# Patient Record
Sex: Male | Born: 1960 | Race: Black or African American | Hispanic: No | Marital: Single | Smoking: Current some day smoker
Health system: Southern US, Community
[De-identification: ages and names within clinical notes are randomized; demographics above are authoritative.]

## PROBLEM LIST (undated history)

## (undated) DIAGNOSIS — K219 Gastro-esophageal reflux disease without esophagitis: Secondary | ICD-10-CM

## (undated) DIAGNOSIS — Q2112 Patent foramen ovale: Secondary | ICD-10-CM

## (undated) DIAGNOSIS — J449 Chronic obstructive pulmonary disease, unspecified: Secondary | ICD-10-CM

## (undated) DIAGNOSIS — R296 Repeated falls: Secondary | ICD-10-CM

## (undated) DIAGNOSIS — F329 Major depressive disorder, single episode, unspecified: Secondary | ICD-10-CM

## (undated) DIAGNOSIS — M51369 Other intervertebral disc degeneration, lumbar region without mention of lumbar back pain or lower extremity pain: Secondary | ICD-10-CM

## (undated) DIAGNOSIS — M7122 Synovial cyst of popliteal space [Baker], left knee: Secondary | ICD-10-CM

## (undated) DIAGNOSIS — R768 Other specified abnormal immunological findings in serum: Secondary | ICD-10-CM

## (undated) DIAGNOSIS — R4182 Altered mental status, unspecified: Secondary | ICD-10-CM

## (undated) DIAGNOSIS — N281 Cyst of kidney, acquired: Secondary | ICD-10-CM

## (undated) DIAGNOSIS — G40909 Epilepsy, unspecified, not intractable, without status epilepticus: Secondary | ICD-10-CM

## (undated) DIAGNOSIS — D696 Thrombocytopenia, unspecified: Secondary | ICD-10-CM

## (undated) DIAGNOSIS — B1920 Unspecified viral hepatitis C without hepatic coma: Secondary | ICD-10-CM

## (undated) DIAGNOSIS — E039 Hypothyroidism, unspecified: Secondary | ICD-10-CM

## (undated) DIAGNOSIS — Z720 Tobacco use: Secondary | ICD-10-CM

## (undated) DIAGNOSIS — Q211 Atrial septal defect: Secondary | ICD-10-CM

## (undated) DIAGNOSIS — I63512 Cerebral infarction due to unspecified occlusion or stenosis of left middle cerebral artery: Secondary | ICD-10-CM

## (undated) DIAGNOSIS — Z9049 Acquired absence of other specified parts of digestive tract: Secondary | ICD-10-CM

## (undated) DIAGNOSIS — N2889 Other specified disorders of kidney and ureter: Secondary | ICD-10-CM

## (undated) DIAGNOSIS — Q909 Down syndrome, unspecified: Secondary | ICD-10-CM

## (undated) DIAGNOSIS — Z9889 Other specified postprocedural states: Secondary | ICD-10-CM

## (undated) DIAGNOSIS — I253 Aneurysm of heart: Secondary | ICD-10-CM

## (undated) DIAGNOSIS — M47816 Spondylosis without myelopathy or radiculopathy, lumbar region: Secondary | ICD-10-CM

## (undated) DIAGNOSIS — E559 Vitamin D deficiency, unspecified: Secondary | ICD-10-CM

## (undated) DIAGNOSIS — M5136 Other intervertebral disc degeneration, lumbar region: Secondary | ICD-10-CM

## (undated) DIAGNOSIS — R112 Nausea with vomiting, unspecified: Secondary | ICD-10-CM

## (undated) HISTORY — DX: Other specified abnormal immunological findings in serum: R76.8

## (undated) HISTORY — DX: Aneurysm of heart: I25.3

## (undated) HISTORY — DX: Atrial septal defect: Q21.1

## (undated) HISTORY — DX: Chronic obstructive pulmonary disease, unspecified: J44.9

## (undated) HISTORY — DX: Altered mental status, unspecified: R41.82

## (undated) HISTORY — DX: Vitamin D deficiency, unspecified: E55.9

## (undated) HISTORY — DX: Major depressive disorder, single episode, unspecified: F32.9

## (undated) HISTORY — DX: Repeated falls: R29.6

## (undated) HISTORY — DX: Acquired absence of other specified parts of digestive tract: Z90.49

## (undated) HISTORY — DX: Down syndrome, unspecified: Q90.9

## (undated) HISTORY — DX: Patent foramen ovale: Q21.12

## (undated) HISTORY — DX: Tobacco use: Z72.0

## (undated) HISTORY — DX: Epilepsy, unspecified, not intractable, without status epilepticus: G40.909

## (undated) HISTORY — DX: Cerebral infarction due to unspecified occlusion or stenosis of left middle cerebral artery: I63.512

## (undated) HISTORY — DX: Cyst of kidney, acquired: N28.1

## (undated) HISTORY — DX: Hypothyroidism, unspecified: E03.9

## (undated) HISTORY — PX: LAPAROSCOPIC CHOLECYSTECTOMY: SUR755

---

## 2012-02-03 ENCOUNTER — Ambulatory Visit: Payer: Self-pay | Admitting: Emergency Medicine

## 2012-02-03 LAB — HEPATIC FUNCTION PANEL A (ARMC)
Albumin: 3.4 g/dL (ref 3.4–5.0)
Bilirubin, Direct: <0.1 mg/dL (ref 0.00–0.20)
SGOT(AST): 75 U/L — ABNORMAL HIGH (ref 15–37)

## 2012-02-06 ENCOUNTER — Inpatient Hospital Stay: Payer: Self-pay | Admitting: Emergency Medicine

## 2012-02-12 LAB — PATHOLOGY REPORT

## 2014-04-18 ENCOUNTER — Ambulatory Visit: Payer: Self-pay | Admitting: Gastroenterology

## 2014-11-08 ENCOUNTER — Ambulatory Visit: Payer: Self-pay | Admitting: Gastroenterology

## 2015-01-07 NOTE — Op Note (Signed)
PATIENT NAME:  MASSEY, RUHLAND MR#:  [id number] DATE OF BIRTH:  11-11-1960  DATE OF PROCEDURE:  02/05/2012  PREOPERATIVE DIAGNOSIS: Acute cholecystitis.  POSTOPERATIVE DIAGNOSIS: Acute cholecystitis.  PROCEDURE: Laparoscopic cholecystectomy.   HISTORY: This patient was seen by me because he was having discomfort and bloating in the right upper quadrant. The patient had an ultrasound which showed a very large stone in the gallbladder. The patient also is from a group home and taking a lot of antipsychotic medications. He was kind of an obese gentleman.  Then he was brought to surgery.  DESCRIPTION OF PROCEDURE: Under general anesthesia, the abdomen was then prepped and draped. A small incision was made above the umbilicus. After cutting skin and subcutaneous tissue, the fascia was cut and the abdomen was entered with a Hassan trocar. Another trocar was put in the epigastrium and two 5 mm were put in the right upper quadrant of the abdomen. Initial inspection revealed the gallbladder was small and the liver was enlarged. There was omentum on the surface of the gallbladder. When we lifted up the gallbladder, it would not lift up completely from the bed of the liver so we applied a clamp at the Hartmann's pouch area to lift up. I saw some blood in the abdomen and then under the liver bed area, although we did not hit anything and there might be adhesions of omentum somewhere, but I could not see anything. I suctioned and washed it out. It seemed to stop after a while. It was a difficult gallbladder because of his size and because the gallbladder was long and it would not come off the surface of the liver. Dissection was done very nicely. The cystic artery was then clipped and cut. The cystic duct was then isolated twice. I tried to do a cholangiogram but there was a hole in the gallbladder wall. I attempted twice and dye would leak out so I would not see anything. I saw a very large stone. Dissection was done  very nicely until I could see the cystic duct very nicely. It was then clipped and divided. The gallbladder was then lifted up from the liver bed and bleeding was stopped from the liver bed completely. After we went out we washed out everything and tried to find out where the initial bleeding came from because I did not see any obvious bleeding coming from the liver or coming from the gallbladder. I do not know whether it was from the omentum. I looked many, many times. I washed it out many, many times. The bed looked dry after I finished the surgery. I suctioned out but still put a JP drain in to see whether she bleeds any postoperative. So I put a JP drain from one of the ports and brought out from the lateral port and then sutured to the skin. After that I checked all the trocars which were okay. The umbilical port was then closed with interrupted 0 Vicryl sutures. Staples were applied. The patient tolerated the procedure well and was sent to the recovery room in satisfactory condition.  ____________________________ Welford Roche Phylis Bougie, MD msh:slb D: 02/05/2012 10:37:54 ET T: 02/05/2012 14:24:44 ET JOB#: [id number]  cc: Karigan Cloninger S. Phylis Bougie, MD, <Dictator> Sharene Butters MD ELECTRONICALLY SIGNED 02/06/2012 8:51

## 2015-01-07 NOTE — Discharge Summary (Signed)
PATIENT NAME:  Mario Liu, Mario Liu MR#:  [id number] DATE OF BIRTH:  Nov 25, 1960  DATE OF ADMISSION:  02/06/2012 DATE OF DISCHARGE:  02/07/2012  HISTORY/ HOSPITAL COURSE: This patient was admitted to this hospital on 02/05/2012 after surgery because he had some bleeding from the liver edge and I put a drain in him. He really stopped bleeding after the next day and was doing fine but still there was a lot of  serosanguineous drainage from the drain so I decided to keep him another day. He was then discharged from the hospital on 02/07/2012 without any problems.  His hospital course was satisfactory. The JP was taken out.  He was put on medication Percocet for pain, 5/325 1 tablet q. 4-6 h. p.r.n. for pain. He will be followed in my office to take the stitches out.    ____________________________ Welford Roche. Phylis Bougie, MD msh:bjt D: 02/07/2012 14:49:17 ET T: 02/09/2012 12:33:09 ET JOB#: [id number]  cc: Oretha Weismann S. Phylis Bougie, MD, <Dictator> Sharene Butters MD ELECTRONICALLY SIGNED 02/12/2012 12:31

## 2016-09-15 DIAGNOSIS — M7122 Synovial cyst of popliteal space [Baker], left knee: Secondary | ICD-10-CM

## 2016-09-15 HISTORY — DX: Synovial cyst of popliteal space (Baker), left knee: M71.22

## 2017-02-06 ENCOUNTER — Other Ambulatory Visit: Payer: Self-pay | Admitting: Student

## 2017-02-06 DIAGNOSIS — B182 Chronic viral hepatitis C: Secondary | ICD-10-CM

## 2017-02-12 ENCOUNTER — Ambulatory Visit: Admission: RE | Admit: 2017-02-12 | Payer: Medicare Other | Source: Ambulatory Visit

## 2017-04-15 ENCOUNTER — Inpatient Hospital Stay: Payer: Medicare Other | Admitting: Oncology

## 2017-04-20 ENCOUNTER — Inpatient Hospital Stay: Payer: Medicare Other | Attending: Oncology | Admitting: Oncology

## 2017-04-20 ENCOUNTER — Inpatient Hospital Stay: Payer: Medicare Other

## 2017-04-20 ENCOUNTER — Encounter: Payer: Self-pay | Admitting: Oncology

## 2017-04-20 ENCOUNTER — Other Ambulatory Visit: Payer: Self-pay

## 2017-04-20 VITALS — BP 96/67 | HR 86 | Temp 96.9°F | Resp 20 | Wt 219.0 lb

## 2017-04-20 DIAGNOSIS — F329 Major depressive disorder, single episode, unspecified: Secondary | ICD-10-CM | POA: Diagnosis not present

## 2017-04-20 DIAGNOSIS — N281 Cyst of kidney, acquired: Secondary | ICD-10-CM | POA: Diagnosis not present

## 2017-04-20 DIAGNOSIS — E559 Vitamin D deficiency, unspecified: Secondary | ICD-10-CM | POA: Insufficient documentation

## 2017-04-20 DIAGNOSIS — Z87891 Personal history of nicotine dependence: Secondary | ICD-10-CM

## 2017-04-20 DIAGNOSIS — D649 Anemia, unspecified: Secondary | ICD-10-CM | POA: Diagnosis not present

## 2017-04-20 DIAGNOSIS — D696 Thrombocytopenia, unspecified: Secondary | ICD-10-CM

## 2017-04-20 DIAGNOSIS — E039 Hypothyroidism, unspecified: Secondary | ICD-10-CM | POA: Diagnosis not present

## 2017-04-20 DIAGNOSIS — Z79899 Other long term (current) drug therapy: Secondary | ICD-10-CM | POA: Diagnosis not present

## 2017-04-20 DIAGNOSIS — J449 Chronic obstructive pulmonary disease, unspecified: Secondary | ICD-10-CM

## 2017-04-20 DIAGNOSIS — Z87898 Personal history of other specified conditions: Secondary | ICD-10-CM

## 2017-04-20 LAB — CBC WITH DIFFERENTIAL/PLATELET
BASOS ABS: 0 10*3/uL (ref 0–0.1)
BASOS PCT: 0 %
EOS ABS: 0.3 10*3/uL (ref 0–0.7)
Eosinophils Relative: 6 %
HCT: 37.1 % — ABNORMAL LOW (ref 40.0–52.0)
HEMOGLOBIN: 12.6 g/dL — AB (ref 13.0–18.0)
Lymphocytes Relative: 46 %
Lymphs Abs: 2.1 10*3/uL (ref 1.0–3.6)
MCH: 28.5 pg (ref 26.0–34.0)
MCHC: 33.9 g/dL (ref 32.0–36.0)
MCV: 84.3 fL (ref 80.0–100.0)
MONOS PCT: 12 %
Monocytes Absolute: 0.6 10*3/uL (ref 0.2–1.0)
NEUTROS PCT: 36 %
Neutro Abs: 1.7 10*3/uL (ref 1.4–6.5)
Platelets: 128 10*3/uL — ABNORMAL LOW (ref 150–440)
RBC: 4.4 MIL/uL (ref 4.40–5.90)
RDW: 12.9 % (ref 11.5–14.5)
WBC: 4.7 10*3/uL (ref 3.8–10.6)

## 2017-04-20 LAB — COMPREHENSIVE METABOLIC PANEL
ALBUMIN: 3.7 g/dL (ref 3.5–5.0)
ALK PHOS: 50 U/L (ref 38–126)
ALT: 21 U/L (ref 17–63)
AST: 26 U/L (ref 15–41)
Anion gap: 7 (ref 5–15)
BILIRUBIN TOTAL: 0.4 mg/dL (ref 0.3–1.2)
BUN: 18 mg/dL (ref 6–20)
CALCIUM: 9.2 mg/dL (ref 8.9–10.3)
CO2: 28 mmol/L (ref 22–32)
CREATININE: 1.19 mg/dL (ref 0.61–1.24)
Chloride: 100 mmol/L — ABNORMAL LOW (ref 101–111)
GFR calc non Af Amer: >60 mL/min (ref >60)
GLUCOSE: 114 mg/dL — AB (ref 65–99)
Potassium: 3.6 mmol/L (ref 3.5–5.1)
SODIUM: 135 mmol/L (ref 135–145)
TOTAL PROTEIN: 7.6 g/dL (ref 6.5–8.1)

## 2017-04-20 LAB — LACTATE DEHYDROGENASE: LDH: 185 U/L (ref 98–192)

## 2017-04-20 LAB — VITAMIN B12: VITAMIN B 12: 392 pg/mL (ref 180–914)

## 2017-04-20 LAB — PATHOLOGIST SMEAR REVIEW

## 2017-04-20 LAB — FOLATE: FOLATE: 11 ng/mL (ref >5.9)

## 2017-04-20 NOTE — Progress Notes (Signed)
[facility] Initial Visit:  Patient Care Team: [name], FNP as PCP - General (Family Medicine)  CHIEF COMPLAINTS/PURPOSE OF CONSULTATION: Low platelets  HISTORY OF PRESENTING ILLNESS: Mario Liu 56 y.o. male is here for evaluation of low platelet count. The patient lives in a group home and is accompanied by caregiver from group home. He is referred to [place] for evaluation of thrombocytopenia. He had labs on with primary care provider on December 15 2016 which showed WBC 5.3 hemoglobin 14.3 MCV 88 RDW 13.8 MPV 11.37 platelet 111 lymphocyte 46.69% absolute lymphocytes 2.5 normal is BMPnormal LFT. It was noted that hepatitis C antibody reactive. Patient was seen by [place] group for workupof her positive hepatitis C antibody Further tests including HCV RNA by PCR was negative.  Hep B core antibody negative, Hb surface antibody nonreactive,A antibody positive,Hep A IgM negative. So that patient did not really have hepatitis C, additional ultrasound and fibrocystic was canceled by GI.Marland Kitchen Patient reports feeling well, good energy level, good appetite, denies any weight loss fatigue, fever or chills. Patient is a poor historian. He told me that he used to drink liquor but haven't drink lately.Denies any recent infection, bleeding, surgery.   Review of Systems  Constitutional: Negative.   HENT:  Negative.   Eyes: Negative.   Respiratory: Negative.   Cardiovascular: Negative.   Gastrointestinal: Negative.   Endocrine: Negative.   Genitourinary: Negative.    Musculoskeletal: Negative.   Skin: Negative.   Neurological: Negative.   Hematological: Negative.   Psychiatric/Behavioral: Negative.     MEDICAL HISTORY: History reviewed. No pertinent past medical history.  SURGICAL HISTORY: History reviewed. No pertinent surgical history.  SOCIAL HISTORY: Social History   Social History  . Marital status: Single    Spouse name: N/A  . Number of children: N/A  .  Years of education: N/A   Occupational History  . Not on file.   Social History Main Topics  . Smoking status: Not on file  . Smokeless tobacco: Not on file  . Alcohol use Not on file  . Drug use: Unknown  . Sexual activity: Not on file   Other Topics Concern  . Not on file   Social History Narrative  . No narrative on file    FAMILY HISTORY History reviewed. No pertinent family history.  ALLERGIES:  has No Known Allergies.  MEDICATIONS:  Current Outpatient Prescriptions  Medication Sig Dispense Refill  . alendronate (FOSAMAX) 70 MG tablet Take by mouth.    Marland Kitchen buPROPion (WELLBUTRIN XL) 150 MG 24 hr tablet Take by mouth.    . calcium carbonate (OS-CAL) 600 MG TABS tablet Take by mouth.    . cetirizine (ZYRTEC) 10 MG tablet Take 10 mg by mouth daily.    . Cholecalciferol (VITAMIN D-1000 MAX ST) 1000 units tablet Take 1,000 mg by mouth.    . citalopram (CELEXA) 20 MG tablet Take 40 mg by mouth.    . clonazePAM (KLONOPIN) 0.5 MG tablet Take 0.5 mg by mouth daily.    . fluticasone (FLONASE) 50 MCG/ACT nasal spray Place into the nose.    . furosemide (LASIX) 40 MG tablet Take 40 mg by mouth daily.    . meloxicam (MOBIC) 15 MG tablet Take 15 mg by mouth daily.    . potassium chloride (K-DUR) 10 MEQ tablet Take by mouth.    . risperiDONE (RISPERDAL) 1 MG tablet Take 3 mg by mouth 2 (two) times daily.    . traZODone (  DESYREL) 50 MG tablet Take 50 mg by mouth daily.     No current facility-administered medications for this visit.     PHYSICAL EXAMINATION:  ECOG PERFORMANCE STATUS: 0 - Asymptomatic   Vitals:   04/20/17 1207  BP: 96/67  Pulse: 86  Resp: 20  Temp: (!) 96.9 F (36.1 C)    Filed Weights   04/20/17 1207  Weight: 219 lb (99.3 kg)     Physical Exam GENERAL: No distress, well nourished.  SKIN:  No rashes or significant lesions  HEAD: Normocephalic, No masses, lesions, tenderness or abnormalities  EYES: Conjunctiva are pink, non icteric ENT: External  ears normal ,lips , buccal mucosa, and tongue normal and mucous membranes are moist  LYMPH: No palpable cervical and axillary lymphadenopathy  LUNGS: Clear to auscultation, no crackles or wheezes HEART: Regular rate & rhythm, no murmurs, no gallops, S1 normal and S2 normal  ABDOMEN: obese abdomen wall. Abdomen soft, non-tender, normal bowel sounds, I did not appreciate any  masses or organomegaly  MUSCULOSKELETAL: No CVA tenderness and no tenderness on percussion of the back or rib cage.  EXTREMITIES: No edema, no skin discoloration or tenderness NEURO: Alert & oriented, no focal motor/sensory deficits.    LABORATORY DATA: I have personally reviewed the data as listed: He had labs on with primary care provider on December 15 2016 which showed WBC 5.3 hemoglobin 14.3 MCV 88 RDW 13.8 MPV 11.37 platelet 111 lymphocyte 46.69% absolute lymphocytes 2.5 normal is BMPnormal LFT. It was noted that hepatitis C antibody reactive. Patient was seen by [facility] group for workupof her positive hepatitis C antibody Further tests including HCV RNA by PCR was negative.  Hep B core antibody negative, Hb surface antibody nonreactive,A antibody positive,Hep A IgM negative.   RADIOGRAPHIC STUDIES: I have personally reviewed the radiological images as listed and agree with the findings in the report  No results found.  ASSESSMENT/PLAN 1. Thrombocytopenia ([place])   2. History of alcohol consumption    For the work up of patient's thrombocytopenia, I recommend checking CBC;CMP, LDH; pathology smear review, folate, Vitamin B12, LDH. Will also check ultrasound of the abdomen. Medication was reviewed. Risperidone can potentially cause thrombocytopenia too.  Also, discussed with the patient that if no clear etiology found- bone marrow biopsy would be suggested. Currently await for the above workup.  # Patient follow-up with me a few days after lab and images.   Orders Placed This Encounter  Procedures  . US  Abdomen Limited    Check liver and spleen,    Standing Status:   Future    Standing Expiration Date:   04/20/2018    Order Specific Question:   Reason for Exam (SYMPTOM  OR DIAGNOSIS REQUIRED)    Answer:   assess for splenomegaly, thrombocytopenia    Order Specific Question:   Preferred imaging location?    Answer:   [facility]  . ANA w/Reflex    Standing Status:   Future    Standing Expiration Date:   04/20/2018  . Vitamin B12    Standing Status:   Future    Standing Expiration Date:   04/20/2018  . Folic Acid (Folate)    Standing Status:   Future    Standing Expiration Date:   04/20/2018  . CBC with Differential/Platelet    Standing Status:   Future    Standing Expiration Date:   04/20/2018  . Comprehensive metabolic panel    Standing Status:   Future  Standing Expiration Date:   04/20/2018  . Lactate dehydrogenase    Standing Status:   Future    Standing Expiration Date:   04/20/2018  . Pathologist smear review  . Comp panel: Leukemia/Lymphoma    All questions were answered. The patient knows to call the clinic with any problems, questions or concerns. Thank you for this kind referral and the opportunity to participate in the care of this patient. A copy of today's note is routed to referring provider Marrianne Mood. Earlie Server, MD  04/20/2017 12:04 PM

## 2017-04-20 NOTE — Progress Notes (Signed)
Patient here today as a new patient for thrombocytopenia

## 2017-04-21 LAB — ANA W/REFLEX: ANA: NEGATIVE

## 2017-04-23 ENCOUNTER — Ambulatory Visit
Admission: RE | Admit: 2017-04-23 | Discharge: 2017-04-23 | Disposition: A | Discharge status: Home / Self Care | Payer: Medicare Other | Source: Ambulatory Visit | Attending: Oncology | Admitting: Oncology

## 2017-04-23 DIAGNOSIS — N281 Cyst of kidney, acquired: Secondary | ICD-10-CM | POA: Insufficient documentation

## 2017-04-23 DIAGNOSIS — D696 Thrombocytopenia, unspecified: Secondary | ICD-10-CM

## 2017-04-23 LAB — COMP PANEL: LEUKEMIA/LYMPHOMA

## 2017-04-27 ENCOUNTER — Encounter: Payer: Self-pay | Admitting: Oncology

## 2017-04-27 ENCOUNTER — Inpatient Hospital Stay: Payer: Medicare Other

## 2017-04-27 ENCOUNTER — Inpatient Hospital Stay (HOSPITAL_BASED_OUTPATIENT_CLINIC_OR_DEPARTMENT_OTHER): Payer: Medicare Other | Admitting: Oncology

## 2017-04-27 VITALS — BP 90/57 | HR 92 | Temp 96.4°F | Resp 20 | Wt 223.3 lb

## 2017-04-27 DIAGNOSIS — D649 Anemia, unspecified: Secondary | ICD-10-CM

## 2017-04-27 DIAGNOSIS — D696 Thrombocytopenia, unspecified: Secondary | ICD-10-CM | POA: Diagnosis not present

## 2017-04-27 DIAGNOSIS — Z79899 Other long term (current) drug therapy: Secondary | ICD-10-CM | POA: Diagnosis not present

## 2017-04-27 LAB — IRON AND TIBC
Iron: 70 ug/dL (ref 45–182)
SATURATION RATIOS: 28 % (ref 17.9–39.5)
TIBC: 250 ug/dL (ref 250–450)
UIBC: 180 ug/dL

## 2017-04-27 LAB — FERRITIN: Ferritin: 130 ng/mL (ref 24–336)

## 2017-04-27 NOTE — Progress Notes (Signed)
Patient here today for follow up.   

## 2017-04-27 NOTE — Progress Notes (Signed)
[facility] Initial Visit:  Patient Care Team: [name], FNP as PCP - General (Family Medicine)  CHIEF COMPLAINTS/PURPOSE OF CONSULTATION: Low platelets  HISTORY OF PRESENTING ILLNESS: Mario Liu 56 y.o. male is here for evaluation of low platelet count. The patient lives in a group home and is accompanied by caregiver from group home. He is referred to [place] for evaluation of thrombocytopenia. He had labs on with primary care provider on December 15 2016 which showed WBC 5.3 hemoglobin 14.3 MCV 88 RDW 13.8 MPV 11.37 platelet 111 lymphocyte 46.69% absolute lymphocytes 2.5 normal is BMPnormal LFT. It was noted that hepatitis C antibody reactive. Patient was seen by [facility] group for workupof her positive hepatitis C antibody Further tests including HCV RNA by PCR was negative.  Hep B core antibody negative, Hb surface antibody nonreactive,A antibody positive,Hep A IgM negative. So that patient did not really have hepatitis C, additional ultrasound and fibrocystic was canceled by GI.Marland Kitchen Patient reports feeling well, good energy level, good appetite, denies any weight loss fatigue, fever or chills. Patient is a poor historian. He told me that he used to drink liquor but haven't drink lately.Denies any recent infection, bleeding, surgery.   INTERVAL HISTORY Patient presents to clinic for discussion of his labs. He has no new complaints.  Review of Systems  Constitutional: Negative.   HENT:  Negative.   Eyes: Negative.   Respiratory: Negative.   Cardiovascular: Negative.   Gastrointestinal: Negative.   Endocrine: Negative.   Genitourinary: Negative.    Musculoskeletal: Negative.   Skin: Negative.   Neurological: Negative.   Hematological: Negative.   Psychiatric/Behavioral: Negative.     MEDICAL HISTORY: Past Medical History:  Diagnosis Date  . COPD (chronic obstructive pulmonary disease) ([place])   . Hx of cholecystectomy   . Hypothyroidism   . Kidney cysts    . Major depressive disorder   . Tobacco abuse   . Vitamin D deficiency     SURGICAL HISTORY: No past surgical history on file.  SOCIAL HISTORY: Social History   Social History  . Marital status: Single    Spouse name: N/A  . Number of children: N/A  . Years of education: N/A   Occupational History  . Not on file.   Social History Main Topics  . Smoking status: Former Smoker    Packs/day: 1.00    Years: 39.00    Types: Cigarettes  . Smokeless tobacco: Never Used     Comment: Pt states he quit smoking 1 mth ago (03/20/17)   . Alcohol use No  . Drug use: No  . Sexual activity: Not on file   Other Topics Concern  . Not on file   Social History Narrative  . No narrative on file    FAMILY HISTORY No family history on file.  ALLERGIES:  has No Known Allergies.  MEDICATIONS:  Current Outpatient Prescriptions  Medication Sig Dispense Refill  . alendronate (FOSAMAX) 70 MG tablet Take by mouth.    Marland Kitchen buPROPion (WELLBUTRIN XL) 150 MG 24 hr tablet Take by mouth.    . calcium carbonate (OS-CAL) 600 MG TABS tablet Take by mouth.    . cetirizine (ZYRTEC) 10 MG tablet Take 10 mg by mouth daily.    . Cholecalciferol (VITAMIN D-1000 MAX ST) 1000 units tablet Take 1,000 mg by mouth.    . citalopram (CELEXA) 20 MG tablet Take 40 mg by mouth.    . clonazePAM (KLONOPIN) 0.5 MG tablet Take 0.5  mg by mouth daily.    . fluticasone (FLONASE) 50 MCG/ACT nasal spray Place into the nose.    . furosemide (LASIX) 40 MG tablet Take 40 mg by mouth daily.    . meloxicam (MOBIC) 15 MG tablet Take 15 mg by mouth daily.    . potassium chloride (K-DUR) 10 MEQ tablet Take by mouth.    . risperiDONE (RISPERDAL) 1 MG tablet Take 3 mg by mouth 2 (two) times daily.    . traZODone (DESYREL) 50 MG tablet Take 50 mg by mouth daily.     No current facility-administered medications for this visit.     PHYSICAL EXAMINATION:  ECOG PERFORMANCE STATUS: 0 - Asymptomatic   Vitals:   04/27/17 1115  BP:  (!) 90/57  Pulse: 92  Resp: 20  Temp: (!) 96.4 F (35.8 C)    Filed Weights   04/27/17 1115  Weight: 223 lb 5 oz (101.3 kg)     Physical Exam GENERAL: No distress, well nourished.  SKIN:  No rashes or significant lesions  HEAD: Normocephalic, No masses, lesions, tenderness or abnormalities  EYES: Conjunctiva are pink, non icteric ENT: External ears normal ,lips , buccal mucosa, and tongue normal and mucous membranes are moist  LYMPH: No palpable cervical and axillary lymphadenopathy  LUNGS: Clear to auscultation, no crackles or wheezes HEART: Regular rate & rhythm, no murmurs, no gallops, S1 normal and S2 normal  ABDOMEN: obese abdomen wall. Abdomen soft, non-tender, normal bowel sounds, I did not appreciate any  masses or organomegaly  MUSCULOSKELETAL: No CVA tenderness and no tenderness on percussion of the back or rib cage.  EXTREMITIES: No edema, no skin discoloration or tenderness NEURO: Alert & oriented, no focal motor/sensory deficits.    LABORATORY DATA: I have personally reviewed the data as listed: He had labs on with primary care provider on December 15 2016 which showed WBC 5.3 hemoglobin 14.3 MCV 88 RDW 13.8 MPV 11.37 platelet 111 lymphocyte 46.69% absolute lymphocytes 2.5 normal is BMPnormal LFT. It was noted that hepatitis C antibody reactive. Patient was seen by [facility] group for workupof her positive hepatitis C antibody Further tests including HCV RNA by PCR was negative.  Hep B core antibody negative, Hb surface antibody nonreactive,A antibody positive,Hep A IgM negative. CBC    Component Value Date/Time   WBC 4.7 04/20/2017 1221   RBC 4.40 04/20/2017 1221   HGB 12.6 (L) 04/20/2017 1221   HCT 37.1 (L) 04/20/2017 1221   PLT 128 (L) 04/20/2017 1221   MCV 84.3 04/20/2017 1221   MCH 28.5 04/20/2017 1221   MCHC 33.9 04/20/2017 1221   RDW 12.9 04/20/2017 1221   LYMPHSABS 2.1 04/20/2017 1221   MONOABS 0.6 04/20/2017 1221   EOSABS 0.3 04/20/2017 1221    BASOSABS 0.0 04/20/2017 1221   CMP Latest Ref Rng & Units 04/20/2017 02/03/2012  Glucose 65 - 99 mg/dL 114(H) -  BUN 6 - 20 mg/dL 18 -  Creatinine 0.61 - 1.24 mg/dL 1.19 -  Sodium 135 - 145 mmol/L 135 -  Potassium 3.5 - 5.1 mmol/L 3.6 -  Chloride 101 - 111 mmol/L 100(L) -  CO2 22 - 32 mmol/L 28 -  Calcium 8.9 - 10.3 mg/dL 9.2 -  Total Protein 6.5 - 8.1 g/dL 7.6 7.6  Total Bilirubin 0.3 - 1.2 mg/dL 0.4 0.6  Alkaline Phos 38 - 126 U/L 50 59  AST 15 - 41 U/L 26 75(H)  ALT 17 - 63 U/L 21 70  Results for Tenpas,  AVYUKTH (MRN [id number]) as of 04/27/2017 11:09  Ref. Range 04/20/2017 12:10 04/20/2017 12:21  Folate Latest Ref Range: >5.9 ng/mL  11.0  Vitamin B12 Latest Ref Range: 180 - 914 pg/mL 392    Results for BRYDEN, DARDEN (MRN [id number]) as of 04/27/2017 11:09  Ref. Range 04/20/2017 12:21  LDH Latest Ref Range: 98 - 192 U/L 185   Peripheral lab flow cytometry No monoclonal B cell population is detected. kappa:lambda ratio 1.8  There is no loss of, or aberrant expression of, the pan T cell  antigens to  suggest a neoplastic T cell process.  CD4:CD8 ratio 2.5  No circulating blasts are detected. There is no immunophenotypic  evidence  of abnormal myeloid maturation.  Analysis of the leukocyte population shows: granulocytes 50%,  monocytes  10%, lymphocytes 40%, blasts <0.5%, B cells 8%, T cells 30%, NK cells  2%.    RADIOGRAPHIC STUDIES: I have personally reviewed the radiological images as listed and agree with the findings in the report US abdomen 04/23/2017 FINDINGS: Gallbladder: Surgically absent.Common bile duct: Diameter: 3 mm. No intrahepatic, common hepatic, common bile duct dilatation.Liver: No focal lesion identified. Within normal limits inparench ymal echogenicity. IVC: No abnormality visualized in areas which can be interrogated.Portions of the infrahepatic inferior vena cava are obscured by gas. Pancreas: Pancreas essentially completely obscured by gas. Spleen: Size and  appearance within normal limits. Spleen measures 9.1 x 10.0 x 3.9 cm with a measured splenic volume of 186 cubic cm. Right Kidney: Length: 10.8 cm. Echogenicity within normal limits. No hydronephrosis visualized. There is a mildly complex cystic structure in the lower pole the right kidney measuring 4.4 x 2.3 x 2.8 cm, similar to prior study. There is a nearby second mildly complex cystic structure toward the mid right kidney measuring 1.4 x1.4 x 1.7 cm, essentially stable from prior study. There is a cyst in the midportion of the kidney measuring 2.7 x 2.4 x 2.7 cm. No newrenal lesions evident. Left Kidney: Length: 11.5 cm. Echogenicity within normal limits. No hydronephrosis visualized. There is a simple cyst in the mid left kidney measuring 0.9 x 1.0 x 1.1 cm. Abdominal aorta: No aneurysm visualized in areas which can be interrogated. Portions of the aorta are obscured by gas. Other findings: No demonstrable ascites.  IMPRESSION:1.  No liver or splenic lesions.2.  Gallbladder absent.3. Stable mildly complex cystic areas in the right kidney. No new renal lesion identified on the right. Small simple cyst mid left kidney.4. Pancreas essentially completely obscured by gas. Portions of inferior vena cava and aorta obscured by gas. Visualized portions ofthese structures appear unremarkable.  ASSESSMENT/PLAN 1. Thrombocytopenia ([place])   2. Normocytic anemia    CBC CMP, LDH; pathology smear review, folate, Vitamin B12, LDH result reviewed. ultrasound of the abdomen showed no enlargement of spleen . Medication was reviewed. Risperidone can potentially cause thrombocytopenia. Mild normocytic anemia which is new comparing to his last lab results with [facility]. Check iron tibc ferritin.  I discussed with patient that so far the work up is non conclusive for the etiology of his thrombocytopenia. Underlying bone marrow disorder such as MDS can not be excluded.  marrow biopsy would be suggested.  Discussed with patient that either a bone marrow biopsy now or follow up in 3 months with repeat tests and decide then. Patient opts to follow up in 3 months and decide at that time.  # Patient follow-up with me in 3 months with labs done 1 day before visit.  Orders Placed This Encounter  Procedures  . CBC with Differential/Platelet    Standing Status:   Future    Standing Expiration Date:   04/27/2018  . Comprehensive metabolic panel    Standing Status:   Future    Standing Expiration Date:   04/27/2018  . Iron and TIBC    Standing Status:   Future    Standing Expiration Date:   04/27/2018  . Ferritin    Standing Status:   Future    Standing Expiration Date:   04/27/2018    All questions were answered. The patient knows to call the clinic with any problems, questions or concerns. Thank you for this kind referral and the opportunity to participate in the care of this patient. A copy of today's note is routed to referring provider Marrianne Mood. Earlie Server, MD  04/27/2017 11:08 AM

## 2017-05-14 ENCOUNTER — Encounter: Payer: Self-pay | Admitting: Emergency Medicine

## 2017-05-14 ENCOUNTER — Emergency Department (HOSPITAL_COMMUNITY): Payer: Medicare Other

## 2017-05-14 ENCOUNTER — Emergency Department: Payer: Medicare Other

## 2017-05-14 ENCOUNTER — Inpatient Hospital Stay (HOSPITAL_COMMUNITY)
Admission: EM | Admit: 2017-05-14 | Discharge: 2017-05-18 | DRG: 065 | Disposition: A | Discharge status: Rehabilitation Facility | Payer: Medicare Other | Attending: Internal Medicine | Admitting: Internal Medicine

## 2017-05-14 ENCOUNTER — Emergency Department
Admission: EM | Admit: 2017-05-14 | Discharge: 2017-05-14 | Disposition: A | Discharge status: Home / Self Care | Payer: Medicare Other | Attending: Emergency Medicine | Admitting: Emergency Medicine

## 2017-05-14 ENCOUNTER — Encounter (HOSPITAL_COMMUNITY): Payer: Self-pay

## 2017-05-14 DIAGNOSIS — E039 Hypothyroidism, unspecified: Secondary | ICD-10-CM | POA: Diagnosis present

## 2017-05-14 DIAGNOSIS — R29810 Facial weakness: Secondary | ICD-10-CM | POA: Diagnosis present

## 2017-05-14 DIAGNOSIS — Z87891 Personal history of nicotine dependence: Secondary | ICD-10-CM

## 2017-05-14 DIAGNOSIS — R471 Dysarthria and anarthria: Secondary | ICD-10-CM | POA: Diagnosis not present

## 2017-05-14 DIAGNOSIS — Z8619 Personal history of other infectious and parasitic diseases: Secondary | ICD-10-CM | POA: Diagnosis not present

## 2017-05-14 DIAGNOSIS — R768 Other specified abnormal immunological findings in serum: Secondary | ICD-10-CM | POA: Diagnosis not present

## 2017-05-14 DIAGNOSIS — J449 Chronic obstructive pulmonary disease, unspecified: Secondary | ICD-10-CM | POA: Diagnosis present

## 2017-05-14 DIAGNOSIS — R1310 Dysphagia, unspecified: Secondary | ICD-10-CM | POA: Diagnosis present

## 2017-05-14 DIAGNOSIS — F17200 Nicotine dependence, unspecified, uncomplicated: Secondary | ICD-10-CM | POA: Diagnosis not present

## 2017-05-14 DIAGNOSIS — I340 Nonrheumatic mitral (valve) insufficiency: Secondary | ICD-10-CM | POA: Diagnosis not present

## 2017-05-14 DIAGNOSIS — K5900 Constipation, unspecified: Secondary | ICD-10-CM | POA: Diagnosis present

## 2017-05-14 DIAGNOSIS — F09 Unspecified mental disorder due to known physiological condition: Secondary | ICD-10-CM | POA: Diagnosis present

## 2017-05-14 DIAGNOSIS — F329 Major depressive disorder, single episode, unspecified: Secondary | ICD-10-CM | POA: Diagnosis present

## 2017-05-14 DIAGNOSIS — R29715 NIHSS score 15: Secondary | ICD-10-CM | POA: Diagnosis present

## 2017-05-14 DIAGNOSIS — Q211 Atrial septal defect: Secondary | ICD-10-CM

## 2017-05-14 DIAGNOSIS — I253 Aneurysm of heart: Secondary | ICD-10-CM

## 2017-05-14 DIAGNOSIS — E559 Vitamin D deficiency, unspecified: Secondary | ICD-10-CM | POA: Diagnosis present

## 2017-05-14 DIAGNOSIS — I63512 Cerebral infarction due to unspecified occlusion or stenosis of left middle cerebral artery: Secondary | ICD-10-CM | POA: Diagnosis not present

## 2017-05-14 DIAGNOSIS — I63412 Cerebral infarction due to embolism of left middle cerebral artery: Secondary | ICD-10-CM | POA: Diagnosis present

## 2017-05-14 DIAGNOSIS — R4701 Aphasia: Secondary | ICD-10-CM | POA: Diagnosis present

## 2017-05-14 DIAGNOSIS — Q909 Down syndrome, unspecified: Secondary | ICD-10-CM | POA: Diagnosis not present

## 2017-05-14 DIAGNOSIS — I10 Essential (primary) hypertension: Secondary | ICD-10-CM | POA: Diagnosis present

## 2017-05-14 DIAGNOSIS — Z79899 Other long term (current) drug therapy: Secondary | ICD-10-CM | POA: Diagnosis not present

## 2017-05-14 DIAGNOSIS — R130 Aphagia: Secondary | ICD-10-CM | POA: Diagnosis present

## 2017-05-14 DIAGNOSIS — I639 Cerebral infarction, unspecified: Secondary | ICD-10-CM | POA: Insufficient documentation

## 2017-05-14 DIAGNOSIS — I69320 Aphasia following cerebral infarction: Secondary | ICD-10-CM | POA: Diagnosis not present

## 2017-05-14 DIAGNOSIS — Q2112 Patent foramen ovale: Secondary | ICD-10-CM

## 2017-05-14 LAB — I-STAT ARTERIAL BLOOD GAS, ED
ACID-BASE DEFICIT: 1 mmol/L (ref 0.0–2.0)
Bicarbonate: 23.2 mmol/L (ref 20.0–28.0)
O2 Saturation: 94 %
PO2 ART: 69 mmHg — AB (ref 83.0–108.0)
TCO2: 24 mmol/L (ref 22–32)
pCO2 arterial: 36.7 mmHg (ref 32.0–48.0)
pH, Arterial: 7.409 (ref 7.350–7.450)

## 2017-05-14 LAB — CBC
HEMATOCRIT: 40.7 % (ref 40.0–52.0)
Hemoglobin: 13.6 g/dL (ref 13.0–18.0)
MCH: 28.5 pg (ref 26.0–34.0)
MCHC: 33.4 g/dL (ref 32.0–36.0)
MCV: 85.3 fL (ref 80.0–100.0)
PLATELETS: 123 10*3/uL — AB (ref 150–440)
RBC: 4.77 MIL/uL (ref 4.40–5.90)
RDW: 12.8 % (ref 11.5–14.5)
WBC: 5.3 10*3/uL (ref 3.8–10.6)

## 2017-05-14 LAB — GLUCOSE, CAPILLARY: GLUCOSE-CAPILLARY: 124 mg/dL — AB (ref 65–99)

## 2017-05-14 LAB — PROTIME-INR
INR: 1.04
Prothrombin Time: 13.5 seconds (ref 11.4–15.2)

## 2017-05-14 LAB — URINALYSIS, ROUTINE W REFLEX MICROSCOPIC
BACTERIA UA: NONE SEEN
BILIRUBIN URINE: NEGATIVE
Glucose, UA: NEGATIVE mg/dL
Ketones, ur: NEGATIVE mg/dL
Leukocytes, UA: NEGATIVE
Nitrite: NEGATIVE
PH: 6 (ref 5.0–8.0)
Protein, ur: NEGATIVE mg/dL
SPECIFIC GRAVITY, URINE: 1.014 (ref 1.005–1.030)

## 2017-05-14 LAB — DIFFERENTIAL
BASOS ABS: 0 10*3/uL (ref 0–0.1)
BASOS PCT: 1 %
Eosinophils Absolute: 0.3 10*3/uL (ref 0–0.7)
Eosinophils Relative: 5 %
LYMPHS PCT: 42 %
Lymphs Abs: 2.3 10*3/uL (ref 1.0–3.6)
MONOS PCT: 11 %
Monocytes Absolute: 0.6 10*3/uL (ref 0.2–1.0)
NEUTROS ABS: 2.1 10*3/uL (ref 1.4–6.5)
Neutrophils Relative %: 41 %

## 2017-05-14 LAB — COMPREHENSIVE METABOLIC PANEL
ALK PHOS: 40 U/L (ref 38–126)
ALT: 18 U/L (ref 17–63)
AST: 33 U/L (ref 15–41)
Albumin: 3.9 g/dL (ref 3.5–5.0)
Anion gap: 6 (ref 5–15)
BUN: 17 mg/dL (ref 6–20)
CHLORIDE: 102 mmol/L (ref 101–111)
CO2: 26 mmol/L (ref 22–32)
CREATININE: 1.64 mg/dL — AB (ref 0.61–1.24)
Calcium: 9.3 mg/dL (ref 8.9–10.3)
GFR calc Af Amer: 53 mL/min — ABNORMAL LOW (ref >60)
GFR calc non Af Amer: 46 mL/min — ABNORMAL LOW (ref >60)
Glucose, Bld: 120 mg/dL — ABNORMAL HIGH (ref 65–99)
Potassium: 5 mmol/L (ref 3.5–5.1)
SODIUM: 134 mmol/L — AB (ref 135–145)
Total Bilirubin: 1 mg/dL (ref 0.3–1.2)
Total Protein: 8 g/dL (ref 6.5–8.1)

## 2017-05-14 LAB — I-STAT VENOUS BLOOD GAS, ED
Bicarbonate: 25.3 mmol/L (ref 20.0–28.0)
O2 Saturation: 75 %
PCO2 VEN: 44.6 mmHg (ref 44.0–60.0)
PH VEN: 7.363 (ref 7.250–7.430)
TCO2: 27 mmol/L (ref 22–32)
pO2, Ven: 42 mmHg (ref 32.0–45.0)

## 2017-05-14 LAB — URINE DRUG SCREEN, QUALITATIVE (ARMC ONLY)
AMPHETAMINES, UR SCREEN: NOT DETECTED
BARBITURATES, UR SCREEN: NOT DETECTED
Benzodiazepine, Ur Scrn: NOT DETECTED
COCAINE METABOLITE, UR ~~LOC~~: NOT DETECTED
Cannabinoid 50 Ng, Ur ~~LOC~~: NOT DETECTED
MDMA (Ecstasy)Ur Screen: NOT DETECTED
METHADONE SCREEN, URINE: NOT DETECTED
Opiate, Ur Screen: NOT DETECTED
Phencyclidine (PCP) Ur S: NOT DETECTED
TRICYCLIC, UR SCREEN: NOT DETECTED

## 2017-05-14 LAB — ETHANOL

## 2017-05-14 LAB — APTT: APTT: 28 s (ref 24–36)

## 2017-05-14 MED ORDER — ACETAMINOPHEN 325 MG PO TABS: 650.0000 mg | ORAL_TABLET | ORAL | Status: DC | PRN

## 2017-05-14 MED ORDER — ASPIRIN 325 MG PO TABS
325.0000 mg | ORAL_TABLET | Freq: Every day | ORAL | Status: DC
  Administered 2017-05-15 – 2017-05-18 (×4): 325 mg via ORAL
  Filled 2017-05-14 (×4): qty 1

## 2017-05-14 MED ORDER — STROKE: EARLY STAGES OF RECOVERY BOOK
Freq: Once | Status: AC
  Administered 2017-05-14: 1

## 2017-05-14 MED ORDER — IOPAMIDOL (ISOVUE-370) INJECTION 76%
INTRAVENOUS | Status: AC
  Filled 2017-05-14: qty 100

## 2017-05-14 MED ORDER — ASPIRIN 300 MG RE SUPP
300.0000 mg | Freq: Every day | RECTAL | Status: DC
  Administered 2017-05-14: 300 mg via RECTAL
  Filled 2017-05-14 (×2): qty 1

## 2017-05-14 MED ORDER — ACETAMINOPHEN 650 MG RE SUPP: 650.0000 mg | RECTAL | Status: DC | PRN

## 2017-05-14 MED ORDER — ACETAMINOPHEN 160 MG/5ML PO SOLN: 650.0000 mg | ORAL | Status: DC | PRN

## 2017-05-14 MED ORDER — ENOXAPARIN SODIUM 40 MG/0.4ML ~~LOC~~ SOLN
40.0000 mg | SUBCUTANEOUS | Status: DC
  Administered 2017-05-14 – 2017-05-17 (×4): 40 mg via SUBCUTANEOUS
  Filled 2017-05-14 (×4): qty 0.4

## 2017-05-14 MED ORDER — SODIUM CHLORIDE 0.9 % IV SOLN
INTRAVENOUS | Status: AC
  Administered 2017-05-14: 19:00:00 via INTRAVENOUS
  Administered 2017-05-15: 1000 mL via INTRAVENOUS

## 2017-05-14 NOTE — Progress Notes (Deleted)
Patient moving to Cath Lab. [place] escorted patients wife to special recovery room. [facility] offered emotional support.    05/14/17 9937  Clinical Encounter Type  Visited With Patient;Family;Health care provider  Visit Type Follow-up  Referral From Chaplain

## 2017-05-14 NOTE — ED Notes (Signed)
Attempted report 

## 2017-05-14 NOTE — ED Triage Notes (Signed)
Pt presents to the ed from [facility] with complaints of sudden onset aphasia and left sided weakness last night, CT at [facility] showed possible LVO so the patient is a code Stroke and taken straight to ct at 1023.

## 2017-05-14 NOTE — Progress Notes (Signed)
CH follow-up for code stroke at shift change.

## 2017-05-14 NOTE — ED Provider Notes (Signed)
[facility] Emergency Department Provider Note   ____________________________________________   First MD Initiated Contact with Patient 05/14/17 [phone]     (approximate)  I have reviewed the triage vital signs and the nursing notes.   HISTORY  Chief Complaint Aphasia and Altered Mental Status  History limited by patient's altered mental status  HPI Mario Liu is a 56 y.o. male comes from a group home. Apparently he got up to make breakfast and stop talking. EMS brought him here when EMS brought him he was able to stand pivot and laid back on the bed at present he is awake and not following commands vital signs are stable his eyes seem to be deviated to the left he seems to have little bit of a right facial droop when I manipulate his extremities. His legs. He maintains his knees joints Loc so that his legs are straight without the same on both sides the left arm has a waxy flexibility and when I pick it up and let go he maintains it up over his chest for about 2 minutes before he left leg down the right arm is also exhibiting some waxy flexibility but he does not maintain an upright leg elevated just falls back on his chest wall. I am unable to get any other history from him because of the facial droop I will go ahead and run this is a code stroke.   Past Medical History:  Diagnosis Date  . COPD (chronic obstructive pulmonary disease) ([place])   . Hx of cholecystectomy   . Hypothyroidism   . Kidney cysts   . Major depressive disorder   . Tobacco abuse   . Vitamin D deficiency     There are no active problems to display for this patient.   History reviewed. No pertinent surgical history.  Prior to Admission medications   Medication Sig Start Date End Date Taking? Authorizing Provider  alendronate (FOSAMAX) 70 MG tablet Take by mouth.    [provider]  buPROPion (WELLBUTRIN XL) 150 MG 24 hr tablet Take by mouth.    [provider]    calcium carbonate (OS-CAL) 600 MG TABS tablet Take by mouth.    [provider]  cetirizine (ZYRTEC) 10 MG tablet Take 10 mg by mouth daily.    [provider]  Cholecalciferol (VITAMIN D-1000 MAX ST) 1000 units tablet Take 1,000 mg by mouth.    [provider]  citalopram (CELEXA) 20 MG tablet Take 40 mg by mouth.    [provider]  clonazePAM (KLONOPIN) 0.5 MG tablet Take 0.5 mg by mouth daily.    [provider]  fluticasone (FLONASE) 50 MCG/ACT nasal spray Place into the nose.    [provider]  furosemide (LASIX) 40 MG tablet Take 40 mg by mouth daily.    [provider]  meloxicam (MOBIC) 15 MG tablet Take 15 mg by mouth daily.    [provider]  potassium chloride (K-DUR) 10 MEQ tablet Take by mouth.    [provider]  risperiDONE (RISPERDAL) 1 MG tablet Take 3 mg by mouth 2 (two) times daily.    [provider]  traZODone (DESYREL) 50 MG tablet Take 50 mg by mouth daily.    [provider]    Allergies Patient has no known allergies.  No family history on file.  Social History Social History  Substance Use Topics  . Smoking status: Former Smoker    Packs/day: 1.00  Years: 39.00    Types: Cigarettes  . Smokeless tobacco: Never Used     Comment: Pt states he quit smoking 1 mth ago (03/20/17)   . Alcohol use No    Review of Systems  Unable to obtain ____________________________________________   PHYSICAL EXAM:  VITAL SIGNS: ED Triage Vitals  Enc Vitals Group     BP 05/14/17 0813 109/82     Pulse Rate 05/14/17 0813 75     Resp 05/14/17 0813 16     Temp 05/14/17 0813 97.6 F (36.4 C)     Temp Source 05/14/17 0813 Oral     SpO2 05/14/17 0813 95 %     Weight 05/14/17 0814 221 lb 3.2 oz (100.3 kg)     Height 05/14/17 0814 5\' 8"  (1.727 m)     Head Circumference --      Peak Flow --      Pain Score --      Pain Loc --      Pain Edu? --      Excl. in [place]? --      Constitutional: Awake but not responsive Eyes: Conjunctivae are normal. Pupils are round difficult to tell if they react because he is difficult to keep his eyes open. He seemed to be deviated to the left funduscopic exam is difficult because patient keeps moving his eyes little bit-when I do that his eyes do get near the midline at least. I do not see any papilledema Head: Atraumatic. Nose: No congestion/rhinnorhea. Mouth/Throat: Mucous membranes are moist.  Oropharynx non-erythematous. Neck: No stridor.  Cardiovascular: Normal rate, regular rhythm. Grossly normal heart sounds.  Good peripheral circulation. Respiratory: Normal respiratory effort.  No retractions. Lungs CTAB. Gastrointestinal: Soft and nontender. No distention. No abdominal bruits. No CVA tenderness. Musculoskeletal: No lower extremity tenderness trace edema.  No joint effusions. Neurologic: See history of present illness for description of patient's current neurologic exam Skin:  Skin is warm, dry and intact. No rash noted. Psychiatric: See history of present illness  ____________________________________________   LABS (all labs ordered are listed, but only abnormal results are displayed)  Labs Reviewed  COMPREHENSIVE METABOLIC PANEL - Abnormal; Notable for the following:       Result Value   Sodium 134 (*)    Glucose, Bld 120 (*)    Creatinine, Ser 1.64 (*)    GFR calc non Af Amer 46 (*)    GFR calc Af Amer 53 (*)    All other components within normal limits  GLUCOSE, CAPILLARY - Abnormal; Notable for the following:    Glucose-Capillary 124 (*)    All other components within normal limits  CBC  DIFFERENTIAL  PROTIME-INR  APTT  URINALYSIS, ROUTINE W REFLEX MICROSCOPIC  URINE DRUG SCREEN, QUALITATIVE ([facility] ONLY)  TROPONIN I  ETHANOL   ____________________________________________  EKG  EKG read and interpreted by me shows normal sinus rhythm rate of 79 normal axis poor baseline with some muscle  artifact makes it hard to interpret however there do not appear to be any acute ST-T wave changes. There is early R-wave progression. ____________________________________________  RADIOLOGY  IMPRESSION: 1. Moderate-sized acute left MCA infarct. Embolus involving a left MCA branch vessel in the sylvian fissure. Possible additional embolus more proximally near the MCA bifurcation. 2. ASPECTS is 6. 3. No acute intracranial hemorrhage. These results were called by telephone at the time of interpretation on 05/14/2017 at 8:48 am to Dr. Conni Slipper , who verbally acknowledged these results.   Electronically Signed  By: Logan Bores M.D.   On: 05/14/2017 08:52 ____________________________________________   PROCEDURES  Procedure(s) performed:   Procedures  Critical Care performed:   ____________________________________________   INITIAL IMPRESSION / ASSESSMENT AND PLAN / ED COURSE  Pertinent labs & imaging results that were available during my care of the patient were reviewed by me and considered in my medical decision making (see chart for details).      Discussed with Dr. Doy Mince she assumes care and has to patient transfer to [facility]. I also talked to Dr. Leonel Ramsay at [facility] about this patient before Dr. Doy Mince arrived.     ____________________________________________   FINAL CLINICAL IMPRESSION(S) / ED DIAGNOSES  Final diagnoses:  Cerebrovascular accident (CVA), unspecified mechanism ([place])      NEW MEDICATIONS STARTED DURING THIS VISIT:  New Prescriptions   No medications on file     Note:  This document was prepared using Dragon voice recognition software and may include unintentional dictation errors.    Nena Polio, MD 05/14/17 [phone]

## 2017-05-14 NOTE — ED Notes (Signed)
Patient's group home administrator reports that patient was last seen normal when he went to bed around 8pm.  She also does not know the name or phone number of patient's legal guardian but states she will look it up and call us back.

## 2017-05-14 NOTE — ED Provider Notes (Signed)
Pt transferred from [facility] for evaluation of acute CVA.  Pt with expressive aphasia, dysarthric speech, awake and alert. He has been evaluated by neurology and is not a TPA candidate.  Plan to admit for ongoing management.  Internal medicine consulted for admission.     Quintella Reichert, MD 05/14/17 [phone]

## 2017-05-14 NOTE — H&P (Signed)
Date: 05/14/2017               Patient Name:  Mario Liu MRN: [id number]  DOB: 1961/05/12 Age / Sex: 56 y.o., male   PCP: [name], FNP         Medical Service: Internal Medicine Teaching Service         Attending Physician: Dr. Quintella Reichert, MD    First Contact: Dr. Ronalee Red Pager: [phone]  Second Contact: Dr. Charlynn Grimes Pager: [phone]       After Hours (After 5p/  First Contact Pager: [phone]  weekends / holidays): Second Contact Pager: [phone]   Chief Complaint: aphasia and altered mental status  History of Present Illness: Mario Liu is a 56yo male with PMH significant for depression, Down's syndrome (currently living in a group home), COPD, and tobacco use who presents with aphasia since this morning at around 7am. History obtained from group home caregivers. Patient was in his usual state of health last night before he went to bed around 10pm. He was woken this morning and got up to get dressed. He came down to the kitchen and his caregiver noted that he was holding his underwear and a shirt in his hands, was not talking, and kept looking to his left side. No gait abnormalities. Caregivers state that he has not complained of any chest pain, palpitations, trouble breathing, fevers, or other symptoms recently. Patient has never had a stroke before. They called EMS and he was brought to [facility]. Head CT at [facility] demonstrated hypodensity in L MCA territory, consistent with infarct. He was transferred to [facility] for further evaluation and potential intervention.  In the ER, BP 106/80, HR 75, RR 18, temp 97.6, O2 95% on RA. CT-A with left M2 branch occlusion with downstream infarct and ischemia. No stenosis, atherosclerosis, or embolic source identified in the neck. Outside tPA treatment window. CBG 120. INR 1.04, Cr 1.64 (was 1.19 on 8/6), UA with moderate Hgb and 0-5 RBC. Received rectal aspirin, Neurology was consulted, and stroke work-up initiated.  At baseline, patient is able to  walk on his own, talk, dress himself, do some chores, but unable to complete activities of daily leaving, including cooking, cleaning, and needs some help with bathing. On interviewing the patient, he endorses that he is hungry, has trouble breathing, and his left shoulder hurts. Denies chest pain, abdominal pain, or headaches. Caregivers state that he did recognize who they were and that his speech is still different than it has been. He does smoke ~1ppd, no alcohol use or other illicit drugs. His sister, Mario Liu, is his legal guardian. She lives in [place] and is on her way to [place].  He has never had a stroke before, but caregivers state that a few years ago, he did have a seizure that lasted a few minutes where his whole body was tremoring/shaking. He came out of the seizure on his own and was confused for a little but returned to baseline soon after. He has never had another seizure.  Meds:  Current Meds  Medication Sig  . albuterol (PROVENTIL HFA;VENTOLIN HFA) 108 (90 Base) MCG/ACT inhaler Inhale 2 puffs into the lungs every 6 (six) hours as needed for wheezing or shortness of breath.  Marland Kitchen alendronate (FOSAMAX) 70 MG tablet Take 70 mg by mouth once a week. Fridays  . buPROPion (WELLBUTRIN XL) 150 MG 24 hr tablet Take 150 mg by mouth daily.   . calcium carbonate (OS-CAL) 600 MG TABS tablet Take  600 mg by mouth 2 (two) times daily with a meal.   . cetirizine (ZYRTEC) 10 MG tablet Take 10 mg by mouth daily.  . Cholecalciferol (VITAMIN D3) 50000 units CAPS Take 1 Can by mouth every 7 (seven) days. Thursdays  . citalopram (CELEXA) 40 MG tablet Take 40 mg by mouth daily.   . clonazePAM (KLONOPIN) 0.5 MG tablet Take 0.5 mg by mouth daily. *may also take an additional 0.5mg  tab as needed for anxiety/agitation  . fluticasone (FLONASE) 50 MCG/ACT nasal spray Place 2 sprays into the nose daily as needed for allergies.   . furosemide (LASIX) 40 MG tablet Take 40 mg by mouth daily.  . meloxicam (MOBIC)  15 MG tablet Take 15 mg by mouth daily.  . potassium chloride (K-DUR) 10 MEQ tablet Take 10 mEq by mouth daily.   . risperiDONE (RISPERDAL) 3 MG tablet Take 3 mg by mouth 2 (two) times daily.  Marland Kitchen sulfamethoxazole-trimethoprim (BACTRIM DS,SEPTRA DS) 800-160 MG tablet Take 1 tablet by mouth 2 (two) times daily.  . traZODone (DESYREL) 50 MG tablet Take 50 mg by mouth at bedtime.   . [DISCONTINUED] pseudoephedrine-dextromethorphan-guaifenesin (ROBITUSSIN-PE) 30-10-100 MG/5ML solution Take 10 mLs by mouth 4 (four) times daily as needed for cough.   Allergies: Allergies as of 05/14/2017  . (No Known Allergies)   Past Medical History:  Diagnosis Date  . COPD (chronic obstructive pulmonary disease) ([place])   . Hx of cholecystectomy   . Hypothyroidism   . Kidney cysts   . Major depressive disorder   . Tobacco abuse   . Vitamin D deficiency    Family History:  No family history on file.  Social History: - lives in a group home - sister Mario Liu is legal guardian - smokes ~1ppd - denies alcohol or other illicit drug use  Review of Systems: A complete ROS was negative except as per HPI.  Physical Exam: Blood pressure 117/89, pulse 81, temperature (!) 97.5 F (36.4 C), temperature source Rectal, resp. rate 19, SpO2 100 %.  GEN: AA male lying in bed with transient tremors or moments of difficulty catching breath; keeps eyes closed most of the time; answers to voice; flat nasal bridge; wide set eyes HENT: Moist mucous membranes. No visible lesions. No carotid bruits. EYES: Left gaze preference. PERRL. Keeps his eyes closed most of the time RESP: Difficult to assess due to patient not taking deep breaths, but clear to auscultation; no obvious wheezes or crackles CV: Normal rate and regular rhythm. No murmurs, gallops, or rubs. No LE edema. ABD: Soft. Non-tender. Non-distended. Normoactive bowel sounds. EXT: No edema. Warm. 1+ PT pulses; 1+ R DP pulse; difficult to palpate L DP pulse NEURO:  Left gaze preference; PERRL; facial sensation decreased on right; R sided facial droop; difficult to assess strength due to limited patient cooperation, however strength seemed largely symmetric in bilateral UE and LE. Aphasia.  Labs CBC Latest Ref Rng & Units 05/14/2017 04/20/2017  WBC 3.8 - 10.6 K/uL 5.3 4.7  Hemoglobin 13.0 - 18.0 g/dL 13.6 12.6(L)  Hematocrit 40.0 - 52.0 % 40.7 37.1(L)  Platelets 150 - 440 K/uL 123(L) 128(L)   CMP Latest Ref Rng & Units 05/14/2017 04/20/2017 02/03/2012  Glucose 65 - 99 mg/dL 120(H) 114(H) -  BUN 6 - 20 mg/dL 17 18 -  Creatinine 0.61 - 1.24 mg/dL 1.64(H) 1.19 -  Sodium 135 - 145 mmol/L 134(L) 135 -  Potassium 3.5 - 5.1 mmol/L 5.0 3.6 -  Chloride 101 - 111 mmol/L 102 100(L) -  CO2 22 - 32 mmol/L 26 28 -  Calcium 8.9 - 10.3 mg/dL 9.3 9.2 -  Total Protein 6.5 - 8.1 g/dL 8.0 7.6 7.6  Total Bilirubin 0.3 - 1.2 mg/dL 1.0 0.4 0.6  Alkaline Phos 38 - 126 U/L 40 50 59  AST 15 - 41 U/L 33 26 75(H)  ALT 17 - 63 U/L 18 21 70   ABG 7.409/36.7/69.0 PT 13.5, INR 1.04 PTT 28 UA moderate hemoglobin, 0-5 RBC, mucus present Alcohol <5 UDS negative  EKG: NSR  CT head 8/30 1. Moderate-sized acute left MCA infarct. Embolus involving a left MCA branch vessel in the sylvian fissure. Possible additional embolus more proximally near the MCA bifurcation. 2. No acute intracranial hemorrhage.  CT-A head and neck & CT cerebral perfusion 8/30 1. Left M2 branch occlusion with downstream 14 cc of left temporoparietal infarct and 38 cc of ischemia by cerebral perfusion. No progression of aspects compared to CT earlier today. No acute hemorrhage. 2. No stenosis, atherosclerosis, or embolic source identified in the neck. 3. Minimal if any atheromatous changes in the intracranial circulation.  Assessment & Plan by Problem: Active Problems:   * No active hospital problems. *  Mario Liu is a 56yo male with PMH significant for depression, Down's syndrome (currently living in a  group home), COPD, and tobacco use who presents with acute onset of aphasia, R facial droop, decreased facial sensation on right, and left gaze preference. Last known normal last night ~10pm. Outside tPA window. CT imaging demonstrates left M2 branch occlusion with L MCA infarct.  # L MCA infarct Patient with acute onset of aphasia, R facial droop, decreased facial sensation on right, and left gaze preference, consistent with L MCA infarct, confirmed by CT. UE and LE strength grossly intact, but physical exam is limited by patient's ability to cooperate. History is also difficult to obtain from patient, but he seems to deny chest pain, palpitations, or headache. Etiology likely embolic. Patient also has Down syndrome, with increased risk of ASD/VSD. Neurology consulted and do not feel he is a candidate for intra-arterial intervention, and initiated stroke work-up. Patient also has some upper airway sounds / momentary pauses in breathing - concern for pharyngeal weakness and aspiration. Will allow permissive hypertension in acute setting. - HbA1c, lipid panel - MRI brain - frequent neuro checks - echocardiogram bubble study - aspirin - telemetry - PT/OT, speech - NPO - hold home lasix  # COPD Patient without shortness of breath or increased oxygen requirement. - continue to monitor O2 sats  Diet: NPO VTE PPx: Lovenox Code status: Full - confirmed with legal guardian - Mario Liu (sister)  Dispo: Admit patient to Inpatient with expected length of stay greater than 2 midnights.  Signed: Colbert Ewing, MD  Internal Medicine, PGY-1 05/14/2017, 2:10 PM  Pager: Mamie Nick [phone]

## 2017-05-14 NOTE — Consult Note (Signed)
Referring Physician: Cinda Quest    Chief Complaint: Difficulty with speech  HPI: Mario Liu is an 56 y.o. male resident of a group home with a history of tobacco abuse who was in usual state of health on going to bed last evening.  Awakened this morning and was not as talkative as usual.  Was able to ambulate to the kitchen for breakfast and take his medications but was not his usual self.  Seemed to have difficulty with his RUE.  At that time was brought in for evaluation. Initial NIHSS of 15.      Date last known well: Date: 05/13/2017 Time last known well: Time: 21:00 tPA Given: No: Outside time window  Past Medical History:  Diagnosis Date  . COPD (chronic obstructive pulmonary disease) ([place])   . Hx of cholecystectomy   . Hypothyroidism   . Kidney cysts   . Major depressive disorder   . Tobacco abuse   . Vitamin D deficiency     Surgical history: Unable to obtain due to aphasia  Family history: Unable to obtain due to aphasia  Social History: Unable to assess due to aphasia  Allergies: No Known Allergies  Medications: I have reviewed the patient's current medications. Prior to Admission:  Prior to Admission medications   Medication Sig Start Date End Date Taking? Authorizing Provider  albuterol (PROVENTIL HFA;VENTOLIN HFA) 108 (90 Base) MCG/ACT inhaler Inhale 2 puffs into the lungs every 6 (six) hours as needed for wheezing or shortness of breath.   Yes [provider]  alendronate (FOSAMAX) 70 MG tablet Take 70 mg by mouth once a week. Fridays   Yes [provider]  buPROPion (WELLBUTRIN XL) 150 MG 24 hr tablet Take 150 mg by mouth daily.    Yes [provider]  calcium carbonate (OS-CAL) 600 MG TABS tablet Take 600 mg by mouth 2 (two) times daily with a meal.    Yes [provider]  cetirizine (ZYRTEC) 10 MG tablet Take 10 mg by mouth daily.   Yes [provider]  Cholecalciferol (VITAMIN D3) 50000 units CAPS Take 1 Can by mouth  every 7 (seven) days. Thursdays   Yes [provider]  citalopram (CELEXA) 40 MG tablet Take 40 mg by mouth.   Yes [provider]  clonazePAM (KLONOPIN) 0.5 MG tablet Take 0.5 mg by mouth daily.   Yes [provider]  clonazePAM (KLONOPIN) 0.5 MG tablet Take 0.5 mg by mouth every 8 (eight) hours as needed for anxiety (agitation).   Yes [provider]  fluticasone (FLONASE) 50 MCG/ACT nasal spray Place 2 sprays into the nose daily as needed for allergies.    Yes [provider]  furosemide (LASIX) 40 MG tablet Take 40 mg by mouth daily.   Yes [provider]  meloxicam (MOBIC) 15 MG tablet Take 15 mg by mouth daily.   Yes [provider]  potassium chloride (K-DUR) 10 MEQ tablet Take 10 mEq by mouth daily.    Yes [provider]  pseudoephedrine-dextromethorphan-guaifenesin (ROBITUSSIN-PE) 30-10-100 MG/5ML solution Take 10 mLs by mouth 4 (four) times daily as needed for cough.   Yes [provider]  risperiDONE (RISPERDAL) 3 MG tablet Take 3 mg by mouth 2 (two) times daily.   Yes [provider]  sulfamethoxazole-trimethoprim (BACTRIM DS,SEPTRA DS) 800-160 MG tablet Take 1 tablet by mouth 2 (two) times daily. 05/05/17 05/15/17 Yes [provider]  traZODone (DESYREL) 50 MG tablet Take 50 mg by mouth daily.  Yes [provider]    ROS: Unable to obtain due to aphasia  Physical Examination: Blood pressure 121/85, pulse 76, temperature 97.6 F (36.4 C), temperature source Oral, resp. rate 16, height 5\' 8"  (1.727 m), weight 100.3 kg (221 lb 3.2 oz), SpO2 100 %.  HEENT-  Normocephalic, no lesions, without obvious abnormality.  Normal external eye and conjunctiva.  Normal TM's bilaterally.  Normal auditory canals and external ears. Normal external nose, mucus membranes and septum.  Normal pharynx. Cardiovascular- S1, S2 normal, pulses palpable throughout   Lungs- chest clear, no wheezing,  rales, normal symmetric air entry Abdomen- soft, non-tender; bowel sounds normal; no masses,  no organomegaly Extremities- no edema Lymph-no adenopathy palpable Musculoskeletal-no joint tenderness, deformity or swelling Skin-warm and dry, no hyperpigmentation, vitiligo, or suspicious lesions  Neurological Examination   Mental Status: Lethargic.  With receptive and expressive aphasia.  Unable to follow commands.  Cranial Nerves: II: Discs flat bilaterally; Does not blink to confrontation from the right.  Pupils equal, round, reactive to light and accommodation III,IV, VI: ptosis not present, extra-ocular motions intact bilaterally but with left gaze preference V,VII: right facial droop, facial light touch sensation decreased on the right VIII: hearing normal bilaterally IX,X: gag reflex present XI: unable to test XII: unable to test Motor: Right : Upper extremity   5-/5    Left:     Upper extremity   5/5  Lower extremity   5-/5     Lower extremity   5/5 Tone and bulk:normal tone throughout; no atrophy noted Sensory: Pinprick and light touch decreased on the right Deep Tendon Reflexes: 2+ and symmetric with absent AJ's bilaterally Plantars: Right: mute   Left: mute Cerebellar: Unable to test due to aphasia Gait: patient able to stand and ambulate with assistance    Laboratory Studies:  Basic Metabolic Panel:  Recent Labs Lab 05/14/17 0815  NA 134*  K 5.0  CL 102  CO2 26  GLUCOSE 120*  BUN 17  CREATININE 1.64*  CALCIUM 9.3    Liver Function Tests:  Recent Labs Lab 05/14/17 0815  AST 33  ALT 18  ALKPHOS 40  BILITOT 1.0  PROT 8.0  ALBUMIN 3.9   No results for input(s): LIPASE, AMYLASE in the last 168 hours. No results for input(s): AMMONIA in the last 168 hours.  CBC:  Recent Labs Lab 05/14/17 0815  WBC 5.3  NEUTROABS 2.1  HGB 13.6  HCT 40.7  MCV 85.3  PLT 123*    Cardiac Enzymes: No results for input(s): CKTOTAL, CKMB, CKMBINDEX, TROPONINI in  the last 168 hours.  BNP: Invalid input(s): POCBNP  CBG:  Recent Labs Lab 05/14/17 5852  DPOEUM 353*    Microbiology: No results found for this or any previous visit.  Coagulation Studies:  Recent Labs  05/14/17 0841  LABPROT 13.5  INR 1.04    Urinalysis: No results for input(s): COLORURINE, LABSPEC, PHURINE, GLUCOSEU, HGBUR, BILIRUBINUR, KETONESUR, PROTEINUR, UROBILINOGEN, NITRITE, LEUKOCYTESUR in the last 168 hours.  Invalid input(s): APPERANCEUR  Lipid Panel: No results found for: CHOL, TRIG, HDL, CHOLHDL, VLDL, LDLCALC  HgbA1C: No results found for: HGBA1C  Urine Drug Screen:  No results found for: LABOPIA, COCAINSCRNUR, LABBENZ, AMPHETMU, THCU, LABBARB  Alcohol Level: No results for input(s): ETH in the last 168 hours.  Other results: EKG: sinus rhythm at 79 bpm.  Imaging: Ct Head Code Stroke Wo Contrast  Result Date: 05/14/2017 CLINICAL DATA:  Code stroke.  Aphasia. EXAM: CT HEAD WITHOUT CONTRAST TECHNIQUE: Contiguous axial  images were obtained from the base of the skull through the vertex without intravenous contrast. COMPARISON:  None. FINDINGS: Brain: There is cortical and white matter hypoattenuation consistent with acute infarction involving the left temporal lobe, left parietal lobe, and left insula. No acute intracranial hemorrhage, mass, midline shift, or extra-axial fluid collection is present. The ventricles and sulci are normal. Periventricular white matter hypoattenuation is nonspecific but may reflect very mild chronic small vessel ischemic disease. Vascular: 4 mm hyperdense focus in the left sylvian fissure consistent with an acute MCA branch vessel embolus. Questionable small left MCA embolus more proximally near the expected region of the bifurcation. Skull: No fracture or focal osseous lesion. Sinuses/Orbits: Moderate volume fluid in the left sphenoid sinus. Right sphenoid sinus mucous retention cyst. Partially visualized left maxillary sinus mucous  retention cyst. Mild mucosal thickening elsewhere in the ethmoid and maxillary sinuses bilaterally. Clear mastoid air cells. Unremarkable orbits. Other: None. ASPECTS ([place] Stroke Program Early CT Score) - Ganglionic level infarction (caudate, lentiform nuclei, internal capsule, insula, M1-M3 cortex): 4 (insula, M2, and M3 involvement) - Supraganglionic infarction (M4-M6 cortex): 2 (M6 involvement) Total score (0-10 with 10 being normal): 6 IMPRESSION: 1. Moderate-sized acute left MCA infarct. Embolus involving a left MCA branch vessel in the sylvian fissure. Possible additional embolus more proximally near the MCA bifurcation. 2. ASPECTS is 6. 3. No acute intracranial hemorrhage. These results were called by telephone at the time of interpretation on 05/14/2017 at 8:48 am to Dr. Conni Slipper , who verbally acknowledged these results. Electronically Signed   By: Logan Bores M.D.   On: 05/14/2017 08:52    Assessment: 56 y.o. male presenting with aphasia and mild right hemiparesis.  Head CT reviewed and shows a hypodensity in the left MCA territory consistent with infarct.  Suspect embolic etiology.  Patient not a tPA candidate due to being outside time window.  May still benefit from intervention.  Patient accepted at [facility] in transfer for further evaluation and management.    Stroke Risk Factors - none  Plan: 1. ASA 300mg  rectally 2. Transfer to [facility] 3. NPO until RN stroke swallow screen 4. Telemetry monitoring 5. Frequent neuro checks  This patient is critically ill and at significant risk of neurological worsening, death and care requires constant monitoring of vital signs, hemodynamics,respiratory and cardiac monitoring, neurological assessment, discussion with family, other specialists and medical decision making of high complexity. I spent 60 minutes of neurocritical care time  in the care of  this patient.  Alexis Goodell, MD Neurology [phone] 05/14/2017  10:02 AM

## 2017-05-14 NOTE — Progress Notes (Signed)
Patient arrived to floor from ED. No sign of pain or discomfort observed. Will continue to monitor

## 2017-05-14 NOTE — ED Notes (Signed)
EMTALA reviewed by charge RN 

## 2017-05-14 NOTE — Consult Note (Addendum)
Neurology Consultation Reason for Consult: Aphasia Referring Physician: Rudean Haskell  CC: Aphasia  History is obtained from: Chart review, caregiver  HPI: Mario Liu is a 56 y.o. male with a history of depression, hypothyroidism, some type of cognitive disorder which requires him to live in a group home.  His caregiver states that at baseline, he is able to talk, and her active, but is unable to accomplish activities of daily living like cooking/cleaning.    LKW: Unclear, likely last night tpa given?: no, unclear time of onset Premorbid modified rankin scale: 3  ROS:  Unable to obtain due to altered mental status.   Past Medical History:  Diagnosis Date  . COPD (chronic obstructive pulmonary disease) ([place])   . Hx of cholecystectomy   . Hypothyroidism   . Kidney cysts   . Major depressive disorder   . Tobacco abuse   . Vitamin D deficiency      Family history: Unable to obtain due to altered mental status  Social History:  reports that he has quit smoking. His smoking use included Cigarettes. He has a 39.00 pack-year smoking history. He has never used smokeless tobacco. He reports that he does not drink alcohol or use drugs.   Exam: Current vital signs: BP 117/89   Pulse 81   Temp (!) 97.5 F (36.4 C) (Rectal)   Resp 19   SpO2 100%  Vital signs in last 24 hours: Temp:  [97.5 F (36.4 C)-97.6 F (36.4 C)] 97.5 F (36.4 C) (08/30 1108) Pulse Rate:  [73-86] 81 (08/30 1115) Resp:  [16-19] 19 (08/30 1115) BP: (106-121)/(80-89) 117/89 (08/30 1115) SpO2:  [95 %-100 %] 100 % (08/30 1115) Weight:  [100.3 kg (221 lb 3.2 oz)] 100.3 kg (221 lb 3.2 oz) (08/30 9983)   Physical Exam  Constitutional: Appears well-developed and well-nourished.  Psych: Affect appropriate to situation Eyes: No scleral injection HENT: No OP obstrucion Head: Normocephalic.  Cardiovascular: Normal rate and regular rhythm.  Respiratory: Effort normal and breath sounds normal to anterior  ascultation GI: Soft.  No distension. There is no tenderness.  Skin: WDI  Neuro: Mental Status: Patient is awake, alert, he is able to follow some commands, but speech is unintelligible. Cranial Nerves: II: He does not clearly blink to threat from the right, doesn't the left. Pupils are equal, round, and reactive to light.   III,IV, VI: EOMI without ptosis or diploplia.  V,VII: Facial movement with right facial weakness VIII, X, XI, XII: Unable to assess secondary to patient's altered mental status.  Motor: He has good strength on the left side, very mild weakness in the right arm and leg, 4+/5 Sensory: He withdraws to noxious stimulation on the right side, but not as much as the left Cerebellar: No clear ataxia  I have reviewed labs in epic and the results pertinent to this consultation are: Premarin 1.6  I have reviewed the images obtained: CT perfusion-based on our criteria, there appears to be a 14 mm area of infarct as well as a 38 mL area of ischemia, suggesting a 24 mL area of penumbra. I suspect that this is actually a low estimate of the infarct based on plain CT which appears to show infarct in the areas supplied by the vessel occluded.  Impression: Acute , likely embolic, ischemic infarct in the left MCA territory  Assessment: 56 year old male with acute aphasia, I suspect that at this point intra-arterial intervention is unlikely to be of benefit to him. This is likely an embolic infarct,  and he'll need to be admitted for workup of such.   He does appear to have some upper airway sounds, unclear if maybe he has some pharyngeal weakness, but I'm not certain that the stroke explains this finding.  Recommendations: 1. HgbA1c, fasting lipid panel 2. MRI  of the brain without contrast 3. Frequent neuro checks 4. Echocardiogram 5. Prophylactic therapy-Antiplatelet med: Aspirin - dose 325mg  PO or 300mg  PR 6. Risk factor modification 7. Telemetry monitoring 8. PT consult, OT  consult, Speech consult 9. please page stroke NP  Or  PA  Or MD  from 8am -4 pm as this patient will be followed by the stroke team at this point.   You can look them up on [email or web address]      Roland Rack, MD [facility] [phone]  If 7pm- 7am, please page neurology on call as listed in [place].

## 2017-05-14 NOTE — ED Notes (Signed)
4 failed attempts to start an 18 gauge IV in patient's [facility], per request of Neurologist at [facility].

## 2017-05-14 NOTE — Code Documentation (Signed)
56yo male arriving to [facility] via [place] at 1020.  Patient transferred from [facility] ED as a potential endovascular intervention candidate.  Stroke team at the bedside on patient arrival.  Patient cleared for CT by ED PA.  Patient to CT 2 with team.  18G PIV placed via ultrasound by ED RN.  CTA and CTP imaging completed.  NIHSS 15, see documentation for details and code stroke times.  Patient with left gaze preference, right facial droop and hemianopia, unable to follow commands with global aphasia on exam.  Patient was LKW yesterday at 2100.  Patient is not a candidate for tPA d/t being outside the treatment window and is not an endovascular intervention candidate per Dr. Leonel Ramsay.  Patient to be admitted for stroke work-up.  Bedside handoff with ED RN Loma Sousa.

## 2017-05-14 NOTE — ED Notes (Signed)
Bladder scan 188 ml, edp notified

## 2017-05-14 NOTE — Progress Notes (Signed)
Staff with patient. [place] will try again at a later time to check on caregiver.

## 2017-05-14 NOTE — ED Triage Notes (Signed)
Patient presents to the ED via EMS from a group home with sudden aphasia.  Patient appears to have slight left sided droop.  Patient is responsive to pain.  Per staff at group home, patient is normally very talkative, he was on his way to the kitchen to make breakfast around 7:15 and suddenly couldn't speak and was behaving differently.  Patient is able to follow some commands.

## 2017-05-15 ENCOUNTER — Inpatient Hospital Stay (HOSPITAL_COMMUNITY): Payer: Medicare Other

## 2017-05-15 DIAGNOSIS — I639 Cerebral infarction, unspecified: Secondary | ICD-10-CM

## 2017-05-15 DIAGNOSIS — I63512 Cerebral infarction due to unspecified occlusion or stenosis of left middle cerebral artery: Secondary | ICD-10-CM

## 2017-05-15 DIAGNOSIS — Q211 Atrial septal defect: Secondary | ICD-10-CM

## 2017-05-15 DIAGNOSIS — I340 Nonrheumatic mitral (valve) insufficiency: Secondary | ICD-10-CM

## 2017-05-15 DIAGNOSIS — I253 Aneurysm of heart: Secondary | ICD-10-CM

## 2017-05-15 DIAGNOSIS — Q909 Down syndrome, unspecified: Secondary | ICD-10-CM

## 2017-05-15 LAB — LIPID PANEL
CHOL/HDL RATIO: 4.2 ratio
CHOLESTEROL: 138 mg/dL (ref 0–200)
HDL: 33 mg/dL — ABNORMAL LOW (ref >40)
LDL Cholesterol: 80 mg/dL (ref 0–99)
TRIGLYCERIDES: 125 mg/dL (ref <150)
VLDL: 25 mg/dL (ref 0–40)

## 2017-05-15 LAB — ECHOCARDIOGRAM COMPLETE BUBBLE STUDY
Ao-asc: 30 cm
CHL CUP MV DEC (S): 201
E/e' ratio: 6.32
EWDT: 201 ms
FS: 30 % (ref 28–44)
IVS/LV PW RATIO, ED: 0.91
LA ID, A-P, ES: 35 mm
LA vol index: 30.3 mL/m2
LA vol: 67.6 mL
LADIAMINDEX: 1.57 cm/m2
LAVOLA4C: 53.4 mL
LDCA: 4.52 cm2
LEFT ATRIUM END SYS DIAM: 35 mm
LV TDI E'MEDIAL: 10.1
LVEEAVG: 6.32
LVEEMED: 6.32
LVELAT: 12.9 cm/s
LVOTD: 24 mm
MV Peak grad: 3 mmHg
MV pk A vel: 82 m/s
MV pk E vel: 81.5 m/s
PW: 10 mm — AB (ref 0.6–1.1)
RV LATERAL S' VELOCITY: 12.6 cm/s
TAPSE: 16.1 mm
TDI e' lateral: 12.9

## 2017-05-15 LAB — BASIC METABOLIC PANEL
Anion gap: 5 (ref 5–15)
BUN: 10 mg/dL (ref 6–20)
CALCIUM: 8.6 mg/dL — AB (ref 8.9–10.3)
CHLORIDE: 108 mmol/L (ref 101–111)
CO2: 23 mmol/L (ref 22–32)
CREATININE: 1.19 mg/dL (ref 0.61–1.24)
GFR calc non Af Amer: >60 mL/min (ref >60)
Glucose, Bld: 100 mg/dL — ABNORMAL HIGH (ref 65–99)
Potassium: 4.1 mmol/L (ref 3.5–5.1)
SODIUM: 136 mmol/L (ref 135–145)

## 2017-05-15 LAB — HIV ANTIBODY (ROUTINE TESTING W REFLEX)
HIV SCREEN 4TH GENERATION: NONREACTIVE
HIV Screen 4th Generation wRfx: NONREACTIVE

## 2017-05-15 LAB — HEMOGLOBIN A1C
Hgb A1c MFr Bld: 5.9 % — ABNORMAL HIGH (ref 4.8–5.6)
MEAN PLASMA GLUCOSE: 122.63 mg/dL

## 2017-05-15 MED ORDER — ATORVASTATIN CALCIUM 40 MG PO TABS
40.0000 mg | ORAL_TABLET | Freq: Every day | ORAL | Status: DC
  Administered 2017-05-15 – 2017-05-17 (×3): 40 mg via ORAL
  Filled 2017-05-15 (×3): qty 1

## 2017-05-15 MED ORDER — CLOPIDOGREL BISULFATE 75 MG PO TABS
75.0000 mg | ORAL_TABLET | Freq: Every day | ORAL | Status: DC
  Administered 2017-05-16 – 2017-05-18 (×3): 75 mg via ORAL
  Filled 2017-05-15 (×3): qty 1

## 2017-05-15 NOTE — Consult Note (Signed)
Physical Medicine and Rehabilitation Consult Reason for Consult: Decreased functional mobility Referring Physician: Internal medicine   HPI: Mario Liu is a 56 y.o. right hand male with history of COPD, tobacco abuse, Down's syndrome currently living in a group home. He was independent with his own ADLs bathing dressing and toileting prior to admission. Presented 05/14/2017 with aphasia and altered mental status as reported by his caregiver. Cranial CT scan showed moderate size acute left MCA infarct. CT cerebral perfusion scan with left M2 branch occlusion . Patient did not receive TPA. CT angiogram head and neck with no stenosis atherosclerosis or embolic source identified. Neurology consulted with workup ongoing. Echocardiogram ejection fraction of 60%. Lower extremity Dopplers negative for DVT. Currently maintained on aspirin for CVA prophylaxis. Subcutaneous Lovenox for DVT prophylaxis. Currently with a dysphagia #1 diet. Occupational therapy evaluation completed with recommendations of physical medicine rehabilitation consult.     Review of Systems  Unable to perform ROS: Language   Past Medical History:  Diagnosis Date  . COPD (chronic obstructive pulmonary disease) ([place])   . Hx of cholecystectomy   . Hypothyroidism   . Kidney cysts   . Major depressive disorder   . Tobacco abuse   . Vitamin D deficiency    History reviewed. No pertinent surgical history. History reviewed. No pertinent family history. Social History:  reports that he has quit smoking. His smoking use included Cigarettes. He has a 39.00 pack-year smoking history. He has never used smokeless tobacco. He reports that he does not drink alcohol or use drugs. Allergies: No Known Allergies Medications Prior to Admission  Medication Sig Dispense Refill  . albuterol (PROVENTIL HFA;VENTOLIN HFA) 108 (90 Base) MCG/ACT inhaler Inhale 2 puffs into the lungs every 6 (six) hours as needed for wheezing or shortness of  breath.    Marland Kitchen alendronate (FOSAMAX) 70 MG tablet Take 70 mg by mouth once a week. Fridays    . buPROPion (WELLBUTRIN XL) 150 MG 24 hr tablet Take 150 mg by mouth daily.     . calcium carbonate (OS-CAL) 600 MG TABS tablet Take 600 mg by mouth 2 (two) times daily with a meal.     . cetirizine (ZYRTEC) 10 MG tablet Take 10 mg by mouth daily.    . Cholecalciferol (VITAMIN D3) 50000 units CAPS Take 1 Can by mouth every 7 (seven) days. Thursdays    . citalopram (CELEXA) 40 MG tablet Take 40 mg by mouth daily.     . clonazePAM (KLONOPIN) 0.5 MG tablet Take 0.5 mg by mouth daily. *may also take an additional 0.5mg  tab as needed for anxiety/agitation    . fluticasone (FLONASE) 50 MCG/ACT nasal spray Place 2 sprays into the nose daily as needed for allergies.     . furosemide (LASIX) 40 MG tablet Take 40 mg by mouth daily.    . meloxicam (MOBIC) 15 MG tablet Take 15 mg by mouth daily.    . potassium chloride (K-DUR) 10 MEQ tablet Take 10 mEq by mouth daily.     . risperiDONE (RISPERDAL) 3 MG tablet Take 3 mg by mouth 2 (two) times daily.    Marland Kitchen sulfamethoxazole-trimethoprim (BACTRIM DS,SEPTRA DS) 800-160 MG tablet Take 1 tablet by mouth 2 (two) times daily.    . traZODone (DESYREL) 50 MG tablet Take 50 mg by mouth at bedtime.       Home: Home Living Family/patient expects to be discharged to:: Group home Available Help at Discharge: Available 24 hours/day Type of Home:  Group Home  Lives With: Other (Comment)  Functional History: Prior Function Level of Independence: Needs assistance Gait / Transfers Assistance Needed: Amb w/o AD per pt sister ADL's / Homemaking Assistance Needed: Lives in group home, does own ADL's, bathes/dress/toileting. Has assistance with Meals and homaking. Has chores; 5 days per week goes to school and has jobs there as well Communication / Swallowing Assistance Needed: Pt has always had some garbled speech but sister states that he was able to be understood if you asked him a  question a second time. Aphasia Functional Status:  Mobility: Bed Mobility Overal bed mobility: Needs Assistance Bed Mobility: Supine to Sit, Sit to Supine Supine to sit: Supervision Sit to supine: Min guard Transfers Overall transfer level: Needs assistance Equipment used: 1 person hand held assist Transfers: Sit to/from Stand, Stand Pivot Transfers Sit to Stand: Min assist Stand pivot transfers: Min assist General transfer comment: Pt with noted L gaze perference and did not attend to right during transfers and functional mobility.      ADL: ADL Overall ADL's : Needs assistance/impaired Eating/Feeding: NPO Grooming: Wash/dry hands, Wash/dry face, Standing, Minimal assistance Grooming Details (indicate cue type and reason): Gaze preference to the left. Requires increased vc's to look to right during ADL's  Upper Body Bathing: Minimal assistance, Sitting, Set up Lower Body Bathing: Min guard, Sitting/lateral leans, Sit to/from stand, Cueing for safety Upper Body Dressing : Minimal assistance, Sitting, Cueing for safety Lower Body Dressing: Moderate assistance, Sit to/from stand Toilet Transfer: Ambulation, Comfort height toilet, Minimal assistance (+1 hand held assist initially, then min quard while standing at toilet to urinate) Toileting- Water quality scientist and Hygiene: Min guard, Sit to/from stand Functional mobility during ADLs: Supervision/safety, Min guard, Cueing for safety (+1 hand held assist with vc's for safety when ambulating in room for ADL's) General ADL Comments: Pt was assessed for OT followed by participation in ADL retrainign session to include toileting, LB dressing, washing hands/face standing at sink and some bathing sitting at EOB. Pt is with noted L gaze preference and difficulty attending to R side as well as difficulty following 1 step commands. Cont to assess if this is related to L MCA Infarct or a combination of this as well as his cognitive  deficits.  Cognition: Cognition Overall Cognitive Status: History of cognitive impairments - at baseline Arousal/Alertness: Awake/alert Orientation Level: Oriented to person Attention: Focused, Sustained Focused Attention: Appears intact Sustained Attention: Impaired Sustained Attention Impairment: Verbal basic, Functional basic Awareness: Impaired Awareness Impairment: Intellectual impairment Problem Solving: Impaired Problem Solving Impairment: Verbal basic, Functional basic Safety/Judgment: Impaired Cognition Arousal/Alertness: Awake/alert Behavior During Therapy: Flat affect, WFL for tasks assessed/performed Overall Cognitive Status: History of cognitive impairments - at baseline General Comments: When asked if he had any pain, pt was unintelligible but when sister asked him again, he indicated that his head hurt. He was asked "A lot or a little?" and shown hand gestures at the same time and did not respond. His sister then asked "From 1-10, how bad is it?" and Pt stated "10" clearly. Difficult to assess due to: Impaired communication  Blood pressure 114/70, pulse 80, temperature 97.7 F (36.5 C), temperature source Oral, resp. rate 16, SpO2 98 %. Physical Exam  HENT:  Right facial droop.  Eyes: EOM are normal.  Neck: Normal range of motion. Neck supple. No thyromegaly present.  Cardiovascular: Normal rate, regular rhythm and normal heart sounds.   Respiratory: Effort normal and breath sounds normal. No respiratory distress.  GI: Soft. Bowel  sounds are normal. He exhibits no distension.  Neurological: He is alert.  Exam somewhat limited by language barrier and patient being cooperative. He did follow basic commands and uses hand gestures. Right sided weakness  Skin: Skin is warm and dry.    Results for orders placed or performed during the hospital encounter of 05/14/17 (from the past 24 hour(s))  Hemoglobin A1c     Status: Abnormal   Collection Time: 05/15/17  4:33 AM   Result Value Ref Range   Hgb A1c MFr Bld 5.9 (H) 4.8 - 5.6 %   Mean Plasma Glucose 122.63 mg/dL  Lipid panel     Status: Abnormal   Collection Time: 05/15/17  4:33 AM  Result Value Ref Range   Cholesterol 138 0 - 200 mg/dL   Triglycerides 125 <150 mg/dL   HDL 33 (L) >40 mg/dL   Total CHOL/HDL Ratio 4.2 RATIO   VLDL 25 0 - 40 mg/dL   LDL Cholesterol 80 0 - 99 mg/dL  Basic metabolic panel     Status: Abnormal   Collection Time: 05/15/17  4:33 AM  Result Value Ref Range   Sodium 136 135 - 145 mmol/L   Potassium 4.1 3.5 - 5.1 mmol/L   Chloride 108 101 - 111 mmol/L   CO2 23 22 - 32 mmol/L   Glucose, Bld 100 (H) 65 - 99 mg/dL   BUN 10 6 - 20 mg/dL   Creatinine, Ser 1.19 0.61 - 1.24 mg/dL   Calcium 8.6 (L) 8.9 - 10.3 mg/dL   GFR calc non Af Amer >60 >60 mL/min   GFR calc Af Amer >60 >60 mL/min   Anion gap 5 5 - 15   Ct Angio Head W Or Wo Contrast  Result Date: 05/14/2017 CLINICAL DATA:  Neuro deficit.  Infarct by head CT. EXAM: CT ANGIOGRAPHY HEAD AND NECK CT PERFUSION BRAIN TECHNIQUE: Multidetector CT imaging of the head and neck was performed using the standard protocol during bolus administration of intravenous contrast. Multiplanar CT image reconstructions and MIPs were obtained to evaluate the vascular anatomy. Carotid stenosis measurements (when applicable) are obtained utilizing NASCET criteria, using the distal internal carotid diameter as the denominator. Multiphase CT imaging of the brain was performed following IV bolus contrast injection. Subsequent parametric perfusion maps were calculated using RAPID software. CONTRAST:  Dose currently not available, reference EMR COMPARISON:  Noncontrast head CT earlier today at [facility], 8:26 a.m. FINDINGS: CT HEAD FINDINGS Brain: There is gray-white differentiation loss in the posterior left insula, temporal, and low frontal parietal cortex. No progression since prior. No hemorrhagic conversion. The basal ganglia and posterior limb  internal capsule or preserved. Vascular: Hyperdense vessel in the left sylvian fissure as previously noted. Skull: No acute or aggressive finding Sinuses/Orbits: Chronic sinusitis changes with polypoid features. ASPECTS ([place] Stroke Program Early CT Score) - Ganglionic level infarction (caudate, lentiform nuclei, internal capsule, insula, M1-M3 cortex): 4 (abnormal posterior insula, M1, and M2 segments) - Supraganglionic infarction (M4-M6 cortex): 2 (abnormal M6 cortex) Total score (0-10 with 10 being normal): 6 Review of the MIP images confirms the above findings CTA NECK FINDINGS Aortic arch: No acute finding or atheromatous changes. Two vessel branching. Right carotid system: Intermittent motion artifact at the level of the ICA and CCA. No stenosis, ulceration, or atheromatous changes. Negative for dissection. Left carotid system: Mild intermittent motion artifact. ICA tortuosity with mild kinking at the proximal segment. No stenosis, ulceration, or dissection. Vertebral arteries: No proximal subclavian or brachiocephalic stenosis. Codominant vertebrals  that are smooth and widely patent to the dura. Skeleton: No acute or aggressive finding. Other neck: Symmetric tonsillar prominence. No noted mass or inflammation. Upper chest: Negative Review of the MIP images confirms the above findings CTA HEAD FINDINGS Anterior circulation: Left M2 branch occlusion correlating with noncontrast CT findings. No more proximal embolus or stenosis noted. No convincing atheromatous changes or beading. Negative for aneurysm. Posterior circulation: Symmetric vertebral arteries. Smooth and widely patent vertebrobasilar system. Symmetric patency of posterior cerebral arteries with possible mild atherosclerotic luminal irregularity. Venous sinuses: Patent Anatomic variants: None significant Delayed phase: Not performed in the emergent setting. Review of the MIP images confirms the above findings CT Brain Perfusion Findings: CBF (<30%)  Volume: 55mL Perfusion (Tmax>6.0s) volume: 20mL Mismatch Volume: 29mL Infarction Location:Left inferior division MCA territory Case discussed in person 05/14/2017 at 10:51 am to Dr. Roland Rack . IMPRESSION: 1. Left M2 branch occlusion with downstream 14 cc of left temporoparietal infarct and 38 cc of ischemia by cerebral perfusion. No progression of aspects compared to CT earlier today. No acute hemorrhage. 2. No stenosis, atherosclerosis, or embolic source identified in the neck. 3. Minimal if any atheromatous changes in the intracranial circulation. Electronically Signed   By: Monte Fantasia M.D.   On: 05/14/2017 11:05   Ct Angio Neck W Or Wo Contrast  Result Date: 05/14/2017 CLINICAL DATA:  Neuro deficit.  Infarct by head CT. EXAM: CT ANGIOGRAPHY HEAD AND NECK CT PERFUSION BRAIN TECHNIQUE: Multidetector CT imaging of the head and neck was performed using the standard protocol during bolus administration of intravenous contrast. Multiplanar CT image reconstructions and MIPs were obtained to evaluate the vascular anatomy. Carotid stenosis measurements (when applicable) are obtained utilizing NASCET criteria, using the distal internal carotid diameter as the denominator. Multiphase CT imaging of the brain was performed following IV bolus contrast injection. Subsequent parametric perfusion maps were calculated using RAPID software. CONTRAST:  Dose currently not available, reference EMR COMPARISON:  Noncontrast head CT earlier today at [facility], 8:26 a.m. FINDINGS: CT HEAD FINDINGS Brain: There is gray-white differentiation loss in the posterior left insula, temporal, and low frontal parietal cortex. No progression since prior. No hemorrhagic conversion. The basal ganglia and posterior limb internal capsule or preserved. Vascular: Hyperdense vessel in the left sylvian fissure as previously noted. Skull: No acute or aggressive finding Sinuses/Orbits: Chronic sinusitis changes with polypoid  features. ASPECTS ([place] Stroke Program Early CT Score) - Ganglionic level infarction (caudate, lentiform nuclei, internal capsule, insula, M1-M3 cortex): 4 (abnormal posterior insula, M1, and M2 segments) - Supraganglionic infarction (M4-M6 cortex): 2 (abnormal M6 cortex) Total score (0-10 with 10 being normal): 6 Review of the MIP images confirms the above findings CTA NECK FINDINGS Aortic arch: No acute finding or atheromatous changes. Two vessel branching. Right carotid system: Intermittent motion artifact at the level of the ICA and CCA. No stenosis, ulceration, or atheromatous changes. Negative for dissection. Left carotid system: Mild intermittent motion artifact. ICA tortuosity with mild kinking at the proximal segment. No stenosis, ulceration, or dissection. Vertebral arteries: No proximal subclavian or brachiocephalic stenosis. Codominant vertebrals that are smooth and widely patent to the dura. Skeleton: No acute or aggressive finding. Other neck: Symmetric tonsillar prominence. No noted mass or inflammation. Upper chest: Negative Review of the MIP images confirms the above findings CTA HEAD FINDINGS Anterior circulation: Left M2 branch occlusion correlating with noncontrast CT findings. No more proximal embolus or stenosis noted. No convincing atheromatous changes or beading. Negative for aneurysm. Posterior circulation: Symmetric vertebral  arteries. Smooth and widely patent vertebrobasilar system. Symmetric patency of posterior cerebral arteries with possible mild atherosclerotic luminal irregularity. Venous sinuses: Patent Anatomic variants: None significant Delayed phase: Not performed in the emergent setting. Review of the MIP images confirms the above findings CT Brain Perfusion Findings: CBF (<30%) Volume: 22mL Perfusion (Tmax>6.0s) volume: 76mL Mismatch Volume: 89mL Infarction Location:Left inferior division MCA territory Case discussed in person 05/14/2017 at 10:51 am to Dr. Roland Rack  . IMPRESSION: 1. Left M2 branch occlusion with downstream 14 cc of left temporoparietal infarct and 38 cc of ischemia by cerebral perfusion. No progression of aspects compared to CT earlier today. No acute hemorrhage. 2. No stenosis, atherosclerosis, or embolic source identified in the neck. 3. Minimal if any atheromatous changes in the intracranial circulation. Electronically Signed   By: Monte Fantasia M.D.   On: 05/14/2017 11:05   Ct Cerebral Perfusion W Contrast  Result Date: 05/14/2017 CLINICAL DATA:  Neuro deficit.  Infarct by head CT. EXAM: CT ANGIOGRAPHY HEAD AND NECK CT PERFUSION BRAIN TECHNIQUE: Multidetector CT imaging of the head and neck was performed using the standard protocol during bolus administration of intravenous contrast. Multiplanar CT image reconstructions and MIPs were obtained to evaluate the vascular anatomy. Carotid stenosis measurements (when applicable) are obtained utilizing NASCET criteria, using the distal internal carotid diameter as the denominator. Multiphase CT imaging of the brain was performed following IV bolus contrast injection. Subsequent parametric perfusion maps were calculated using RAPID software. CONTRAST:  Dose currently not available, reference EMR COMPARISON:  Noncontrast head CT earlier today at [facility], 8:26 a.m. FINDINGS: CT HEAD FINDINGS Brain: There is gray-white differentiation loss in the posterior left insula, temporal, and low frontal parietal cortex. No progression since prior. No hemorrhagic conversion. The basal ganglia and posterior limb internal capsule or preserved. Vascular: Hyperdense vessel in the left sylvian fissure as previously noted. Skull: No acute or aggressive finding Sinuses/Orbits: Chronic sinusitis changes with polypoid features. ASPECTS ([place] Stroke Program Early CT Score) - Ganglionic level infarction (caudate, lentiform nuclei, internal capsule, insula, M1-M3 cortex): 4 (abnormal posterior insula, M1, and M2  segments) - Supraganglionic infarction (M4-M6 cortex): 2 (abnormal M6 cortex) Total score (0-10 with 10 being normal): 6 Review of the MIP images confirms the above findings CTA NECK FINDINGS Aortic arch: No acute finding or atheromatous changes. Two vessel branching. Right carotid system: Intermittent motion artifact at the level of the ICA and CCA. No stenosis, ulceration, or atheromatous changes. Negative for dissection. Left carotid system: Mild intermittent motion artifact. ICA tortuosity with mild kinking at the proximal segment. No stenosis, ulceration, or dissection. Vertebral arteries: No proximal subclavian or brachiocephalic stenosis. Codominant vertebrals that are smooth and widely patent to the dura. Skeleton: No acute or aggressive finding. Other neck: Symmetric tonsillar prominence. No noted mass or inflammation. Upper chest: Negative Review of the MIP images confirms the above findings CTA HEAD FINDINGS Anterior circulation: Left M2 branch occlusion correlating with noncontrast CT findings. No more proximal embolus or stenosis noted. No convincing atheromatous changes or beading. Negative for aneurysm. Posterior circulation: Symmetric vertebral arteries. Smooth and widely patent vertebrobasilar system. Symmetric patency of posterior cerebral arteries with possible mild atherosclerotic luminal irregularity. Venous sinuses: Patent Anatomic variants: None significant Delayed phase: Not performed in the emergent setting. Review of the MIP images confirms the above findings CT Brain Perfusion Findings: CBF (<30%) Volume: 67mL Perfusion (Tmax>6.0s) volume: 83mL Mismatch Volume: 5mL Infarction Location:Left inferior division MCA territory Case discussed in person 05/14/2017 at 10:51 am to  Dr. Roland Rack . IMPRESSION: 1. Left M2 branch occlusion with downstream 14 cc of left temporoparietal infarct and 38 cc of ischemia by cerebral perfusion. No progression of aspects compared to CT earlier today.  No acute hemorrhage. 2. No stenosis, atherosclerosis, or embolic source identified in the neck. 3. Minimal if any atheromatous changes in the intracranial circulation. Electronically Signed   By: Monte Fantasia M.D.   On: 05/14/2017 11:05   Ct Head Code Stroke Wo Contrast  Result Date: 05/14/2017 CLINICAL DATA:  Code stroke.  Aphasia. EXAM: CT HEAD WITHOUT CONTRAST TECHNIQUE: Contiguous axial images were obtained from the base of the skull through the vertex without intravenous contrast. COMPARISON:  None. FINDINGS: Brain: There is cortical and white matter hypoattenuation consistent with acute infarction involving the left temporal lobe, left parietal lobe, and left insula. No acute intracranial hemorrhage, mass, midline shift, or extra-axial fluid collection is present. The ventricles and sulci are normal. Periventricular white matter hypoattenuation is nonspecific but may reflect very mild chronic small vessel ischemic disease. Vascular: 4 mm hyperdense focus in the left sylvian fissure consistent with an acute MCA branch vessel embolus. Questionable small left MCA embolus more proximally near the expected region of the bifurcation. Skull: No fracture or focal osseous lesion. Sinuses/Orbits: Moderate volume fluid in the left sphenoid sinus. Right sphenoid sinus mucous retention cyst. Partially visualized left maxillary sinus mucous retention cyst. Mild mucosal thickening elsewhere in the ethmoid and maxillary sinuses bilaterally. Clear mastoid air cells. Unremarkable orbits. Other: None. ASPECTS ([place] Stroke Program Early CT Score) - Ganglionic level infarction (caudate, lentiform nuclei, internal capsule, insula, M1-M3 cortex): 4 (insula, M2, and M3 involvement) - Supraganglionic infarction (M4-M6 cortex): 2 (M6 involvement) Total score (0-10 with 10 being normal): 6 IMPRESSION: 1. Moderate-sized acute left MCA infarct. Embolus involving a left MCA branch vessel in the sylvian fissure. Possible  additional embolus more proximally near the MCA bifurcation. 2. ASPECTS is 6. 3. No acute intracranial hemorrhage. These results were called by telephone at the time of interpretation on 05/14/2017 at 8:48 am to Dr. Conni Slipper , who verbally acknowledged these results. Electronically Signed   By: Logan Bores M.D.   On: 05/14/2017 08:52    Assessment/Plan: Diagnosis: left MCA infarct 1. Does the need for close, 24 hr/day medical supervision in concert with the patient's rehab needs make it unreasonable for this patient to be served in a less intensive setting? Yes 2. Co-Morbidities requiring supervision/potential complications: down's syndrome, bp control, secondary stroke complications 3. Due to bladder management, bowel management, safety, skin/wound care, disease management, medication administration and patient education, does the patient require 24 hr/day rehab nursing? Yes 4. Does the patient require coordinated care of a physician, rehab nurse, PT (1-2 hrs/day, 5 days/week), OT (1-2 hrs/day, 5 days/week) and SLP (1-2 hrs/day, 5 days/week) to address physical and functional deficits in the context of the above medical diagnosis(es)? Yes Addressing deficits in the following areas: balance, endurance, locomotion, strength, transferring, bowel/bladder control, bathing, dressing, feeding, grooming, toileting, cognition, speech, language, swallowing and psychosocial support 5. Can the patient actively participate in an intensive therapy program of at least 3 hrs of therapy per day at least 5 days per week? Yes 6. The potential for patient to make measurable gains while on inpatient rehab is good 7. Anticipated functional outcomes upon discharge from inpatient rehab are supervision  with PT, supervision and min assist with OT, supervision, min assist and mod assist with SLP. 8. Estimated rehab length of stay to  reach the above functional goals is: 11-18 days 9. Anticipated D/C setting:  Home 10. Anticipated post D/C treatments: Berger therapy 11. Overall Rehab/Functional Prognosis: excellent  RECOMMENDATIONS: This patient's condition is appropriate for continued rehabilitative care in the following setting: CIR Patient has agreed to participate in recommended program. N/A Note that insurance prior authorization may be required for reimbursement for recommended care.  Comment: Rehab Admissions Coordinator to follow up.  Thanks,  Meredith Staggers, MD, Mellody Drown    Cathlyn Parsons., PA-C 05/15/2017

## 2017-05-15 NOTE — Progress Notes (Signed)
  Echocardiogram 2D Echocardiogram has been performed.  Mario Liu 05/15/2017, 12:29 PM

## 2017-05-15 NOTE — Evaluation (Signed)
Occupational Therapy Evaluation Patient Details Name: Mario Liu MRN: [id number] DOB: 1961/01/24 Today's Date: 05/15/2017    History of Present Illness Mario Liu is a 56 y.o. male with a history of depression, hypothyroidism, some type of cognitive disorder/Down Syndrome which requires him to live in a group home. PMH also includes: PMH includes: COPD (chronic obstructive pulmonary disease); hypoactivethyroidism, Kidney cyst, major depressive disorder, prior tobacco abuse, Vit D deficiency. He was admitted with Dx: aphasia, L MCA Infarct. Per his sister, he had somewhat garbled speech prior to this admit, but it is now worse.    Clinical Impression   Pt is a 56 y/o male admitted as above. He currently demonstrates deficits in his ability to perform ADL's and self care tasks to his prior ability (See OT problem list below). He lives in a group home and per his sister, does his own grooming, ADL's w/ assist for Cooking/cleaning. He goes to school 5x/week and has chores at the school per his sister as well.  Pt is with noted L gaze preference and difficulty attending to R side as well as difficulty following 1 step commands. Cont to assess if this is related to L MCA Infarct or a combination of this as well as his cognitive deficits.    Follow Up Recommendations  SNF;Supervision/Assistance - 24 hour (May benefit from CIR consult as goal is to return to [place] per Sister report)    Equipment Recommendations  Other (comment) (Cont to assess in functional context)    Recommendations for Other Services       Precautions / Restrictions Precautions Precautions: Fall Restrictions Weight Bearing Restrictions: No      Mobility Bed Mobility Overal bed mobility: Needs Assistance Bed Mobility: Supine to Sit;Sit to Supine     Supine to sit: Supervision Sit to supine: Min guard      Transfers Overall transfer level: Needs assistance Equipment used: 1 person hand held  assist Transfers: Sit to/from [facility] Sit to Stand: Min assist Stand pivot transfers: Min assist       General transfer comment: Pt with noted L gaze perference and did not attend to right during transfers and functional mobility.    Balance Overall balance assessment: Needs assistance Sitting-balance support: Single extremity supported;Feet supported Sitting balance-Leahy Scale: Good     Standing balance support: Single extremity supported;No upper extremity supported Standing balance-Leahy Scale: Fair                             ADL either performed or assessed with clinical judgement   ADL Overall ADL's : Needs assistance/impaired Eating/Feeding: NPO   Grooming: Wash/dry hands;Wash/dry face;Standing;Minimal assistance Grooming Details (indicate cue type and reason): Gaze preference to the left. Requires increased vc's to look to right during ADL's  Upper Body Bathing: Minimal assistance;Sitting;Set up   Lower Body Bathing: Min guard;Sitting/lateral leans;Sit to/from stand;Cueing for safety   Upper Body Dressing : Minimal assistance;Sitting;Cueing for safety   Lower Body Dressing: Moderate assistance;Sit to/from stand   Toilet Transfer: Ambulation;Comfort height toilet;Minimal assistance (+1 hand held assist initially, then min quard while standing at toilet to urinate)   Toileting- Water quality scientist and Hygiene: Min guard;Sit to/from stand       Functional mobility during ADLs: Supervision/safety;Min guard;Cueing for safety (+1 hand held assist with vc's for safety when ambulating in room for ADL's) General ADL Comments: Pt was assessed for OT followed by participation in ADL retraining  session to include toileting, LB dressing, washing hands/face standing at sink and some bathing sitting at EOB. Pt is with noted L gaze preference and difficulty attending to R side as well as difficulty following 1 step commands. Cont to assess if this  is related to L MCA Infarct or a combination of this as well as his cognitive deficits.     Vision Patient Visual Report: Other (comment) (Difficult to assess secondary to Cog status. Pt appeared to indicate blurred vision but this was unclear) Vision Assessment?: Vision impaired- to be further tested in functional context;Yes Alignment/Gaze Preference: Gaze left Visual Fields: Right visual field deficit Additional Comments: Pt with apparent L gaze perference and does attend to right     Perception     Praxis      Pertinent Vitals/Pain Pain Assessment: Faces Faces Pain Scale: Hurts worst Pain Location: Pt indicates front and top of head Pain Descriptors / Indicators: Headache Pain Intervention(s): Limited activity within patient's tolerance;Monitored during session;Patient requesting pain meds-RN notified     Hand Dominance Left   Extremity/Trunk Assessment Upper Extremity Assessment Upper Extremity Assessment: Overall WFL for tasks assessed (Difficulty following commands but overall appears to be WFL's)   Lower Extremity Assessment Lower Extremity Assessment: Defer to PT evaluation   Cervical / Trunk Assessment Cervical / Trunk Assessment: Normal   Communication Communication Communication: Other (comment) (See communication above)   Cognition Arousal/Alertness: Awake/alert Behavior During Therapy: Flat affect;WFL for tasks assessed/performed Overall Cognitive Status: History of cognitive impairments - at baseline (Pt sister states that pt has always had somewhat garbled speech, "but it is worse now" Unintelligible speech)                                 General Comments: When asked if he had any pain, pt was unintelligible but when sister asked him again, he indicated that his head hurt. He was asked "A lot or a little?" and shown hand gestures at the same time and did not respond. His sister then asked "From 1-10, how bad is it?" and Pt stated "10"  clearly.   General Comments       Exercises     Shoulder Instructions      Home Living Family/patient expects to be discharged to:: Group home                                        Prior Functioning/Environment Level of Independence: Needs assistance  Gait / Transfers Assistance Needed: Amb w/o AD per pt sister ADL's / Homemaking Assistance Needed: Lives in group home, does own ADL's, bathes/dress/toileting. Has assistance with Meals and homaking. Has chores; 5 days per week goes to school and has jobs there as well Communication / Swallowing Assistance Needed: Pt has always had some garbled speech but sister states that he was able to be understood if you asked him a question a second time. Aphasia          OT Problem List: Decreased cognition;Pain;Impaired vision/perception;Cardiopulmonary status limiting activity;Decreased activity tolerance;Decreased knowledge of use of DME or AE      OT Treatment/Interventions: Self-care/ADL training;DME and/or AE instruction;Therapeutic activities;Cognitive remediation/compensation;Visual/perceptual remediation/compensation;Patient/family education    OT Goals(Current goals can be found in the care plan section) Acute Rehab OT Goals Patient Stated Goal: Pt unable, per Sister, goal is to return to  Group Home, Mod I ADL's w/ assist for Homemaking, chores and School 5x/week OT Goal Formulation: Patient unable to participate in goal setting Time For Goal Achievement: 05/29/17 Potential to Achieve Goals: Fair  OT Frequency: Min 2X/week   Barriers to D/C:            Co-evaluation              AM-PAC PT "6 Clicks" Daily Activity     Outcome Measure Help from another person eating meals?:  (NPO) Help from another person taking care of personal grooming?: A Little Help from another person toileting, which includes using toliet, bedpan, or urinal?: A Little Help from another person bathing (including washing, rinsing,  drying)?: A Little Help from another person to put on and taking off regular upper body clothing?: A Little Help from another person to put on and taking off regular lower body clothing?: A Lot 6 Click Score: 14   End of Session Nurse Communication: Mobility status;Other (comment) (Aphasia and communication difficulty compounded by cognitive deficits, however he uses guestures to answer simple questions at times.)  Activity Tolerance: Patient tolerated treatment well Patient left: in bed;with call bell/phone within reach;with nursing/sitter in room;with family/visitor present  OT Visit Diagnosis: Other symptoms and signs involving the nervous system (R29.898);Other symptoms and signs involving cognitive function;Cognitive communication deficit (R41.841) Symptoms and signs involving cognitive functions: Cerebral infarction                Time: 0539-7673 OT Time Calculation (min): 38 min Charges:  OT General Charges $OT Visit: 1 Visit OT Evaluation $OT Eval Moderate Complexity: 1 Mod OT Treatments $Self Care/Home Management : 8-22 mins G-Codes:      Modesty Rudy Beth Dixon, OTR/L 05/15/2017, 11:19 AM

## 2017-05-15 NOTE — Progress Notes (Signed)
STROKE TEAM PROGRESS NOTE   HISTORY OF PRESENT ILLNESS (per record)  Mario Liu is a 56yo male with PMH significant for depression, Down's syndrome (currently living in a group home), COPD, and tobacco use who presents with aphasia since this morning at around 7am. History obtained from group home caregivers. Patient was in his usual state of health last night before he went to bed around 10pm. He was woken this morning and got up to get dressed. He came down to the kitchen and his caregiver noted that he was holding his underwear and a shirt in his hands, was not talking, and kept looking to his left side. No gait abnormalities. Caregivers state that he has not complained of any chest pain, palpitations, trouble breathing, fevers, or other symptoms recently. Patient has never had a stroke before. They called EMS and he was brought to [facility]. Head CT at [facility] demonstrated hypodensity in L MCA territory, consistent with infarct. He was transferred to [facility] for further evaluation and potential intervention.  At baseline, patient is able to walk on his own, talk, dress himself, do some chores, but unable to complete activities of daily leaving, including cooking, cleaning, and needs some help with bathing. On interviewing the patient, he endorses that he is hungry, has trouble breathing, and his left shoulder hurts. Denies chest pain, abdominal pain, or headaches. Caregivers state that he did recognize who they were and that his speech is still different than it has been. He does smoke ~1ppd, no alcohol use or other illicit drugs. His sister, Mario Liu, is his legal guardian. She lives in [place] and is on her way to [place].  He has never had a stroke before, but caregivers state that a few years ago, he did have a seizure that lasted a few minutes where his whole body was tremoring/shaking. He came out of the seizure on his own and was confused for a little but returned to baseline soon after. He has never had  another seizure.  Patient was not administered IV t-PA secondary to unclear time of onset.    SUBJECTIVE (INTERVAL HISTORY) His sister is at bedside.  She says that Mario Liu lives in a group home in [place], while she lives in [place].   Mario Liu is a former smoker and was given a tablet to help quit smoking which was started in march. It is unclear when the patient quit smoking.   This morning, Mario Liu was able to walk to the bathroom with the assistance of the nurse and the nurse did not report any gait abnormality. Mario Liu was sitting in the chair when we examined.   Sister says that Mario Liu has Downs syndrome as a child with baseline speech deficits, but says now his words are running together when he speaks. Mario Liu was adopted at young age, sister is not his biological sister.    OBJECTIVE Temp:  [93.7 F (34.3 C)-97.9 F (36.6 C)] 97.7 F (36.5 C) (08/31 0846) Pulse Rate:  [63-80] 80 (08/31 0846) Cardiac Rhythm: Normal sinus rhythm (08/31 0800) Resp:  [14-20] 16 (08/31 0846) BP: (103-129)/(65-93) 114/70 (08/31 0846) SpO2:  [95 %-100 %] 98 % (08/31 0846)  CBC:  Recent Labs Lab 05/14/17 0815  WBC 5.3  NEUTROABS 2.1  HGB 13.6  HCT 40.7  MCV 85.3  PLT 123*    Basic Metabolic Panel:  Recent Labs Lab 05/14/17 0815 05/15/17 0433  NA 134* 136  K 5.0 4.1  CL 102 108  CO2 26 23  GLUCOSE 120* 100*  BUN 17 10  CREATININE 1.64* 1.19  CALCIUM 9.3 8.6*    Lipid Panel:    Component Value Date/Time   CHOL 138 05/15/2017 0433   TRIG 125 05/15/2017 0433   HDL 33 (L) 05/15/2017 0433   CHOLHDL 4.2 05/15/2017 0433   VLDL 25 05/15/2017 0433   LDLCALC 80 05/15/2017 0433   HgbA1c:  Lab Results  Component Value Date   HGBA1C 5.9 (H) 05/15/2017   Urine Drug Screen:    Component Value Date/Time   LABOPIA NONE DETECTED 05/14/2017 0840   COCAINSCRNUR NONE DETECTED 05/14/2017 0840   LABBENZ NONE DETECTED 05/14/2017 0840   AMPHETMU NONE DETECTED 05/14/2017 0840   THCU NONE DETECTED 05/14/2017  0840   LABBARB NONE DETECTED 05/14/2017 0840    Alcohol Level     Component Value Date/Time   ETH <5 05/14/2017 0841    IMAGING I have personally reviewed the radiological images below and agree with the radiology interpretations.  Ct Angio head and Neck W Or Wo Contrast 05/14/2017 IMPRESSION: 1. Left M2 branch occlusion with downstream 14 cc of left temporoparietal infarct and 38 cc of ischemia by cerebral perfusion. No progression of aspects compared to CT earlier today. No acute hemorrhage. 2. No stenosis, atherosclerosis, or embolic source identified in the neck. 3. Minimal if any atheromatous changes in the intracranial circulation.   Ct Head Code Stroke Wo Contrast 05/14/2017 IMPRESSION: 1. Moderate-sized acute left MCA infarct. Embolus involving a left MCA branch vessel in the sylvian fissure. Possible additional embolus more proximally near the MCA bifurcation. 2. ASPECTS is 6. 3. No acute intracranial hemorrhage.   LE venous doppler - Preliminary findings: No evidence of DVT. Left baker's cyst noted.  TTE - - Left ventricle: The cavity size was normal. Wall thickness was   normal. Systolic function was normal. The estimated ejection   fraction was in the range of 55% to 60%. - Mitral valve: There was mild regurgitation. - Left atrium: The atrium was mildly dilated. - Atrial septum: With injection of agitated saline with cough there   were bubbles seen in left sided chambers consistent with small   PFO/ASD. Interatrial septum is hypermobile.  PHYSICAL EXAM  Temp:  [93.7 F (34.3 C)-98.2 F (36.8 C)] 98.2 F (36.8 C) (08/31 2137) Pulse Rate:  [64-80] 77 (08/31 2137) Resp:  [16-19] 18 (08/31 2137) BP: (100-127)/(62-82) 100/62 (08/31 2137) SpO2:  [95 %-100 %] 95 % (08/31 2137)  General - Well nourished, well developed, in no apparent distress.  Ophthalmologic - Fundi not visualized due to noncooperation.  Cardiovascular - Regular rate and rhythm.  Mental Status -   Awake alert, orientated to hospital and people but not orientated to time  Follow some simple commands but not all of them, paucity of speech, severe dysarthria. Not cooperative on naming or repeating  Cranial Nerves II - XII - II - blinking to visual threat bilaterally. III, IV, VI - Extraocular movements intact. V - Facial sensation intact bilaterally. VII - right facial droop. VIII - Hearing & vestibular intact bilaterally. X - Palate elevates symmetrically. XI - Chin turning & shoulder shrug intact bilaterally. XII - Tongue protrusion intact.  Motor Strength - The patient's strength was normal in all extremities and pronator drift was absent.  Bulk was normal and fasciculations were absent.   Motor Tone - Muscle tone was assessed at the neck and appendages and was normal.  Reflexes - The patient's reflexes were 1+ in all extremities and he had no pathological  reflexes.  Sensory - Light touch, temperature/pinprick, vibration and proprioception were assessed and were symmetrical.    Coordination - The patient had normal movements in the hands with no ataxia or dysmetria.  Tremor was absent.  Gait and Station - The patient's transfers, posture, gait, station, and turns were observed as normal.   ASSESSMENT/PLAN Mr. Jalene Lacko is a 56 y.o. male with history of smoking, downs syndrome, COPD presenting with R sided facial droop and aphasia . He did not receive IV t-PA due to uncertain time of event.   Stroke - Moderate-sized acute left MCA infarct with left M2 cut off. Etiology not clear, could be due to PFO vs. Other cardioembolic source. However, due to Mario Liu cognitive impairment with down syndrome, do not think Mario Liu is good candidate for PFO closure or loop recorder. Recommend 30 day cardiac event monitoring to rule out afib.   Resultant  r sided facial droop and aphasia   CT head - moderate sized acute left MCA infarct  CTA head and neck with perfusion - Left M2 branch  occlusion  MRI brain pending  2D Echo with bubble: EF normal, small PFO/ASD present. Hypermobile intraatrial septum  LE venous doppler - negative PFO  Recommend 30 day cardiac event monitoring at outpt to rule out afib  LDL 80  HgbA1c 5.9  lovenox for VTE prophylaxis  Diet NPO time specified  No antithrombotic prior to admission, now on aspirin 325 mg daily and plavix. Continue DAPT for 3 months and then plavix alone.   Patient counseled to be compliant with his antithrombotic medications  Ongoing aggressive stroke risk factor management  Therapy recommendations: CIR    Disposition:  Pending  PFO  Shown on TTE with bubble study  Also hypermobile intraatrial septum  May not be a good candidate for PFO closure due to down syndrome with cognitive impairment.  PFO related stroke has low recurrence overall   Continue antiplatelet therapy as above.  Hypertension  Stable  Permissive hypertension (OK if < 220/120) but gradually normalize in 5-7 days  Long-term BP goal normotensive  Hyperlipidemia  Home meds:  none  LDL 80, goal < 70  Now on lipitor 40mg   Continue statin at discharge  Other Stroke Risk Factors  Former Cigarette smoker- on chantix, now stopped smoking  History of downs syndrome- presence of PFO/ASD  Other Active Problems  COPD  Hospital day # 1  Neurology will sign off. Please call with questions. Mario Liu will follow up with Dr. Erlinda Hong at [facility] in about 6 weeks. Thanks for the consult.   Rosalin Hawking, MD PhD Stroke Neurology 05/15/2017 11:06 PM  To contact Stroke Continuity provider, please refer to [email or web address] After hours, contact General Neurology

## 2017-05-15 NOTE — Progress Notes (Signed)
*  PRELIMINARY RESULTS* Vascular Ultrasound Lower extremity venous duplex has been completed.  Preliminary findings: No evidence of DVT. Left baker's cyst noted.   Landry Mellow, RDMS, RVT  05/15/2017, 1:38 PM

## 2017-05-15 NOTE — Progress Notes (Signed)
Rehab Admissions Coordinator Note:  Patient was screened by Cleatrice Burke for appropriateness for an Inpatient Acute Rehab Consult per OT recommendation.  At this time, we are recommending Inpatient Rehab consult if you would like pt to be considered for an inpt rehab admit prior to return to group home instead of SNF placement. Please advise.  Cleatrice Burke 05/15/2017, 1:58 PM  I can be reached at [phone].

## 2017-05-15 NOTE — Evaluation (Signed)
Speech Language Pathology Evaluation Patient Details Name: Mario Liu MRN: [id number] DOB: May 05, 1961 Today's Date: 05/15/2017 Time: 9563-8756 SLP Time Calculation (min) (ACUTE ONLY): 30 min  Problem List:  Patient Active Problem List   Diagnosis Date Noted  . Acute ischemic left middle cerebral artery (MCA) stroke ([place])   . Down's syndrome   . CVA (cerebral vascular accident) ([place]) 05/14/2017   Past Medical History:  Past Medical History:  Diagnosis Date  . COPD (chronic obstructive pulmonary disease) ([place])   . Hx of cholecystectomy   . Hypothyroidism   . Kidney cysts   . Major depressive disorder   . Tobacco abuse   . Vitamin D deficiency    Past Surgical History: History reviewed. No pertinent surgical history. HPI:  Mario Liu is a 56yo male with PMH significant for depression, Down's syndrome (currently living in a group home), COPD, and tobacco use who presents with aphasia since this morning at around 7am. History obtained from group home caregivers. Patient was in his usual state of health last night before he went to bed around 10pm. He was woken this morning and got up to get dressed. He came down to the kitchen and his caregiver noted that he was holding his underwear and a shirt in his hands, was not talking, and kept looking to his left side. Head CT at [facility] demonstrated hypodensity in L MCA territory, consistent with infarct. At baseline, patient is able to walk on his own, talk, dress himself, do some chores, but unable to complete activities of daily leaving, including cooking, cleaning, and needs some help with bathing. On interviewing the patient, he endorses that he is hungry, has trouble breathing, and his left shoulder hurts. Denies chest pain, abdominal pain, or headaches. Caregivers state that he did recognize who they were and that his speech is still different than it has been. He does smoke ~1ppd, no alcohol use or other illicit drugs. His sister, Mario Liu, is his  legal guardian. She lives in [place] and is on her way to [place].   Assessment / Plan / Recommendation Clinical Impression  Pt demonstrates deficits following left CVA including moderate dysarthria and aphasia with primary expressive impairment. Pts speech at word level is distorted beyond baseline and is intermittently intelligible to familiar listeners. Pt also noted to have telegraphic speech, simplifying or omitting syntax and favoring one word responses. Minimal errors with confrontational naming. Sister reports this is also different from baseline and SLP initiated education regarding aphasia. Pt will benefit from f/u in the recommended next level of care, likely SNF.     SLP Assessment  SLP Recommendation/Assessment: Patient needs continued Speech Lanaguage Pathology Services SLP Visit Diagnosis: Aphasia (R47.01);Dysarthria and anarthria (R47.1)    Follow Up Recommendations  Skilled Nursing facility    Frequency and Duration min 2x/week  2 weeks      SLP Evaluation Cognition  Overall Cognitive Status: History of cognitive impairments - at baseline Arousal/Alertness: Awake/alert Orientation Level: Oriented to person Attention: Focused;Sustained Focused Attention: Appears intact Sustained Attention: Impaired Sustained Attention Impairment: Verbal basic;Functional basic Awareness: Impaired Awareness Impairment: Intellectual impairment Problem Solving: Impaired Problem Solving Impairment: Verbal basic;Functional basic Safety/Judgment: Impaired       Comprehension  Auditory Comprehension Overall Auditory Comprehension: Impaired Yes/No Questions: Impaired Basic Biographical Questions: 0-25% accurate Commands: Impaired One Step Basic Commands: 50-74% accurate Conversation: Simple Interfering Components: Working memory;Processing speed;Attention EffectiveTechniques: Repetition;Visual/Gestural cues Visual Recognition/Discrimination Discrimination: Not tested Reading  Comprehension Reading Status: Not tested    Expression Verbal  Expression Overall Verbal Expression: Impaired Initiation: No impairment Automatic Speech: Name;Social Response Level of Generative/Spontaneous Verbalization: Word;Phrase Repetition: Impaired Level of Impairment: Phrase level Naming: Impairment Responsive: Not tested Confrontation: Impaired Verbal Errors: Other (comment) (telegraphic speech) Pragmatics:  (baseline deficits) Interfering Components: Premorbid deficit;Speech intelligibility Written Expression Dominant Hand: Left   Oral / Motor  Oral Motor/Sensory Function Overall Oral Motor/Sensory Function: Mild impairment Facial ROM: Within Functional Limits Facial Symmetry: Within Functional Limits Facial Strength: Within Functional Limits Facial Sensation: Reduced left Lingual ROM: Within Functional Limits Lingual Symmetry: Within Functional Limits Lingual Strength: Reduced Lingual Sensation: Reduced Motor Speech Overall Motor Speech: Impaired Respiration: Within functional limits Phonation: Normal Resonance: Within functional limits Articulation: Impaired Level of Impairment: Word Intelligibility: Intelligibility reduced Word: 25-49% accurate Phrase: 25-49% accurate Sentence: 0-24% accurate Conversation: 0-24% accurate Motor Planning: Witnin functional limits Motor Speech Errors: Mario Good, MA CCC-SLP [phone]  Mario Liu 05/15/2017, 12:35 PM

## 2017-05-15 NOTE — Progress Notes (Signed)
Modified Barium Swallow Progress Note  Patient Details  Name: Mario Liu MRN: [id number] Date of Birth: 1960-10-30  Today's Date: 05/15/2017  Modified Barium Swallow completed.  Full report located under Chart Review in the Imaging Section.  Brief recommendations include the following:  Clinical Impression  Pt demonstrates a primary oral dysphagia with right buccal, lingual weakness and presumed lack of sensation. Lingual propulsion of bolus is is weak, slow, and inefficient. Pt compensates with a sucking behavior to aid in transit, but this further contributes to premature spillage of liquids to the pyriform sinuses before the swallow. One swallow is initiated function is adequate to clear bolus through pharynx without residue though moderate oral residuals pill post swallow and were aspirated both with and without sensation. Residuals are worse with nectar and chin tuck worsens anterior spillage due insufficient labial closure. A second swallow is helpful, but does not eliminate risk of aspiration and will not be consistent unless constant supervision is given. Overall, pt expected to be at risk of aspiration with all liquid intake and with his own secretions. Given this risk, best practice warrants frequent oral care to reduce bacterial load and water only to drink to reduce ima pct of trace aspiration events. Expect gradual improvement of oral function with time and SLP interventions, but for now, pt to consume a dys 2 (finely chopped) diet with thin liquids with oral care preceding and after meals. Supervision to be given to encourage second swallow and to guide pt in oral care. Discussed risk with pts sister and recommended plan of care. She is in agreement and will try to assist with precautions.    Swallow Evaluation Recommendations       SLP Diet Recommendations: Dysphagia 2 (Fine chop) solids;Thin liquid   Liquid Administration via: Cup;Straw   Medication Administration: Whole meds  with puree   Supervision: Full supervision/cueing for compensatory strategies   Compensations: Slow rate;Small sips/bites;Multiple dry swallows after each bite/sip;Minimize environmental distractions   Postural Changes: Seated upright at 90 degrees   Oral Care Recommendations: Oral care before and after PO   Other Recommendations: Have oral suction available   Herbie [place], MA CCC-SLP [phone]  Lynann Beaver 05/15/2017,3:04 PM

## 2017-05-15 NOTE — Progress Notes (Signed)
Internal Medicine Attending  Date: 05/15/2017  Patient name: Mario Liu Medical record number: [id number] Date of birth: 11/23/1960 Age: 56 y.o. Gender: male  I saw and evaluated the patient. I reviewed the resident's note by Dr. Ronalee Red and I agree with the resident's findings and plans as documented in her progress note.  Please see my H&P dated 05/15/2017 for the specifics of my evaluation, assessment, and plan from earlier in the day.

## 2017-05-15 NOTE — Progress Notes (Signed)
Inpatient Rehabilitation  Please refer to consult by Dr. Naaman Plummer for full details; however, patient makes a good IP Rehab candidate.  Plan to follow up with patient and team on Monday with hopeful IP Rehab bed offer.  Call if questions.  Carmelia Roller., CCC/SLP Admission Coordinator  [place]  Cell [phone]

## 2017-05-15 NOTE — Progress Notes (Signed)
   Subjective:  No acute events overnight. Mario Liu, his sister and legal guardian, is at bedside. She reports that he continues to have a little bit of difficulty speaking, but that it is improved from yesterday. His speech does seem to be a little better than yesterday, although not back to baseline, according to his sister. He denies pain or trouble breathing. States that he is hungry.  Objective:  Vital signs in last 24 hours: Vitals:   05/15/17 0222 05/15/17 0430 05/15/17 0630 05/15/17 0846  BP: 127/73 108/66 103/65 114/70  Pulse: 68 65 64 80  Resp: 19 17 18 16   Temp: (!) 93.7 F (34.3 C) 97.8 F (36.6 C) 97.8 F (36.6 C) 97.7 F (36.5 C)  TempSrc: Oral Oral Oral Oral  SpO2: 96% 95% 98% 98%   GEN: AA male lying in bed; sleeping comfortably; awakens to voice CV: Normal rate and regular rhythm. No murmurs, gallops, or rubs. No LE edema. ABD: Soft. Non-tender. Non-distended. Normoactive bowel sounds. EXT: No edema. NEURO: normal facial sensation this morning; R sided facial droop; difficult to assess strength due to limited patient cooperation, but seems symmetric in bilateral UE and LE. Dysarthric speech  Labs CBC Latest Ref Rng & Units 05/14/2017 04/20/2017  WBC 3.8 - 10.6 K/uL 5.3 4.7  Hemoglobin 13.0 - 18.0 g/dL 13.6 12.6(L)  Hematocrit 40.0 - 52.0 % 40.7 37.1(L)  Platelets 150 - 440 K/uL 123(L) 128(L)   BMP Latest Ref Rng & Units 05/15/2017 05/14/2017 04/20/2017  Glucose 65 - 99 mg/dL 100(H) 120(H) 114(H)  BUN 6 - 20 mg/dL 10 17 18   Creatinine 0.61 - 1.24 mg/dL 1.19 1.64(H) 1.19  Sodium 135 - 145 mmol/L 136 134(L) 135  Potassium 3.5 - 5.1 mmol/L 4.1 5.0 3.6  Chloride 101 - 111 mmol/L 108 102 100(L)  CO2 22 - 32 mmol/L 23 26 28   Calcium 8.9 - 10.3 mg/dL 8.6(L) 9.3 9.2   Chol 138, TG 125, HDL 33, LDL 80 A1c 5.9 HCV, HIV, HBV pending  Assessment/Plan:  Active Problems:   CVA (cerebral vascular accident) [facility])  Mario Liu is a 56yo male with PMH significant for  depression, Down's syndrome (currently living in a group home), COPD, and tobacco use who presents with acute onset of aphasia, R facial droop, decreased facial sensation on right, and left gaze preference. CT imaging demonstrates left M2 branch occlusion with L MCA infarct. Continues to have R facial droop and expressive aphasia.  # L MCA infarct Etiology likely embolic. With Down syndrome, has increased risk of ASD/VSD. UE and LE strength grossly intact this morning, but physical exam is limited by patient's ability to cooperate. Still has R facial droop and expressive aphasia. History is difficult to obtain from patient, but he denies pain or trouble breathing. Does state that he is hungry. Upper airway sounds/transient pauses in breathing improved today. - 1-year ASCVD risk at least 13.1% - will likely need statin - A1c 5.9 - f/u MRI brain - f/u echocardiogram bubble study - aspirin - PT/OT, speech - NPO until speech eval - hold home lasix  # COPD Patient without shortness of breath or increased oxygen requirement. - continue to monitor O2 sats  Diet: NPO VTE PPx: Lovenox Code status: Full - confirmed with legal guardian - Mario Liu (sister)  Dispo: Anticipated discharge in approximately 2-3 day(s).   Colbert Ewing, MD  Internal Medicine, PGY-1 05/15/2017, 11:02 AM Pager: Mamie Nick [phone]

## 2017-05-15 NOTE — Evaluation (Signed)
Physical Therapy Evaluation Patient Details Name: Mario Liu MRN: [id number] DOB: 1961/05/03 Today's Date: 05/15/2017   History of Present Illness  Mario Liu is a 56 y.o. male with a history of depression, hypothyroidism, some type of cognitive disorder/Down Syndrome which requires him to live in a group home. PMH also includes: PMH includes: COPD (chronic obstructive pulmonary disease); hypoactivethyroidism, Kidney cyst, major depressive disorder, prior tobacco abuse, Vit D deficiency. He was admitted with Dx: aphasia, L MCA Infarct. Per his sister, he had somewhat garbled speech prior to this admit, but it is now worse.   Clinical Impression  Pt admitted with/for s/s of stroke. Presently pt is at a min guard/supervision level for basic mobility.  .  Pt currently limited functionally due to the problems listed. ( See problems list.)   Pt will benefit from PT to maximize function and safety in order to get ready for next venue listed below.     Follow Up Recommendations Supervision/Assistance - 24 hour;CIR;Other (comment) (Short term--more speech and OT intensive)    Equipment Recommendations  None recommended by PT    Recommendations for Other Services       Precautions / Restrictions Precautions Precautions:  (minimal fall risk)      Mobility  Bed Mobility Overal bed mobility: Needs Assistance Bed Mobility: Supine to Sit;Sit to Supine     Supine to sit: Supervision Sit to supine: Supervision   General bed mobility comments: impulsive, but generally safe  Transfers Overall transfer level: Needs assistance   Transfers: Sit to/from Stand Sit to Stand: Supervision Stand pivot transfers: Supervision       General transfer comment: pt fairly safe with transfers  Ambulation/Gait Ambulation/Gait assistance: Supervision Ambulation Distance (Feet): 100 Feet (x2) Assistive device: None Gait Pattern/deviations: Step-through pattern   Gait velocity interpretation: at or  above normal speed for age/gender General Gait Details: generally steady gait, less attension given to R side, but generally negligile  Stairs            Wheelchair Mobility    Modified Rankin (Stroke Patients Only)       Balance Overall balance assessment: Needs assistance Sitting-balance support: No upper extremity supported Sitting balance-Leahy Scale: Good     Standing balance support: No upper extremity supported Standing balance-Leahy Scale: Good                               Pertinent Vitals/Pain Pain Assessment: Faces Faces Pain Scale: No hurt    Home Living Family/patient expects to be discharged to:: Group home   Available Help at Discharge: Available 24 hours/day Type of Home: Group Home                Prior Function Level of Independence: Needs assistance   Gait / Transfers Assistance Needed: Amb w/o AD per pt sister  ADL's / Homemaking Assistance Needed: Lives in group home, does own ADL's, bathes/dress/toileting. Has assistance with Meals and homaking. Has chores; 5 days per week goes to school and has jobs there as well        Hand Dominance   Dominant Hand: Left    Extremity/Trunk Assessment        Lower Extremity Assessment Lower Extremity Assessment: Overall WFL for tasks assessed;Difficult to assess due to impaired cognition    Cervical / Trunk Assessment Cervical / Trunk Assessment: Normal  Communication      Cognition Arousal/Alertness: Awake/alert Behavior During Therapy: Flat affect Overall  Cognitive Status: History of cognitive impairments - at baseline                                        General Comments General comments (skin integrity, edema, etc.): Could not do my usual Dynamic balance challenges, due to pt unable to follow direction consistently, but with scanning, direction changes and backing up he did not show any overt LOB or deviation    Exercises     Assessment/Plan     PT Assessment Patient needs continued PT services  PT Problem List Decreased safety awareness;Decreased activity tolerance;Decreased mobility       PT Treatment Interventions Gait training;Stair training;Functional mobility training;Therapeutic activities;Patient/family education    PT Goals (Current goals can be found in the Care Plan section)  Acute Rehab PT Goals Patient Stated Goal: Pt unable, per Sister, goal is to return to [place], Mod I ADL's w/ assist for Homemaking, chores and School 5x/week PT Goal Formulation: With patient Time For Goal Achievement: 05/22/17 Potential to Achieve Goals: Good    Frequency Min 2X/week   Barriers to discharge        Co-evaluation               AM-PAC PT "6 Clicks" Daily Activity  Outcome Measure Difficulty turning over in bed (including adjusting bedclothes, sheets and blankets)?: None Difficulty moving from lying on back to sitting on the side of the bed? : None Difficulty sitting down on and standing up from a chair with arms (e.g., wheelchair, bedside commode, etc,.)?: None Help needed moving to and from a bed to chair (including a wheelchair)?: A Little Help needed walking in hospital room?: A Little Help needed climbing 3-5 steps with a railing? : A Little 6 Click Score: 21    End of Session   Activity Tolerance: Patient tolerated treatment well Patient left: in chair;with call bell/phone within reach;with family/visitor present Nurse Communication: Mobility status PT Visit Diagnosis: Other abnormalities of gait and mobility (R26.89);Other symptoms and signs involving the nervous system (R29.898)    Time: 2831-5176 PT Time Calculation (min) (ACUTE ONLY): 18 min   Charges:   PT Evaluation $PT Eval Moderate Complexity: 1 Mod     PT G Codes:        05/22/17  Mario Liu, PT [phone]  (pager)  Mario Liu Mario Liu 2017-05-22, 4:45 PM

## 2017-05-15 NOTE — H&P (Signed)
Internal Medicine Attending Admission Note Date: 05/15/2017  Patient name: Mario Liu Medical record number: [id number] Date of birth: 1960-10-28 Age: 56 y.o. Gender: male  I saw and evaluated the patient. I reviewed the resident's note and I agree with the resident's findings and plan as documented in the resident's note.  Chief Complaint(s): Aphasia and change in behavior.  History - key components related to admission:  Mario Liu is a 56 year old man with a history of Down's syndrome and tobacco abuse who presents with aphasia and change in his baseline behavior. He is unable to provide a history secondary to the aphasia. Therefore, the following is based upon chart review and brief discussion with his sister who lives in [place]. Apparently, he was in his usual state of health when he went to bed at 10 PM the evening prior to admission. He was woken up by the group home caregivers the following morning, but came down to the kitchen holding his underwear and shirt in his hands, not talking, and gazing preferentially to the left. They called EMS and he was brought to the emergency department where he was found to have hypodensity in the left MCA distribution on CT scan. He was therefore admitted to the internal medicine teaching service for further evaluation and care. Per his caregivers he had no complaints prior to going to bed the evening before, and specifically denied chest pain, palpitations, dizziness, shortness of breath, or fevers.  When seen on rounds the morning following admission he was lying comfortably in bed in no acute distress. He was arousable and relatively cooperative. His speech was dysarthric and only intermittently intelligible. His sister, who was in the room, states this is worse than baseline and that he is usually comprehendible.  Physical Exam - key components related to admission:  Vitals:   05/15/17 0222 05/15/17 0430 05/15/17 0630 05/15/17 0846  BP: 127/73 108/66  103/65 114/70  Pulse: 68 65 64 80  Resp: 19 17 18 16   Temp: (!) 93.7 F (34.3 C) 97.8 F (36.6 C) 97.8 F (36.6 C) 97.7 F (36.5 C)  TempSrc: Oral Oral Oral Oral  SpO2: 96% 95% 98% 98%   Gen.: Well-developed, well-nourished, man lying comfortably in bed in no acute distress. His speech is dysarthric and intermittently intelligible. He cooperates with most aspects of the exam although does have trouble participating in the muscular strength assessment. Heart: Regular rate and rhythm without murmurs, rubs, or gallops. Abdomen: Soft, nontender, active bowel sounds. Extremities: Without edema. Neuro: Alert and cooperative. Right facial droop. As best we can tell strength is equal throughout, although the exam is limited somewhat by patient cooperation.  Lab results:  Basic Metabolic Panel:  Recent Labs  05/14/17 0815 05/15/17 0433  NA 134* 136  K 5.0 4.1  CL 102 108  CO2 26 23  GLUCOSE 120* 100*  BUN 17 10  CREATININE 1.64* 1.19  CALCIUM 9.3 8.6*   Liver Function Tests:  Recent Labs  05/14/17 0815  AST 33  ALT 18  ALKPHOS 40  BILITOT 1.0  PROT 8.0  ALBUMIN 3.9   CBC:  Recent Labs  05/14/17 0815  WBC 5.3  NEUTROABS 2.1  HGB 13.6  HCT 40.7  MCV 85.3  PLT 123*   CBG:  Recent Labs  05/14/17 0839  GLUCAP 124*   Hemoglobin A1C:  Recent Labs  05/15/17 0433  HGBA1C 5.9*   Fasting Lipid Panel:  Recent Labs  05/15/17 0433  CHOL 138  HDL 33*  LDLCALC  80  TRIG 125  CHOLHDL 4.2   Coagulation:  Recent Labs  05/14/17 0841  INR 1.04   Urine Drug Screen:  Negative for tricyclic antidepressants, amphetamines, ecstasy, cocaine, opiates, PCP, THC, barbiturates, benzodiazepines, or methadone.  Alcohol Level:  Recent Labs  05/14/17 0841  ETH <5   Urinalysis:  Clear, yellow, specific gravity 1.014, pH 6.0, hemoglobin moderate, nitrite negative, leukocytes negative, red blood cells 0-5 per high-power field, white blood cells are 5 per  high-power field.  Misc. Labs:  ABG on at unknown FiO2 7.41/37/69  Imaging results:  Ct Angio Head W Or Wo Contrast  Result Date: 05/14/2017 CLINICAL DATA:  Neuro deficit.  Infarct by head CT. EXAM: CT ANGIOGRAPHY HEAD AND NECK CT PERFUSION BRAIN TECHNIQUE: Multidetector CT imaging of the head and neck was performed using the standard protocol during bolus administration of intravenous contrast. Multiplanar CT image reconstructions and MIPs were obtained to evaluate the vascular anatomy. Carotid stenosis measurements (when applicable) are obtained utilizing NASCET criteria, using the distal internal carotid diameter as the denominator. Multiphase CT imaging of the brain was performed following IV bolus contrast injection. Subsequent parametric perfusion maps were calculated using RAPID software. CONTRAST:  Dose currently not available, reference EMR COMPARISON:  Noncontrast head CT earlier today at [facility], 8:26 a.m. FINDINGS: CT HEAD FINDINGS Brain: There is gray-white differentiation loss in the posterior left insula, temporal, and low frontal parietal cortex. No progression since prior. No hemorrhagic conversion. The basal ganglia and posterior limb internal capsule or preserved. Vascular: Hyperdense vessel in the left sylvian fissure as previously noted. Skull: No acute or aggressive finding Sinuses/Orbits: Chronic sinusitis changes with polypoid features. ASPECTS ([place] Stroke Program Early CT Score) - Ganglionic level infarction (caudate, lentiform nuclei, internal capsule, insula, M1-M3 cortex): 4 (abnormal posterior insula, M1, and M2 segments) - Supraganglionic infarction (M4-M6 cortex): 2 (abnormal M6 cortex) Total score (0-10 with 10 being normal): 6 Review of the MIP images confirms the above findings CTA NECK FINDINGS Aortic arch: No acute finding or atheromatous changes. Two vessel branching. Right carotid system: Intermittent motion artifact at the level of the ICA and CCA. No  stenosis, ulceration, or atheromatous changes. Negative for dissection. Left carotid system: Mild intermittent motion artifact. ICA tortuosity with mild kinking at the proximal segment. No stenosis, ulceration, or dissection. Vertebral arteries: No proximal subclavian or brachiocephalic stenosis. Codominant vertebrals that are smooth and widely patent to the dura. Skeleton: No acute or aggressive finding. Other neck: Symmetric tonsillar prominence. No noted mass or inflammation. Upper chest: Negative Review of the MIP images confirms the above findings CTA HEAD FINDINGS Anterior circulation: Left M2 branch occlusion correlating with noncontrast CT findings. No more proximal embolus or stenosis noted. No convincing atheromatous changes or beading. Negative for aneurysm. Posterior circulation: Symmetric vertebral arteries. Smooth and widely patent vertebrobasilar system. Symmetric patency of posterior cerebral arteries with possible mild atherosclerotic luminal irregularity. Venous sinuses: Patent Anatomic variants: None significant Delayed phase: Not performed in the emergent setting. Review of the MIP images confirms the above findings CT Brain Perfusion Findings: CBF (<30%) Volume: 24mL Perfusion (Tmax>6.0s) volume: 31mL Mismatch Volume: 66mL Infarction Location:Left inferior division MCA territory Case discussed in person 05/14/2017 at 10:51 am to Dr. Roland Rack . IMPRESSION: 1. Left M2 branch occlusion with downstream 14 cc of left temporoparietal infarct and 38 cc of ischemia by cerebral perfusion. No progression of aspects compared to CT earlier today. No acute hemorrhage. 2. No stenosis, atherosclerosis, or embolic source identified in the neck.  3. Minimal if any atheromatous changes in the intracranial circulation. Electronically Signed   By: Monte Fantasia M.D.   On: 05/14/2017 11:05   Ct Angio Neck W Or Wo Contrast  Result Date: 05/14/2017 CLINICAL DATA:  Neuro deficit.  Infarct by head CT.  EXAM: CT ANGIOGRAPHY HEAD AND NECK CT PERFUSION BRAIN TECHNIQUE: Multidetector CT imaging of the head and neck was performed using the standard protocol during bolus administration of intravenous contrast. Multiplanar CT image reconstructions and MIPs were obtained to evaluate the vascular anatomy. Carotid stenosis measurements (when applicable) are obtained utilizing NASCET criteria, using the distal internal carotid diameter as the denominator. Multiphase CT imaging of the brain was performed following IV bolus contrast injection. Subsequent parametric perfusion maps were calculated using RAPID software. CONTRAST:  Dose currently not available, reference EMR COMPARISON:  Noncontrast head CT earlier today at [facility], 8:26 a.m. FINDINGS: CT HEAD FINDINGS Brain: There is gray-white differentiation loss in the posterior left insula, temporal, and low frontal parietal cortex. No progression since prior. No hemorrhagic conversion. The basal ganglia and posterior limb internal capsule or preserved. Vascular: Hyperdense vessel in the left sylvian fissure as previously noted. Skull: No acute or aggressive finding Sinuses/Orbits: Chronic sinusitis changes with polypoid features. ASPECTS ([place] Stroke Program Early CT Score) - Ganglionic level infarction (caudate, lentiform nuclei, internal capsule, insula, M1-M3 cortex): 4 (abnormal posterior insula, M1, and M2 segments) - Supraganglionic infarction (M4-M6 cortex): 2 (abnormal M6 cortex) Total score (0-10 with 10 being normal): 6 Review of the MIP images confirms the above findings CTA NECK FINDINGS Aortic arch: No acute finding or atheromatous changes. Two vessel branching. Right carotid system: Intermittent motion artifact at the level of the ICA and CCA. No stenosis, ulceration, or atheromatous changes. Negative for dissection. Left carotid system: Mild intermittent motion artifact. ICA tortuosity with mild kinking at the proximal segment. No stenosis,  ulceration, or dissection. Vertebral arteries: No proximal subclavian or brachiocephalic stenosis. Codominant vertebrals that are smooth and widely patent to the dura. Skeleton: No acute or aggressive finding. Other neck: Symmetric tonsillar prominence. No noted mass or inflammation. Upper chest: Negative Review of the MIP images confirms the above findings CTA HEAD FINDINGS Anterior circulation: Left M2 branch occlusion correlating with noncontrast CT findings. No more proximal embolus or stenosis noted. No convincing atheromatous changes or beading. Negative for aneurysm. Posterior circulation: Symmetric vertebral arteries. Smooth and widely patent vertebrobasilar system. Symmetric patency of posterior cerebral arteries with possible mild atherosclerotic luminal irregularity. Venous sinuses: Patent Anatomic variants: None significant Delayed phase: Not performed in the emergent setting. Review of the MIP images confirms the above findings CT Brain Perfusion Findings: CBF (<30%) Volume: 26mL Perfusion (Tmax>6.0s) volume: 71mL Mismatch Volume: 59mL Infarction Location:Left inferior division MCA territory Case discussed in person 05/14/2017 at 10:51 am to Dr. Roland Rack . IMPRESSION: 1. Left M2 branch occlusion with downstream 14 cc of left temporoparietal infarct and 38 cc of ischemia by cerebral perfusion. No progression of aspects compared to CT earlier today. No acute hemorrhage. 2. No stenosis, atherosclerosis, or embolic source identified in the neck. 3. Minimal if any atheromatous changes in the intracranial circulation. Electronically Signed   By: Monte Fantasia M.D.   On: 05/14/2017 11:05   Ct Cerebral Perfusion W Contrast  Result Date: 05/14/2017 CLINICAL DATA:  Neuro deficit.  Infarct by head CT. EXAM: CT ANGIOGRAPHY HEAD AND NECK CT PERFUSION BRAIN TECHNIQUE: Multidetector CT imaging of the head and neck was performed using the standard protocol during bolus  administration of intravenous  contrast. Multiplanar CT image reconstructions and MIPs were obtained to evaluate the vascular anatomy. Carotid stenosis measurements (when applicable) are obtained utilizing NASCET criteria, using the distal internal carotid diameter as the denominator. Multiphase CT imaging of the brain was performed following IV bolus contrast injection. Subsequent parametric perfusion maps were calculated using RAPID software. CONTRAST:  Dose currently not available, reference EMR COMPARISON:  Noncontrast head CT earlier today at [facility], 8:26 a.m. FINDINGS: CT HEAD FINDINGS Brain: There is gray-white differentiation loss in the posterior left insula, temporal, and low frontal parietal cortex. No progression since prior. No hemorrhagic conversion. The basal ganglia and posterior limb internal capsule or preserved. Vascular: Hyperdense vessel in the left sylvian fissure as previously noted. Skull: No acute or aggressive finding Sinuses/Orbits: Chronic sinusitis changes with polypoid features. ASPECTS ([place] Stroke Program Early CT Score) - Ganglionic level infarction (caudate, lentiform nuclei, internal capsule, insula, M1-M3 cortex): 4 (abnormal posterior insula, M1, and M2 segments) - Supraganglionic infarction (M4-M6 cortex): 2 (abnormal M6 cortex) Total score (0-10 with 10 being normal): 6 Review of the MIP images confirms the above findings CTA NECK FINDINGS Aortic arch: No acute finding or atheromatous changes. Two vessel branching. Right carotid system: Intermittent motion artifact at the level of the ICA and CCA. No stenosis, ulceration, or atheromatous changes. Negative for dissection. Left carotid system: Mild intermittent motion artifact. ICA tortuosity with mild kinking at the proximal segment. No stenosis, ulceration, or dissection. Vertebral arteries: No proximal subclavian or brachiocephalic stenosis. Codominant vertebrals that are smooth and widely patent to the dura. Skeleton: No acute or aggressive  finding. Other neck: Symmetric tonsillar prominence. No noted mass or inflammation. Upper chest: Negative Review of the MIP images confirms the above findings CTA HEAD FINDINGS Anterior circulation: Left M2 branch occlusion correlating with noncontrast CT findings. No more proximal embolus or stenosis noted. No convincing atheromatous changes or beading. Negative for aneurysm. Posterior circulation: Symmetric vertebral arteries. Smooth and widely patent vertebrobasilar system. Symmetric patency of posterior cerebral arteries with possible mild atherosclerotic luminal irregularity. Venous sinuses: Patent Anatomic variants: None significant Delayed phase: Not performed in the emergent setting. Review of the MIP images confirms the above findings CT Brain Perfusion Findings: CBF (<30%) Volume: 63mL Perfusion (Tmax>6.0s) volume: 82mL Mismatch Volume: 83mL Infarction Location:Left inferior division MCA territory Case discussed in person 05/14/2017 at 10:51 am to Dr. Roland Rack . IMPRESSION: 1. Left M2 branch occlusion with downstream 14 cc of left temporoparietal infarct and 38 cc of ischemia by cerebral perfusion. No progression of aspects compared to CT earlier today. No acute hemorrhage. 2. No stenosis, atherosclerosis, or embolic source identified in the neck. 3. Minimal if any atheromatous changes in the intracranial circulation. Electronically Signed   By: Monte Fantasia M.D.   On: 05/14/2017 11:05   Ct Head Code Stroke Wo Contrast  Result Date: 05/14/2017 CLINICAL DATA:  Code stroke.  Aphasia. EXAM: CT HEAD WITHOUT CONTRAST TECHNIQUE: Contiguous axial images were obtained from the base of the skull through the vertex without intravenous contrast. COMPARISON:  None. FINDINGS: Brain: There is cortical and white matter hypoattenuation consistent with acute infarction involving the left temporal lobe, left parietal lobe, and left insula. No acute intracranial hemorrhage, mass, midline shift, or  extra-axial fluid collection is present. The ventricles and sulci are normal. Periventricular white matter hypoattenuation is nonspecific but may reflect very mild chronic small vessel ischemic disease. Vascular: 4 mm hyperdense focus in the left sylvian fissure consistent with an acute MCA  branch vessel embolus. Questionable small left MCA embolus more proximally near the expected region of the bifurcation. Skull: No fracture or focal osseous lesion. Sinuses/Orbits: Moderate volume fluid in the left sphenoid sinus. Right sphenoid sinus mucous retention cyst. Partially visualized left maxillary sinus mucous retention cyst. Mild mucosal thickening elsewhere in the ethmoid and maxillary sinuses bilaterally. Clear mastoid air cells. Unremarkable orbits. Other: None. ASPECTS ([place] Stroke Program Early CT Score) - Ganglionic level infarction (caudate, lentiform nuclei, internal capsule, insula, M1-M3 cortex): 4 (insula, M2, and M3 involvement) - Supraganglionic infarction (M4-M6 cortex): 2 (M6 involvement) Total score (0-10 with 10 being normal): 6 IMPRESSION: 1. Moderate-sized acute left MCA infarct. Embolus involving a left MCA branch vessel in the sylvian fissure. Possible additional embolus more proximally near the MCA bifurcation. 2. ASPECTS is 6. 3. No acute intracranial hemorrhage. These results were called by telephone at the time of interpretation on 05/14/2017 at 8:48 am to Dr. Conni Slipper , who verbally acknowledged these results. Electronically Signed   By: Logan Bores M.D.   On: 05/14/2017 08:52   Other results:  EKG: Personally reviewed. Normal sinus rhythm at 79 bpm, normal axis, normal intervals, baseline wander in several leads, no significant Q waves, no LVH by voltage, and no ST or T-wave changes. There are no comparisons immediately available.  Assessment & Plan by Problem:  Mr. Lynk is a 56 year old man with a history of Down's syndrome and tobacco abuse who presents with aphasia and  change in his baseline behavior. He was found to have an acute embolic left M2 MCA infarction. Although his speech is improving he still remains dysarthric and difficult to understand. He also has at the very least a persistence in his right facial droop. The source of the probable embolism is unclear at this time, but the workup is proceeding. Patients with Down's syndrome frequently have congenital heart disease and this would be the most likely source.  1) Acute embolic left M2 MCA infarction: We are proceeding with the usual CVA evaluation including an echocardiogram, MRI, and carotid Dopplers. He has been seen by occupational therapy who is recommending a skilled nursing facility, and I suspect that physical therapy may recommend the same thing. We will begin working on addressing his cardiovascular risk factors with oral agents once he is able to safely swallow. A formal swallowing evaluation is pending at this time.  2) Disposition: He will be discharged likely to a skilled nursing facility once we have completed the stroke evaluation, and a therapeutic plan is in place.

## 2017-05-15 NOTE — Evaluation (Signed)
Clinical/Bedside Swallow Evaluation Patient Details  Name: Mario Liu MRN: [id number] Date of Birth: 1961-03-07  Today's Date: 05/15/2017 Time: SLP Start Time (ACUTE ONLY): 1034 SLP Stop Time (ACUTE ONLY): 1104 SLP Time Calculation (min) (ACUTE ONLY): 30 min  Past Medical History:  Past Medical History:  Diagnosis Date  . COPD (chronic obstructive pulmonary disease) ([place])   . Hx of cholecystectomy   . Hypothyroidism   . Kidney cysts   . Major depressive disorder   . Tobacco abuse   . Vitamin D deficiency    Past Surgical History: History reviewed. No pertinent surgical history. HPI:  Mario Liu is a 56yo male with PMH significant for depression, Down's syndrome (currently living in a group home), COPD, and tobacco use who presents with aphasia since this morning at around 7am. History obtained from group home caregivers. Patient was in his usual state of health last night before he went to bed around 10pm. He was woken this morning and got up to get dressed. He came down to the kitchen and his caregiver noted that he was holding his underwear and a shirt in his hands, was not talking, and kept looking to his left side. Head CT at [facility] demonstrated hypodensity in L MCA territory, consistent with infarct. At baseline, patient is able to walk on his own, talk, dress himself, do some chores, but unable to complete activities of daily leaving, including cooking, cleaning, and needs some help with bathing. On interviewing the patient, he endorses that he is hungry, has trouble breathing, and his left shoulder hurts. Denies chest pain, abdominal pain, or headaches. Caregivers state that he did recognize who they were and that his speech is still different than it has been. He does smoke ~1ppd, no alcohol use or other illicit drugs. His sister, Mario Liu, is his legal guardian. She lives in [place] and is on her way to [place].   Assessment / Plan / Recommendation Clinical Impression  Pt  demonstrates oral dysphagia secondary to the left CVA with suspected right buccal/oral sensaory deficits (increased from mild baseline impairment of occasional drooling). No focal lingual weakness noted with oral motor assessment but pt dysarthric with oral residuals on the right with eventual anterior spillage and late coughing. Suspect coughing is secondary to intermittent spillage of pooled secretions and PO residuals falling to pharynx post swallow, but will proceed with objective assessment to determine best diet recommendations. At bedside cues for a second swallow were beneficial. Family in agreement. Pt may have meds in puree and bites of puree/pudding with his sister.  SLP Visit Diagnosis: Dysphagia, oropharyngeal phase (R13.12)    Aspiration Risk  Mild aspiration risk    Diet Recommendation Dysphagia 1 (Puree)   Medication Administration: Whole meds with puree Supervision: Full supervision/cueing for compensatory strategies Compensations: Slow rate;Small sips/bites;Multiple dry swallows after each bite/sip;Monitor for anterior loss;Lingual sweep for clearance of pocketing Postural Changes: Seated upright at 90 degrees;Remain upright for at least 30 minutes after po intake    Other  Recommendations Oral Care Recommendations: Oral care QID Other Recommendations: Have oral suction available   Follow up Recommendations Skilled Nursing facility      Frequency and Duration min 2x/week  2 weeks       Prognosis Prognosis for Safe Diet Advancement: Good Barriers to Reach Goals: Cognitive deficits      Swallow Study   General HPI: Mario Liu is a 56yo male with PMH significant for depression, Down's syndrome (currently living in a group home), COPD, and tobacco  use who presents with aphasia since this morning at around 7am. History obtained from group home caregivers. Patient was in his usual state of health last night before he went to bed around 10pm. He was woken this morning and got  up to get dressed. He came down to the kitchen and his caregiver noted that he was holding his underwear and a shirt in his hands, was not talking, and kept looking to his left side. Head CT at [facility] demonstrated hypodensity in L MCA territory, consistent with infarct. At baseline, patient is able to walk on his own, talk, dress himself, do some chores, but unable to complete activities of daily leaving, including cooking, cleaning, and needs some help with bathing. On interviewing the patient, he endorses that he is hungry, has trouble breathing, and his left shoulder hurts. Denies chest pain, abdominal pain, or headaches. Caregivers state that he did recognize who they were and that his speech is still different than it has been. He does smoke ~1ppd, no alcohol use or other illicit drugs. His sister, Mario Liu, is his legal guardian. She lives in [place] and is on her way to [place]. Type of Study: Bedside Swallow Evaluation Previous Swallow Assessment: none Diet Prior to this Study: NPO Temperature Spikes Noted: No History of Recent Intubation: No Behavior/Cognition: Alert;Cooperative;Requires cueing Oral Cavity Assessment: Excessive secretions Oral Care Completed by SLP: Recent completion by staff Oral Cavity - Dentition: Adequate natural dentition Vision: Functional for self-feeding Self-Feeding Abilities: Needs assist Patient Positioning:  (edge of bed) Baseline Vocal Quality: Normal Volitional Cough: Wet Volitional Swallow: Able to elicit    Oral/Motor/Sensory Function Overall Oral Motor/Sensory Function: Mild impairment Facial ROM: Within Functional Limits Facial Symmetry: Within Functional Limits Facial Strength: Within Functional Limits Facial Sensation: Reduced left Lingual ROM: Within Functional Limits Lingual Symmetry: Within Functional Limits Lingual Strength: Reduced   Ice Chips     Thin Liquid Thin Liquid: Impaired Presentation: Cup;Straw Oral Phase Impairments: Reduced  labial seal;Poor awareness of bolus Oral Phase Functional Implications: Oral residue;Right anterior spillage;Right lateral sulci pocketing Pharyngeal  Phase Impairments: Cough - Delayed    Nectar Thick Nectar Thick Liquid: Not tested   Honey Thick Honey Thick Liquid: Not tested   Puree Puree: Impaired Presentation: Spoon Oral Phase Functional Implications: Right lateral sulci pocketing;Right anterior spillage;Oral residue   Solid   GO   Solid: Impaired Oral Phase Impairments: Reduced lingual movement/coordination Oral Phase Functional Implications: Oral residue;Right lateral sulci pocketing;Right anterior spillage       Herbie Baltimore, MA CCC-SLP [phone]  Cortney Mckinney, Katherene Ponto 05/15/2017,12:13 PM

## 2017-05-16 ENCOUNTER — Inpatient Hospital Stay (HOSPITAL_COMMUNITY): Payer: Medicare Other

## 2017-05-16 DIAGNOSIS — R768 Other specified abnormal immunological findings in serum: Secondary | ICD-10-CM | POA: Insufficient documentation

## 2017-05-16 HISTORY — DX: Other specified abnormal immunological findings in serum: R76.8

## 2017-05-16 LAB — HEPATITIS B CORE ANTIBODY, TOTAL: HEP B C TOTAL AB: NEGATIVE

## 2017-05-16 LAB — HEPATITIS B SURFACE ANTIBODY,QUALITATIVE: HEP B S AB: NONREACTIVE

## 2017-05-16 LAB — HEPATITIS B SURFACE ANTIGEN: HEP B S AG: NEGATIVE

## 2017-05-16 LAB — HEPATITIS C ANTIBODY: HCV Ab: >11 s/co ratio — ABNORMAL HIGH (ref 0.0–0.9)

## 2017-05-16 MED ORDER — BUPROPION HCL ER (XL) 150 MG PO TB24
150.0000 mg | ORAL_TABLET | Freq: Every day | ORAL | Status: DC
  Administered 2017-05-16 – 2017-05-18 (×3): 150 mg via ORAL
  Filled 2017-05-16 (×3): qty 1

## 2017-05-16 MED ORDER — POTASSIUM CHLORIDE CRYS ER 10 MEQ PO TBCR
10.0000 meq | EXTENDED_RELEASE_TABLET | Freq: Every day | ORAL | Status: DC
  Administered 2017-05-16 – 2017-05-18 (×3): 10 meq via ORAL
  Filled 2017-05-16 (×5): qty 1

## 2017-05-16 MED ORDER — RISPERIDONE 0.5 MG PO TABS
3.0000 mg | ORAL_TABLET | Freq: Two times a day (BID) | ORAL | Status: DC
  Administered 2017-05-16 – 2017-05-18 (×4): 3 mg via ORAL
  Filled 2017-05-16 (×4): qty 6

## 2017-05-16 MED ORDER — CITALOPRAM HYDROBROMIDE 40 MG PO TABS
40.0000 mg | ORAL_TABLET | Freq: Every day | ORAL | Status: DC
  Administered 2017-05-16 – 2017-05-18 (×3): 40 mg via ORAL
  Filled 2017-05-16 (×3): qty 1

## 2017-05-16 MED ORDER — CLONAZEPAM 0.5 MG PO TABS
0.5000 mg | ORAL_TABLET | Freq: Every day | ORAL | Status: DC
  Administered 2017-05-16 – 2017-05-18 (×3): 0.5 mg via ORAL
  Filled 2017-05-16 (×3): qty 1

## 2017-05-16 NOTE — Progress Notes (Signed)
Subjective:  Patient's sister and legal guardian, Mario Liu, is at bedside. Patient sitting up at bedside when seen this morning. He is wondering if he can go home today. His speech continues to be dysarthric and he has difficulty swallowing solid foods. He is on dysphagia 2 diet per speech evaluation. PT has recommended CIR (for speech/OT therapy) vs 24 hour supervision.  Objective:  Vital signs in last 24 hours: Vitals:   05/15/17 2137 05/16/17 0032 05/16/17 0506 05/16/17 0954  BP: 100/62 115/75 127/85 114/78  Pulse: 77 65 68 66  Resp: 18 18 18 18   Temp: 98.2 F (36.8 C) 98.5 F (36.9 C) 97.8 F (36.6 C) 98.4 F (36.9 C)  TempSrc: Oral Oral Oral Oral  SpO2: 95% 97% 99% 97%  Weight:      Height:       GEN: AA male sitting up on the side of the bed; no acute distress CV: Normal rate and regular rhythm. No murmurs, gallops, or rubs. ABD: Soft. Non-tender. Non-distended. Normoactive bowel sounds. EXT: No edema. NEURO: R sided facial droop; strength 5/5 in bilateral UE and LE. Dysarthric speech with poor control of oral secretions.  Labs CBC Latest Ref Rng & Units 05/14/2017 04/20/2017  WBC 3.8 - 10.6 K/uL 5.3 4.7  Hemoglobin 13.0 - 18.0 g/dL 13.6 12.6(L)  Hematocrit 40.0 - 52.0 % 40.7 37.1(L)  Platelets 150 - 440 K/uL 123(L) 128(L)   BMP Latest Ref Rng & Units 05/15/2017 05/14/2017 04/20/2017  Glucose 65 - 99 mg/dL 100(H) 120(H) 114(H)  BUN 6 - 20 mg/dL 10 17 18   Creatinine 0.61 - 1.24 mg/dL 1.19 1.64(H) 1.19  Sodium 135 - 145 mmol/L 136 134(L) 135  Potassium 3.5 - 5.1 mmol/L 4.1 5.0 3.6  Chloride 101 - 111 mmol/L 108 102 100(L)  CO2 22 - 32 mmol/L 23 26 28   Calcium 8.9 - 10.3 mg/dL 8.6(L) 9.3 9.2    Assessment/Plan:  Active Problems:   CVA (cerebral vascular accident) ([place])   Acute ischemic stroke ([place])   Down's syndrome   PFO with atrial septal aneurysm   Hepatitis C antibody test positive  Mario Liu is a 56yo male with PMH significant for depression, Down's syndrome  (currently living in a group home), COPD, and tobacco use who presents with acute onset of aphasia, R facial droop, decreased facial sensation on right, and left gaze preference. CT imaging demonstrates left M2 branch occlusion with L MCA infarct. Continues to have R facial droop and dysarthria.  # L MCA infarct Etiology likely embolic. With Down syndrome, has increased risk of ASD/VSD. Still has R facial droop and dysarthria. History is difficult to obtain from patient, but he denies pain or trouble breathing. Small PFO/ASD seen on bubble study. Neurology do not think he is a candidate for closure or ILR. MRI Brain this morning showed an increase in size of previous infarct to include the ischemic penumbra as well as changes suggesting a transient ischemia in the L anterior MCA distribution with subsequent reperfusion hyperemia. Discussed with neurology, these are expected changes and no changes in current management recommended. - Started on Atorvastatin 40 mg qd - A1c 5.9 - Aspirin 325 mg & Plavix 75 mg daily for 3 months then Plavix alone per neuro - Continue PT/OT, speech  # Positive Hepatitis C Antibody: Had previous positive HCV antibody. Was seen by GI outpatient and HCV RNA 02/06/17 was not detected indicating prior viral exposure that self-cleared. No further workup required.  # COPD Patient without shortness  of breath or increased oxygen requirement. - continue to monitor O2 sats  Diet: Dysphagia 2 VTE PPx: Lovenox Code status: Full - confirmed with legal guardian - Mario Liu (sister)  Dispo: Anticipated discharge to CIR vs home with 24 hour supervision and therapy in 1-3 days.   Zada Finders, MD  Internal Medicine, PGY-3 05/16/2017, 10:37 AM

## 2017-05-16 NOTE — Discharge Summary (Signed)
Name: Mario Liu MRN: [id number] DOB: 06/24/1961 56 y.o. PCP: [name], FNP  Date of Admission: 05/14/2017 10:23 AM Date of Discharge: 05/18/2017 Attending Physician: Oval Linsey, MD  Discharge Diagnosis: Principal Problem:   Acute ischemic left MCA stroke [facility]) Active Problems:   CVA (cerebral vascular accident) ([place])   Down's syndrome   PFO with atrial septal aneurysm  Discharge Medications: Allergies as of 05/18/2017   No Known Allergies     Medication List    STOP taking these medications   sulfamethoxazole-trimethoprim 800-160 MG tablet Commonly known as:  BACTRIM DS,SEPTRA DS     TAKE these medications   albuterol 108 (90 Base) MCG/ACT inhaler Commonly known as:  PROVENTIL HFA;VENTOLIN HFA Inhale 2 puffs into the lungs every 6 (six) hours as needed for wheezing or shortness of breath.   alendronate 70 MG tablet Commonly known as:  FOSAMAX Take 70 mg by mouth once a week. Fridays   aspirin 325 MG tablet Take 1 tablet (325 mg total) by mouth daily.   atorvastatin 40 MG tablet Commonly known as:  LIPITOR Take 1 tablet (40 mg total) by mouth daily at 6 PM.   buPROPion 150 MG 24 hr tablet Commonly known as:  WELLBUTRIN XL Take 150 mg by mouth daily.   calcium carbonate 600 MG Tabs tablet Commonly known as:  OS-CAL Take 600 mg by mouth 2 (two) times daily with a meal.   cetirizine 10 MG tablet Commonly known as:  ZYRTEC Take 10 mg by mouth daily.   citalopram 40 MG tablet Commonly known as:  CELEXA Take 40 mg by mouth daily.   clonazePAM 0.5 MG tablet Commonly known as:  KLONOPIN Take 0.5 mg by mouth daily. *may also take an additional 0.5mg  tab as needed for anxiety/agitation   clopidogrel 75 MG tablet Commonly known as:  PLAVIX Take 1 tablet (75 mg total) by mouth daily.   fluticasone 50 MCG/ACT nasal spray Commonly known as:  FLONASE Place 2 sprays into the nose daily as needed for allergies.   furosemide 40 MG tablet Commonly  known as:  LASIX Take 40 mg by mouth daily.   meloxicam 15 MG tablet Commonly known as:  MOBIC Take 15 mg by mouth daily.   potassium chloride 10 MEQ tablet Commonly known as:  K-DUR Take 10 mEq by mouth daily.   risperiDONE 3 MG tablet Commonly known as:  RISPERDAL Take 3 mg by mouth 2 (two) times daily.   traZODone 50 MG tablet Commonly known as:  DESYREL Take 50 mg by mouth at bedtime.   Vitamin D3 50000 units Caps Take 1 Can by mouth every 7 (seven) days. Thursdays            Discharge Care Instructions        Start     Ordered   05/18/17 0000  clopidogrel (PLAVIX) 75 MG tablet  Daily     05/17/17 1051   05/18/17 0000  aspirin 325 MG tablet  Daily     05/18/17 1216   05/18/17 0000  Increase activity slowly     05/18/17 1216   05/18/17 0000  Diet - low sodium heart healthy     05/18/17 1216   05/17/17 0000  atorvastatin (LIPITOR) 40 MG tablet  Daily-1800     05/17/17 1051   05/15/17 0000  Ambulatory referral to Neurology    Comments:  Pt will follow up with stroke MD Erlinda Hong preferred, if not available, then consider Arnoldo Morale or  Ahern) at [facility] in about 2 months. Thanks.   05/15/17 2310     Disposition and follow-up:   Mario Liu was discharged from [facility] in Stable condition.  At the hospital follow up visit please address:  1.   Left MCA Stroke:  Suspected embolic source although unclear definitive etiology. Neurology recommended against further evaluation with TEE. Recommendations were for DAPT with Aspirin 325 mg daily plus Plavix 75 mg daily for 3 months then Plavix alone afterwards. Neurology also recommended a 30 day cardiac event monitor as outpatient to rule out Afib.  PFO/ASD: Small PFO or ASD seen on TTE with bubble study. Neurology did not think patient was a good candidate for PFO closure and advised that PFO related stroke has a low recurrence overall.  2.  Labs / imaging needed at time of follow-up: none  3.   Pending labs/ test needing follow-up: none  Follow-up Appointments: Follow-up Information    Rosalin Hawking, MD. Schedule an appointment as soon as possible for a visit in 6 week(s).   Specialty:  Neurology Contact information: [address] [place] [address] [phone]        [name], FNP Follow up.   Specialty:  Family Medicine Contact information: [place] [address] [phone]          Hospital Course by problem list: Principal Problem:   Acute ischemic left MCA stroke [facility]) Active Problems:   Down's syndrome   PFO with atrial septal aneurysm   Left MCA Stroke: Patient presented with acute onset of dysarthria, dysphagia, R facial droop, decreased facial sensation on right, and left gaze preference. CT imaging demonstrated left M2 branch occlusion with downstream L MCA infarct. Etiology not definitively clear, however embolic source was suspected. MRI Brain on 9/1 showed an increase in size of previous infarct to include the ischemic penumbra as well as changes suggesting a transient ischemia in the L anterior MCA distribution with subsequent reperfusion hyperemia which were expected changes per neurology. A small PFO/ASD was seen on bubble study. Neurology did not think patient was a candidate for PFO closure, implantable loop recorder, and recommended against further evaluation with TEE. Risk reduction was initiated with high-intensity statin (Atorvastatin 40 mg daily) and DAPT ( Aspirin 325 mg daily plus Plavix 75 mg daily for 3 months then Plavix alone afterwards). Patient had continued dysarthric speech and dysphagia with slight improvement from admission. He will now be transferred to inpatient rehab for continued therapy.   Discharge Vitals:   BP 106/70 (BP Location: Left Arm)   Pulse 79   Temp 98.7 F (37.1 C) (Oral)   Resp 18   Ht 5\' 8"  (1.727 m)   Wt 220 lb 7.4 oz (100 kg)   SpO2 98%   BMI 33.52 kg/m    Pertinent Labs, Studies, and Procedures:   CT HEAD WO CONTRAST 05/14/17: 1. Moderate-sized acute left MCA infarct. Embolus involving a left MCA branch vessel in the sylvian fissure. Possible additional embolus more proximally near the MCA bifurcation. 2. ASPECTS is 6. 3. No acute intracranial hemorrhage.  CT CEREBRAL PERFUSION W CONTRAST, CTA HEAD & NECK W or WO CONTRAST 05/14/17: 1. Left M2 branch occlusion with downstream 14 cc of left temporoparietal infarct and 38 cc of ischemia by cerebral perfusion. No progression of aspects compared to CT earlier today. No acute hemorrhage. 2. No stenosis, atherosclerosis, or embolic source identified in the neck. 3. Minimal if any atheromatous changes in  the intracranial circulation.  MR BRAIN WO CONTRAST 05/16/17: 1. Left posterior MCA distribution acute core infarction has increased in size from prior CT perfusion to include the ischemic penumbra. 2. Mild reduced cortical diffusion throughout the left anterior MCA distribution correspond to a region of mildly increased rCBF on prior CT perfusion suggest that the anterior left MCA distribution underwent transient ischemia resulting in cortical infarction with subsequent reperfusion hyperemia either by recanalization or collateralization. Differential considerations for cortical reduced diffusion in the setting of stroke would include secondary seizure activity. 3. No acute hemorrhage or significant mass effect.  ECHO COMPLETE BUBBLE STUDY 05/15/17: - Left ventricle: The cavity size was normal. Wall thickness was   normal. Systolic function was normal. The estimated ejection   fraction was in the range of 55% to 60%. - Mitral valve: There was mild regurgitation. - Left atrium: The atrium was mildly dilated. - Atrial septum: With injection of agitated saline with cough there   were bubbles seen in left sided chambers consistent with small   PFO/ASD. Interatrial septum is  hypermobile.  VAS [place] LE VENOUS DOPPLERS 05/15/17: - No evidence of deep vein thrombosis involving the right lower   extremity and left lower extremity. - Incidental findings are consistent with: Baker&'s Cyst on the left. - No evidence of Baker&'s cyst on the right.  Discharge Instructions: Discharge Instructions    Ambulatory referral to Neurology    Complete by:  As directed    Pt will follow up with stroke MD Erlinda Hong preferred, if not available, then consider Leonie Man, Penumalli or Ahern) at [facility] in about 2 months. Thanks.   Diet - low sodium heart healthy    Complete by:  As directed    Increase activity slowly    Complete by:  As directed      Signed: Ina Homes, MD 05/18/2017, 12:19 PM

## 2017-05-16 NOTE — Progress Notes (Signed)
Speech Language Pathology Treatment: Dysphagia  Patient Details Name: Barre Aydelott MRN: [id number] DOB: October 30, 1960 Today's Date: 05/16/2017 Time: 1010-1029 SLP Time Calculation (min) (ACUTE ONLY): 19 min  Assessment / Plan / Recommendation Clinical Impression  Pt seen for dysphagia treatment for follow-up in caregiver education/training in compensatory strategies and aspiration precautions. Spoke with RN, CNA re: pt's liquid recommendations for water only, full supervision due to level of cuing needed due to aspiration risk. Assisted pt with oral care, however pt observed to swallow vs expectorate despite max cues; SLP informed RN who set up oral suction; SLP provided suctioning and oral care. Recommended to pt's sister, RN use of oral suction only for oral care as pt is at risk for aspirating toothpaste and oral secretions. Pt's sister reports pt has not been eating well, states he "couldn't chew" dys 2 solids. SLP provided trials for diagnostic treatment and training in compensatory swallow strategies. Pt initially following cues for double swallow in 25% of trials. SLP turned television off vs muting to reduce distractions and pt completed double swallow 80% of the time. Will downgrade to dys 1, continue thin liquids (WATER ONLY) and meds whole in puree. Pt needs full supervision during meals and constant cues for double swallow to reduce/prevent aspiration. Recommend oral care (suction toothbrush) prior to and following all meals. Will follow closely.     HPI HPI: Mr. Eimers is a 56yo male with PMH significant for depression, Down's syndrome (currently living in a group home), COPD, and tobacco use who presents with aphasia since this morning at around 7am. History obtained from group home caregivers. Patient was in his usual state of health last night before he went to bed around 10pm. He was woken this morning and got up to get dressed. He came down to the kitchen and his caregiver noted that he was  holding his underwear and a shirt in his hands, was not talking, and kept looking to his left side. Head [facility] at [facility] demonstrated hypodensity in L MCA territory, consistent with infarct. At baseline, patient is able to walk on his own, talk, dress himself, do some chores, but unable to complete activities of daily leaving, including cooking, cleaning, and needs some help with bathing. On interviewing the patient, he endorses that he is hungry, has trouble breathing, and his left shoulder hurts. Denies chest pain, abdominal pain, or headaches. Caregivers state that he did recognize who they were and that his speech is still different than it has been. He does smoke ~1ppd, no alcohol use or other illicit drugs. His sister, [name], is his legal guardian. She lives in [place] and is on her way to [place].      SLP Plan  Continue with current plan of care       Recommendations  Diet recommendations: Dysphagia 1 (puree);Thin liquid (WATER only ) Liquids provided via: Cup;Straw Medication Administration: Whole meds with puree Supervision: Full supervision/cueing for compensatory strategies Compensations: Slow rate;Small sips/bites;Multiple dry swallows after each bite/sip;Minimize environmental distractions                Oral Care Recommendations: Oral care QID Follow up Recommendations: Other (comment) (Per MD notes, CIR for OT/ST. Rec HHSLP if not CIR) SLP Visit Diagnosis: Dysphagia, oropharyngeal phase (R13.12) Plan: Continue with current plan of care       [place], [place], [place] Speech-Language Pathologist [phone]  Aliene Altes 05/16/2017, 11:16  AM    

## 2017-05-17 DIAGNOSIS — J449 Chronic obstructive pulmonary disease, unspecified: Secondary | ICD-10-CM

## 2017-05-17 DIAGNOSIS — R471 Dysarthria and anarthria: Secondary | ICD-10-CM

## 2017-05-17 DIAGNOSIS — R29810 Facial weakness: Secondary | ICD-10-CM

## 2017-05-17 DIAGNOSIS — R768 Other specified abnormal immunological findings in serum: Secondary | ICD-10-CM

## 2017-05-17 DIAGNOSIS — F17200 Nicotine dependence, unspecified, uncomplicated: Secondary | ICD-10-CM

## 2017-05-17 LAB — GLUCOSE, CAPILLARY: GLUCOSE-CAPILLARY: 96 mg/dL (ref 65–99)

## 2017-05-17 MED ORDER — ATORVASTATIN CALCIUM 40 MG PO TABS: 40.0000 mg | ORAL_TABLET | Freq: Every day | ORAL | 0 refills | Status: DC

## 2017-05-17 MED ORDER — CLOPIDOGREL BISULFATE 75 MG PO TABS: 75.0000 mg | ORAL_TABLET | Freq: Every day | ORAL | 0 refills | Status: DC

## 2017-05-17 MED ORDER — ASPIRIN 325 MG PO TABS: 325.0000 mg | ORAL_TABLET | Freq: Every day | ORAL | 0 refills | Status: DC

## 2017-05-17 NOTE — Plan of Care (Signed)
Problem: Pain Managment: Goal: General experience of comfort will improve Outcome: Progressing Able to answer Y/N as to any pain, difficulty doing range #, may need to incorporate "Faces".

## 2017-05-17 NOTE — Progress Notes (Signed)
  Speech Language Pathology Treatment: Dysphagia  Patient Details Name: Mario Liu MRN: [id number] DOB: [birth year] Today's Date: 05/17/2017 Time: [id number] SLP Time Calculation (min) (ACUTE ONLY): 30 min  Assessment / Plan / Recommendation Clinical Impression  Patient seen for dysphagia treatment for ongoing assessment of pt's diet tolerance and training/education in compensatory strategies, swallowing precautions. Pt upright on edge of bed, nursing student present feeding pt from lunch tray of dys 1/ thin liquids. Noted iced tea on tray though pt is supposed to have water only; removed tea and clarified dietary restrictions with RN. Pt observed with wet vocal quality, oral residue. SLP reduced environmental distractions, demonstrated cuing for clearing swallows, monitoring for wet vocal quality and oral residue. With thin liquids, pt requires mod cues/assistance to reduce amount/rate of intake, consistent mod A for clearing swallows. Presented with cough x2 immediately after initial swallow of larger boluses of thin liquids, suggestive of reduced airway protection. With SLP assistance, cues for smaller sips, overt signs of aspiration reduce though pt continues to demonstrate anterior oral spillage and requires cues for lip closure, dry swallow. Performed oral care at completion of meal; updated RN on pt's tolerance, swallow strategies, recommendations for dys 1 with WATER ONLY with full supervision and cuing for multiple swallows, slowed rate of intake, oral care with suction toothbrush before and after PO.     HPI HPI: Mario Liu is a 56yo male with PMH significant for depression, Down's syndrome (currently living in a group home), COPD, and tobacco use who presents with aphasia since this morning at around 7am. History obtained from group home caregivers. Patient was in his usual state of health last night before he went to bed around 10pm. He was woken this morning and got up to get dressed. He came  down to the kitchen and his caregiver noted that he was holding his underwear and a shirt in his hands, was not talking, and kept looking to his left side. Head CT at [facility] demonstrated hypodensity in L MCA territory, consistent with infarct. At baseline, patient is able to walk on his own, talk, dress himself, do some chores, but unable to complete activities of daily leaving, including cooking, cleaning, and needs some help with bathing. On interviewing the patient, he endorses that he is hungry, has trouble breathing, and his left shoulder hurts. Denies chest pain, abdominal pain, or headaches. Caregivers state that he did recognize who they were and that his speech is still different than it has been. He does smoke ~1ppd, no alcohol use or other illicit drugs. His sister, [name], is his legal guardian. She lives in [place] and is on her way to [place].      SLP Plan  Continue with current plan of care       Recommendations  Diet recommendations: Dysphagia 1 (puree);Thin liquid;Other(comment) (WATER ONLY) Liquids provided via: Cup;Straw Medication Administration: Whole meds with puree Supervision: Full supervision/cueing for compensatory strategies Compensations: Slow rate;Small sips/bites;Multiple dry swallows after each bite/sip;Minimize environmental distractions                Oral Care Recommendations: Oral care before and after PO Follow up Recommendations: Other (comment) (CIR or SNF) SLP Visit Diagnosis: Dysphagia, oropharyngeal phase (R13.12) Plan: Continue with current plan of care       [place], [place], [place] Speech-Language Pathologist [phone]  Aliene Altes 05/17/2017, 1:12 PM

## 2017-05-17 NOTE — Progress Notes (Signed)
Subjective:  Patient is resting in bed this morning when seen on rounds. He has no complaints other than wondering when he can go home and asking for water.   Objective:  Vital signs in last 24 hours: Vitals:   05/17/17 0024 05/17/17 0615 05/17/17 0626 05/17/17 0918  BP: 117/82 (!) 77/54 100/72 (!) 85/61  Pulse: 68 86  84  Resp: 18 18  18   Temp: 98 F (36.7 C) 99.1 F (37.3 C)  97.9 F (36.6 C)  TempSrc: Oral Oral  Oral  SpO2: 100% 96%  97%  Weight:      Height:       General: resting in bed, no acute distress Cardiac: RRR, no rubs, murmurs or gallops Pulm: clear to auscultation bilaterally, moving normal volumes of air Abd: soft, nontender, nondistended Ext: warm and well perfused, no pedal edema Neuro: R sided facial droop; strength 5/5 in bilateral UE and LE although cooperation is difficult. Dysarthric speech.   Labs CBC Latest Ref Rng & Units 05/14/2017 04/20/2017  WBC 3.8 - 10.6 K/uL 5.3 4.7  Hemoglobin 13.0 - 18.0 g/dL 13.6 12.6(L)  Hematocrit 40.0 - 52.0 % 40.7 37.1(L)  Platelets 150 - 440 K/uL 123(L) 128(L)   BMP Latest Ref Rng & Units 05/15/2017 05/14/2017 04/20/2017  Glucose 65 - 99 mg/dL 100(H) 120(H) 114(H)  BUN 6 - 20 mg/dL 10 17 18   Creatinine 0.61 - 1.24 mg/dL 1.19 1.64(H) 1.19  Sodium 135 - 145 mmol/L 136 134(L) 135  Potassium 3.5 - 5.1 mmol/L 4.1 5.0 3.6  Chloride 101 - 111 mmol/L 108 102 100(L)  CO2 22 - 32 mmol/L 23 26 28   Calcium 8.9 - 10.3 mg/dL 8.6(L) 9.3 9.2    Assessment/Plan:  Active Problems:   CVA (cerebral vascular accident) ([place])   Acute ischemic stroke ([place])   Down's syndrome   PFO with atrial septal aneurysm   Hepatitis C antibody test positive  Mario Liu is a 56yo male with PMH significant for depression, Down's syndrome (currently living in a group home), COPD, and tobacco use who presents with acute onset of aphasia, R facial droop, decreased facial sensation on right, and left gaze preference. CT imaging demonstrates left M2  branch occlusion with L MCA infarct. Continues to have R facial droop and dysarthria.  # L MCA infarct Etiology likely embolic. With Down syndrome, has increased risk of ASD/VSD. Still has R facial droop and dysarthria. History is difficult to obtain from patient, but he denies pain or trouble breathing. Small PFO/ASD seen on bubble study. Neurology do not think he is a candidate for closure or ILR and have advised against further evaluation with TEE. MRI Brain on 9/1 showed an increase in size of previous infarct to include the ischemic penumbra as well as changes suggesting a transient ischemia in the L anterior MCA distribution with subsequent reperfusion hyperemia. Discussed with neurology, these are expected changes and no changes in current management recommended. - Atorvastatin 40 mg qd - A1c 5.9 - Aspirin 325 mg & Plavix 75 mg daily for 3 months then Plavix alone per neuro - Continue PT/OT, speech - Awaiting CIR placement vs home with SLP/OT  # Positive Hepatitis C Antibody: Had previous positive HCV antibody. Was seen by GI outpatient and HCV RNA 02/06/17 was not detected indicating prior viral exposure that self-cleared. No further workup required.  # COPD Patient without shortness of breath or increased oxygen requirement. - continue to monitor O2 sats  Diet: Dysphagia 1 VTE PPx: Lovenox  Code status: Full - confirmed with legal guardian - [name] (sister)  Dispo: Anticipated discharge to CIR vs home with 24 hour supervision and therapy in 1-2 days.   Zada Finders, MD  Internal Medicine, PGY-3 05/17/2017, 10:38 AM

## 2017-05-17 NOTE — Progress Notes (Signed)
  Date: 05/17/2017  Patient name: Mario Liu  Medical record number: [id number]  Date of birth: [birth year]   I have seen and evaluated this patient and I have discussed the plan of care with the house staff. Please see their note for complete details. I concur with their findings with the following additions/corrections: Waiting on dispo - CIR vs SNF.  Bartholomew Crews, MD 05/17/2017, 11:38 AM

## 2017-05-18 ENCOUNTER — Encounter (HOSPITAL_COMMUNITY): Payer: Self-pay | Admitting: Physical Medicine and Rehabilitation

## 2017-05-18 ENCOUNTER — Inpatient Hospital Stay (HOSPITAL_COMMUNITY)
Admission: EM | Admit: 2017-05-18 | Discharge: 2017-05-26 | DRG: 057 | Disposition: A | Discharge status: Home / Self Care | Payer: Medicare Other | Source: Intra-hospital | Attending: Physical Medicine & Rehabilitation | Admitting: Physical Medicine & Rehabilitation

## 2017-05-18 DIAGNOSIS — Q2112 Patent foramen ovale: Secondary | ICD-10-CM

## 2017-05-18 DIAGNOSIS — I63512 Cerebral infarction due to unspecified occlusion or stenosis of left middle cerebral artery: Secondary | ICD-10-CM

## 2017-05-18 DIAGNOSIS — E559 Vitamin D deficiency, unspecified: Secondary | ICD-10-CM | POA: Diagnosis present

## 2017-05-18 DIAGNOSIS — Z7902 Long term (current) use of antithrombotics/antiplatelets: Secondary | ICD-10-CM | POA: Diagnosis not present

## 2017-05-18 DIAGNOSIS — R1310 Dysphagia, unspecified: Secondary | ICD-10-CM | POA: Diagnosis present

## 2017-05-18 DIAGNOSIS — Z791 Long term (current) use of non-steroidal anti-inflammatories (NSAID): Secondary | ICD-10-CM | POA: Diagnosis not present

## 2017-05-18 DIAGNOSIS — Z87891 Personal history of nicotine dependence: Secondary | ICD-10-CM

## 2017-05-18 DIAGNOSIS — Q909 Down syndrome, unspecified: Secondary | ICD-10-CM

## 2017-05-18 DIAGNOSIS — F329 Major depressive disorder, single episode, unspecified: Secondary | ICD-10-CM | POA: Diagnosis present

## 2017-05-18 DIAGNOSIS — Z7982 Long term (current) use of aspirin: Secondary | ICD-10-CM | POA: Diagnosis not present

## 2017-05-18 DIAGNOSIS — Z79899 Other long term (current) drug therapy: Secondary | ICD-10-CM | POA: Diagnosis not present

## 2017-05-18 DIAGNOSIS — K5900 Constipation, unspecified: Secondary | ICD-10-CM | POA: Diagnosis present

## 2017-05-18 DIAGNOSIS — R1311 Dysphagia, oral phase: Secondary | ICD-10-CM | POA: Diagnosis present

## 2017-05-18 DIAGNOSIS — B182 Chronic viral hepatitis C: Secondary | ICD-10-CM | POA: Diagnosis present

## 2017-05-18 DIAGNOSIS — I69320 Aphasia following cerebral infarction: Principal | ICD-10-CM

## 2017-05-18 DIAGNOSIS — F3341 Major depressive disorder, recurrent, in partial remission: Secondary | ICD-10-CM | POA: Diagnosis not present

## 2017-05-18 DIAGNOSIS — R7989 Other specified abnormal findings of blood chemistry: Secondary | ICD-10-CM | POA: Diagnosis not present

## 2017-05-18 DIAGNOSIS — Q211 Atrial septal defect: Secondary | ICD-10-CM

## 2017-05-18 DIAGNOSIS — Z7409 Other reduced mobility: Secondary | ICD-10-CM | POA: Diagnosis present

## 2017-05-18 DIAGNOSIS — J449 Chronic obstructive pulmonary disease, unspecified: Secondary | ICD-10-CM | POA: Diagnosis present

## 2017-05-18 DIAGNOSIS — Z7983 Long term (current) use of bisphosphonates: Secondary | ICD-10-CM | POA: Diagnosis not present

## 2017-05-18 DIAGNOSIS — E039 Hypothyroidism, unspecified: Secondary | ICD-10-CM | POA: Diagnosis present

## 2017-05-18 DIAGNOSIS — D696 Thrombocytopenia, unspecified: Secondary | ICD-10-CM | POA: Diagnosis not present

## 2017-05-18 HISTORY — DX: Unspecified viral hepatitis C without hepatic coma: B19.20

## 2017-05-18 MED ORDER — BISACODYL 10 MG RE SUPP
10.0000 mg | Freq: Every day | RECTAL | Status: DC | PRN
Start: 2017-05-18 — End: 2017-05-26
  Administered 2017-05-19 – 2017-05-24 (×2): 10 mg via RECTAL
  Filled 2017-05-18 (×2): qty 1

## 2017-05-18 MED ORDER — SENNOSIDES-DOCUSATE SODIUM 8.6-50 MG PO TABS
2.0000 | ORAL_TABLET | Freq: Two times a day (BID) | ORAL | 0 refills | Status: DC
Start: 2017-05-18 — End: 2017-05-26

## 2017-05-18 MED ORDER — BUPROPION HCL ER (XL) 150 MG PO TB24
150.0000 mg | ORAL_TABLET | Freq: Every day | ORAL | Status: DC
  Administered 2017-05-19 – 2017-05-26 (×8): 150 mg via ORAL
  Filled 2017-05-18 (×8): qty 1

## 2017-05-18 MED ORDER — PROCHLORPERAZINE MALEATE 5 MG PO TABS: 5.0000 mg | ORAL_TABLET | Freq: Four times a day (QID) | ORAL | Status: DC | PRN

## 2017-05-18 MED ORDER — POLYETHYLENE GLYCOL 3350 17 G PO PACK
17.0000 g | PACK | Freq: Two times a day (BID) | ORAL | Status: DC
  Administered 2017-05-18: 17 g via ORAL
  Filled 2017-05-18: qty 1

## 2017-05-18 MED ORDER — CITALOPRAM HYDROBROMIDE 20 MG PO TABS
40.0000 mg | ORAL_TABLET | Freq: Every day | ORAL | Status: DC
  Administered 2017-05-19 – 2017-05-26 (×8): 40 mg via ORAL
  Filled 2017-05-18 (×8): qty 2

## 2017-05-18 MED ORDER — TRAZODONE HCL 50 MG PO TABS: 25.0000 mg | ORAL_TABLET | Freq: Every evening | ORAL | Status: DC | PRN

## 2017-05-18 MED ORDER — ASPIRIN 325 MG PO TABS: 325.0000 mg | ORAL_TABLET | Freq: Every day | ORAL | 0 refills | Status: DC

## 2017-05-18 MED ORDER — CLOPIDOGREL BISULFATE 75 MG PO TABS
75.0000 mg | ORAL_TABLET | Freq: Every day | ORAL | Status: DC
  Administered 2017-05-19 – 2017-05-26 (×8): 75 mg via ORAL
  Filled 2017-05-18 (×8): qty 1

## 2017-05-18 MED ORDER — FLEET ENEMA 7-19 GM/118ML RE ENEM: 1.0000 | ENEMA | Freq: Once | RECTAL | Status: DC | PRN

## 2017-05-18 MED ORDER — LORATADINE 10 MG PO TABS
10.0000 mg | ORAL_TABLET | Freq: Every day | ORAL | Status: DC
  Administered 2017-05-19 – 2017-05-26 (×8): 10 mg via ORAL
  Filled 2017-05-18 (×8): qty 1

## 2017-05-18 MED ORDER — SENNOSIDES-DOCUSATE SODIUM 8.6-50 MG PO TABS: 2.0000 | ORAL_TABLET | Freq: Two times a day (BID) | ORAL | Status: DC

## 2017-05-18 MED ORDER — PROCHLORPERAZINE 25 MG RE SUPP: 12.5000 mg | Freq: Four times a day (QID) | RECTAL | Status: DC | PRN

## 2017-05-18 MED ORDER — RISPERIDONE 1 MG PO TABS
3.0000 mg | ORAL_TABLET | Freq: Two times a day (BID) | ORAL | Status: DC
  Administered 2017-05-18 – 2017-05-26 (×16): 3 mg via ORAL
  Filled 2017-05-18 (×16): qty 3

## 2017-05-18 MED ORDER — ENOXAPARIN SODIUM 40 MG/0.4ML ~~LOC~~ SOLN
40.0000 mg | SUBCUTANEOUS | Status: DC
  Administered 2017-05-18 – 2017-05-25 (×8): 40 mg via SUBCUTANEOUS
  Filled 2017-05-18 (×9): qty 0.4

## 2017-05-18 MED ORDER — CLONAZEPAM 0.5 MG PO TABS
0.5000 mg | ORAL_TABLET | Freq: Every day | ORAL | Status: DC
  Administered 2017-05-19 – 2017-05-26 (×8): 0.5 mg via ORAL
  Filled 2017-05-18 (×8): qty 1

## 2017-05-18 MED ORDER — ATORVASTATIN CALCIUM 40 MG PO TABS
40.0000 mg | ORAL_TABLET | Freq: Every day | ORAL | Status: DC
  Administered 2017-05-18 – 2017-05-25 (×8): 40 mg via ORAL
  Filled 2017-05-18 (×8): qty 1

## 2017-05-18 MED ORDER — POTASSIUM CHLORIDE CRYS ER 10 MEQ PO TBCR
10.0000 meq | EXTENDED_RELEASE_TABLET | Freq: Every day | ORAL | Status: DC
  Administered 2017-05-19 – 2017-05-26 (×8): 10 meq via ORAL
  Filled 2017-05-18 (×8): qty 1

## 2017-05-18 MED ORDER — POLYETHYLENE GLYCOL 3350 17 G PO PACK
17.0000 g | PACK | Freq: Every day | ORAL | Status: DC | PRN
  Administered 2017-05-21: 17 g via ORAL
  Filled 2017-05-18: qty 1

## 2017-05-18 MED ORDER — ALUM & MAG HYDROXIDE-SIMETH 200-200-20 MG/5ML PO SUSP: 30.0000 mL | ORAL | Status: DC | PRN

## 2017-05-18 MED ORDER — GUAIFENESIN-DM 100-10 MG/5ML PO SYRP: 5.0000 mL | ORAL_SOLUTION | Freq: Four times a day (QID) | ORAL | Status: DC | PRN

## 2017-05-18 MED ORDER — POLYETHYLENE GLYCOL 3350 17 G PO PACK: 17.0000 g | PACK | Freq: Two times a day (BID) | ORAL | 0 refills | Status: DC

## 2017-05-18 MED ORDER — MAGIC MOUTHWASH
5.0000 mL | Freq: Three times a day (TID) | ORAL | Status: DC
  Administered 2017-05-18 – 2017-05-26 (×17): 5 mL via ORAL
  Filled 2017-05-18 (×19): qty 5

## 2017-05-18 MED ORDER — ACETAMINOPHEN 325 MG PO TABS
325.0000 mg | ORAL_TABLET | ORAL | Status: DC | PRN
  Administered 2017-05-24: 650 mg via ORAL
  Filled 2017-05-18 (×2): qty 2

## 2017-05-18 MED ORDER — ASPIRIN EC 325 MG PO TBEC
325.0000 mg | DELAYED_RELEASE_TABLET | Freq: Every day | ORAL | Status: DC
  Administered 2017-05-19 – 2017-05-26 (×8): 325 mg via ORAL
  Filled 2017-05-18 (×8): qty 1

## 2017-05-18 MED ORDER — FLUTICASONE PROPIONATE 50 MCG/ACT NA SUSP
2.0000 | Freq: Every day | NASAL | Status: DC | PRN
  Filled 2017-05-18: qty 16

## 2017-05-18 MED ORDER — DIPHENHYDRAMINE HCL 12.5 MG/5ML PO ELIX: 12.5000 mg | ORAL_SOLUTION | Freq: Four times a day (QID) | ORAL | Status: DC | PRN

## 2017-05-18 MED ORDER — PROCHLORPERAZINE EDISYLATE 5 MG/ML IJ SOLN: 5.0000 mg | Freq: Four times a day (QID) | INTRAMUSCULAR | Status: DC | PRN

## 2017-05-18 NOTE — H&P (Signed)
Physical Medicine and Rehabilitation Admission H&P    Chief Complaint  Patient presents with  . Decreased in functional mobility due to stroke with mental status changes and difficulty speaking.     HPI:  Mario Liu is an 56 y.o. male with history of Down's syndrome (resident of a group home), MDD, COPD, who was admitted on 05/14/17 with mental status changes, right sided weakness and difficulty speaking. MRI brain revealed Left MCA infarct affecting lateral and posterior temporal lobe and extending into left parietal lobe.  CTA head with left M2 branch occlusion.  2D echo with EF 55-60% with + bubble study consistent with PFO/ASD and hypermobile interatrial septum. BLE dopplers negative for DVT. Dr. Erlinda Hong recommended DAPT with ASA 325 mg/Plavix 75 mg X 3 months followed by plavix alone for embolic store of unknown etiology. No at good candidate for PFO closure and 30 day cardiac event  Monitor recommended to rule out A fib. Patient with dysphagia with risk for aspiration, cognitive deficits, severe dysarthria, left gaze preference and unsteady gait. CIR recommended due to deficits in mobility and self-care tasks.     Review of Systems  HENT: Negative for hearing loss and tinnitus.   Eyes: Negative for blurred vision and double vision.  Respiratory: Positive for cough and wheezing.   Cardiovascular: Negative for chest pain.  Gastrointestinal: Positive for constipation. Negative for heartburn and nausea.  Genitourinary: Negative for dysuria.  Musculoskeletal: Negative for back pain and myalgias.  Skin: Negative for rash.  Neurological: Positive for speech change and focal weakness.  Psychiatric/Behavioral: Positive for memory loss.      Past Medical History:  Diagnosis Date  . COPD (chronic obstructive pulmonary disease) ([place])   . Hepatitis C   . Hx of cholecystectomy   . Hypothyroidism   . Kidney cysts   . Major depressive disorder    Was treated at facility in [place] ([facility])  years ago  . Tobacco abuse   . Vitamin D deficiency     Past Surgical History:  Procedure Laterality Date  . LAPAROSCOPIC CHOLECYSTECTOMY       Family History  Problem Relation Age of Onset  . Adopted: Yes  . Diabetes Maternal Grandmother       Social History:  Lives in a group home. Goes to adult day program during the day. Per reports that he has quit smoking 2-3 months ago. His smoking use included Cigarettes. He has a 39.00 pack-year smoking history. He has never used smokeless tobacco. He reports that he does not drink alcohol. Question history of IV drug use in the past.     Allergies: No Known Allergies    Medications Prior to Admission  Medication Sig Dispense Refill  . albuterol (PROVENTIL HFA;VENTOLIN HFA) 108 (90 Base) MCG/ACT inhaler Inhale 2 puffs into the lungs every 6 (six) hours as needed for wheezing or shortness of breath.    Marland Kitchen alendronate (FOSAMAX) 70 MG tablet Take 70 mg by mouth once a week. Fridays    . aspirin 325 MG tablet Take 1 tablet (325 mg total) by mouth daily. 90 tablet 0  . atorvastatin (LIPITOR) 40 MG tablet Take 1 tablet (40 mg total) by mouth daily at 6 PM. 30 tablet 0  . buPROPion (WELLBUTRIN XL) 150 MG 24 hr tablet Take 150 mg by mouth daily.     . calcium carbonate (OS-CAL) 600 MG TABS tablet Take 600 mg by mouth 2 (two) times daily with a meal.     .  cetirizine (ZYRTEC) 10 MG tablet Take 10 mg by mouth daily.    . Cholecalciferol (VITAMIN D3) 50000 units CAPS Take 1 Can by mouth every 7 (seven) days. Thursdays    . citalopram (CELEXA) 40 MG tablet Take 40 mg by mouth daily.     . clonazePAM (KLONOPIN) 0.5 MG tablet Take 0.5 mg by mouth daily. *may also take an additional 0.5mg  tab as needed for anxiety/agitation    . clopidogrel (PLAVIX) 75 MG tablet Take 1 tablet (75 mg total) by mouth daily. 90 tablet 0  . fluticasone (FLONASE) 50 MCG/ACT nasal spray Place 2 sprays into the nose daily as needed for allergies.     . furosemide (LASIX) 40 MG  tablet Take 40 mg by mouth daily.    . meloxicam (MOBIC) 15 MG tablet Take 15 mg by mouth daily.    . polyethylene glycol (MIRALAX / GLYCOLAX) packet Take 17 g by mouth 2 (two) times daily. 14 each 0  . potassium chloride (K-DUR) 10 MEQ tablet Take 10 mEq by mouth daily.     . risperiDONE (RISPERDAL) 3 MG tablet Take 3 mg by mouth 2 (two) times daily.    Marland Kitchen senna-docusate (SENOKOT-S) 8.6-50 MG tablet Take 2 tablets by mouth 2 (two) times daily. 60 tablet 0  . traZODone (DESYREL) 50 MG tablet Take 50 mg by mouth at bedtime.       Home: Home Living Family/patient expects to be discharged to:: Group home Available Help at Discharge: Available 24 hours/day Type of Home: Group Home  Lives With: Other (Comment)   Functional History: Prior Function Level of Independence: Needs assistance Gait / Transfers Assistance Needed: Amb w/o AD per pt sister ADL's / Homemaking Assistance Needed: Lives in group home, does own ADL's, bathes/dress/toileting. Has assistance with Meals and homaking. Has chores; 5 days per week goes to school and has jobs there as well Communication / Swallowing Assistance Needed: Pt has always had some garbled speech but sister states that he was able to be understood if you asked him a question a second time. Aphasia  Functional Status:  Mobility: Bed Mobility Overal bed mobility: Needs Assistance Bed Mobility: Supine to Sit, Sit to Supine Supine to sit: Supervision Sit to supine: Supervision General bed mobility comments: impulsive, but generally safe Transfers Overall transfer level: Needs assistance Equipment used: 1 person hand held assist Transfers: Sit to/from Stand Sit to Stand: Supervision Stand pivot transfers: Supervision General transfer comment: pt fairly safe with transfers Ambulation/Gait Ambulation/Gait assistance: Supervision Ambulation Distance (Feet): 100 Feet (x2) Assistive device: None Gait Pattern/deviations: Step-through pattern General Gait  Details: generally steady gait, less attension given to R side, but generally negligile Gait velocity interpretation: at or above normal speed for age/gender    ADL: ADL Overall ADL's : Needs assistance/impaired Eating/Feeding: NPO Grooming: Wash/dry hands, Wash/dry face, Standing, Minimal assistance Grooming Details (indicate cue type and reason): Gaze preference to the left. Requires increased vc's to look to right during ADL's  Upper Body Bathing: Minimal assistance, Sitting, Set up Lower Body Bathing: Min guard, Sitting/lateral leans, Sit to/from stand, Cueing for safety Upper Body Dressing : Minimal assistance, Sitting, Cueing for safety Lower Body Dressing: Moderate assistance, Sit to/from stand Toilet Transfer: Ambulation, Comfort height toilet, Minimal assistance (+1 hand held assist initially, then min quard while standing at toilet to urinate) Toileting- Water quality scientist and Hygiene: Min guard, Sit to/from stand Functional mobility during ADLs: Supervision/safety, Min guard, Cueing for safety (+1 hand held assist with vc's for safety  when ambulating in room for ADL's) General ADL Comments: Pt was assessed for OT followed by participation in ADL retrainign session to include toileting, LB dressing, washing hands/face standing at sink and some bathing sitting at EOB. Pt is with noted L gaze preference and difficulty attending to R side as well as difficulty following 1 step commands. Cont to assess if this is related to L MCA Infarct or a combination of this as well as his cognitive deficits.  Cognition: Cognition Overall Cognitive Status: History of cognitive impairments - at baseline Arousal/Alertness: Awake/alert Orientation Level: Oriented to person, Oriented to situation, Oriented to place Attention: Focused, Sustained Focused Attention: Appears intact Sustained Attention: Impaired Sustained Attention Impairment: Verbal basic, Functional basic Awareness:  Impaired Awareness Impairment: Intellectual impairment Problem Solving: Impaired Problem Solving Impairment: Verbal basic, Functional basic Safety/Judgment: Impaired Cognition Arousal/Alertness: Awake/alert Behavior During Therapy: Flat affect Overall Cognitive Status: History of cognitive impairments - at baseline General Comments: When asked if he had any pain, pt was unintelligible but when sister asked him again, he indicated that his head hurt. He was asked "A lot or a little?" and shown hand gestures at the same time and did not respond. His sister then asked "From 1-10, how bad is it?" and Pt stated "10" clearly. Difficult to assess due to: Impaired communication  Physical Exam: Blood pressure 113/62, pulse 76, temperature 98.3 F (36.8 C), temperature source Oral, resp. rate 18, height 5\' 8"  (1.727 m), weight 100 kg (220 lb 7.4 oz), SpO2 98 %. Physical Exam  Nursing note and vitals reviewed. Constitutional: He appears well-developed and well-nourished.  HENT:  Head: Normocephalic and atraumatic.  Mouth/Throat: Oropharynx is clear and moist.  Eyes: Pupils are equal, round, and reactive to light. Conjunctivae and EOM are normal.  Neck: Normal range of motion. Neck supple.  Cardiovascular: Normal rate and regular rhythm.   Respiratory: Effort normal. Stridor present. He has wheezes.  Audible wheezing noted.  Occasional cough due to difficulty with oral secretions (unable to lie flat).   GI: Soft. Bowel sounds are normal.  Musculoskeletal: He exhibits no edema or tenderness.  Neurological: He is alert.  Oriented to self and place " Hospital". Severe dysarthria with one word answers. Right visual deficits with right inattention. Able to follow simple one step motor commands. Slow to process.   Skin: Skin is warm and dry.  Psychiatric: His affect is blunt. His speech is delayed and slurred. He is slowed. Cognition and memory are impaired.    Results for orders placed or performed  during the hospital encounter of 05/14/17 (from the past 48 hour(s))  Glucose, capillary     Status: None   Collection Time: 05/17/17  4:11 PM  Result Value Ref Range   Glucose-Capillary 96 65 - 99 mg/dL   No results found.     Medical Problem List and Plan: 1.  Functional, language, and swallowing deficits secondary to left MCA infarct  -admit to inpatient rehab 2.  DVT Prophylaxis/Anticoagulation: Pharmaceutical: Lovenox 3. Pain Management: N/A 4. Mood: LCSW to follow for evaluation and support.  5. Neuropsych: This patient is not capable of making decisions on his own behalf. 6. Skin/Wound Care: routine pressure relief measures.  7. Fluids/Electrolytes/Nutrition: Monitor I/O. Will check lytes in am. 8. COPD/Asthma: Will add albuterol nebs prn. Sister reports that he quit smoking a couple of months ago "with use of medication" 9. Major depressive disorder: On Wellbutrin XL, Celexa, Risperdal and Klonopin 10.  H/o Chronic Hep C with hepatitis/Thrombocytopenia: Recheck  CBC in am. Discontinue Lovenox if platelets drop further.   11. Constipation: Start bowel program.  12. Dysphagia: Full supervision for safety. Dysphagia 1, thin liquids.    Post Admission Physician Evaluation: 1. Functional deficits secondary  to left MCA infarct. 2. Patient is admitted to receive collaborative, interdisciplinary care between the physiatrist, rehab nursing staff, and therapy team. 3. Patient's level of medical complexity and substantial therapy needs in context of that medical necessity cannot be provided at a lesser intensity of care such as a SNF. 4. Patient has experienced substantial functional loss from his/her baseline which was documented above under the "Functional History" and "Functional Status" headings.  Judging by the patient's diagnosis, physical exam, and functional history, the patient has potential for functional progress which will result in measurable gains while on inpatient rehab.   These gains will be of substantial and practical use upon discharge  in facilitating mobility and self-care at the household level. 5. Physiatrist will provide 24 hour management of medical needs as well as oversight of the therapy plan/treatment and provide guidance as appropriate regarding the interaction of the two. 6. The Preadmission Screening has been reviewed and patient status is unchanged unless otherwise stated above. 7. 24 hour rehab nursing will assist with bladder management, bowel management, safety, skin/wound care, disease management, medication administration, pain management and patient education  and help integrate therapy concepts, techniques,education, etc. 8. PT will assess and treat for/with: Lower extremity strength, range of motion, stamina, balance, functional mobility, safety, adaptive techniques and equipment, NMR< .   Goals are: mod I. 9. OT will assess and treat for/with: ADL's, functional mobility, safety, upper extremity strength, adaptive techniques and equipment, NMR, cognitive-pereptual rx.   Goals are: mod I to supervision. Therapy may proceed with showering this patient. 10. SLP will assess and treat for/with: speech, language, cognition, swallowing.  Goals are: supervision. 11. Case Management and Social Worker will assess and treat for psychological issues and discharge planning. 12. Team conference will be held weekly to assess progress toward goals and to determine barriers to discharge. 13. Patient will receive at least 3 hours of therapy per day at least 5 days per week. 14. ELOS: 6-8 days       15. Prognosis:  excellent     Meredith Staggers, MD, [facility] 05/18/2017  Bary Leriche, Hershal Coria 05/18/2017

## 2017-05-18 NOTE — Progress Notes (Addendum)
Inpatient Rehabilitation  Met with patient to discuss team's recommendation for IP Rehab and shared booklets. I also called sister, [name], who is in favor of a short IP Rehab stay in order for him to regain his independence.  I have asked my physician to review his case and will follow up with the team shortly to clarify potential bed offer.  Please call with questions.   Update: Dr. Naaman Plummer has reviewed case and is in agreement with a short length of stay to maximize functional independence.  I now await acute medical clearance; text paged MD.  Will follow up with the team when I know.   Carmelia Roller., CCC/SLP Admission Coordinator  [place]  Cell [phone]

## 2017-05-18 NOTE — Progress Notes (Signed)
Physical Therapy Treatment Patient Details Name: Mario Liu MRN: [id number] DOB: 03-17-1961 Today's Date: 05/18/2017    History of Present Illness Mario Liu is a 56 y.o. male with a history of depression, hypothyroidism, some type of cognitive disorder/Down Syndrome which requires him to live in a group home. PMH also includes: PMH includes: COPD (chronic obstructive pulmonary disease); hypoactivethyroidism, Kidney cyst, major depressive disorder, prior tobacco abuse, Vit D deficiency. He was admitted with Dx: aphasia, L MCA Infarct. Per his sister, he had somewhat garbled speech prior to this admit, but it is now worse.     PT Comments    Emphasis on gait, attending to his environment, including catching a ball in all visual fields with pt stationary or mobile.    Follow Up Recommendations  CIR     Equipment Recommendations  None recommended by PT    Recommendations for Other Services       Precautions / Restrictions Precautions Precautions: Fall (but improving)    Mobility  Bed Mobility Overal bed mobility: Needs Assistance Bed Mobility: Supine to Sit;Sit to Supine     Supine to sit: Supervision Sit to supine: Supervision   General bed mobility comments: impulsive, but safe  Transfers Overall transfer level: Needs assistance   Transfers: Sit to/from Stand Sit to Stand: Min guard;Supervision         General transfer comment: steady, but not safety conscious  Ambulation/Gait Ambulation/Gait assistance: Supervision Ambulation Distance (Feet): 100 Feet (x4) Assistive device: None Gait Pattern/deviations: Step-through pattern Gait velocity: slower and deliberate. Gait velocity interpretation: Below normal speed for age/gender General Gait Details: generally steady, often missing information presented from the right unless previously called attension to.[place] doors, signage.  thrown or bounced ball.   Stairs            Wheelchair Mobility     Modified Rankin (Stroke Patients Only)       Balance Overall balance assessment: Needs assistance Sitting-balance support: No upper extremity supported Sitting balance-Leahy Scale: Good     Standing balance support: No upper extremity supported Standing balance-Leahy Scale: Good Standing balance comment: worked on catch ball bounced or thrown in all fields., moving and stationary.                            Cognition Arousal/Alertness: Awake/alert Behavior During Therapy: Flat affect Overall Cognitive Status: History of cognitive impairments - at baseline                                        Exercises      General Comments General comments (skin integrity, edema, etc.): pt missing info, ball, etc in R field of vision, whether attensional or visual or both..  Pt also not consistently following simple direction often needing cues.  pt not managing his secretions.      Pertinent Vitals/Pain Pain Assessment: Faces Faces Pain Scale: Hurts little more Pain Location: lower back Pain Descriptors / Indicators: Sore Pain Intervention(s): Monitored during session    Home Living                      Prior Function            PT Goals (current goals can now be found in the care plan section) Acute Rehab PT Goals Patient Stated Goal: Pt unable, per Sister,  goal is to return to [place], Mod I ADL's w/ assist for Homemaking, chores and School 5x/week PT Goal Formulation: With patient Time For Goal Achievement: 05/22/17 Potential to Achieve Goals: Good Progress towards PT goals: Progressing toward goals    Frequency    Min 3X/week      PT Plan Current plan remains appropriate    Co-evaluation              AM-PAC PT "6 Clicks" Daily Activity  Outcome Measure  Difficulty turning over in bed (including adjusting bedclothes, sheets and blankets)?: None Difficulty moving from lying on back to sitting on the side of the  bed? : None Difficulty sitting down on and standing up from a chair with arms (e.g., wheelchair, bedside commode, etc,.)?: None Help needed moving to and from a bed to chair (including a wheelchair)?: A Little Help needed walking in hospital room?: A Little Help needed climbing 3-5 steps with a railing? : A Little 6 Click Score: 21    End of Session   Activity Tolerance: Patient tolerated treatment well Patient left: in bed;with call bell/phone within reach;with bed alarm set Nurse Communication: Mobility status PT Visit Diagnosis: Unsteadiness on feet (R26.81);Other abnormalities of gait and mobility (R26.89)     Time: 3300-7622 PT Time Calculation (min) (ACUTE ONLY): 17 min  Charges:  $Gait Training: 8-22 mins                    G Codes:       2017-05-25  Mario Liu, PT [phone]  (pager)   Mario Liu Mario Liu 2017-05-25, 5:19 PM

## 2017-05-18 NOTE — Progress Notes (Signed)
Patient arrived to unit with sister and brother in law. Rehab processes explained. Patient educated about using call bell. Soft touch call bell implemented because patient unable to locate button on regular. Questions answered. Resting comfortably.

## 2017-05-18 NOTE — Care Management Important Message (Signed)
Important Message  Patient Details  Name: Mario Liu MRN: [id number] Date of Birth: [birth year]   Medicare Important Message Given:  Yes    [name] 05/18/2017, 11:19 AM

## 2017-05-18 NOTE — Progress Notes (Signed)
   Subjective: Difficult to understand completely but doing well over the interval. Wanting to go home today. Continues to have trouble swallowing but otherwise has not questions or concerns. Denies pain, SOA, chest pain, palpitations, worsening of presenting symptoms.   Objective: Vital signs in last 24 hours: Vitals:   05/17/17 2159 05/17/17 2205 05/18/17 0123 05/18/17 0623  BP: (!) 142/124 116/66 92/67 (!) 79/45  Pulse: 66  80 73  Resp: 18  18 18   Temp: 98 F (36.7 C)  98.8 F (37.1 C) 98.2 F (36.8 C)  TempSrc: Oral  Oral Oral  SpO2: 100%  99% 98%  Weight:      Height:       Physical Exam  Constitutional: He appears well-developed and well-nourished.  Cardiovascular: Normal rate, regular rhythm, normal heart sounds and intact distal pulses.   Pulmonary/Chest: Effort normal and breath sounds normal.  Abdominal: Soft. Bowel sounds are normal.  Musculoskeletal: He exhibits no edema.  Neurological: He is alert.  R sided facial droop, gross strength intact upper and lower extremities, dysarthric   Assessment/Plan: Principal Problem:   Acute ischemic left MCA stroke (HCC) Active Problems:   Down's syndrome   PFO with atrial septal aneurysm  1. Left Acute MCA Infarct  - CT Head on 8/30 illustrating moderate sized acute left MCA infarct. Embolus involving the Left MCA vessel.  - MRI Brain on 9/1 illustrating increase in size of previous infarct to include the ischemic penumbra as well as changes suggesting transient ischemia in the L anterior MCA distribution with subsequent reperfusion hyperemia.  - CTA neck showed no stenosis, atherosclerosis, or embolic source in the carotid arteries - Echo with bubble significant for PFO/ASF - LE dopplers unremarkable  - A1c 5.9  - Atorvastatin 40 mg PO QD - ASA 325 mg & Plavix 75 mg daily for 3 months then Plavix alone per neuro  - Continue PT/OT: Recommending 24 hour supervision/assistance, CIR or other - Continue Speech: Recommending  dysphagia 1 diet - Awaiting CIR placement vs home with SLP/OT  2. Hepatitis C Positive Antibody  - Previously positive. Evaluate by GI as an outpatient on 02/06/17 with HCV RNA that was undetectable - No further work-up needed.   3. COPD - Continue to monitor O2 Sats   Diet: Dysphagia 1 VTE ppx: Lovenox Code Status: Full   Dispo: Anticipated discharge to CIR vs home with 24 hour supervision and therapy in 1-2 days.  Ina Homes, MD 05/18/2017, 6:52 AM My Pager: [phone]

## 2017-05-18 NOTE — Care Management Note (Signed)
Case Management Note  Patient Details  Name: Mario Liu MRN: [id number] Date of Birth: Dec 18, 1960  Subjective/Objective:                    Action/Plan: Pt discharging to CIR today. No further needs per CM.   Expected Discharge Date:                  Expected Discharge Plan:  [place]  In-House Referral:     Discharge planning Services  CM Consult  Post Acute Care Choice:    Choice offered to:     DME Arranged:    DME Agency:     HH Arranged:    [place] Agency:     Status of Service:  Completed, signed off  If discussed at [employer] of Stay Meetings, dates discussed:    Additional Comments:  Pollie Friar, RN 05/18/2017, 11:31 AM

## 2017-05-18 NOTE — PMR Pre-admission (Signed)
PMR Admission Coordinator Pre-Admission Assessment  Patient: Mario Liu is an 56 y.o., male MRN: [id number] DOB: 1960-12-16 Height: 5\' 8"  (172.7 cm) Weight: 100 kg (220 lb 7.4 oz)              Insurance Information HMO:     PPO:      PCP:      IPA:      80/20:      OTHER:  PRIMARY: Medicare A & B      Policy#: [id number]      Subscriber: Self  CM Name:       Phone#:      Fax#:  Pre-Cert#: Eligible      Employer: Disabled  Benefits:  Phone #: Verified online     Name: [employer] One  Eff. Date: A: 02/13/1993 B:03/15/2009     Deduct: $1340      Out of Pocket Max: N/A      Life Max: N/A CIR: 100%      SNF: 100% days 1-20, 80% days 21-100 Outpatient: 80%     Co-Pay: 20% Home Health: 100%      Co-Pay: None DME: 80%     Co-Pay: 20% Providers: Patient's Choice   SECONDARY: Medicaid [place] [facility]      Policy#: [id number]      Subscriber: Self CM Name:       Phone#:      Fax#:  Pre-Cert#: Coverage code: [id number]      Employer: Disabled  Benefits:  Phone #: [phone]     Name: Automated  Eff. Date: Eligible as of 05/18/17     Deduct:       Out of Pocket Max:       Life Max:  CIR:       SNF:  Outpatient:      Co-Pay:  Home Health:       Co-Pay:  DME:      Co-Pay:   Medicaid Application Date:       Case Manager:  Disability Application Date:       Case Worker:   Emergency Facilities manager Information    Name Relation Home Work Mobile   Moore,Sharon Legal Guardian [phone]     Dondra Prader [phone]       Current Medical History  Patient Admitting Diagnosis: Left MCA infarct  History of Present Illness: Mario Liu is a 56 y.o. right hand male with history of COPD, tobacco abuse, Down's syndrome currently living in a group home. He was independent with his own ADLs bathing dressing and toileting prior to admission as well as walking in the community to the store. Presented 05/14/2017 with aphasia and altered mental status as reported by his caregiver. Cranial CT scan  showed moderate size acute left MCA infarct. CT cerebral perfusion scan with left M2 branch occlusion. Patient did not receive TPA. CT angiogram head and neck with no stenosis atherosclerosis or embolic source identified. Neurology consulted with workup ongoing. Echocardiogram ejection fraction of 60%. Lower extremity Dopplers negative for DVT. Currently maintained on aspirin for CVA prophylaxis. Subcutaneous Lovenox for DVT prophylaxis. Currently with a dysphagia 1 diet and water with oral care via suctioning before and after PO. Therapy evaluation completed with recommendations of physical medicine rehabilitation consult with more OT and SLP needs. Patient admitted for CIR 05/18/17.   NIH Total: 2    Past Medical History  Past Medical History:  Diagnosis Date  . COPD (chronic obstructive pulmonary disease) ([place])   .  Hx of cholecystectomy   . Hypothyroidism   . Kidney cysts   . Major depressive disorder   . Tobacco abuse   . Vitamin D deficiency     Family History  family history is not on file.  Prior Rehab/Hospitalizations:  Has the patient had major surgery during 100 days prior to admission? No  Current Medications   Current Facility-Administered Medications:  .  acetaminophen (TYLENOL) tablet 650 mg, 650 mg, Oral, Q4H PRN **OR** acetaminophen (TYLENOL) solution 650 mg, 650 mg, Per Tube, Q4H PRN **OR** acetaminophen (TYLENOL) suppository 650 mg, 650 mg, Rectal, Q4H PRN, Maryellen Pile, MD .  aspirin suppository 300 mg, 300 mg, Rectal, Daily, 300 mg at 05/14/17 2154 **OR** aspirin tablet 325 mg, 325 mg, Oral, Daily, Maryellen Pile, MD, 325 mg at 05/18/17 0957 .  atorvastatin (LIPITOR) tablet 40 mg, 40 mg, Oral, q1800, Colbert Ewing, MD, 40 mg at 05/17/17 1859 .  buPROPion (WELLBUTRIN XL) 24 hr tablet 150 mg, 150 mg, Oral, Daily, Zada Finders, MD, 150 mg at 05/18/17 0957 .  citalopram (CELEXA) tablet 40 mg, 40 mg, Oral, Daily, Zada Finders, MD, 40 mg at 05/18/17 0957 .   clonazePAM (KLONOPIN) tablet 0.5 mg, 0.5 mg, Oral, Daily, Zada Finders, MD, 0.5 mg at 05/18/17 0957 .  clopidogrel (PLAVIX) tablet 75 mg, 75 mg, Oral, Daily, Rosalin Hawking, MD, 75 mg at 05/18/17 0957 .  enoxaparin (LOVENOX) injection 40 mg, 40 mg, Subcutaneous, Q24H, Maryellen Pile, MD, 40 mg at 05/17/17 2135 .  potassium chloride (K-DUR,KLOR-CON) CR tablet 10 mEq, 10 mEq, Oral, Daily, Zada Finders, MD, 10 mEq at 05/18/17 0957 .  risperiDONE (RISPERDAL) tablet 3 mg, 3 mg, Oral, BID, Zada Finders, MD, 3 mg at 05/18/17 1443  Patients Current Diet: DIET - DYS 1 Room service appropriate? Yes; Fluid consistency: Thin Diet - low sodium heart healthy  Precautions / Restrictions Precautions Precautions:  (minimal fall risk) Restrictions Weight Bearing Restrictions: No   Has the patient had 2 or more falls or a fall with injury in the past year?No  Prior Activity Level Community (5-7x/wk): Prior to admission patient was independent with most self-care tasks and was out in the community daily either going to school or walking to the store.   Home Assistive Devices / Equipment Home Assistive Devices/Equipment: None  Prior Device Use: Indicate devices/aids used by the patient prior to current illness, exacerbation or injury? None of the above  Prior Functional Level Prior Function Level of Independence: Needs assistance Gait / Transfers Assistance Needed: Amb w/o AD per pt sister ADL's / Homemaking Assistance Needed: Lives in group home, does own ADL's, bathes/dress/toileting. Has assistance with Meals and homaking. Has chores; 5 days per week goes to school and has jobs there as well Communication / Swallowing Assistance Needed: Pt has always had some garbled speech but sister states that he was able to be understood if you asked him a question a second time. Aphasia  Self Care: Did the patient need help bathing, dressing, using the toilet or eating? Needed some help bathing back, butt, and  feet per caregiver report    Indoor Mobility: Did the patient need assistance with walking from room to room (with or without device)? Independent  Stairs: Did the patient need assistance with internal or external stairs (with or without device)? Independent  Functional Cognition: Did the patient need help planning regular tasks such as shopping or remembering to take medications? Dependent  Current Functional Level Cognition  Arousal/Alertness: Awake/alert Overall Cognitive Status: History  of cognitive impairments - at baseline Difficult to assess due to: Impaired communication Orientation Level: Oriented to person, Oriented to situation, Oriented to place General Comments: When asked if he had any pain, pt was unintelligible but when sister asked him again, he indicated that his head hurt. He was asked "A lot or a little?" and shown hand gestures at the same time and did not respond. His sister then asked "From 1-10, how bad is it?" and Pt stated "10" clearly. Attention: Focused, Sustained Focused Attention: Appears intact Sustained Attention: Impaired Sustained Attention Impairment: Verbal basic, Functional basic Awareness: Impaired Awareness Impairment: Intellectual impairment Problem Solving: Impaired Problem Solving Impairment: Verbal basic, Functional basic Safety/Judgment: Impaired    Extremity Assessment (includes Sensation/Coordination)  Upper Extremity Assessment: Overall WFL for tasks assessed (Difficulty following commands but overall appears to be WFL's)  Lower Extremity Assessment: Overall WFL for tasks assessed, Difficult to assess due to impaired cognition    ADLs  Overall ADL's : Needs assistance/impaired Eating/Feeding: NPO Grooming: Wash/dry hands, Wash/dry face, Standing, Minimal assistance Grooming Details (indicate cue type and reason): Gaze preference to the left. Requires increased vc's to look to right during ADL's  Upper Body Bathing: Minimal assistance,  Sitting, Set up Lower Body Bathing: Min guard, Sitting/lateral leans, Sit to/from stand, Cueing for safety Upper Body Dressing : Minimal assistance, Sitting, Cueing for safety Lower Body Dressing: Moderate assistance, Sit to/from stand Toilet Transfer: Ambulation, Comfort height toilet, Minimal assistance (+1 hand held assist initially, then min quard while standing at toilet to urinate) Toileting- Water quality scientist and Hygiene: Min guard, Sit to/from stand Functional mobility during ADLs: Supervision/safety, Min guard, Cueing for safety (+1 hand held assist with vc's for safety when ambulating in room for ADL's) General ADL Comments: Pt was assessed for OT followed by participation in ADL retrainign session to include toileting, LB dressing, washing hands/face standing at sink and some bathing sitting at EOB. Pt is with noted L gaze preference and difficulty attending to R side as well as difficulty following 1 step commands. Cont to assess if this is related to L MCA Infarct or a combination of this as well as his cognitive deficits.    Mobility  Overal bed mobility: Needs Assistance Bed Mobility: Supine to Sit, Sit to Supine Supine to sit: Supervision Sit to supine: Supervision General bed mobility comments: impulsive, but generally safe    Transfers  Overall transfer level: Needs assistance Equipment used: 1 person hand held assist Transfers: Sit to/from Stand Sit to Stand: Supervision Stand pivot transfers: Supervision General transfer comment: pt fairly safe with transfers    Ambulation / Gait / Stairs / Wheelchair Mobility  Ambulation/Gait Ambulation/Gait assistance: Supervision Ambulation Distance (Feet): 100 Feet (x2) Assistive device: None Gait Pattern/deviations: Step-through pattern General Gait Details: generally steady gait, less attension given to R side, but generally negligile Gait velocity interpretation: at or above normal speed for age/gender    Posture /  Balance Balance Overall balance assessment: Needs assistance Sitting-balance support: No upper extremity supported Sitting balance-Leahy Scale: Good Standing balance support: No upper extremity supported Standing balance-Leahy Scale: Good    Special needs/care consideration BiPAP/CPAP: No CPM: No Continuous Drip IV: No Dialysis: No         Life Vest: No Oxygen: No Special Bed: No Trach Size: No Wound Vac (area): No       Skin: WDL  Bowel mgmt: Continent, last BM 06/11/17 Bladder mgmt: Continent  Diabetic mgmt: HgbA1c 5.9     Previous Home Environment  Lives With: Other (Comment) Available Help at Discharge: Available 24 hours/day Type of Home: [place] Name: D and [place]: Yes  Discharge Living Setting Plans for Discharge Living Setting: House Type of Home at Discharge: [place] Name at Discharge: [facility] [address] in [place], [place] Discharge Home Layout: One level Discharge Home Access:  (3 steps or ramp ) Discharge Bathroom Shower/Tub: Tub/shower unit, Walk-in shower Discharge Bathroom Toilet: Standard Discharge Bathroom Accessibility: Yes How Accessible: Accessible via walker Does the patient have any problems obtaining your medications?: No  Social/Family/Support Systems Patient Roles: Other (Comment) (Sibling) Contact Information: Sister: [name] [phone] Anticipated Caregiver: Group Home employees Anticipated Caregiver's Contact Information: Tawana Scale [phone] Ability/Limitations of Caregiver: None Caregiver Availability: 24/7 Discharge Plan Discussed with Primary Caregiver: Yes Is Caregiver In Agreement with Plan?: Yes Does Caregiver/Family have Issues with Lodging/Transportation while Pt is in Rehab?: No  Goals/Additional Needs Patient/Family Goal for Rehab: PT/OT Mod I-Supervision  Expected length of stay: ~7 days  Cultural Considerations:  None Dietary Needs: Dys.1 textures with water only and oral care before and after meals with suction toothbrush  Equipment Needs: TBD Special Service Needs: None Additional Information: None Pt/Family Agrees to Admission and willing to participate: Yes Program Orientation Provided & Reviewed with Pt/Caregiver Including Roles  & Responsibilities: Yes Additional Information Needs: None Information Needs to be Provided By: N/A  Barriers to Discharge: Other (comments) (dysphagia and independence with self-care tasks )  Decrease burden of Care through IP rehab admission: No  Possible need for SNF placement upon discharge: No  Patient Condition: This patient's medical and functional status has changed since the consult dated: 05/15/17 in which the Rehabilitation Physician determined and documented that the patient's condition is appropriate for intensive rehabilitative care in an inpatient rehabilitation facility. See "History of Present Illness" (above) for medical update. Functional changes are: Min guard/Supervision level for basic mobility. Patient's medical and functional status update has been discussed with the Rehabilitation physician and patient remains appropriate for inpatient rehabilitation. Will admit to inpatient rehab today.  Preadmission Screen Completed By:  Gunnar Fusi, 05/18/2017 12:37 PM ______________________________________________________________________   Discussed status with Dr. Naaman Plummer on 05/18/17 at 1030 and received telephone approval for admission today.  Admission Coordinator:  Gunnar Fusi, time 1030/Date 05/18/17

## 2017-05-18 NOTE — Progress Notes (Signed)
Inpatient Rehabilitation  I have acute medical clearance and sister, Sharon's agreement.  Will proceed with IP Rehab admission today.  Please call with questions.   Carmelia Roller., CCC/SLP Admission Coordinator  [place]  Cell [phone]

## 2017-05-18 NOTE — Progress Notes (Signed)
  Speech Language Pathology Treatment: Dysphagia  Patient Details Name: Mario Liu MRN: [id number] DOB: 12/10/1960 Today's Date: 05/18/2017 Time: 6948-5462 SLP Time Calculation (min) (ACUTE ONLY): 13 min  Assessment / Plan / Recommendation Clinical Impression  Assisted pt with lunch.  Able to feed himself, but required mod tactile/verbal cues due to impulsivity/poor self regulation.  Pt loaded extremely large boluses onto spoon, and tended to eat quite quickly, leading to copious oral residue of purees.  He was provided water at end of meal, which elicited delayed cough (MBS revealed intermittent aspiration, inconsistently sensed). Speech is dysarthric, but intelligibility appears to be improved since time of initial speech evaluation.  Recommend 1:1 supervision with meals to regulate speed/volume and ensure safety; there is likely an element of impulsivity that is consistent with baseline behavior.  Pt for D/C today to [facility].  HPI HPI: Mario Liu is a 56yo male with PMH significant for depression, Down's syndrome (currently living in a group home), COPD, and tobacco use who presents with aphasia since this morning at around 7am. History obtained from group home caregivers. Patient was in his usual state of health last night before he went to bed around 10pm. He was woken this morning and got up to get dressed. He came down to the kitchen and his caregiver noted that he was holding his underwear and a shirt in his hands, was not talking, and kept looking to his left side. 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 At baseline, patient is able to walk on his own, talk, dress himself, do some chores, but unable to complete activities of daily leaving, including cooking, cleaning, and needs some help with bathing. On interviewing the patient, he endorses that he is hungry, has trouble breathing, and his left shoulder hurts. Denies chest pain, abdominal pain, or  headaches. Caregivers state that he did recognize who they were and that his speech is still different than it has been. He does smoke ~1ppd, no alcohol use or other illicit drugs. His sister, Mario Liu, is his legal guardian. She lives in [place] and is on her way to [place].      SLP Plan  Continue with current plan of care       Recommendations  Diet recommendations: Dysphagia 1 (puree);Thin liquid (water only per recommendations) Liquids provided via: Cup;Straw Medication Administration: Whole meds with puree Supervision: Full supervision/cueing for compensatory strategies Compensations: Slow rate;Small sips/bites;Multiple dry swallows after each bite/sip;Minimize environmental distractions Postural Changes and/or Swallow Maneuvers: Seated upright 90 degrees                Oral Care Recommendations: Oral care before and after PO Follow up Recommendations: Inpatient Rehab SLP Visit Diagnosis: Dysphagia, oropharyngeal phase (R13.12) Plan: Continue with current plan of care       GO                Mario Liu 05/18/2017, 12:12 PM

## 2017-05-19 ENCOUNTER — Inpatient Hospital Stay (HOSPITAL_COMMUNITY): Payer: Medicare Other | Admitting: Physical Therapy

## 2017-05-19 ENCOUNTER — Inpatient Hospital Stay (HOSPITAL_COMMUNITY): Payer: Medicare Other | Admitting: Occupational Therapy

## 2017-05-19 ENCOUNTER — Inpatient Hospital Stay (HOSPITAL_COMMUNITY): Payer: Medicare Other

## 2017-05-19 DIAGNOSIS — I63512 Cerebral infarction due to unspecified occlusion or stenosis of left middle cerebral artery: Secondary | ICD-10-CM

## 2017-05-19 DIAGNOSIS — Q909 Down syndrome, unspecified: Secondary | ICD-10-CM

## 2017-05-19 LAB — COMPREHENSIVE METABOLIC PANEL
ALK PHOS: 37 U/L — AB (ref 38–126)
ALT: 24 U/L (ref 17–63)
ANION GAP: 10 (ref 5–15)
AST: 29 U/L (ref 15–41)
Albumin: 3.5 g/dL (ref 3.5–5.0)
BILIRUBIN TOTAL: 1 mg/dL (ref 0.3–1.2)
BUN: 12 mg/dL (ref 6–20)
CALCIUM: 8.7 mg/dL — AB (ref 8.9–10.3)
CO2: 19 mmol/L — AB (ref 22–32)
CREATININE: 1.02 mg/dL (ref 0.61–1.24)
Chloride: 107 mmol/L (ref 101–111)
GFR calc non Af Amer: >60 mL/min (ref >60)
Glucose, Bld: 171 mg/dL — ABNORMAL HIGH (ref 65–99)
Potassium: 3.9 mmol/L (ref 3.5–5.1)
SODIUM: 136 mmol/L (ref 135–145)
TOTAL PROTEIN: 7.7 g/dL (ref 6.5–8.1)

## 2017-05-19 LAB — CBC WITH DIFFERENTIAL/PLATELET
Basophils Absolute: 0 10*3/uL (ref 0.0–0.1)
Basophils Relative: 0 %
EOS ABS: 0.1 10*3/uL (ref 0.0–0.7)
Eosinophils Relative: 1 %
HEMATOCRIT: 40.5 % (ref 39.0–52.0)
HEMOGLOBIN: 13.4 g/dL (ref 13.0–17.0)
LYMPHS ABS: 1.6 10*3/uL (ref 0.7–4.0)
LYMPHS PCT: 27 %
MCH: 28 pg (ref 26.0–34.0)
MCHC: 33.1 g/dL (ref 30.0–36.0)
MCV: 84.7 fL (ref 78.0–100.0)
MONOS PCT: 6 %
Monocytes Absolute: 0.4 10*3/uL (ref 0.1–1.0)
NEUTROS ABS: 3.9 10*3/uL (ref 1.7–7.7)
NEUTROS PCT: 66 %
Platelets: 113 10*3/uL — ABNORMAL LOW (ref 150–400)
RBC: 4.78 MIL/uL (ref 4.22–5.81)
RDW: 12.6 % (ref 11.5–15.5)
WBC: 5.9 10*3/uL (ref 4.0–10.5)

## 2017-05-19 NOTE — Progress Notes (Signed)
Meredith Staggers, MD Physician Signed Physical Medicine and Rehabilitation  Consult Note Date of Service: 05/15/2017 2:06 PM  Related encounter: ED to Hosp-Admission (Discharged) from 05/14/2017 in [place] Collapse All   [] Hide copied text [] Hover for attribution information      Physical Medicine and Rehabilitation Consult Reason for Consult: Decreased functional mobility Referring Physician: Internal medicine   HPI: Mario Liu is a 56 y.o. right hand male with history of COPD, tobacco abuse, Down's syndrome currently living in a group home. He was independent with his own ADLs bathing dressing and toileting prior to admission. Presented 05/14/2017 with aphasia and altered mental status as reported by his caregiver. Cranial CT scan showed moderate size acute left MCA infarct. CT cerebral perfusion scan with left M2 branch occlusion . Patient did not receive TPA. CT angiogram head and neck with no stenosis atherosclerosis or embolic source identified. Neurology consulted with workup ongoing. Echocardiogram ejection fraction of 60%. Lower extremity Dopplers negative for DVT. Currently maintained on aspirin for CVA prophylaxis. Subcutaneous Lovenox for DVT prophylaxis. Currently with a dysphagia #1 diet. Occupational therapy evaluation completed with recommendations of physical medicine rehabilitation consult.     Review of Systems  Unable to perform ROS: Language       Past Medical History:  Diagnosis Date  . COPD (chronic obstructive pulmonary disease) ([place])   . Hx of cholecystectomy   . Hypothyroidism   . Kidney cysts   . Major depressive disorder   . Tobacco abuse   . Vitamin D deficiency    History reviewed. No pertinent surgical history. History reviewed. No pertinent family history. Social History:  reports that he has quit smoking. His smoking use included Cigarettes. He has a 39.00 pack-year smoking  history. He has never used smokeless tobacco. He reports that he does not drink alcohol or use drugs. Allergies: No Known Allergies       Medications Prior to Admission  Medication Sig Dispense Refill  . albuterol (PROVENTIL HFA;VENTOLIN HFA) 108 (90 Base) MCG/ACT inhaler Inhale 2 puffs into the lungs every 6 (six) hours as needed for wheezing or shortness of breath.    Marland Kitchen alendronate (FOSAMAX) 70 MG tablet Take 70 mg by mouth once a week. Fridays    . buPROPion (WELLBUTRIN XL) 150 MG 24 hr tablet Take 150 mg by mouth daily.     . calcium carbonate (OS-CAL) 600 MG TABS tablet Take 600 mg by mouth 2 (two) times daily with a meal.     . cetirizine (ZYRTEC) 10 MG tablet Take 10 mg by mouth daily.    . Cholecalciferol (VITAMIN D3) 50000 units CAPS Take 1 Can by mouth every 7 (seven) days. Thursdays    . citalopram (CELEXA) 40 MG tablet Take 40 mg by mouth daily.     . clonazePAM (KLONOPIN) 0.5 MG tablet Take 0.5 mg by mouth daily. *may also take an additional 0.5mg  tab as needed for anxiety/agitation    . fluticasone (FLONASE) 50 MCG/ACT nasal spray Place 2 sprays into the nose daily as needed for allergies.     . furosemide (LASIX) 40 MG tablet Take 40 mg by mouth daily.    . meloxicam (MOBIC) 15 MG tablet Take 15 mg by mouth daily.    . potassium chloride (K-DUR) 10 MEQ tablet Take 10 mEq by mouth daily.     . risperiDONE (RISPERDAL) 3 MG tablet Take 3 mg by mouth 2 (two) times  daily.    . sulfamethoxazole-trimethoprim (BACTRIM DS,SEPTRA DS) 800-160 MG tablet Take 1 tablet by mouth 2 (two) times daily.    . traZODone (DESYREL) 50 MG tablet Take 50 mg by mouth at bedtime.       Home: Home Living Family/patient expects to be discharged to:: Group home Available Help at Discharge: Available 24 hours/day Type of Home: Group Home  Lives With: Other (Comment)  Functional History: Prior Function Level of Independence: Needs assistance Gait / Transfers Assistance  Needed: Amb w/o AD per pt sister ADL's / Homemaking Assistance Needed: Lives in group home, does own ADL's, bathes/dress/toileting. Has assistance with Meals and homaking. Has chores; 5 days per week goes to school and has jobs there as well Communication / Swallowing Assistance Needed: Pt has always had some garbled speech but sister states that he was able to be understood if you asked him a question a second time. Aphasia Functional Status:  Mobility: Bed Mobility Overal bed mobility: Needs Assistance Bed Mobility: Supine to Sit, Sit to Supine Supine to sit: Supervision Sit to supine: Min guard Transfers Overall transfer level: Needs assistance Equipment used: 1 person hand held assist Transfers: Sit to/from Stand, Stand Pivot Transfers Sit to Stand: Min assist Stand pivot transfers: Min assist General transfer comment: Pt with noted L gaze perference and did not attend to right during transfers and functional mobility.  ADL: ADL Overall ADL's : Needs assistance/impaired Eating/Feeding: NPO Grooming: Wash/dry hands, Wash/dry face, Standing, Minimal assistance Grooming Details (indicate cue type and reason): Gaze preference to the left. Requires increased vc's to look to right during ADL's  Upper Body Bathing: Minimal assistance, Sitting, Set up Lower Body Bathing: Min guard, Sitting/lateral leans, Sit to/from stand, Cueing for safety Upper Body Dressing : Minimal assistance, Sitting, Cueing for safety Lower Body Dressing: Moderate assistance, Sit to/from stand Toilet Transfer: Ambulation, Comfort height toilet, Minimal assistance (+1 hand held assist initially, then min quard while standing at toilet to urinate) Toileting- Water quality scientist and Hygiene: Min guard, Sit to/from stand Functional mobility during ADLs: Supervision/safety, Min guard, Cueing for safety (+1 hand held assist with vc's for safety when ambulating in room for ADL's) General ADL Comments: Pt was assessed  for OT followed by participation in ADL retrainign session to include toileting, LB dressing, washing hands/face standing at sink and some bathing sitting at EOB. Pt is with noted L gaze preference and difficulty attending to R side as well as difficulty following 1 step commands. Cont to assess if this is related to L MCA Infarct or a combination of this as well as his cognitive deficits.  Cognition: Cognition Overall Cognitive Status: History of cognitive impairments - at baseline Arousal/Alertness: Awake/alert Orientation Level: Oriented to person Attention: Focused, Sustained Focused Attention: Appears intact Sustained Attention: Impaired Sustained Attention Impairment: Verbal basic, Functional basic Awareness: Impaired Awareness Impairment: Intellectual impairment Problem Solving: Impaired Problem Solving Impairment: Verbal basic, Functional basic Safety/Judgment: Impaired Cognition Arousal/Alertness: Awake/alert Behavior During Therapy: Flat affect, WFL for tasks assessed/performed Overall Cognitive Status: History of cognitive impairments - at baseline General Comments: When asked if he had any pain, pt was unintelligible but when sister asked him again, he indicated that his head hurt. He was asked "A lot or a little?" and shown hand gestures at the same time and did not respond. His sister then asked "From 1-10, how bad is it?" and Pt stated "10" clearly. Difficult to assess due to: Impaired communication  Blood pressure 114/70, pulse 80, temperature 97.7 F (36.5  C), temperature source Oral, resp. rate 16, SpO2 98 %. Physical Exam  HENT:  Right facial droop.  Eyes: EOM are normal.  Neck: Normal range of motion. Neck supple. No thyromegaly present.  Cardiovascular: Normal rate, regular rhythm and normal heart sounds.   Respiratory: Effort normal and breath sounds normal. No respiratory distress.  GI: Soft. Bowel sounds are normal. He exhibits no distension.  Neurological:  He is alert.  Exam somewhat limited by language barrier and patient being cooperative. He did follow basic commands and uses hand gestures. Right sided weakness  Skin: Skin is warm and dry.    Lab Results Last 24 Hours       Results for orders placed or performed during the hospital encounter of 05/14/17 (from the past 24 hour(s))  Hemoglobin A1c     Status: Abnormal   Collection Time: 05/15/17  4:33 AM  Result Value Ref Range   Hgb A1c MFr Bld 5.9 (H) 4.8 - 5.6 %   Mean Plasma Glucose 122.63 mg/dL  Lipid panel     Status: Abnormal   Collection Time: 05/15/17  4:33 AM  Result Value Ref Range   Cholesterol 138 0 - 200 mg/dL   Triglycerides 125 <150 mg/dL   HDL 33 (L) >40 mg/dL   Total CHOL/HDL Ratio 4.2 RATIO   VLDL 25 0 - 40 mg/dL   LDL Cholesterol 80 0 - 99 mg/dL  Basic metabolic panel     Status: Abnormal   Collection Time: 05/15/17  4:33 AM  Result Value Ref Range   Sodium 136 135 - 145 mmol/L   Potassium 4.1 3.5 - 5.1 mmol/L   Chloride 108 101 - 111 mmol/L   CO2 23 22 - 32 mmol/L   Glucose, Bld 100 (H) 65 - 99 mg/dL   BUN 10 6 - 20 mg/dL   Creatinine, Ser 1.19 0.61 - 1.24 mg/dL   Calcium 8.6 (L) 8.9 - 10.3 mg/dL   GFR calc non Af Amer >60 >60 mL/min   GFR calc Af Amer >60 >60 mL/min   Anion gap 5 5 - 15      Imaging Results (Last 48 hours)  Ct Angio Head W Or Wo Contrast  Result Date: 05/14/2017 CLINICAL DATA:  Neuro deficit.  Infarct by head CT. EXAM: CT ANGIOGRAPHY HEAD AND NECK CT PERFUSION BRAIN TECHNIQUE: Multidetector CT imaging of the head and neck was performed using the standard protocol during bolus administration of intravenous contrast. Multiplanar CT image reconstructions and MIPs were obtained to evaluate the vascular anatomy. Carotid stenosis measurements (when applicable) are obtained utilizing NASCET criteria, using the distal internal carotid diameter as the denominator. Multiphase CT imaging of the brain was performed  following IV bolus contrast injection. Subsequent parametric perfusion maps were calculated using RAPID software. CONTRAST:  Dose currently not available, reference EMR COMPARISON:  Noncontrast head CT earlier today at [facility], 8:26 a.m. FINDINGS: CT HEAD FINDINGS Brain: There is gray-white differentiation loss in the posterior left insula, temporal, and low frontal parietal cortex. No progression since prior. No hemorrhagic conversion. The basal ganglia and posterior limb internal capsule or preserved. Vascular: Hyperdense vessel in the left sylvian fissure as previously noted. Skull: No acute or aggressive finding Sinuses/Orbits: Chronic sinusitis changes with polypoid features. ASPECTS [facility] Stroke Program Early CT Score) - Ganglionic level infarction (caudate, lentiform nuclei, internal capsule, insula, M1-M3 cortex): 4 (abnormal posterior insula, M1, and M2 segments) - Supraganglionic infarction (M4-M6 cortex): 2 (abnormal M6 cortex) Total score (0-10 with 10  being normal): 6 Review of the MIP images confirms the above findings CTA NECK FINDINGS Aortic arch: No acute finding or atheromatous changes. Two vessel branching. Right carotid system: Intermittent motion artifact at the level of the ICA and CCA. No stenosis, ulceration, or atheromatous changes. Negative for dissection. Left carotid system: Mild intermittent motion artifact. ICA tortuosity with mild kinking at the proximal segment. No stenosis, ulceration, or dissection. Vertebral arteries: No proximal subclavian or brachiocephalic stenosis. Codominant vertebrals that are smooth and widely patent to the dura. Skeleton: No acute or aggressive finding. Other neck: Symmetric tonsillar prominence. No noted mass or inflammation. Upper chest: Negative Review of the MIP images confirms the above findings CTA HEAD FINDINGS Anterior circulation: Left M2 branch occlusion correlating with noncontrast CT findings. No more proximal embolus or stenosis  noted. No convincing atheromatous changes or beading. Negative for aneurysm. Posterior circulation: Symmetric vertebral arteries. Smooth and widely patent vertebrobasilar system. Symmetric patency of posterior cerebral arteries with possible mild atherosclerotic luminal irregularity. Venous sinuses: Patent Anatomic variants: None significant Delayed phase: Not performed in the emergent setting. Review of the MIP images confirms the above findings CT Brain Perfusion Findings: CBF (<30%) Volume: 67mL Perfusion (Tmax>6.0s) volume: 66mL Mismatch Volume: 16mL Infarction Location:Left inferior division MCA territory Case discussed in person 05/14/2017 at 10:51 am to Dr. Roland Rack . IMPRESSION: 1. Left M2 branch occlusion with downstream 14 cc of left temporoparietal infarct and 38 cc of ischemia by cerebral perfusion. No progression of aspects compared to CT earlier today. No acute hemorrhage. 2. No stenosis, atherosclerosis, or embolic source identified in the neck. 3. Minimal if any atheromatous changes in the intracranial circulation. Electronically Signed   By: Monte Fantasia M.D.   On: 05/14/2017 11:05   Ct Angio Neck W Or Wo Contrast  Result Date: 05/14/2017 CLINICAL DATA:  Neuro deficit.  Infarct by head CT. EXAM: CT ANGIOGRAPHY HEAD AND NECK CT PERFUSION BRAIN TECHNIQUE: Multidetector CT imaging of the head and neck was performed using the standard protocol during bolus administration of intravenous contrast. Multiplanar CT image reconstructions and MIPs were obtained to evaluate the vascular anatomy. Carotid stenosis measurements (when applicable) are obtained utilizing NASCET criteria, using the distal internal carotid diameter as the denominator. Multiphase CT imaging of the brain was performed following IV bolus contrast injection. Subsequent parametric perfusion maps were calculated using RAPID software. CONTRAST:  Dose currently not available, reference EMR COMPARISON:  Noncontrast head CT  earlier today at [facility], 8:26 a.m. FINDINGS: CT HEAD FINDINGS Brain: There is gray-white differentiation loss in the posterior left insula, temporal, and low frontal parietal cortex. No progression since prior. No hemorrhagic conversion. The basal ganglia and posterior limb internal capsule or preserved. Vascular: Hyperdense vessel in the left sylvian fissure as previously noted. Skull: No acute or aggressive finding Sinuses/Orbits: Chronic sinusitis changes with polypoid features. ASPECTS ([place] Stroke Program Early CT Score) - Ganglionic level infarction (caudate, lentiform nuclei, internal capsule, insula, M1-M3 cortex): 4 (abnormal posterior insula, M1, and M2 segments) - Supraganglionic infarction (M4-M6 cortex): 2 (abnormal M6 cortex) Total score (0-10 with 10 being normal): 6 Review of the MIP images confirms the above findings CTA NECK FINDINGS Aortic arch: No acute finding or atheromatous changes. Two vessel branching. Right carotid system: Intermittent motion artifact at the level of the ICA and CCA. No stenosis, ulceration, or atheromatous changes. Negative for dissection. Left carotid system: Mild intermittent motion artifact. ICA tortuosity with mild kinking at the proximal segment. No stenosis, ulceration, or dissection. Vertebral  arteries: No proximal subclavian or brachiocephalic stenosis. Codominant vertebrals that are smooth and widely patent to the dura. Skeleton: No acute or aggressive finding. Other neck: Symmetric tonsillar prominence. No noted mass or inflammation. Upper chest: Negative Review of the MIP images confirms the above findings CTA HEAD FINDINGS Anterior circulation: Left M2 branch occlusion correlating with noncontrast CT findings. No more proximal embolus or stenosis noted. No convincing atheromatous changes or beading. Negative for aneurysm. Posterior circulation: Symmetric vertebral arteries. Smooth and widely patent vertebrobasilar system. Symmetric patency of  posterior cerebral arteries with possible mild atherosclerotic luminal irregularity. Venous sinuses: Patent Anatomic variants: None significant Delayed phase: Not performed in the emergent setting. Review of the MIP images confirms the above findings CT Brain Perfusion Findings: CBF (<30%) Volume: 82mL Perfusion (Tmax>6.0s) volume: 36mL Mismatch Volume: 56mL Infarction Location:Left inferior division MCA territory Case discussed in person 05/14/2017 at 10:51 am to Dr. Roland Rack . IMPRESSION: 1. Left M2 branch occlusion with downstream 14 cc of left temporoparietal infarct and 38 cc of ischemia by cerebral perfusion. No progression of aspects compared to CT earlier today. No acute hemorrhage. 2. No stenosis, atherosclerosis, or embolic source identified in the neck. 3. Minimal if any atheromatous changes in the intracranial circulation. Electronically Signed   By: Monte Fantasia M.D.   On: 05/14/2017 11:05   Ct Cerebral Perfusion W Contrast  Result Date: 05/14/2017 CLINICAL DATA:  Neuro deficit.  Infarct by head CT. EXAM: CT ANGIOGRAPHY HEAD AND NECK CT PERFUSION BRAIN TECHNIQUE: Multidetector CT imaging of the head and neck was performed using the standard protocol during bolus administration of intravenous contrast. Multiplanar CT image reconstructions and MIPs were obtained to evaluate the vascular anatomy. Carotid stenosis measurements (when applicable) are obtained utilizing NASCET criteria, using the distal internal carotid diameter as the denominator. Multiphase CT imaging of the brain was performed following IV bolus contrast injection. Subsequent parametric perfusion maps were calculated using RAPID software. CONTRAST:  Dose currently not available, reference EMR COMPARISON:  Noncontrast head CT earlier today at [facility], 8:26 a.m. FINDINGS: CT HEAD FINDINGS Brain: There is gray-white differentiation loss in the posterior left insula, temporal, and low frontal parietal cortex. No  progression since prior. No hemorrhagic conversion. The basal ganglia and posterior limb internal capsule or preserved. Vascular: Hyperdense vessel in the left sylvian fissure as previously noted. Skull: No acute or aggressive finding Sinuses/Orbits: Chronic sinusitis changes with polypoid features. ASPECTS ([place] Stroke Program Early CT Score) - Ganglionic level infarction (caudate, lentiform nuclei, internal capsule, insula, M1-M3 cortex): 4 (abnormal posterior insula, M1, and M2 segments) - Supraganglionic infarction (M4-M6 cortex): 2 (abnormal M6 cortex) Total score (0-10 with 10 being normal): 6 Review of the MIP images confirms the above findings CTA NECK FINDINGS Aortic arch: No acute finding or atheromatous changes. Two vessel branching. Right carotid system: Intermittent motion artifact at the level of the ICA and CCA. No stenosis, ulceration, or atheromatous changes. Negative for dissection. Left carotid system: Mild intermittent motion artifact. ICA tortuosity with mild kinking at the proximal segment. No stenosis, ulceration, or dissection. Vertebral arteries: No proximal subclavian or brachiocephalic stenosis. Codominant vertebrals that are smooth and widely patent to the dura. Skeleton: No acute or aggressive finding. Other neck: Symmetric tonsillar prominence. No noted mass or inflammation. Upper chest: Negative Review of the MIP images confirms the above findings CTA HEAD FINDINGS Anterior circulation: Left M2 branch occlusion correlating with noncontrast CT findings. No more proximal embolus or stenosis noted. No convincing atheromatous changes or beading.  Negative for aneurysm. Posterior circulation: Symmetric vertebral arteries. Smooth and widely patent vertebrobasilar system. Symmetric patency of posterior cerebral arteries with possible mild atherosclerotic luminal irregularity. Venous sinuses: Patent Anatomic variants: None significant Delayed phase: Not performed in the emergent setting.  Review of the MIP images confirms the above findings CT Brain Perfusion Findings: CBF (<30%) Volume: 61mL Perfusion (Tmax>6.0s) volume: 68mL Mismatch Volume: 63mL Infarction Location:Left inferior division MCA territory Case discussed in person 05/14/2017 at 10:51 am to Dr. Roland Rack . IMPRESSION: 1. Left M2 branch occlusion with downstream 14 cc of left temporoparietal infarct and 38 cc of ischemia by cerebral perfusion. No progression of aspects compared to CT earlier today. No acute hemorrhage. 2. No stenosis, atherosclerosis, or embolic source identified in the neck. 3. Minimal if any atheromatous changes in the intracranial circulation. Electronically Signed   By: Monte Fantasia M.D.   On: 05/14/2017 11:05   Ct Head Code Stroke Wo Contrast  Result Date: 05/14/2017 CLINICAL DATA:  Code stroke.  Aphasia. EXAM: CT HEAD WITHOUT CONTRAST TECHNIQUE: Contiguous axial images were obtained from the base of the skull through the vertex without intravenous contrast. COMPARISON:  None. FINDINGS: Brain: There is cortical and white matter hypoattenuation consistent with acute infarction involving the left temporal lobe, left parietal lobe, and left insula. No acute intracranial hemorrhage, mass, midline shift, or extra-axial fluid collection is present. The ventricles and sulci are normal. Periventricular white matter hypoattenuation is nonspecific but may reflect very mild chronic small vessel ischemic disease. Vascular: 4 mm hyperdense focus in the left sylvian fissure consistent with an acute MCA branch vessel embolus. Questionable small left MCA embolus more proximally near the expected region of the bifurcation. Skull: No fracture or focal osseous lesion. Sinuses/Orbits: Moderate volume fluid in the left sphenoid sinus. Right sphenoid sinus mucous retention cyst. Partially visualized left maxillary sinus mucous retention cyst. Mild mucosal thickening elsewhere in the ethmoid and maxillary sinuses  bilaterally. Clear mastoid air cells. Unremarkable orbits. Other: None. ASPECTS ([place] Stroke Program Early CT Score) - Ganglionic level infarction (caudate, lentiform nuclei, internal capsule, insula, M1-M3 cortex): 4 (insula, M2, and M3 involvement) - Supraganglionic infarction (M4-M6 cortex): 2 (M6 involvement) Total score (0-10 with 10 being normal): 6 IMPRESSION: 1. Moderate-sized acute left MCA infarct. Embolus involving a left MCA branch vessel in the sylvian fissure. Possible additional embolus more proximally near the MCA bifurcation. 2. ASPECTS is 6. 3. No acute intracranial hemorrhage. These results were called by telephone at the time of interpretation on 05/14/2017 at 8:48 am to Dr. Conni Slipper , who verbally acknowledged these results. Electronically Signed   By: Logan Bores M.D.   On: 05/14/2017 08:52     Assessment/Plan: Diagnosis: left MCA infarct 1. Does the need for close, 24 hr/day medical supervision in concert with the patient's rehab needs make it unreasonable for this patient to be served in a less intensive setting? Yes 2. Co-Morbidities requiring supervision/potential complications: down's syndrome, bp control, secondary stroke complications 3. Due to bladder management, bowel management, safety, skin/wound care, disease management, medication administration and patient education, does the patient require 24 hr/day rehab nursing? Yes 4. Does the patient require coordinated care of a physician, rehab nurse, PT (1-2 hrs/day, 5 days/week), OT (1-2 hrs/day, 5 days/week) and SLP (1-2 hrs/day, 5 days/week) to address physical and functional deficits in the context of the above medical diagnosis(es)? Yes Addressing deficits in the following areas: balance, endurance, locomotion, strength, transferring, bowel/bladder control, bathing, dressing, feeding, grooming, toileting, cognition, speech, language,  swallowing and psychosocial support 5. Can the patient actively participate in an  intensive therapy program of at least 3 hrs of therapy per day at least 5 days per week? Yes 6. The potential for patient to make measurable gains while on inpatient rehab is good 7. Anticipated functional outcomes upon discharge from inpatient rehab are supervision  with PT, supervision and min assist with OT, supervision, min assist and mod assist with SLP. 8. Estimated rehab length of stay to reach the above functional goals is: 11-18 days 9. Anticipated D/C setting: Home 10. Anticipated post D/C treatments: [place] therapy 11. Overall Rehab/Functional Prognosis: excellent  RECOMMENDATIONS: This patient's condition is appropriate for continued rehabilitative care in the following setting: CIR Patient has agreed to participate in recommended program. N/A Note that insurance prior authorization may be required for reimbursement for recommended care.  Comment: Rehab Admissions Coordinator to follow up.  Thanks,  Meredith Staggers, MD, Mellody Drown    Cathlyn Parsons., PA-C 05/15/2017    Revision History                        Routing History

## 2017-05-19 NOTE — Progress Notes (Signed)
[facility] Admission Coordinator Signed Physical Medicine and Rehabilitation  PMR Pre-admission Date of Service: 05/18/2017 12:37 PM  Related encounter: ED to Hosp-Admission (Discharged) from 05/14/2017 in [place]       [] Hide copied text PMR Admission Coordinator Pre-Admission Assessment  Patient: Mario Liu is an 56 y.o., male MRN: [id number] DOB: 06-04-1961 Height: 5\' 8"  (172.7 cm) Weight: 100 kg (220 lb 7.4 oz)                                                                                                                                                  Insurance Information HMO:     PPO:      PCP:      IPA:      80/20:      OTHER:  PRIMARY: Medicare A & B      Policy#: [id number]      Subscriber: Self  CM Name:       Phone#:      Fax#:  Pre-Cert#: Eligible      Employer: Disabled  Benefits:  Phone #: Verified online     Name: Passport One  Eff. Date: A: 02/13/1993 B:03/15/2009     Deduct: $1340      Out of Pocket Max: N/A      Life Max: N/A CIR: 100%      SNF: 100% days 1-20, 80% days 21-100 Outpatient: 80%     Co-Pay: 20% Home Health: 100%      Co-Pay: None DME: 80%     Co-Pay: 20% Providers: Patient's Choice   SECONDARY: Medicaid [place] [employer]      Policy#: [id number]      Subscriber: Self CM Name:       Phone#:      Fax#:  Pre-Cert#: Coverage code: [id number]      Employer: Disabled  Benefits:  Phone #: [phone]     Name: Automated  Eff. Date: Eligible as of 05/18/17     Deduct:       Out of Pocket Max:       Life Max:  CIR:       SNF:  Outpatient:      Co-Pay:  Home Health:       Co-Pay:  DME:      Co-Pay:   Medicaid Application Date:       Case Manager:  Disability Application Date:       Case Worker:   Emergency Contact Information        Contact Information    Name Relation Home Work Mobile   [place] [phone]     Dondra Prader [phone]       Current Medical History    Patient Admitting Diagnosis: Left MCA infarct  History of Present Illness: Mario Liu a 56 y.o.right hand malewith  history of COPD, tobacco abuse, Down's syndrome currently living in a group home. He was independent with his own ADLs bathing dressing and toileting prior to admission as well as walking in the community to the store. Presented 05/14/2017 with aphasia and altered mental status as reported by his caregiver. Cranial CT scan showed moderate size acute left MCA infarct. CT cerebral perfusion scan with left M2 branch occlusion. Patient did not receive TPA. CT angiogram head and neck with no stenosis atherosclerosis or embolic source identified. Neurology consulted with workup ongoing. Echocardiogram ejection fraction of 60%. Lower extremity Dopplers negative for DVT. Currently maintained on aspirin for CVA prophylaxis. Subcutaneous Lovenox for DVT prophylaxis. Currently with a dysphagia 1 diet and water with oral care via suctioning before and after PO. Therapy evaluation completed with recommendations of physical medicine rehabilitation consult with more OT and SLP needs. Patient admitted for CIR 05/18/17.   NIH Total: 2  Past Medical History      Past Medical History:  Diagnosis Date  . COPD (chronic obstructive pulmonary disease) ([place])   . Hx of cholecystectomy   . Hypothyroidism   . Kidney cysts   . Major depressive disorder   . Tobacco abuse   . Vitamin D deficiency     Family History  family history is not on file.  Prior Rehab/Hospitalizations:  Has the patient had major surgery during 100 days prior to admission? No  Current Medications   Current Facility-Administered Medications:  .  acetaminophen (TYLENOL) tablet 650 mg, 650 mg, Oral, Q4H PRN **OR** acetaminophen (TYLENOL) solution 650 mg, 650 mg, Per Tube, Q4H PRN **OR** acetaminophen (TYLENOL) suppository 650 mg, 650 mg, Rectal, Q4H PRN, Maryellen Pile, MD .  aspirin suppository 300 mg, 300 mg,  Rectal, Daily, 300 mg at 05/14/17 2154 **OR** aspirin tablet 325 mg, 325 mg, Oral, Daily, Maryellen Pile, MD, 325 mg at 05/18/17 0957 .  atorvastatin (LIPITOR) tablet 40 mg, 40 mg, Oral, q1800, Colbert Ewing, MD, 40 mg at 05/17/17 1859 .  buPROPion (WELLBUTRIN XL) 24 hr tablet 150 mg, 150 mg, Oral, Daily, Zada Finders, MD, 150 mg at 05/18/17 0957 .  citalopram (CELEXA) tablet 40 mg, 40 mg, Oral, Daily, Zada Finders, MD, 40 mg at 05/18/17 0957 .  clonazePAM (KLONOPIN) tablet 0.5 mg, 0.5 mg, Oral, Daily, Zada Finders, MD, 0.5 mg at 05/18/17 0957 .  clopidogrel (PLAVIX) tablet 75 mg, 75 mg, Oral, Daily, Rosalin Hawking, MD, 75 mg at 05/18/17 0957 .  enoxaparin (LOVENOX) injection 40 mg, 40 mg, Subcutaneous, Q24H, Maryellen Pile, MD, 40 mg at 05/17/17 2135 .  potassium chloride (K-DUR,KLOR-CON) CR tablet 10 mEq, 10 mEq, Oral, Daily, Zada Finders, MD, 10 mEq at 05/18/17 0957 .  risperiDONE (RISPERDAL) tablet 3 mg, 3 mg, Oral, BID, Zada Finders, MD, 3 mg at 05/18/17 4166  Patients Current Diet: DIET - DYS 1 Room service appropriate? Yes; Fluid consistency: Thin Diet - low sodium heart healthy  Precautions / Restrictions Precautions Precautions:  (minimal fall risk) Restrictions Weight Bearing Restrictions: No   Has the patient had 2 or more falls or a fall with injury in the past year?No  Prior Activity Level Community (5-7x/wk): Prior to admission patient was independent with most self-care tasks and was out in the community daily either going to school or walking to the store.   Home Assistive Devices / Equipment Home Assistive Devices/Equipment: None  Prior Device Use: Indicate devices/aids used by the patient prior to current illness, exacerbation or injury? None of the above  Prior Functional Level Prior Function Level of Independence: Needs assistance Gait / Transfers Assistance Needed: Amb w/o AD per pt sister ADL's / Homemaking Assistance Needed: Lives in group home,  does own ADL's, bathes/dress/toileting. Has assistance with Meals and homaking. Has chores; 5 days per week goes to school and has jobs there as well Communication / Swallowing Assistance Needed: Pt has always had some garbled speech but sister states that he was able to be understood if you asked him a question a second time. Aphasia  Self Care: Did the patient need help bathing, dressing, using the toilet or eating? Needed some help bathing back, butt, and feet per caregiver report              Indoor Mobility: Did the patient need assistance with walking from room to room (with or without device)? Independent  Stairs: Did the patient need assistance with internal or external stairs (with or without device)? Independent  Functional Cognition: Did the patient need help planning regular tasks such as shopping or remembering to take medications? Dependent  Current Functional Level Cognition  Arousal/Alertness: Awake/alert Overall Cognitive Status: History of cognitive impairments - at baseline Difficult to assess due to: Impaired communication Orientation Level: Oriented to person, Oriented to situation, Oriented to place General Comments: When asked if he had any pain, pt was unintelligible but when sister asked him again, he indicated that his head hurt. He was asked "A lot or a little?" and shown hand gestures at the same time and did not respond. His sister then asked "From 1-10, how bad is it?" and Pt stated "10" clearly. Attention: Focused, Sustained Focused Attention: Appears intact Sustained Attention: Impaired Sustained Attention Impairment: Verbal basic, Functional basic Awareness: Impaired Awareness Impairment: Intellectual impairment Problem Solving: Impaired Problem Solving Impairment: Verbal basic, Functional basic Safety/Judgment: Impaired    Extremity Assessment (includes Sensation/Coordination)  Upper Extremity Assessment: Overall WFL for tasks assessed  (Difficulty following commands but overall appears to be WFL's)  Lower Extremity Assessment: Overall WFL for tasks assessed, Difficult to assess due to impaired cognition    ADLs  Overall ADL's : Needs assistance/impaired Eating/Feeding: NPO Grooming: Wash/dry hands, Wash/dry face, Standing, Minimal assistance Grooming Details (indicate cue type and reason): Gaze preference to the left. Requires increased vc's to look to right during ADL's  Upper Body Bathing: Minimal assistance, Sitting, Set up Lower Body Bathing: Min guard, Sitting/lateral leans, Sit to/from stand, Cueing for safety Upper Body Dressing : Minimal assistance, Sitting, Cueing for safety Lower Body Dressing: Moderate assistance, Sit to/from stand Toilet Transfer: Ambulation, Comfort height toilet, Minimal assistance (+1 hand held assist initially, then min quard while standing at toilet to urinate) Toileting- Water quality scientist and Hygiene: Min guard, Sit to/from stand Functional mobility during ADLs: Supervision/safety, Min guard, Cueing for safety (+1 hand held assist with vc's for safety when ambulating in room for ADL's) General ADL Comments: Pt was assessed for OT followed by participation in ADL retrainign session to include toileting, LB dressing, washing hands/face standing at sink and some bathing sitting at EOB. Pt is with noted L gaze preference and difficulty attending to R side as well as difficulty following 1 step commands. Cont to assess if this is related to L MCA Infarct or a combination of this as well as his cognitive deficits.    Mobility  Overal bed mobility: Needs Assistance Bed Mobility: Supine to Sit, Sit to Supine Supine to sit: Supervision Sit to supine: Supervision General bed mobility comments: impulsive, but generally safe  Transfers  Overall transfer level: Needs assistance Equipment used: 1 person hand held assist Transfers: Sit to/from Stand Sit to Stand: Supervision Stand pivot  transfers: Supervision General transfer comment: pt fairly safe with transfers    Ambulation / Gait / Stairs / Wheelchair Mobility  Ambulation/Gait Ambulation/Gait assistance: Supervision Ambulation Distance (Feet): 100 Feet (x2) Assistive device: None Gait Pattern/deviations: Step-through pattern General Gait Details: generally steady gait, less attension given to R side, but generally negligile Gait velocity interpretation: at or above normal speed for age/gender    Posture / Balance Balance Overall balance assessment: Needs assistance Sitting-balance support: No upper extremity supported Sitting balance-Leahy Scale: Good Standing balance support: No upper extremity supported Standing balance-Leahy Scale: Good    Special needs/care consideration BiPAP/CPAP: No CPM: No Continuous Drip IV: No Dialysis: No         Life Vest: No Oxygen: No Special Bed: No Trach Size: No Wound Vac (area): No       Skin: WDL                               Bowel mgmt: Continent, last BM 06/11/17 Bladder mgmt: Continent  Diabetic mgmt: [id number]     Previous Home Environment  Lives With: Other (Comment) Available Help at Discharge: Available 24 hours/day Type of Home: [place] Name: D and [place]: Yes  Discharge Living Setting Plans for Discharge Living Setting: House Type of Home at Discharge: [place] Name at Discharge: [facility] [address] in [place], [place] Discharge Home Layout: One level Discharge Home Access:  (3 steps or ramp ) Discharge Bathroom Shower/Tub: Tub/shower unit, Walk-in shower Discharge Bathroom Toilet: Standard Discharge Bathroom Accessibility: Yes How Accessible: Accessible via walker Does the patient have any problems obtaining your medications?: No  Social/Family/Support Systems Patient Roles: Other (Comment) (Sibling) Contact Information: Sister: [name] [phone] Anticipated  Caregiver: Group Home employees Anticipated Caregiver's Contact Information: Tawana Scale [phone] Ability/Limitations of Caregiver: None Caregiver Availability: 24/7 Discharge Plan Discussed with Primary Caregiver: Yes Is Caregiver In Agreement with Plan?: Yes Does Caregiver/Family have Issues with Lodging/Transportation while Pt is in Rehab?: No  Goals/Additional Needs Patient/Family Goal for Rehab: PT/OT Mod I-Supervision  Expected length of stay: ~7 days  Cultural Considerations: None Dietary Needs: Dys.1 textures with water only and oral care before and after meals with suction toothbrush  Equipment Needs: TBD Special Service Needs: None Additional Information: None Pt/Family Agrees to Admission and willing to participate: Yes Program Orientation Provided & Reviewed with Pt/Caregiver Including Roles  & Responsibilities: Yes Additional Information Needs: None Information Needs to be Provided By: N/A  Barriers to Discharge: Other (comments) (dysphagia and independence with self-care tasks )  Decrease burden of Care through IP rehab admission: No  Possible need for SNF placement upon discharge: No  Patient Condition: This patient's medical and functional status has changed since the consult dated: 05/15/17 in which the Rehabilitation Physician determined and documented that the patient's condition is appropriate for intensive rehabilitative care in an inpatient rehabilitation facility. See "History of Present Illness" (above) for medical update. Functional changes are: Min guard/Supervision level for basic mobility. Patient's medical and functional status update has been discussed with the Rehabilitation physician and patient remains appropriate for inpatient rehabilitation. Will admit to inpatient rehab today.  Preadmission Screen Completed By:  Gunnar Fusi, 05/18/2017 12:37 PM ______________________________________________________________________   Discussed status with Dr.  Naaman Plummer on 05/18/17  at 77 and received telephone approval for admission today.  Admission Coordinator:  Gunnar Fusi, time 1030/Date 05/18/17       Cosigned by: Meredith Staggers, MD at 05/18/2017 1:04 PM  Revision History

## 2017-05-19 NOTE — Progress Notes (Signed)
Physical Therapy Assessment and Plan  Patient Details  Name: Mario Liu MRN: [id number] Date of Birth: 1961/05/02  PT Diagnosis: Difficulty walking, Impaired cognition and Muscle weakness Rehab Potential: Good ELOS: 7-10 days   Today's Date: 05/19/2017 PT Individual Time: 0800-0915 PT Individual Time Calculation (min): 75 min    Problem List:  Patient Active Problem List   Diagnosis Date Noted  . Major depressive disorder   . Hepatitis C antibody test positive 05/16/2017  . Acute ischemic left MCA stroke ([place])   . Down's syndrome   . PFO with atrial septal aneurysm     Past Medical History:  Past Medical History:  Diagnosis Date  . COPD (chronic obstructive pulmonary disease) ([place])   . Hepatitis C   . Hx of cholecystectomy   . Hypothyroidism   . Kidney cysts   . Major depressive disorder    Was treated at facility in [place] ([facility]) years ago  . Tobacco abuse   . Vitamin D deficiency    Past Surgical History:  Past Surgical History:  Procedure Laterality Date  . LAPAROSCOPIC CHOLECYSTECTOMY      Assessment & Plan Clinical Impression:Mario Liu an 56 y.o.male with history of Down's syndrome (resident of a group home), MDD, COPD, who was admitted on 05/14/17 with mental status changes, right sided weakness and difficulty speaking. MRI brain revealed Left MCA infarct affecting lateral and posterior temporal lobe and extending into left parietal lobe.  CTA head with left M2 branch occlusion. 2D echo with EF 55-60% with + bubble study consistent with PFO/ASD and hypermobile interatrial septum. BLE dopplers negative for DVT. Dr. Erlinda Hong recommended DAPT with ASA 325 mg/Plavix 75 mg X 3 months followed by plavix alone for embolic store of unknown etiology. No at good candidate for PFO closure and 30 day cardiac event Monitor recommended to rule out A fib. Patient with dysphagia with risk for aspiration, cognitive deficits, severe dysarthria, left gaze preference and unsteady  gait. Patient transferred to CIR on 05/18/2017 .   Patient currently requires min with mobility secondary to muscle weakness, decreased cardiorespiratoy endurance, decreased safety awareness and decreased balance strategies.  Prior to hospitalization, patient was supervision with mobility and lived in a [place] home.  Home access is Pt unable to report how many stairs to get in Stairs to enter.  Patient will benefit from skilled PT intervention to maximize safe functional mobility, minimize fall risk and decrease caregiver burden for planned discharge home with 24 hour supervision.  Anticipate patient will benefit from follow up OP at discharge.  PT - End of Session Activity Tolerance: Tolerates 30+ min activity with multiple rests Endurance Deficit: Yes PT Assessment Rehab Potential (ACUTE/IP ONLY): Good PT Barriers to Discharge: Behavior (decreased safety awareness & cognition ) PT Patient demonstrates impairments in the following area(s): Balance;Perception;Safety;Endurance;Behavior PT Transfers Functional Problem(s): Bed Mobility;Bed to Chair;Car;Furniture PT Locomotion Functional Problem(s): Ambulation;Stairs PT Plan PT Intensity: Minimum of 1-2 x/day ,45 to 90 minutes PT Frequency: 5 out of 7 days PT Duration Estimated Length of Stay: 7-10 days PT Treatment/Interventions: Ambulation/gait training;Balance/vestibular training;Community reintegration;Neuromuscular re-education;Stair training;UE/LE Strength taining/ROM;UE/LE Coordination activities;Discharge planning;Therapeutic Exercise;Patient/family education;Functional mobility training PT Transfers Anticipated Outcome(s): Supervision  PT Locomotion Anticipated Outcome(s): Supervision PT Recommendation Recommendations for Other Services: Speech consult;Neuropsych consult;Therapeutic Recreation consult Therapeutic Recreation Interventions: Pet therapy;Stress management Follow Up Recommendations: 24 hour  supervision/assistance;Outpatient PT Patient destination: Home ([place]) Equipment Recommended: To be determined  Skilled Therapeutic Intervention Pt received lying in bed, agreeable to therapy. Pt  supine>sit with supervision. Pt able to don shirt & pants with supervision, increased time, and verbal cues for technique.Pt required max assist to don brief. Max assist for putting on shoes for time management. Pt sit<>stand with supervision, cues for safety. Pt ambulated 200 ft to gym with no AD and min assist for occasional LOB. Pr required several verbal and visual cues for direction to gym. Pt navigated up/down 8 steps with 2 rails and supervision. Pt ambulated another 150 ft to as above. Pt close supervision with car transfer. Performed 5x sit to stand in 26 seconds (norm cut off score of 12 seconds), indicating LE weakness. Attempted to administer TUG but pt unable to understand directions and proceeded to walk up ramp and down curb rather than around cone. Pt required supervision for ramp and supervision for curb management with 1 rail. Pt ambulated 150 ft back to room as above. Pt performed the Berg balance test, scores below, indicating high risk for falls. Pt left sitting up in chair, quick release belt on, all needs in reach.   PT Evaluation Precautions/Restrictions Precautions Precautions: Fall Restrictions Weight Bearing Restrictions: No General Chart Reviewed: Yes Additional Pertinent History: Pt s/p L MCA infarct   Pain Pain Assessment Pain Assessment: No/denies pain Home Living/Prior Functioning Home Living Type of Home: Group Home Home Access: Stairs to enter Entrance Stairs-Number of Steps: Pt unable to report how many stairs to get in  Entrance Stairs-Rails: Can reach both Prior Function Level of Independence: Needs assistance with ADLs Vision/Perception  Vision - Assessment Additional Comments: no visual deficits effecting mobility   Cognition Orientation Level:  Oriented to person;Disoriented to place;Oriented to situation Safety/Judgment: Impaired Sensation Sensation Light Touch: Appears Intact Proprioception: Appears Intact Coordination Heel Shin Test: Quick movements through partial ROM Motor  Motor Motor: Other (comment) Motor - Skilled Clinical Observations: Generalized LE weakness   Mobility Bed Mobility Bed Mobility: Rolling Right;Rolling Left;Supine to Sit;Sit to Supine Rolling Right: 5: Supervision Rolling Right Details: Other (comment) (no cues) Rolling Left: 5: Supervision Rolling Left Details: Other (comment) (no cues) Supine to Sit: 5: Supervision Supine to Sit Details: Verbal cues for sequencing Sit to Supine: Other (comment) (Pt left sitting up after session) Transfers Transfers: Yes Sit to Stand: 5: Supervision Stand to Sit: 5: Supervision Stand Pivot Transfers: 5: Supervision Stand Pivot Transfer Details: Verbal cues for precautions/safety Locomotion  Ambulation Ambulation: Yes Ambulation/Gait Assistance: 4: Min guard (occasional LOB) Ambulation Distance (Feet): 200 Feet Assistive device: Rolling walker Ambulation/Gait Assistance Details: Verbal cues for safe use of DME/AE Gait Gait: Yes Gait Pattern: Wide base of support (lateral sway) Gait velocity: 2.04 ft/s Stairs / Additional Locomotion Stairs: Yes Stairs Assistance: 5: Supervision Stairs Assistance Details: Verbal cues for precautions/safety Stair Management Technique: Two rails Number of Stairs: 8 Height of Stairs: 6 Ramp: 5: Supervision Curb: 5: Supervision (with 1 hand rail ) Wheelchair Mobility Wheelchair Mobility: No  Trunk/Postural Assessment  Cervical Assessment Cervical Assessment: Within Functional Limits Thoracic Assessment Thoracic Assessment: Within Functional Limits Lumbar Assessment Lumbar Assessment: Within Functional Limits Postural Control Postural Control: Within Functional Limits  Balance Balance Balance Assessed:  Yes Standardized Balance Assessment Standardized Balance Assessment: Berg Balance Test Berg Balance Test Sit to Stand: Able to stand  independently using hands Standing Unsupported: Able to stand safely 2 minutes Sitting with Back Unsupported but Feet Supported on Floor or Stool: Able to sit safely and securely 2 minutes Stand to Sit: Controls descent by using hands Transfers: Able to transfer safely, minor use of  hands Standing Unsupported with Eyes Closed: Able to stand 10 seconds safely Standing Ubsupported with Feet Together: Able to place feet together independently and stand for 1 minute with supervision From Standing, Reach Forward with Outstretched Arm: Can reach forward >5 cm safely (2") From Standing Position, Pick up Object from Floor: Able to pick up shoe safely and easily From Standing Position, Turn to Look Behind Over each Shoulder: Turn sideways only but maintains balance Turn 360 Degrees: Able to turn 360 degrees safely one side only in 4 seconds or less Standing Unsupported, Alternately Place Feet on Step/Stool: Able to stand independently and safely and complete 8 steps in 20 seconds Standing Unsupported, One Foot in Front: Able to plae foot ahead of the other independently and hold 30 seconds Standing on One Leg: Able to lift leg independently and hold equal to or more than 3 seconds Total Score: 45 Static Sitting Balance Static Sitting - Level of Assistance: 5: Stand by assistance Dynamic Sitting Balance Dynamic Sitting - Balance Support: Feet supported Dynamic Sitting - Level of Assistance: 5: Stand by assistance Static Standing Balance Static Standing - Balance Support: No upper extremity supported Static Standing - Level of Assistance: 5: Stand by assistance Dynamic Standing Balance Dynamic Standing - Balance Support: No upper extremity supported Dynamic Standing - Level of Assistance: 5: Stand by assistance Extremity Assessment  RUE Assessment RUE Assessment:  Within Functional Limits (assessed functionally) LUE Assessment LUE Assessment: Within Functional Limits (assessed functionally) RLE Assessment RLE Assessment: Exceptions to [facility] RLE Strength RLE Overall Strength: Other (Comment) (difficult to formally test due to cognition; pt demonstrated generalized weakness in LE) LLE Assessment LLE Assessment: Exceptions to [facility] LLE Strength LLE Overall Strength: Other (Comment) (difficult to formally test due to cognition; pt demonstrated generalized weakness in LE)   See Function Navigator for Current Functional Status.   Refer to Care Plan for Long Term Goals  Recommendations for other services: Neuropsych and Therapeutic Recreation  Pet therapy and Stress management  Discharge Criteria: Patient will be discharged from PT if patient refuses treatment 3 consecutive times without medical reason, if treatment goals not met, if there is a change in medical status, if patient makes no progress towards goals or if patient is discharged from hospital.  The above assessment, treatment plan, treatment alternatives and goals were discussed and mutually agreed upon: by patient  Gwinda Passe, SPT 05/19/2017, 1:00 PM

## 2017-05-19 NOTE — H&P (Signed)
Physical Medicine and Rehabilitation Admission H&P       Chief Complaint  Patient presents with  . Decreased in functional mobility due to stroke with mental status changes and difficulty speaking.     HPI: Mario Mittsis an 56 y.o.male with history of Down's syndrome (resident of a group home), MDD, COPD, who was admitted on 05/14/17 with mental status changes, right sided weakness and difficulty speaking. MRI brain revealed Left MCA infarct affecting lateral and posterior temporal lobe and extending into left parietal lobe.  CTA head with left M2 branch occlusion.  2D echo with EF 55-60% with + bubble study consistent with PFO/ASD and hypermobile interatrial septum. BLE dopplers negative for DVT. Dr. Erlinda Hong recommended DAPT with ASA 325 mg/Plavix 75 mg X 3 months followed by plavix alone for embolic store of unknown etiology. No at good candidate for PFO closure and 30 day cardiac event  Monitor recommended to rule out A fib. Patient with dysphagia with risk for aspiration, cognitive deficits, severe dysarthria, left gaze preference and unsteady gait. CIR recommended due to deficits in mobility and self-care tasks.     Review of Systems  HENT: Negative for hearing loss and tinnitus.   Eyes: Negative for blurred vision and double vision.  Respiratory: Positive for cough and wheezing.   Cardiovascular: Negative for chest pain.  Gastrointestinal: Positive for constipation. Negative for heartburn and nausea.  Genitourinary: Negative for dysuria.  Musculoskeletal: Negative for back pain and myalgias.  Skin: Negative for rash.  Neurological: Positive for speech change and focal weakness.  Psychiatric/Behavioral: Positive for memory loss.          Past Medical History:  Diagnosis Date  . COPD (chronic obstructive pulmonary disease) ([place])   . Hepatitis C   . Hx of cholecystectomy   . Hypothyroidism   . Kidney cysts   . Major depressive disorder    Was treated at  facility in [place] ([facility]) years ago  . Tobacco abuse   . Vitamin D deficiency          Past Surgical History:  Procedure Laterality Date  . LAPAROSCOPIC CHOLECYSTECTOMY            Family History  Problem Relation Age of Onset  . Adopted: Yes  . Diabetes Maternal Grandmother       Social History:  Lives in a group home. Goes to adult day program during the day. Per reports that he has quit smoking 2-3 months ago. His smoking use included Cigarettes. He has a 39.00 pack-year smoking history. He has never used smokeless tobacco. He reports that he does not drink alcohol. Question history of IV drug use in the past.     Allergies: No Known Allergies          Medications Prior to Admission  Medication Sig Dispense Refill  . albuterol (PROVENTIL HFA;VENTOLIN HFA) 108 (90 Base) MCG/ACT inhaler Inhale 2 puffs into the lungs every 6 (six) hours as needed for wheezing or shortness of breath.    Marland Kitchen alendronate (FOSAMAX) 70 MG tablet Take 70 mg by mouth once a week. Fridays    . aspirin 325 MG tablet Take 1 tablet (325 mg total) by mouth daily. 90 tablet 0  . atorvastatin (LIPITOR) 40 MG tablet Take 1 tablet (40 mg total) by mouth daily at 6 PM. 30 tablet 0  . buPROPion (WELLBUTRIN XL) 150 MG 24 hr tablet Take 150 mg by mouth daily.     . calcium carbonate (  OS-CAL) 600 MG TABS tablet Take 600 mg by mouth 2 (two) times daily with a meal.     . cetirizine (ZYRTEC) 10 MG tablet Take 10 mg by mouth daily.    . Cholecalciferol (VITAMIN D3) 50000 units CAPS Take 1 Can by mouth every 7 (seven) days. Thursdays    . citalopram (CELEXA) 40 MG tablet Take 40 mg by mouth daily.     . clonazePAM (KLONOPIN) 0.5 MG tablet Take 0.5 mg by mouth daily. *may also take an additional 0.5mg  tab as needed for anxiety/agitation    . clopidogrel (PLAVIX) 75 MG tablet Take 1 tablet (75 mg total) by mouth daily. 90 tablet 0  . fluticasone (FLONASE) 50 MCG/ACT nasal spray Place 2  sprays into the nose daily as needed for allergies.     . furosemide (LASIX) 40 MG tablet Take 40 mg by mouth daily.    . meloxicam (MOBIC) 15 MG tablet Take 15 mg by mouth daily.    . polyethylene glycol (MIRALAX / GLYCOLAX) packet Take 17 g by mouth 2 (two) times daily. 14 each 0  . potassium chloride (K-DUR) 10 MEQ tablet Take 10 mEq by mouth daily.     . risperiDONE (RISPERDAL) 3 MG tablet Take 3 mg by mouth 2 (two) times daily.    Marland Kitchen senna-docusate (SENOKOT-S) 8.6-50 MG tablet Take 2 tablets by mouth 2 (two) times daily. 60 tablet 0  . traZODone (DESYREL) 50 MG tablet Take 50 mg by mouth at bedtime.       Home: Home Living Family/patient expects to be discharged to:: Group home Available Help at Discharge: Available 24 hours/day Type of Home: Group Home  Lives With: Other (Comment)   Functional History: Prior Function Level of Independence: Needs assistance Gait / Transfers Assistance Needed: Amb w/o AD per pt sister ADL's / Homemaking Assistance Needed: Lives in group home, does own ADL's, bathes/dress/toileting. Has assistance with Meals and homaking. Has chores; 5 days per week goes to school and has jobs there as well Communication / Swallowing Assistance Needed: Pt has always had some garbled speech but sister states that he was able to be understood if you asked him a question a second time. Aphasia  Functional Status:  Mobility: Bed Mobility Overal bed mobility: Needs Assistance Bed Mobility: Supine to Sit, Sit to Supine Supine to sit: Supervision Sit to supine: Supervision General bed mobility comments: impulsive, but generally safe Transfers Overall transfer level: Needs assistance Equipment used: 1 person hand held assist Transfers: Sit to/from Stand Sit to Stand: Supervision Stand pivot transfers: Supervision General transfer comment: pt fairly safe with transfers Ambulation/Gait Ambulation/Gait assistance: Supervision Ambulation Distance (Feet):  100 Feet (x2) Assistive device: None Gait Pattern/deviations: Step-through pattern General Gait Details: generally steady gait, less attension given to R side, but generally negligile Gait velocity interpretation: at or above normal speed for age/gender  ADL: ADL Overall ADL's : Needs assistance/impaired Eating/Feeding: NPO Grooming: Wash/dry hands, Wash/dry face, Standing, Minimal assistance Grooming Details (indicate cue type and reason): Gaze preference to the left. Requires increased vc's to look to right during ADL's  Upper Body Bathing: Minimal assistance, Sitting, Set up Lower Body Bathing: Min guard, Sitting/lateral leans, Sit to/from stand, Cueing for safety Upper Body Dressing : Minimal assistance, Sitting, Cueing for safety Lower Body Dressing: Moderate assistance, Sit to/from stand Toilet Transfer: Ambulation, Comfort height toilet, Minimal assistance (+1 hand held assist initially, then min quard while standing at toilet to urinate) Toileting- Water quality scientist and Hygiene: Min guard, Sit  to/from stand Functional mobility during ADLs: Supervision/safety, Min guard, Cueing for safety (+1 hand held assist with vc's for safety when ambulating in room for ADL's) General ADL Comments: Pt was assessed for OT followed by participation in ADL retrainign session to include toileting, LB dressing, washing hands/face standing at sink and some bathing sitting at EOB. Pt is with noted L gaze preference and difficulty attending to R side as well as difficulty following 1 step commands. Cont to assess if this is related to L MCA Infarct or a combination of this as well as his cognitive deficits.  Cognition: Cognition Overall Cognitive Status: History of cognitive impairments - at baseline Arousal/Alertness: Awake/alert Orientation Level: Oriented to person, Oriented to situation, Oriented to place Attention: Focused, Sustained Focused Attention: Appears intact Sustained Attention:  Impaired Sustained Attention Impairment: Verbal basic, Functional basic Awareness: Impaired Awareness Impairment: Intellectual impairment Problem Solving: Impaired Problem Solving Impairment: Verbal basic, Functional basic Safety/Judgment: Impaired Cognition Arousal/Alertness: Awake/alert Behavior During Therapy: Flat affect Overall Cognitive Status: History of cognitive impairments - at baseline General Comments: When asked if he had any pain, pt was unintelligible but when sister asked him again, he indicated that his head hurt. He was asked "A lot or a little?" and shown hand gestures at the same time and did not respond. His sister then asked "From 1-10, how bad is it?" and Pt stated "10" clearly. Difficult to assess due to: Impaired communication  Physical Exam: Blood pressure 113/62, pulse 76, temperature 98.3 F (36.8 C), temperature source Oral, resp. rate 18, height 5\' 8"  (1.727 m), weight 100 kg (220 lb 7.4 oz), SpO2 98 %. Physical Exam  Nursing note and vitals reviewed. Constitutional: He appears well-developed and well-nourished.  HENT:  Head: Normocephalic and atraumatic.  Mouth/Throat: Oropharynx is clear and moist.  Eyes: Pupils are equal, round, and reactive to light. Conjunctivae and EOM are normal.  Neck: Normal range of motion. Neck supple.  Cardiovascular: Normal rate and regular rhythm.   Respiratory: Effort normal. Stridor present. He has wheezes.  Audible wheezing noted.  Occasional cough due to difficulty with oral secretions (unable to lie flat).   GI: Soft. Bowel sounds are normal.  Musculoskeletal: He exhibits no edema or tenderness.  Neurological: He is alert.  Oriented to self and place " Hospital". Severe dysarthria with one word answers. Right visual deficits with right inattention. Able to follow simple one step motor commands. Slow to process.   Skin: Skin is warm and dry.  Psychiatric: His affect is blunt. His speech is delayed and slurred. He is  slowed. Cognition and memory are impaired.    Lab Results Last 48 Hours       Results for orders placed or performed during the hospital encounter of 05/14/17 (from the past 48 hour(s))  Glucose, capillary     Status: None   Collection Time: 05/17/17  4:11 PM  Result Value Ref Range   Glucose-Capillary 96 65 - 99 mg/dL     Imaging Results (Last 48 hours)  No results found.       Medical Problem List and Plan: 1.  Functional, language, and swallowing deficits secondary to left MCA infarct             -admitted to inpatient rehab  -team conference later today to review performance and goals 2.  DVT Prophylaxis/Anticoagulation: Pharmaceutical: Lovenox 3. Pain Management: N/A 4. Mood: LCSW to follow for evaluation and support.  5. Neuropsych: This patient is not capable of making decisions on  his own behalf. 6. Skin/Wound Care: routine pressure relief measures.  7. Fluids/Electrolytes/Nutrition: Monitor I/O. Labs pending 8. COPD/Asthma: Will add albuterol nebs prn. Sister reports that he quit smoking a couple of months ago "with use of medication" 9. Major depressive disorder: On Wellbutrin XL, Celexa, Risperdal and Klonopin 10.  H/o Chronic Hep C with hepatitis/Thrombocytopenia: Recheck CBC in am. Discontinue Lovenox if platelets drop further.   11. Constipation: Start bowel program.  12. Dysphagia: Full supervision for safety. Dysphagia 1, thin liquids.    Post Admission Physician Evaluation: 1. Functional deficits secondary  to left MCA infarct. 2. Patient is admitted to receive collaborative, interdisciplinary care between the physiatrist, rehab nursing staff, and therapy team. 3. Patient's level of medical complexity and substantial therapy needs in context of that medical necessity cannot be provided at a lesser intensity of care such as a SNF. 4. Patient has experienced substantial functional loss from his/her baseline which was documented above under the  "Functional History" and "Functional Status" headings.  Judging by the patient's diagnosis, physical exam, and functional history, the patient has potential for functional progress which will result in measurable gains while on inpatient rehab.  These gains will be of substantial and practical use upon discharge  in facilitating mobility and self-care at the household level. 5. Physiatrist will provide 24 hour management of medical needs as well as oversight of the therapy plan/treatment and provide guidance as appropriate regarding the interaction of the two. 6. The Preadmission Screening has been reviewed and patient status is unchanged unless otherwise stated above. 7. 24 hour rehab nursing will assist with bladder management, bowel management, safety, skin/wound care, disease management, medication administration, pain management and patient education  and help integrate therapy concepts, techniques,education, etc. 8. PT will assess and treat for/with: Lower extremity strength, range of motion, stamina, balance, functional mobility, safety, adaptive techniques and equipment, NMR< .   Goals are: mod I. 9. OT will assess and treat for/with: ADL's, functional mobility, safety, upper extremity strength, adaptive techniques and equipment, NMR, cognitive-pereptual rx.   Goals are: mod I to supervision. Therapy may proceed with showering this patient. 10. SLP will assess and treat for/with: speech, language, cognition, swallowing.  Goals are: supervision. 11. Case Management and Social Worker will assess and treat for psychological issues and discharge planning. 12. Team conference will be held weekly to assess progress toward goals and to determine barriers to discharge. 13. Patient will receive at least 3 hours of therapy per day at least 5 days per week. 14. ELOS: 6-8 days       15. Prognosis:  excellent     Meredith Staggers, MD, [facility] 05/18/2017  Bary Leriche, Hershal Coria 05/18/2017

## 2017-05-19 NOTE — Progress Notes (Signed)
Patient information reviewed and entered into eRehab system by Tatianna Ibbotson, RN, CRRN, PPS Coordinator.  Information including medical coding and functional independence measure will be reviewed and updated through discharge.     Per nursing patient was given "Data Collection Information Summary for Patients in Inpatient Rehabilitation Facilities with attached "Privacy Act Statement-Health Care Records" upon admission.  

## 2017-05-19 NOTE — Evaluation (Signed)
Speech Language Pathology Assessment and Plan  Patient Details  Name: Mario Liu MRN: [id number] Date of Birth: 01-Feb-1961  SLP Diagnosis: Dysarthria;Dysphagia;Cognitive Impairments (baseline cognitive impairments)  Rehab Potential: Good ELOS: 9/11 1 week    Today's Date: 05/19/2017 SLP Individual Time: 4270-6237 SLP Individual Time Calculation (min): 65 min   Problem List:  Patient Active Problem List   Diagnosis Date Noted  . Major depressive disorder   . Hepatitis C antibody test positive 05/16/2017  . Acute ischemic left MCA stroke (Mario Liu)   . Down's syndrome   . PFO with atrial septal aneurysm    Past Medical History:  Past Medical History:  Diagnosis Date  . COPD (chronic obstructive pulmonary disease) ([place])   . Hepatitis C   . Hx of cholecystectomy   . Hypothyroidism   . Kidney cysts   . Major depressive disorder    Was treated at facility in [place] ([facility]) years ago  . Tobacco abuse   . Vitamin D deficiency    Past Surgical History:  Past Surgical History:  Procedure Laterality Date  . LAPAROSCOPIC CHOLECYSTECTOMY      Assessment / Plan / Recommendation Clinical Impression Mario Liu an 56 y.o.male with history of Down's syndrome (resident of a group home), MDD, COPD, who was admitted on 05/14/17 with mental status changes, right sided weakness and difficulty speaking. MRI brain revealed Left MCA infarct affecting lateral and posterior temporal lobe and extending into left parietal lobe. CTA head with left M2 branch occlusion. 2D echo with EF 55-60% with + bubble study consistent with PFO/ASD and hypermobile interatrial septum. BLE dopplers negative for DVT. Dr. Erlinda Liu recommended DAPT with ASA 325 mg/Plavix 75 mg X 3 months followed by plavix alone for embolic store of unknown etiology. No at good candidate for PFO closure and 30 day cardiac event Monitor recommended to rule out A fib. Patient with dysphagia with risk for aspiration, cognitive deficits, severe  dysarthria, left gaze preference and unsteady gait. MBS completed on 8/31 results indicate oral control due to weakness/senstation impairment, recommend dysphagia 2 and thin liquids with piror and post oral care to reduce risk of aspiration.   Patient was evaluated on 05/19/2017 to assess cognitive, communication and swallow abilities. Patient presents with baseline moderate cognitive and intelligibility impairments per family member report via medical record. Patient demonstrates severe impairments in functional problem solving, communicating wants/needs, speech intelligibility at word/phrase level with moderate dysarthria and a primary expressive impairment. Patient presents with moderate oral dysphagia demonstrating impairment in oral control and reduce weakness/sensation on right buccal and lingual resulting in premature spillage, weak labial seal resulting in anterior spillage and mild lingual residue/buccal pocketing on right side with thicker textured foods (dys 2 and regular), which is further supported by MBS results 05/15/2017 indicating initermitent aspiration of all liquids including secretions. Pt demonstrates overall WFL oral motor movements, with mild right side weakness. Pt required moderate assistance utilizing suction toothbrush for oral care piror and post PO consumption. Pt demonstrated overt s/s aspiration post PO consumption of regular textured foods and thin liquids via straw with impulsive behavior of multiple sips. Recommend to continue current diet, dysphagia 2 and thin liquids with piror and post oral care to reduce risk of aspiration. Patient will benefit from skilled ST while inpatient to maximize functional independence and reduce burden of care for discharge with 24 hour assistance.  Skilled Therapeutic Interventions            SLP Assessment  Patient will need skilled  Speech Lanaguage Pathology Services during CIR admission    Recommendations  SLP Diet Recommendations: Dysphagia  1 (Puree);Thin Liquid Administration via: Cup Medication Administration: Whole meds with puree Supervision: Full supervision/cueing for compensatory strategies Compensations: Slow rate;Small sips/bites;Multiple dry swallows after each bite/sip;Minimize environmental distractions Postural Changes and/or Swallow Maneuvers: Seated upright 90 degrees Oral Care Recommendations: Oral care before and after PO Patient destination: Assisted Living (group home) Follow up Recommendations: 24 hour supervision/assistance Equipment Recommended: None recommended by SLP    SLP Frequency 3 to 5 out of 7 days   SLP Duration  SLP Intensity  SLP Treatment/Interventions 9/11 1 week  Minumum of 1-2 x/day, 30 to 90 minutes  Cognitive remediation/compensation;Cueing hierarchy;Dysphagia/aspiration precaution training;Functional tasks;Speech/Language facilitation    Pain Pain Assessment Pain Assessment: No/denies pain  Prior Functioning Cognitive/Linguistic Baseline: Baseline deficits Baseline deficit details: Down syndrome, able to complete basic functional/verbal tasks, lives in a group home with assisst for bathing, cooking etc.  Type of Home: Group Home  Lives With:  (group home) Available Help at Discharge: Available 24 hours/day Vocation: Other (comment) (goes to "school.")  Function:  Eating Eating   Modified Consistency Diet: Yes Eating Assist Level: Swallowing techniques: self managed;Supervision or verbal cues;Set up assist for (follow was strategies, slow rate and double swallow max cues)   Eating Set Up Assist For: Opening containers       Cognition Comprehension Comprehension assist level: Understands basic 50 - 74% of the time/ requires cueing 25 - 49% of the time  Expression   Expression assist level: Expresses basic 25 - 49% of the time/requires cueing 50 - 75% of the time. Uses single words/gestures.  Social Interaction Social Interaction assist level: Interacts appropriately  90% of the time - Needs monitoring or encouragement for participation or interaction.  Problem Solving Problem solving assist level: Solves basic less than 25% of the time - needs direction nearly all the time or does not effectively solve problems and may need a restraint for safety  Memory Memory assist level: Recognizes or recalls less than 25% of the time/requires cueing greater than 75% of the time   Short Term Goals: Week 1: SLP Short Term Goal 1 (Week 1): Pt will consume current diet without overt s/s aspiration with Max A verbal cues for use of swallow compensatory strategies.  SLP Short Term Goal 2 (Week 1): Pt will consume trials of dsyphagia 2 textured foods with Mod verbal cues to reduce lingual residue and no overt s/s aspiration in order to demonstrate readiness for diet advancement. SLP Short Term Goal 3 (Week 1): Pt will complete basic, familar tasks with Max A mulimodal cues for functional problem solving.  SLP Short Term Goal 4 (Week 1): Pt will express wants/needs via multimodal communciation with Mod A verbal cues. SLP Short Term Goal 5 (Week 1): Pt will demonstrate speech intellgibility at the word level with 60% accuarcy and Max A multimodal cues for use of strategies.  Refer to Care Plan for Long Term Goals  Recommendations for other services: None   Discharge Criteria: Patient will be discharged from SLP if patient refuses treatment 3 consecutive times without medical reason, if treatment goals not met, if there is a change in medical status, if patient makes no progress towards goals or if patient is discharged from hospital.  The above assessment, treatment plan, treatment alternatives and goals were discussed and mutually agreed upon: by patient  Miguel Christiana  [facility] 05/19/2017, 3:55 PM

## 2017-05-19 NOTE — Evaluation (Signed)
Occupational Therapy Assessment and Plan  Patient Details  Name: Mario Liu MRN: [id number] Date of Birth: 1960-10-01  OT Diagnosis: cognitive deficits and hemiplegia affecting dominant side Rehab Potential: Rehab Potential (ACUTE ONLY): Good ELOS: ~7-10  days   Today's Date: 05/19/2017 OT Individual Time:  -  11:00-12:00 (60 min)       Problem List:  Patient Active Problem List   Diagnosis Date Noted  . Major depressive disorder   . Hepatitis C antibody test positive 05/16/2017  . Acute ischemic left MCA stroke ([place])   . Down's syndrome   . PFO with atrial septal aneurysm     Past Medical History:  Past Medical History:  Diagnosis Date  . COPD (chronic obstructive pulmonary disease) ([place])   . Hepatitis C   . Hx of cholecystectomy   . Hypothyroidism   . Kidney cysts   . Major depressive disorder    Was treated at facility in [place] ([facility]) years ago  . Tobacco abuse   . Vitamin D deficiency    Past Surgical History:  Past Surgical History:  Procedure Laterality Date  . LAPAROSCOPIC CHOLECYSTECTOMY      Assessment & Plan Clinical Impression: Patient is a 56 y.o. year old male with history of Down's syndrome (resident of a group home), MDD, COPD, who was admitted on 05/14/17 with mental status changes, right sided weakness and difficulty speaking. MRI brain revealed Left MCA infarct affecting lateral and posterior temporal lobe and extending into left parietal lobe.  CTA head with left M2 branch occlusion. 2D echo with EF 55-60% with + bubble study consistent with PFO/ASD and hypermobile interatrial septum. BLE dopplers negative for DVT. Dr. Erlinda Hong recommended DAPT with ASA 325 mg/Plavix 75 mg X 3 months followed by plavix alone for embolic store of unknown etiology. No at good candidate for PFO closure and 30 day cardiac event Monitor recommended to rule out A fib. Patient with dysphagia with risk for aspiration, cognitive deficits, severe dysarthria, left gaze preference  and unsteady gait. Patient transferred to CIR on 05/18/2017 .    Patient currently requires min with basic self-care skills and basic mobility  secondary to muscle weakness, decreased cardiorespiratoy endurance, impaired timing and sequencing and decreased coordination, decreased visual acuity and decreased visual motor skills, decreased attention to right, decreased attention, decreased awareness, decreased problem solving, decreased safety awareness and decreased memory and decreased standing balance, decreased postural control, hemiplegia and difficulty maintaining precautions.  Prior to hospitalization, patient could complete ADL with mod I to supervision.  Patient will benefit from skilled intervention to decrease level of assist with basic self-care skills and increase independence with basic self-care skills prior to discharge home with care partner.  Anticipate patient will require 24 hour supervision and follow up home health and follow up outpatient.  OT - End of Session Endurance Deficit: Yes OT Assessment Rehab Potential (ACUTE ONLY): Good OT Patient demonstrates impairments in the following area(s): Behavior;Balance;Safety;Cognition;Edema;Endurance;Motor;Nutrition;Pain OT Basic ADL's Functional Problem(s): Grooming;Bathing;Dressing;Toileting OT Transfers Functional Problem(s): Toilet;Tub/Shower OT Additional Impairment(s): None OT Plan OT Intensity: Minimum of 1-2 x/day, 45 to 90 minutes OT Frequency: 5 out of 7 days OT Duration/Estimated Length of Stay: ~7-10  days OT Treatment/Interventions: Balance/vestibular training;Discharge planning;Functional electrical stimulation;Pain management;Self Care/advanced ADL retraining;Therapeutic Activities;UE/LE Coordination activities;Cognitive remediation/compensation;Disease mangement/prevention;Functional mobility training;Patient/family education;Skin care/wound managment;Therapeutic Exercise;Visual/perceptual remediation/compensation;UE/LE  Strength taining/ROM;Splinting/orthotics;Psychosocial support;Neuromuscular re-education;DME/adaptive equipment instruction;Community reintegration OT Self Feeding Anticipated Outcome(s): supervision OT Basic Self-Care Anticipated Outcome(s): supervision OT Toileting Anticipated Outcome(s): supervision OT Bathroom Transfers Anticipated  Outcome(s): supervision OT Recommendation Patient destination: Home Follow Up Recommendations: Home health OT Equipment Recommended: None recommended by OT   Skilled Therapeutic Intervention Ot eval initiated with Ot goals, purpose and role discussed with pt. Self care retraining including functional ambulation without AD around the room with min A, showering sit to stand, dressing in standing (with one limb stance with min to mod A to maintain an [facility] and visual attention to the right. PT required mod cues for attention to the right in his environment throughout session. Pt with difficulty with tying shoes and manipulating grooming items with right hand and requesting help. PT with labored breathing throughout session with activity requiring rest breaks as needed and elevated HR to 120s but able to return to 100 with rest.   Left in w/c with alarm seat belt with call bell in prep for supervised lunch.  OT Evaluation Precautions/Restrictions  Precautions Precautions: Fall Restrictions Weight Bearing Restrictions: No General Chart Reviewed: Yes Family/Caregiver Present: No    Pain Pain Assessment Pain Assessment: No/denies pain Home Living/Prior Functioning Home Living Family/patient expects to be discharged to:: Group home Available Help at Discharge: Available 24 hours/day Type of Home: Group Home Home Access: Stairs to enter Entrance Stairs-Number of Steps: Pt unable to report how many stairs to get in  Entrance Stairs-Rails: Can reach both  Lives With:  (group home) Prior Function Level of Independence: Needs assistance with ADLs Vocation:  Other (comment) (goes to "school.") ADL ADL ADL Comments: see functional navigator Vision Baseline Vision/History: No visual deficits Visual Fields: Right visual field deficit Perception  Perception: Impaired Inattention/Neglect: Does not attend to right visual field Praxis Praxis: Impaired Cognition Overall Cognitive Status: History of cognitive impairments - at baseline Arousal/Alertness: Awake/alert Orientation Level: Person;Place;Situation Person: Oriented Place: Oriented Situation: Disoriented Year:  (didnt know) Month: October Day of Week: Incorrect (tues) Memory: Impaired Memory Impairment: Storage deficit;Retrieval deficit;Decreased recall of new information;Decreased short term memory Decreased Short Term Memory: Verbal basic;Functional basic Immediate Memory Recall:  (0/3) Memory Recall:  (0/3) Attention: Focused;Sustained Focused Attention: Appears intact Sustained Attention: Impaired Sustained Attention Impairment: Verbal basic;Functional basic Awareness: Impaired Awareness Impairment: Intellectual impairment Problem Solving: Impaired Problem Solving Impairment: Verbal basic;Functional basic Safety/Judgment: Impaired Sensation Sensation Light Touch: Appears Intact Proprioception: Appears Intact Coordination Gross Motor Movements are Fluid and Coordinated: Yes Finger Nose Finger Test: delayed and decr coordination on the right Motor  Motor Motor: Hemiplegia Mobility  Bed Mobility Bed Mobility: Rolling Right;Rolling Left;Supine to Sit;Sit to Supine Rolling Right: 5: Supervision Rolling Left: 5: Supervision Transfers Transfers: Sit to Stand;Stand to Sit Sit to Stand: 5: Supervision Stand to Sit: 5: Supervision  Trunk/Postural Assessment  Cervical Assessment Cervical Assessment: Within Functional Limits Thoracic Assessment Thoracic Assessment: Within Functional Limits Lumbar Assessment Lumbar Assessment: Within Functional Limits Postural  Control Postural Control: Within Functional Limits  Balance  static standing balance with supervision Dynamic standing balance min A  Functional ambulation with tasks min A  Extremity/Trunk Assessment RUE Assessment RUE Assessment:  (decr FMC and grossly 4/5) LUE Assessment LUE Assessment: Within Functional Limits   See Function Navigator for Current Functional Status.   Refer to Care Plan for Long Term Goals  Recommendations for other services: None    Discharge Criteria: Patient will be discharged from OT if patient refuses treatment 3 consecutive times without medical reason, if treatment goals not met, if there is a change in medical status, if patient makes no progress towards goals or if patient is discharged from hospital.  The  above assessment, treatment plan, treatment alternatives and goals were discussed and mutually agreed upon: by patient  Nicoletta Ba 05/19/2017, 4:33 PM

## 2017-05-20 ENCOUNTER — Inpatient Hospital Stay (HOSPITAL_COMMUNITY): Payer: Medicare Other

## 2017-05-20 ENCOUNTER — Inpatient Hospital Stay (HOSPITAL_COMMUNITY): Payer: Medicare Other | Admitting: Speech Pathology

## 2017-05-20 ENCOUNTER — Inpatient Hospital Stay (HOSPITAL_COMMUNITY): Payer: Medicare Other | Admitting: *Deleted

## 2017-05-20 ENCOUNTER — Inpatient Hospital Stay (HOSPITAL_COMMUNITY): Payer: Medicare Other | Admitting: Physical Therapy

## 2017-05-20 DIAGNOSIS — R7989 Other specified abnormal findings of blood chemistry: Secondary | ICD-10-CM

## 2017-05-20 NOTE — Progress Notes (Signed)
Physical Therapy Session Note  Patient Details  Name: Mario Liu MRN: [id number] Date of Birth: 1960-12-31  Today's Date: 05/20/2017 PT Individual Time: 0800-0900 PT Individual Time Calculation (min): 60 min   Short Term Goals: Week 1:  PT Short Term Goal 1 (Week 1): = LTG due to length of stay   Skilled Therapeutic Interventions/Progress Updates:    Pt received lying in bed asleep, agreeable to therapy. Pt supine>sit with supervision. Donned pants & shoes with supervision and cues for sequencing and differentiating left & right shoe. Sit<>stand with supervision and cues for safety. Pt washed hands with supervision, cues for turning on water. Pt ambulated 150 ft to gym with no AD and min guard for safety. Pt performed static standing balance on foam with no UE support while doing card matching. Max cues initially for matching cards, progressing to min cues to complete task. Pt ambulated 150 ft to day room as above. Pt kicked soccer ball back and forth with min guard for safety to work on anticipatory postural response. Pt required several cues to continue participation. Switched to playing baseball with beach ball per patient preference for baseball. Pt ambulated 125 ft back to room as above. Pt supervision to toilet while standing but soiled clothing in process. Pt required supervision to change pants & shirt, max cues for sequencing. Max assist to don brief. Pt washed hands with supervision as above. Pt left sitting up in wheelchair, all needs in reach, hand off to recreational therapy.   Therapy Documentation Precautions:  Precautions Precautions: Fall Restrictions Weight Bearing Restrictions: No   See Function Navigator for Current Functional Status.   Therapy/Group: Individual Therapy  Gwinda Passe, SPT 05/20/2017, 9:41 AM

## 2017-05-20 NOTE — Evaluation (Signed)
Recreational Therapy Assessment and Plan  Patient Details  Name: Mario Liu MRN: [id number] Date of Birth: [birth year] Today's Date: 05/20/2017  Rehab Potential: Good  ELOS: 7 days   Assessment  Problem List:      Patient Active Problem List   Diagnosis Date Noted  . Major depressive disorder   . Hepatitis C antibody test positive 05/16/2017  . Acute ischemic left MCA stroke ([place])   . Down's syndrome   . PFO with atrial septal aneurysm     Past Medical History:      Past Medical History:  Diagnosis Date  . COPD (chronic obstructive pulmonary disease) ([place])   . Hepatitis C   . Hx of cholecystectomy   . Hypothyroidism   . Kidney cysts   . Major depressive disorder    Was treated at facility in [place] ([facility]) years ago  . Tobacco abuse   . Vitamin D deficiency    Past Surgical History:       Past Surgical History:  Procedure Laterality Date  . LAPAROSCOPIC CHOLECYSTECTOMY      Assessment & Plan Clinical Impression: Patient is a 56 y.o. year old male with history of Down's syndrome (resident of a group home), MDD, COPD, who was admitted on 05/14/17 with mental status changes, right sided weakness and difficulty speaking. MRI brain revealed Left MCA infarct affecting lateral and posterior temporal lobe and extending into left parietal lobe.  CTA head with left M2 branch occlusion. 2D echo with EF 55-60% with + bubble study consistent with PFO/ASD and hypermobile interatrial septum. BLE dopplers negative for DVT. Dr. Erlinda Hong recommended DAPT with ASA 325 mg/Plavix 75 mg X 3 months followed by plavix alone for embolic store of unknown etiology. No at good candidate for PFO closure and 30 day cardiac event Monitor recommended to rule out A fib. Patient with dysphagia with risk for aspiration, cognitive deficits, severe dysarthria, left gaze preference and unsteady gait. Patient transferred to CIR on 05/18/2017.   \ Pt presents with decreased activity  tolerance, decreased functional mobility, decreased balance decreased vision, right inattention, decreased attention, decreased awareness, decreased problem solving, decreased safety, decreased memory difficulty maintaining precautions Limiting pt's independence with leisure/community pursuits.    Leisure History/Participation Premorbid leisure interest/current participation: Sports - Baseball;Sports - Basketball;Sports - Golf;Nature - Fishing;Community - Doctor, hospital - Production designer, theatre/television/film (bowling) Expression Interests: Music (Comment);Dance;Singing;Play instrument (Comment) (rock n roll; play the drums) Other Leisure Interests: Videogames;Television;Movies;Computer;Cooking/Baking (batman; cook chicken) Leisure Participation Style: With Family/Friends Psychosocial / Spiritual Spiritual Interests: Church Patient agreeable to Pet Therapy: Yes Does patient have pets?: No Social interaction - Mood/Behavior: Cooperative Engineer, drilling for Education?: Yes Recreational Therapy Orientation Orientation -Reviewed with patient: Available activity resources Strengths/Weaknesses Patient Strengths/Abilities: Willingness to participate Patient weaknesses: Physical limitations TR Patient demonstrates impairments in the following area(s): Endurance;Motor;Safety  Plan Min 1 TR session/group >25 minutes during LOS  Recommendations for other services: None   Discharge Criteria: Patient will be discharged from TR if patient refuses treatment 3 consecutive times without medical reason.  If treatment goals not met, if there is a change in medical status, if patient makes no progress towards goals or if patient is discharged from hospital.  The above assessment, treatment plan, treatment alternatives and goals were discussed and mutually agreed upon: by patient  [place] 05/20/2017, 9:17 AM

## 2017-05-20 NOTE — Progress Notes (Signed)
Occupational Therapy Session Note  Patient Details  Name: Leverett Camplin MRN: [id number] Date of Birth: September 03, 1961  Today's Date: 05/20/2017 OT Individual Time: 5974-1638 OT Individual Time Calculation (min): 75 min    Short Term Goals: Week 1:  OT Short Term Goal 1 (Week 1): LTG=STG  Skilled Therapeutic Interventions/Progress Updates:    Pt resting in w/c upon arrival.  Pt initially engaged in BADL retraining including bathing at shower level and dressing with sit<>stand from w/c.  Pt initiated all bathing/dressing tasks when presented with supplies and clothing. Pt required assistance with fastening shoes. Pt amb without AD to gym and engaged in Dyn[employer]ision tasks to assess possible visual deficits and R inattention.  Pt noted with slight R inattention but tracked and scanned in all quadrants.  Pt returned to room and remained in w/c with all needs within reach.   Therapy Documentation Precautions:  Precautions Precautions: Fall Restrictions Weight Bearing Restrictions: No   Pain:  Pt denies pain  See Function Navigator for Current Functional Status.   Therapy/Group: Individual Therapy  [name] 05/20/2017, 12:12 PM

## 2017-05-20 NOTE — Progress Notes (Signed)
[place] PHYSICAL MEDICINE & REHABILITATION     PROGRESS NOTE    Subjective/Complaints: Had a good night. Still wants to go home  ROS: Limited due to cognitive/behavioral   Objective: Vital Signs: Blood pressure 123/73, pulse 80, temperature 98.2 F (36.8 C), temperature source Oral, resp. rate 17, height 5\' 5"  (1.651 m), weight 96.2 kg (212 lb), SpO2 99 %. No results found.  Recent Labs  05/19/17 0923  WBC 5.9  HGB 13.4  HCT 40.5  PLT 113*    Recent Labs  05/19/17 0923  NA 136  K 3.9  CL 107  GLUCOSE 171*  BUN 12  CREATININE 1.02  CALCIUM 8.7*   CBG (last 3)   Recent Labs  05/17/17 1611  GLUCAP 96    Wt Readings from Last 3 Encounters:  05/19/17 96.2 kg (212 lb)  05/14/17 100 kg (220 lb 7.4 oz)  05/14/17 100.3 kg (221 lb 3.2 oz)    Physical Exam:  Constitutional: He appears well-developedand well-nourished.  HENT:  Head: Normocephalicand atraumatic.  Mouth/Throat: Oropharynx is clear and moist.  Eyes: Pupils are equal, round, and reactive to light. Conjunctivaeand EOMare normal.  Neck: Normal range of motion. Neck supple.  Cardiovascular: RRR without murmur. No JVD   Respiratory: a few scattered wheezes. No distress  GI: Soft. Bowel sounds are normal.  Musculoskeletal: He exhibits no edemaor tenderness.  Neurological: He is alert.  Oriented to self and hospital. Moves all 4's. Decreased standing balance.  Skin: Skin is warmand dry.  Psychiatric: flat  Assessment/Plan: 1. Functional deficits secondary to left MCA infarct which require 3+ hours per day of interdisciplinary therapy in a comprehensive inpatient rehab setting. Physiatrist is providing close team supervision and 24 hour management of active medical problems listed below. Physiatrist and rehab team continue to assess barriers to discharge/monitor patient progress toward functional and medical goals.  Function:  Bathing Bathing position   Position: Shower  Bathing parts  Body parts bathed by patient: Right arm, Left arm, Chest, Abdomen, Front perineal area, Right upper leg, Left upper leg Body parts bathed by helper: Buttocks, Right lower leg, Left lower leg, Back  Bathing assist Assist Level: Touching or steadying assistance(Pt > 75%)      Upper Body Dressing/Undressing Upper body dressing   What is the patient wearing?: Pull over shirt/dress     Pull over shirt/dress - Perfomed by patient: Thread/unthread right sleeve, Thread/unthread left sleeve, Put head through opening          Upper body assist Assist Level: Supervision or verbal cues      Lower Body Dressing/Undressing Lower body dressing   What is the patient wearing?: Pants, Underwear, Socks, Shoes Underwear - Performed by patient: Thread/unthread right underwear leg, Thread/unthread left underwear leg, Pull underwear up/down   Pants- Performed by patient: Thread/unthread right pants leg, Thread/unthread left pants leg, Pull pants up/down       Socks - Performed by patient: Don/doff right sock Socks - Performed by helper: Don/doff left sock Shoes - Performed by patient: Don/doff right shoe, Don/doff left shoe Shoes - Performed by helper: Fasten right, Fasten left          Lower body assist Assist for lower body dressing: Touching or steadying assistance (Pt > 75%)      Toileting Toileting   Toileting steps completed by patient: Adjust clothing prior to toileting, Performs perineal hygiene, Adjust clothing after toileting      Toileting assist Assist level: Touching or steadying assistance (Pt.75%)  Transfers Chair/bed transfer     Chair/bed transfer assist level: Supervision or verbal cues       Locomotion Ambulation     Max distance: 200 Assist level: Touching or steadying assistance (Pt > 75%)   Wheelchair          Cognition Comprehension Comprehension assist level: Understands basic 50 - 74% of the time/ requires cueing 25 - 49% of the time  Expression  Expression assist level: Expresses basic 25 - 49% of the time/requires cueing 50 - 75% of the time. Uses single words/gestures.  Social Interaction Social Interaction assist level: Interacts appropriately 90% of the time - Needs monitoring or encouragement for participation or interaction.  Problem Solving Problem solving assist level: Solves basic less than 25% of the time - needs direction nearly all the time or does not effectively solve problems and may need a restraint for safety  Memory Memory assist level: Recognizes or recalls less than 25% of the time/requires cueing greater than 75% of the time   Medical Problem List and Plan: 1. Functional, language, and swallowing deficitssecondary to left MCA infarct -continue therapies 2. DVT Prophylaxis/Anticoagulation: Pharmaceutical: Lovenox 3. Pain Management: N/A 4. Mood: LCSW to follow for evaluation and support.  5. Neuropsych: This patient is notcapable of making decisions on hisown behalf. 6. Skin/Wound Care: routine pressure relief measures.  7. Fluids/Electrolytes/Nutrition: Monitor I/O. Labs reviewed. Hemolysis, some elevation of Cr. Re-check tomorrow  -encourage fluids 8. COPD/Asthma: Will add albuterol nebs prn. Sister reports that he quit smoking a couple of months ago "with use of medication" 9. Major depressive disorder: On Wellbutrin XL, Celexa, Risperdal and Klonopin 10. H/o Chronic Hep C with hepatitis/Thrombocytopenia:   -platelets 113k yesterday. Sl decrease  -recheck tomorrow, continue lovenox for now 11. Constipation: Start bowel program.  12. Dysphagia: Full supervision for safety. Dysphagia 1, thin liquids.   LOS (Days) 2 A [place] T, MD 05/20/2017 9:12 AM

## 2017-05-20 NOTE — Progress Notes (Signed)
Speech Language Pathology Daily Session Note  Patient Details  Name: Mario Liu MRN: [id number] Date of Birth: 11-16-1960  Today's Date: 05/20/2017 SLP Individual Time: 1100-1200 SLP Individual Time Calculation (min): 60 min  Short Term Goals: Week 1: SLP Short Term Goal 1 (Week 1): Pt will consume current diet without overt s/s aspiration with Max A verbal cues for use of swallow compensatory strategies.  SLP Short Term Goal 2 (Week 1): Pt will consume trials of dsyphagia 2 textured foods with Mod verbal cues to reduce lingual residue and no overt s/s aspiration in order to demonstrate readiness for diet advancement. SLP Short Term Goal 3 (Week 1): Pt will complete basic, familar tasks with Max A mulimodal cues for functional problem solving.  SLP Short Term Goal 4 (Week 1): Pt will express wants/needs via multimodal communciation with Mod A verbal cues. SLP Short Term Goal 5 (Week 1): Pt will demonstrate speech intellgibility at the word level with 60% accuarcy and Max A multimodal cues for use of strategies.  Skilled Therapeutic Interventions: Skilled treatment session focused on dysphagia and cognition goals. SLP facilitated session by providing skilled observation of pt consuming thin water. Pt required hand over hand cues to take 1 to 2 sips at a time. Pt with no overt s/s of aspiration with controlled sips. However when pt chugged multiple sips pt with 1 throat clear. Pt also given trials of dysphagia 2 with hand over hand required to control bolus size. Pt with cough x 1 when consuming and more than a reasonable amount of time to effectively masticate bolus. Mild widespread oral residue was left after swallow. SLP further facilitated session by providing Max A cues to sort pegs by color and Max A cues to achieve ~ 25% intelligibility at the word level. Pt was left upright in wheelchair, safety belt donned and all needs within reach. Continue per current plan of care.       Function:  Eating Eating   Modified Consistency Diet: No Eating Assist Level: Swallowing techniques: self managed;Supervision or verbal cues           Cognition Comprehension Comprehension assist level: Understands basic 50 - 74% of the time/ requires cueing 25 - 49% of the time  Expression   Expression assist level: Expresses basic 25 - 49% of the time/requires cueing 50 - 75% of the time. Uses single words/gestures.  Social Interaction Social Interaction assist level: Interacts appropriately 90% of the time - Needs monitoring or encouragement for participation or interaction.  Problem Solving Problem solving assist level: Solves basic less than 25% of the time - needs direction nearly all the time or does not effectively solve problems and may need a restraint for safety  Memory Memory assist level: Recognizes or recalls less than 25% of the time/requires cueing greater than 75% of the time    Pain    Therapy/Group: Individual Therapy  [name] 05/20/2017, 2:01 PM

## 2017-05-20 NOTE — Progress Notes (Signed)
Occupational Therapy Note  Patient Details  Name: Mario Liu MRN: [id number] Date of Birth: [birth year]  Today's Date: 05/20/2017 OT Individual Time: 1330-1400 OT Individual Time Calculation (min): 30 min   PT denies pain Individual Therapy  Pt resting in w/c upon arrival with sister present.  Pt's sister stated that she noted pt's speech intelligibility was improving and was at approx 75%.  Sister also noted that pt had difficulty with objects on his R.  Assured sister that we were aware of pt's current deficits and were addressing these areas.  Pt engaged in grooming tasks including shaving.  Pt typically use electric razor and required min A to use safety razor.  Pt remained in w/c with chair alarm in place and all needs within reach.    Leotis Shames [facility] 05/20/2017, 3:10 PM

## 2017-05-21 ENCOUNTER — Inpatient Hospital Stay (HOSPITAL_COMMUNITY): Payer: Medicare Other

## 2017-05-21 ENCOUNTER — Inpatient Hospital Stay (HOSPITAL_COMMUNITY): Payer: Medicare Other | Admitting: Physical Therapy

## 2017-05-21 ENCOUNTER — Inpatient Hospital Stay (HOSPITAL_COMMUNITY): Payer: Medicare Other | Admitting: Speech Pathology

## 2017-05-21 LAB — BASIC METABOLIC PANEL
ANION GAP: 8 (ref 5–15)
BUN: 10 mg/dL (ref 6–20)
CHLORIDE: 104 mmol/L (ref 101–111)
CO2: 23 mmol/L (ref 22–32)
Calcium: 8.4 mg/dL — ABNORMAL LOW (ref 8.9–10.3)
Creatinine, Ser: 0.93 mg/dL (ref 0.61–1.24)
GFR calc non Af Amer: >60 mL/min (ref >60)
GLUCOSE: 133 mg/dL — AB (ref 65–99)
POTASSIUM: 3.9 mmol/L (ref 3.5–5.1)
SODIUM: 135 mmol/L (ref 135–145)

## 2017-05-21 LAB — CBC
HCT: 35.1 % — ABNORMAL LOW (ref 39.0–52.0)
HEMOGLOBIN: 11.5 g/dL — AB (ref 13.0–17.0)
MCH: 27.9 pg (ref 26.0–34.0)
MCHC: 32.8 g/dL (ref 30.0–36.0)
MCV: 85.2 fL (ref 78.0–100.0)
Platelets: 128 10*3/uL — ABNORMAL LOW (ref 150–400)
RBC: 4.12 MIL/uL — ABNORMAL LOW (ref 4.22–5.81)
RDW: 12.7 % (ref 11.5–15.5)
WBC: 4.1 10*3/uL (ref 4.0–10.5)

## 2017-05-21 NOTE — IPOC Note (Signed)
Overall Plan of Care [facility]) Patient Details Name: Mario Liu MRN: [id number] DOB: 1961/07/01  Admitting Diagnosis: rehab  Hospital Problems: Principal Problem:   Acute ischemic left MCA stroke ([place]) Active Problems:   Down's syndrome   Major depressive disorder     Functional Problem List: Nursing Behavior, Bowel, Endurance, Perception, Safety, Skin Integrity  PT Balance, Perception, Safety, Endurance, Behavior  OT Behavior, Balance, Safety, Cognition, Edema, Endurance, Motor, Nutrition, Pain  SLP Cognition  TR Endurance, Motor, Safety       Basic ADL's: OT Grooming, Bathing, Dressing, Toileting     Advanced  ADL's: OT       Transfers: PT Bed Mobility, Bed to Chair, Car, Manufacturing systems engineer, Metallurgist: PT Ambulation, Stairs     Additional Impairments: OT None  SLP Swallowing, Communication expression    TR      Anticipated Outcomes Item Anticipated Outcome  Self Feeding supervision  Swallowing  Min A   Basic self-care  supervision  Toileting  supervision   Bathroom Transfers supervision  Bowel/Bladder  Patient manage bowels with Mod I  Transfers  Supervision   Locomotion  Supervision  Communication  Mod A  Cognition  Mod A  Pain  less than 3  Safety/Judgment  Remain free of falls, skin breakdown and infection   Therapy Plan: PT Intensity: Minimum of 1-2 x/day ,45 to 90 minutes PT Frequency: 5 out of 7 days PT Duration Estimated Length of Stay: 7-10 days OT Intensity: Minimum of 1-2 x/day, 45 to 90 minutes OT Frequency: 5 out of 7 days OT Duration/Estimated Length of Stay: ~7-10  days SLP Intensity: Minumum of 1-2 x/day, 30 to 90 minutes SLP Frequency: 3 to 5 out of 7 days SLP Duration/Estimated Length of Stay: 9/11 1 week    Team Interventions: Nursing Interventions Patient/Family Education, Bowel Management, Medication Management, Skin Care/Wound Management, Dysphagia/Aspiration Precaution Training, Cognitive  Remediation/Compensation, Discharge Planning  PT interventions Ambulation/gait training, Training and development officer, Community reintegration, Neuromuscular re-education, Stair training, UE/LE Strength taining/ROM, UE/LE Coordination activities, Discharge planning, Therapeutic Exercise, Patient/family education, Functional mobility training  OT Interventions Balance/vestibular training, Discharge planning, Functional electrical stimulation, Pain management, Self Care/advanced ADL retraining, Therapeutic Activities, UE/LE Coordination activities, Cognitive remediation/compensation, Disease mangement/prevention, Functional mobility training, Patient/family education, Skin care/wound managment, Therapeutic Exercise, Visual/perceptual remediation/compensation, UE/LE Strength taining/ROM, Splinting/orthotics, Psychosocial support, Neuromuscular re-education, DME/adaptive equipment instruction, Community reintegration  SLP Interventions Cognitive remediation/compensation, English as a second language teacher, Dysphagia/aspiration precaution training, Functional tasks, Speech/Language facilitation  TR Interventions    SW/CM Interventions Discharge Planning, Psychosocial Support, Patient/Family Education   Barriers to Discharge MD  Medical stability  Nursing      PT Behavior (decreased safety awareness & cognition )    OT      SLP  (baseline cognition level)    SW       Team Discharge Planning: Destination: PT-Home (Group Home) ,OT- Home , SLP-Assisted Living (group home) Projected Follow-up: PT-24 hour supervision/assistance, Outpatient PT, OT-  Home health OT, SLP-24 hour supervision/assistance Projected Equipment Needs: PT-To be determined, OT- None recommended by OT, SLP-None recommended by SLP Equipment Details: PT- , OT-  Patient/family involved in discharge planning: PT- Patient,  OT-Patient, SLP-Patient  MD ELOS: 7-10 days Medical Rehab Prognosis:  Excellent Assessment: The patient has been admitted for CIR  therapies with the diagnosis of left mca infarct. Hx of downs syndrome. The team will be addressing functional mobility, strength, stamina, balance, safety, adaptive techniques and equipment, self-care, bowel and bladder mgt, patient and caregiver  education, NMR, visual-spatial awareness, behavior, care giver ed, swallowing, communication/cognition. Goals have been set at supervision for mobility and self-care and min to mod assist for communication and swallowing   Meredith Staggers, MD, [facility]      See Team Conference Notes for weekly updates to the plan of care

## 2017-05-21 NOTE — Progress Notes (Signed)
Occupational Therapy Session Note  Patient Details  Name: Mario Liu MRN: [id number] Date of Birth: 1961-06-18  Today's Date: 05/21/2017 OT Individual Time: 0900-1000 OT Individual Time Calculation (min): 60 min    Short Term Goals: Week 1:  OT Short Term Goal 1 (Week 1): LTG=STG  Skilled Therapeutic Interventions/Progress Updates:    Pt engaged in BADL retraining including bathing at shower level and dressing with sit<>stand from w/c.  Pt amb into bathroom and completed shower while standing in shower with min verbal cues for thoroughness.  Pt completed bathing/dressing tasks with steady A for standing balance in shower and when doffing pants prior to shower.  Pt attempted to remove pants while standing.  Pt amb without AD at supervision level to ADL apartment and engaged in simple home mgmt tasks with focus on functional balance.  Pt returned to room and requested to return to bed.  Pt remained in bed with RN present.   Therapy Documentation Precautions:  Precautions Precautions: Fall Restrictions Weight Bearing Restrictions: No General:   Vital Signs:  Pain: Pain Assessment Pain Assessment: No/denies pain ADL: ADL ADL Comments: see functional navigator Vision   Perception    Praxis   Exercises:   Other Treatments:    See Function Navigator for Current Functional Status.   Therapy/Group: Individual Therapy  [name] 05/21/2017, 10:07 AM

## 2017-05-21 NOTE — Progress Notes (Signed)
Physical Therapy Session Note  Patient Details  Name: Mario Liu MRN: [id number] Date of Birth: 1961/08/17  Today's Date: 05/21/2017 PT Individual Time: 1345-1500 PT Individual Time Calculation (min): 75 min   Short Term Goals: Week 1:  PT Short Term Goal 1 (Week 1): = LTG due to length of stay   Skilled Therapeutic Interventions/Progress Updates:    Pt received lying in bed, sleeping. Agreeable to therapy. Pt performed mouth hygiene before and after eating some lunch with min assist for suctioning and max cues for completing task. Pt ate lunch with supervision and cues for swallowing. One episode of coughing after eating. Pt ambulated 200 ft, then 150 ft with no AD and close supervision. Pt performed several activities with basketball to work on coordination and balance (toss against trampoline, tossing/bounce pass back and forth, dribbling) with close supervision. Cues for posture and continuing task. Pt performed Nustep with bilateral UE & LE for 10 min, L4. Pt ambulated 150 ft back to room as above. Able to return to room with no cues for direction. Pt transferred on/off toilet & performed hand hygiene with supervision and min cues. Pt left sitting in wheelchair, chair alarm belt on, all needs in reach.   Therapy Documentation Precautions:  Precautions Precautions: Fall Restrictions Weight Bearing Restrictions: No   See Function Navigator for Current Functional Status.   Therapy/Group: Individual Therapy  Gwinda Passe, SPT 05/21/2017, 3:07 PM

## 2017-05-21 NOTE — Progress Notes (Signed)
Social Work Patient ID: Mario Liu, male   DOB: 03/06/1961, 56 y.o.   MRN: [id number]   Mario Liu, Mario Legacy, LCSW Social Worker Signed   Patient Care Conference Date of Service: 05/21/2017  9:55 AM      Hide copied text Hover for attribution information Inpatient RehabilitationTeam Conference and Plan of Care Update Date: 05/19/2017   Time: 2:20 PM      Patient Name: Mario Liu      Medical Record Number: [id number]  Date of Birth: [birth year] Sex: Male         Room/Bed: 4W21C/4W21C-01 Payor Info: Payor: MEDICARE / Plan: MEDICARE PART A AND B / Product Type: *No Product type* /     Admitting Diagnosis: rehab  Admit Date/Time:  05/18/2017  3:41 PM Admission Comments: No comment available    Primary Diagnosis:  Acute ischemic left MCA stroke ([place]) Principal Problem: Acute ischemic left MCA stroke [facility])       Patient Active Problem List    Diagnosis Date Noted  . Major depressive disorder    . Hepatitis C antibody test positive 05/16/2017  . Acute ischemic left MCA stroke ([place])    . Down's syndrome    . PFO with atrial septal aneurysm        Expected Discharge Date: Expected Discharge Date: 05/26/17   Team Members Present: Physician leading conference: Dr. Alger Simons Social Worker Present: Alfonse Alpers, LCSW Nurse Present: Other (comment) Rockwell Germany, RN) PT Present: Kem Parkinson, PT OT Present: Willeen Cass, OT;Roanna Epley, COTA SLP Present: Stormy Fabian, SLP PPS Coordinator present : Daiva Nakayama, RN, CRRN       Current Status/Progress Goal Weekly Team Focus  Medical     admitted after left MCA infarct. dysphagia, poor safety awareness  improve swallowing and nutrition  right hemiparesis, nutrition/dysphagia mgt   Bowel/Bladder     Patient is continent of bowel & bladder, has not had a BM since admission to Hospital on 8/31, given miralax yesterday, will try suppository  remain continent with regular bowel patterns  continue to assess, attain regualr BM  pattern starting with today   Swallow/Nutrition/ Hydration     dysphagia 1 with thin liquids (water only)  Min A   Max A for use of compensatory swallow strategies with current diet   ADL's     supervision/steady A overall; min verbal cues for safety, R inattention; RUE at diminished level  supervision overall  family education, activity tolerance, standing balance, attention to R, safety awareness   Mobility     Close supervision; occasional LOB during ambualtion, min assist to recover; cues for safety awareness   Supervision  Balance, endurance, safety awareness    Communication     Max A for simple word  Mod A  Max A cues to achieve ~ 25% intelligibility at the simple word level   Safety/Cognition/ Behavioral Observations   Max A for basic problem solving  Mod A  basic problem solving   Pain     no c/o pain, has tylenol ordered prn  pain scale <2  continue to assess & treat as needed   Skin     small open area to penis, no other areas of skin break down noted  no signs of infection & no new areas of skin break down  assess q shift     Rehab Goals Patient on target to meet rehab goals: Yes Rehab Goals Revised: none - first conference *See Care Plan and progress notes for long  and short-term goals.      Barriers to Discharge   Current Status/Progress Possible Resolutions Date Resolved   Physician     Behavior        ongoing safety education for patient and caregivers      Nursing                 PT  Behavior (decreased safety awareness & cognition )                 OT                 SLP  (baseline cognition level)            SW              Discharge Planning/Teaching Needs:  Pt to return to group home with 24/7 supervision and [place] therapies to come in.  Pt's group home staff will come for education on 05-25-17.   Team Discussion:  Pt with hx of down syndrome and now left MCA infarct.  Swallowing and cognition are pt's biggest deficits and pt is impulsive.  Pt with  supervision goals and short stay.  Per RN, follows some directions, takes meds well, and is continent of bladder and bowel.  Pt has bed/chair alarm.  PT said pt is min guard walking 150' due to loss of balance.  Needs endurance training.  OT said pt is min assist due to no safety awareness and has right neglect and fine motor problems.  Revisions to Treatment Plan:  none    Continued Need for Acute Rehabilitation Level of Care: The patient requires daily medical management by a physician with specialized training in physical medicine and rehabilitation for the following conditions: Daily direction of a multidisciplinary physical rehabilitation program to ensure safe treatment while eliciting the highest outcome that is of practical value to the patient.: Yes Daily medical management of patient stability for increased activity during participation in an intensive rehabilitation regime.: Yes Daily analysis of laboratory values and/or radiology reports with any subsequent need for medication adjustment of medical intervention for : Neurological problems   Nili Honda, Silvestre Mesi 05/21/2017, 10:06 AM

## 2017-05-21 NOTE — Progress Notes (Signed)
Social Work Assessment and Plan  Patient Details  Name: Mario Liu MRN: [id number] Date of Birth: Jan 16, 1961  Today's Date: 05/19/2017  Problem List:  Patient Active Problem List   Diagnosis Date Noted  . Major depressive disorder   . Hepatitis C antibody test positive 05/16/2017  . Acute ischemic left MCA stroke ([place])   . Down's syndrome   . PFO with atrial septal aneurysm    Past Medical History:  Past Medical History:  Diagnosis Date  . COPD (chronic obstructive pulmonary disease) ([place])   . Hepatitis C   . Hx of cholecystectomy   . Hypothyroidism   . Kidney cysts   . Major depressive disorder    Was treated at facility in [place] [facility] years ago  . Tobacco abuse   . Vitamin D deficiency    Past Surgical History:  Past Surgical History:  Procedure Laterality Date  . LAPAROSCOPIC CHOLECYSTECTOMY     Social History:  reports that he has quit smoking. His smoking use included Cigarettes. He has a 39.00 pack-year smoking history. He has never used smokeless tobacco. He reports that he does not drink alcohol or use drugs.  Family / Support Systems Marital Status: Single Patient Roles: Other (Comment) (brother; student) Other Supports: [name] - sister and guardian - [phone] Anticipated Caregiver: *Burnett Kanaris - group home director - [phone];  Tawana Scale - group home employee - [phone] Ability/Limitations of Caregiver: None Caregiver Availability: 24/7 Family Dynamics: Sister and group home director are very supportive.  Social History Preferred language: English Religion: None Write: Yes Employment Status:  (Pt works at [employer] some and goes to school.) Public relations account executive Issues: none reported Guardian/Conservator: Pt's sister is his guardian.   Abuse/Neglect Physical Abuse: Denies Verbal Abuse: Denies Sexual Abuse: Denies Exploitation of patient/patient's resources: Denies Self-Neglect:  Denies  Emotional Status Pt's affect, behavior and adjustment status: Pt with cognitive impairments from the stroke, so difficult to assess, but pt smiled when CSW mentioned he could go home next week.  Cooperates with therapies. Recent Psychosocial Issues: Pt with hx of down syndrome and now a stroke.   Psychiatric History: none reported Substance Abuse History: tobacco use reported on chart  Patient / Family Perceptions, Expectations & Goals Pt/Family understanding of illness & functional limitations: Pt's sister demonstrates a good understanding of pt's limitations pre-stroke and the differences now. Premorbid pt/family roles/activities: Pt enjoys going to school; helping out around the group home cleaning, etc; going for walls outside; working; and spending time with sister and group home staff/residents. Anticipated changes in roles/activities/participation: Pt would like resume the above as he is able. Pt/family expectations/goals: Sister would like for pt to get back to the group home to enjoy his friends there and to swallow/speak better.  Recruitment consultant:  ([facility] [place]) Premorbid Home Care/DME Agencies: None Transportation available at discharge: group home to transport back home  Discharge Planning Living Arrangements: [place]: Other relatives Type of Residence: Group home Insurance Resources: [employer], [employer] (specify county) Set designer) Financial Resources: [employer] Financial Screen Referred: No Living Expenses: Other (Comment) (group home) Money Management: Family Does the patient have any problems obtaining your medications?: No Home Management: Pt and his friends/staff at group home do this together. Patient/Family Preliminary Plans: Pt to return to the group home at d/c with Ms. Gerald Stabs and her staff to provide 24/7 supervision. Social Work Anticipated Follow Up Needs: HH/OP Expected length of stay: ~  7 days   Clinical  Impression CSW met with pt and his sister to introduce self and role of CSW, as well as to complete assessment.  Pt is looking forward to getting back to the group home.  CSW spoke with group home director who will come for family education and she or her staff will provide supervision to pt.  Pt will need f/u therapies and group home director says she usually uses [facility].  Pt will not go to school or workshop for a while as he recovers from stroke.  Sister lives in [place], but visits regularly and is supportive and accessible.  CSW will continue to follow and assist with pt's transfer back to group home on 05-26-17.  Tymeshia Awan, Silvestre Mesi 05/21/2017, 1:50 PM

## 2017-05-21 NOTE — Progress Notes (Cosign Needed)
[facility] Individual Statement of Services  Patient Name:  Mario Liu  Date:  05/21/2017  Welcome to the [facility].  Our goal is to provide you with an individualized program based on your diagnosis and situation, designed to meet your specific needs.  With this comprehensive rehabilitation program, you will be expected to participate in at least 3 hours of rehabilitation therapies Monday-Friday, with modified therapy programming on the weekends.  Your rehabilitation program will include the following services:  Physical Therapy (PT), Occupational Therapy (OT), Speech Therapy (ST), 24 hour per day rehabilitation nursing, Therapeutic Recreaction (TR), Case Management (Social Worker), Rehabilitation Medicine, Nutrition Services and Pharmacy Services  Weekly team conferences will be held on Tuesdays to discuss your progress.  Your Social Worker will talk with you frequently to get your input and to update you on team discussions.  Team conferences with you and your family in attendance may also be held.  Expected length of stay:  1 week  Overall anticipated outcome:  Supervision  Depending on your progress and recovery, your program may change. Your Social Worker will coordinate services and will keep you informed of any changes. Your Social Worker's name and contact numbers are listed  below.  The following services may also be recommended but are not provided by the [facility] will be made to provide these services after discharge if needed.  Arrangements include referral to agencies that provide these services.  Your insurance has been verified to be:  Medicare and Medicaid Your primary doctor is:  Threasa Alpha, FNP  Pertinent information will be shared with your doctor and your  insurance company.  Social Worker:  Alfonse Alpers, LCSW  [phone] or ([phone]  Information discussed with and copy given to patient by: Trey Sailors, 05/21/2017, 1:35 PM

## 2017-05-21 NOTE — Progress Notes (Signed)
Speech Language Pathology Daily Session Note  Patient Details  Name: Mario Liu MRN: [id number] Date of Birth: Jul 13, 1961  Today's Date: 05/21/2017 SLP Individual Time: 0725-0825 SLP Individual Time Calculation (min): 60 min  Short Term Goals: Week 1: SLP Short Term Goal 1 (Week 1): Pt will consume current diet without overt s/s aspiration with Max A verbal cues for use of swallow compensatory strategies.  SLP Short Term Goal 2 (Week 1): Pt will consume trials of dsyphagia 2 textured foods with Mod verbal cues to reduce lingual residue and no overt s/s aspiration in order to demonstrate readiness for diet advancement. SLP Short Term Goal 3 (Week 1): Pt will complete basic, familar tasks with Max A mulimodal cues for functional problem solving.  SLP Short Term Goal 4 (Week 1): Pt will express wants/needs via multimodal communciation with Mod A verbal cues. SLP Short Term Goal 5 (Week 1): Pt will demonstrate speech intellgibility at the word level with 60% accuarcy and Max A multimodal cues for use of strategies.  Skilled Therapeutic Interventions: Skilled treatment session focused on dysphagia and cognition goals. SLP facilitated session by providing skilled observation of pt consuming dysphagia 1 breakfast tray with thin liquids via cup and trials of graham crackers. Pt consumed puree with complete oral clearing and Mod A verbal and tactile cues to control rate. Pt consumed thin water via cup with Mod A cues for rate and 1 throat clear throughout 6 oz. Pt consumed graham crackers with increased bolus manipulation and complete oral clearing. To help control bolus size, pt given small pieces of cracker instead of holding larger cracker. Pt able to intelligibility state "bathroom" and able to follow 1 step directions for toileting with Mod A cues. Pt required Mo da to sustain attention to task for ~ 60 minutes. Pt was returned to room, left upright in wheelchair, chair alarm in place and all needs within  reach. Continue per current plan of care.      Function:  Eating Eating   Modified Consistency Diet: Yes Eating Assist Level: Supervision or verbal cues;Helper checks for pocketed food;Set up assist for   Eating Set Up Assist For: Opening containers       Cognition Comprehension Comprehension assist level: Understands basic 25 - 49% of the time/ requires cueing 50 - 75% of the time  Expression   Expression assist level: Expresses basic 25 - 49% of the time/requires cueing 50 - 75% of the time. Uses single words/gestures.  Social Interaction Social Interaction assist level: Interacts appropriately 75 - 89% of the time - Needs redirection for appropriate language or to initiate interaction.  Problem Solving Problem solving assist level: Solves basic less than 25% of the time - needs direction nearly all the time or does not effectively solve problems and may need a restraint for safety  Memory Memory assist level: Recognizes or recalls less than 25% of the time/requires cueing greater than 75% of the time    Pain Pain Assessment Pain Assessment: No/denies pain  Therapy/Group: Individual Therapy  Kyanna Mahrt 05/21/2017, 9:08 AM

## 2017-05-21 NOTE — Progress Notes (Signed)
Social Work Patient ID: Mario Liu, male   DOB: 30-Sep-1960, 56 y.o.   MRN: [id number]   CSW met with pt and his sister to update them on team conference discussion and targeted d/c date of 05-26-17.  Sister was pleased with this and pt is very excited to go home.  CSW called the group home director and she will come for training on Monday, September 10th and bring another staff member with her.  They are prepared to provide pt with supervision and are open to pt having [place] therapy in the group home.  CSW will continue to work with pt, sister, and group home staff to provide a smooth transition home.

## 2017-05-21 NOTE — Progress Notes (Signed)
[place] PHYSICAL MEDICINE & REHABILITATION     PROGRESS NOTE    Subjective/Complaints: No new complaints. Uneventful night  ROS: pt denies nausea, vomiting, diarrhea, cough, shortness of breath or chest pain   Objective: Vital Signs: Blood pressure 94/65, pulse 87, temperature (!) 97.4 F (36.3 C), temperature source Oral, resp. rate 17, height 5\' 5"  (1.651 m), weight 96.4 kg (212 lb 8 oz), SpO2 92 %. No results found.  Recent Labs  05/19/17 0923 05/21/17 0542  WBC 5.9 4.1  HGB 13.4 11.5*  HCT 40.5 35.1*  PLT 113* 128*    Recent Labs  05/19/17 0923 05/21/17 0542  NA 136 135  K 3.9 3.9  CL 107 104  GLUCOSE 171* 133*  BUN 12 10  CREATININE 1.02 0.93  CALCIUM 8.7* 8.4*   CBG (last 3)  No results for input(s): GLUCAP in the last 72 hours.  Wt Readings from Last 3 Encounters:  05/20/17 96.4 kg (212 lb 8 oz)  05/14/17 100 kg (220 lb 7.4 oz)  05/14/17 100.3 kg (221 lb 3.2 oz)    Physical Exam:  Constitutional: He appears well-developedand well-nourished.  HENT:  Head: Normocephalicand atraumatic.  Mouth/Throat: Oropharynx is clear and moist.  Eyes: Pupils are equal, round, and reactive to light. Conjunctivaeand EOMare normal.  Neck: Normal range of motion. Neck supple.  Cardiovascular: RRR without murmur. No JVD    Respiratory: a few scattered wheezes. No distress  GI: Soft. Bowel sounds are normal.  Musculoskeletal: He exhibits no edemaor tenderness.  Neurological: He is alert.  Oriented to self and hospital. Moves all 4's. Impulsive. Fair trunk control.  Skin: Skin is warmand dry.  Psychiatric: flat  Assessment/Plan: 1. Functional deficits secondary to left MCA infarct which require 3+ hours per day of interdisciplinary therapy in a comprehensive inpatient rehab setting. Physiatrist is providing close team supervision and 24 hour management of active medical problems listed below. Physiatrist and rehab team continue to assess barriers to  discharge/monitor patient progress toward functional and medical goals.  Function:  Bathing Bathing position   Position: Shower  Bathing parts Body parts bathed by patient: Right arm, Left arm, Chest, Abdomen, Front perineal area, Right upper leg, Left upper leg Body parts bathed by helper: Buttocks, Right lower leg, Left lower leg, Back  Bathing assist Assist Level: Touching or steadying assistance(Pt > 75%)      Upper Body Dressing/Undressing Upper body dressing   What is the patient wearing?: Pull over shirt/dress     Pull over shirt/dress - Perfomed by patient: Thread/unthread right sleeve, Thread/unthread left sleeve, Put head through opening, Pull shirt over trunk          Upper body assist Assist Level: Supervision or verbal cues      Lower Body Dressing/Undressing Lower body dressing   What is the patient wearing?: Pants, Socks, Shoes Underwear - Performed by patient: Thread/unthread right underwear leg, Thread/unthread left underwear leg, Pull underwear up/down   Pants- Performed by patient: Thread/unthread right pants leg, Thread/unthread left pants leg, Pull pants up/down       Socks - Performed by patient: Don/doff right sock, Don/doff left sock Socks - Performed by helper: Don/doff left sock Shoes - Performed by patient: Don/doff right shoe, Don/doff left shoe Shoes - Performed by helper: Fasten right, Fasten left          Lower body assist Assist for lower body dressing: Touching or steadying assistance (Pt > 75%)      Child psychotherapist  steps completed by patient: Adjust clothing prior to toileting, Performs perineal hygiene, Adjust clothing after toileting      Toileting assist Assist level: Touching or steadying assistance (Pt.75%)   Transfers Chair/bed transfer     Chair/bed transfer assist level: Supervision or verbal cues       Locomotion Ambulation     Max distance: 150 Assist level: Touching or steadying assistance (Pt  > 75%)   Wheelchair          Cognition Comprehension Comprehension assist level: Understands basic 25 - 49% of the time/ requires cueing 50 - 75% of the time  Expression Expression assist level: Expresses basic 25 - 49% of the time/requires cueing 50 - 75% of the time. Uses single words/gestures.  Social Interaction Social Interaction assist level: Interacts appropriately 75 - 89% of the time - Needs redirection for appropriate language or to initiate interaction.  Problem Solving Problem solving assist level: Solves basic less than 25% of the time - needs direction nearly all the time or does not effectively solve problems and may need a restraint for safety  Memory Memory assist level: Recognizes or recalls less than 25% of the time/requires cueing greater than 75% of the time   Medical Problem List and Plan: 1. Functional, language, and swallowing deficitssecondary to left MCA infarct -continue therapies 2. DVT Prophylaxis/Anticoagulation: Pharmaceutical: Lovenox 3. Pain Management: N/A 4. Mood: LCSW to follow for evaluation and support.  5. Neuropsych: This patient is notcapable of making decisions on hisown behalf. 6. Skin/Wound Care: routine pressure relief measures.  7. Fluids/Electrolytes/Nutrition: Monitor I/O. Labs reviewed. Hemolysis, some elevation of Cr.   -BMET reviewed and normal today 8. COPD/Asthma: Will add albuterol nebs prn. Sister reports that he quit smoking a couple of months ago "with use of medication" 9. Major depressive disorder: On Wellbutrin XL, Celexa, Risperdal and Klonopin 10. H/o Chronic Hep C with hepatitis/Thrombocytopenia:   -platelets 113k on 9.4 up to 128 today  - continue lovenox for now 11. Constipation: Start bowel program.  12. Dysphagia: Full supervision for safety. Dysphagia 1, thin liquids--advancing per SLP to D2.   LOS (Days) 3 A FACE TO FACE EVALUATION WAS PERFORMED  Meredith Staggers, MD 05/21/2017 9:23 AM

## 2017-05-21 NOTE — Progress Notes (Signed)
Physical Therapy Session Note  Patient Details  Name: Aristide Waggle MRN: [id number] Date of Birth: 08/09/1961  Today's Date: 05/21/2017 PT Individual Time: 1130-1200 PT Individual Time Calculation (min): 30 min   Short Term Goals: Week 1:  PT Short Term Goal 1 (Week 1): = LTG due to length of stay   Skilled Therapeutic Interventions/Progress Updates:     Pt received supine in bed and agreeable to PT. Supine>sit transfer with supervision assist and  Min cues for safety.   PT instructed pt in gait training through rehab unit with supervision assist moderate cues for awareness of R side for navigation through hall. Gait training over ramp and mulched surface with close supervision assist from PT.   Dynamic balance training instructed by PT for reciprocal foot taps on 8 inch step. Lateral foot taps on 8 inch step, variable gait training forawrd/backward and side stepping 2x 10 bilaterall with min-supervision assist from PT.   Pt returned to room and performed ambulatory transfer to bed with supervision assist. Sit>supine completed with supervision assist. Pt left supine in bed with call bell in reach and all needs met.     Therapy Documentation Precautions:  Precautions Precautions: Fall Restrictions Weight Bearing Restrictions: No Vital Signs: Therapy Vitals Temp: (!) 97.5 F (36.4 C) Temp Source: Oral Pulse Rate: 98 Resp: 20 BP: 110/77 Patient Position (if appropriate): Sitting Oxygen Therapy SpO2: 98 % O2 Device: Not Delivered Pain 0/10   See Function Navigator for Current Functional Status.   Therapy/Group: Individual Therapy  Lorie Phenix 05/21/2017, 3:59 PM

## 2017-05-21 NOTE — Patient Care Conference (Signed)
Inpatient RehabilitationTeam Conference and Plan of Care Update Date: 05/19/2017   Time: 2:20 PM    Patient Name: Mario Liu      Medical Record Number: [id number]  Date of Birth: 11-02-1960 Sex: Male         Room/Bed: 4W21C/4W21C-01 Payor Info: Payor: MEDICARE / Plan: MEDICARE PART A AND B / Product Type: *No Product type* /    Admitting Diagnosis: rehab  Admit Date/Time:  05/18/2017  3:41 PM Admission Comments: No comment available   Primary Diagnosis:  Acute ischemic left MCA stroke ([place]) Principal Problem: Acute ischemic left MCA stroke [facility])  Patient Active Problem List   Diagnosis Date Noted  . Major depressive disorder   . Hepatitis C antibody test positive 05/16/2017  . Acute ischemic left MCA stroke ([place])   . Down's syndrome   . PFO with atrial septal aneurysm     Expected Discharge Date: Expected Discharge Date: 05/26/17  Team Members Present: Physician leading conference: Dr. Alger Simons Social Worker Present: Alfonse Alpers, LCSW Nurse Present: Other (comment) Rockwell Germany, RN) PT Present: Kem Parkinson, PT OT Present: Willeen Cass, OT;Roanna Epley, COTA SLP Present: Stormy Fabian, SLP PPS Coordinator present : Daiva Nakayama, RN, CRRN     Current Status/Progress Goal Weekly Team Focus  Medical   admitted after left MCA infarct. dysphagia, poor safety awareness  improve swallowing and nutrition  right hemiparesis, nutrition/dysphagia mgt   Bowel/Bladder   Patient is continent of bowel & bladder, has not had a BM since admission to Hospital on 8/31, given miralax yesterday, will try suppository  remain continent with regular bowel patterns  continue to assess, attain regualr BM pattern starting with today   Swallow/Nutrition/ Hydration   dysphagia 1 with thin liquids (water only)  Min A   Max A for use of compensatory swallow strategies with current diet   ADL's   supervision/steady A overall; min verbal cues for safety, R inattention; RUE at diminished  level  supervision overall  family education, activity tolerance, standing balance, attention to R, safety awareness   Mobility   Close supervision; occasional LOB during ambualtion, min assist to recover; cues for safety awareness   Supervision  Balance, endurance, safety awareness    Communication   Max A for simple word  Mod A  Max A cues to achieve ~ 25% intelligibility at the simple word level   Safety/Cognition/ Behavioral Observations  Max A for basic problem solving  Mod A  basic problem solving   Pain   no c/o pain, has tylenol ordered prn  pain scale <2  continue to assess & treat as needed   Skin   small open area to penis, no other areas of skin break down noted  no signs of infection & no new areas of skin break down  assess q shift    Rehab Goals Patient on target to meet rehab goals: Yes Rehab Goals Revised: none - first conference *See Care Plan and progress notes for long and short-term goals.     Barriers to Discharge  Current Status/Progress Possible Resolutions Date Resolved   Physician    Behavior        ongoing safety education for patient and caregivers      Nursing                  PT  Behavior (decreased safety awareness & cognition )                 OT  SLP  (baseline cognition level)              SW                Discharge Planning/Teaching Needs:  Pt to return to group home with 24/7 supervision and [place] therapies to come in.  Pt's group home staff will come for education on 05-25-17.   Team Discussion:  Pt with hx of down syndrome and now left MCA infarct.  Swallowing and cognition are pt's biggest deficits and pt is impulsive.  Pt with supervision goals and short stay.  Per RN, follows some directions, takes meds well, and is continent of bladder and bowel.  Pt has bed/chair alarm.  PT said pt is min guard walking 150' due to loss of balance.  Needs endurance training.  OT said pt is min assist due to no safety awareness and has  right neglect and fine motor problems.  Revisions to Treatment Plan:  none    Continued Need for Acute Rehabilitation Level of Care: The patient requires daily medical management by a physician with specialized training in physical medicine and rehabilitation for the following conditions: Daily direction of a multidisciplinary physical rehabilitation program to ensure safe treatment while eliciting the highest outcome that is of practical value to the patient.: Yes Daily medical management of patient stability for increased activity during participation in an intensive rehabilitation regime.: Yes Daily analysis of laboratory values and/or radiology reports with any subsequent need for medication adjustment of medical intervention for : Neurological problems  Oland Arquette, Silvestre Mesi 05/21/2017, 10:06 AM

## 2017-05-22 ENCOUNTER — Inpatient Hospital Stay (HOSPITAL_COMMUNITY): Payer: Medicare Other

## 2017-05-22 ENCOUNTER — Inpatient Hospital Stay (HOSPITAL_COMMUNITY): Payer: Medicare Other | Admitting: Speech Pathology

## 2017-05-22 DIAGNOSIS — D696 Thrombocytopenia, unspecified: Secondary | ICD-10-CM

## 2017-05-22 NOTE — Progress Notes (Signed)
Occupational Therapy Session Note  Patient Details  Name: Mario Liu MRN: [id number] Date of Birth: 1961-03-31  Today's Date: 05/22/2017 OT Individual Time: 1100-1155 OT Individual Time Calculation (min): 55 min    Short Term Goals: Week 1:  OT Short Term Goal 1 (Week 1): LTG=STG  Skilled Therapeutic Interventions/Progress Updates:    Pt initially engaged in bathing at shower level and dressing while standing to don pants and sitting to don socks.  Pt amb without AD and completed shower while standing-supervision level.  Pt amb in room to select clothing.  Pt enjoyed listening to the [place] while engaged in amb throughout the unit and down the hall to other gyms.  Pt engaged in simple path finding tasks at supervision level.  Pt returned to room and remained in w/c with QRB in place and alarm activated.   Therapy Documentation Precautions:  Precautions Precautions: Fall Restrictions Weight Bearing Restrictions: No Pain:  Pt denies pain  See Function Navigator for Current Functional Status.   Therapy/Group: Individual Therapy  [name] 05/22/2017, 3:01 PM

## 2017-05-22 NOTE — Progress Notes (Signed)
[place] PHYSICAL MEDICINE & REHABILITATION     PROGRESS NOTE    Subjective/Complaints: No new complaints. Uneventful night  ROS: pt denies nausea, vomiting, diarrhea, cough, shortness of breath or chest pain   Objective: Vital Signs: Blood pressure 92/70, pulse 78, temperature 97.8 F (36.6 C), temperature source Oral, resp. rate 19, height 5\' 5"  (1.651 m), weight 96.4 kg (212 lb 8 oz), SpO2 97 %. No results found.  Recent Labs  05/21/17 0542  WBC 4.1  HGB 11.5*  HCT 35.1*  PLT 128*    Recent Labs  05/21/17 0542  NA 135  K 3.9  CL 104  GLUCOSE 133*  BUN 10  CREATININE 0.93  CALCIUM 8.4*   CBG (last 3)  No results for input(s): GLUCAP in the last 72 hours.  Wt Readings from Last 3 Encounters:  05/20/17 96.4 kg (212 lb 8 oz)  05/14/17 100 kg (220 lb 7.4 oz)  05/14/17 100.3 kg (221 lb 3.2 oz)    Physical Exam:  Constitutional: He appears well-developedand well-nourished.  HENT:  Head: Normocephalicand atraumatic.  Mouth/Throat: Oropharynx is clear and moist.  Eyes: Pupils are equal, round, and reactive to light. Conjunctivaeand EOMare normal.  Neck: Normal range of motion. Neck supple.  Cardiovascular: RRR without murmur. No JVD     Respiratory:CTA Bilaterally without wheezes or rales. Normal effort   GI: Soft. Bowel sounds are normal.  Musculoskeletal: He exhibits no edemaor tenderness.  Neurological: He is alert. Processing delays Oriented to self and hospital. Moves all 4's. Impulsive. Fair trunk control.  Skin: Skin is warmand dry.  Psychiatric: flat  Assessment/Plan: 1. Functional deficits secondary to left MCA infarct which require 3+ hours per day of interdisciplinary therapy in a comprehensive inpatient rehab setting. Physiatrist is providing close team supervision and 24 hour management of active medical problems listed below. Physiatrist and rehab team continue to assess barriers to discharge/monitor patient progress toward  functional and medical goals.  Function:  Bathing Bathing position   Position: Shower (standing)  Bathing parts Body parts bathed by patient: Right arm, Left arm, Chest, Abdomen, Front perineal area, Right upper leg, Left upper leg, Buttocks, Right lower leg, Left lower leg Body parts bathed by helper: Buttocks, Right lower leg, Left lower leg, Back  Bathing assist Assist Level: Touching or steadying assistance(Pt > 75%)      Upper Body Dressing/Undressing Upper body dressing   What is the patient wearing?: Pull over shirt/dress     Pull over shirt/dress - Perfomed by patient: Thread/unthread right sleeve, Thread/unthread left sleeve, Put head through opening, Pull shirt over trunk          Upper body assist Assist Level: Supervision or verbal cues      Lower Body Dressing/Undressing Lower body dressing   What is the patient wearing?: Pants, Socks, Shoes Underwear - Performed by patient: Thread/unthread right underwear leg, Thread/unthread left underwear leg, Pull underwear up/down   Pants- Performed by patient: Thread/unthread right pants leg, Thread/unthread left pants leg, Pull pants up/down       Socks - Performed by patient: Don/doff right sock, Don/doff left sock Socks - Performed by helper: Don/doff left sock Shoes - Performed by patient: Don/doff right shoe, Don/doff left shoe Shoes - Performed by helper: Fasten right, Fasten left          Lower body assist Assist for lower body dressing: Touching or steadying assistance (Pt > 75%)      Toileting Toileting   Toileting steps completed by patient:  Adjust clothing prior to toileting, Performs perineal hygiene, Adjust clothing after toileting Toileting steps completed by helper: Adjust clothing prior to toileting, Performs perineal hygiene, Adjust clothing after toileting (per [place], NT)    Toileting assist Assist level: Touching or steadying assistance (Pt.75%)   Transfers Chair/bed transfer      Chair/bed transfer assist level: Supervision or verbal cues       Locomotion Ambulation     Max distance: 200 ft Assist level: Supervision or verbal cues   Wheelchair          Cognition Comprehension Comprehension assist level: Understands basic 25 - 49% of the time/ requires cueing 50 - 75% of the time  Expression Expression assist level: Expresses basic 25 - 49% of the time/requires cueing 50 - 75% of the time. Uses single words/gestures.  Social Interaction Social Interaction assist level: Interacts appropriately 75 - 89% of the time - Needs redirection for appropriate language or to initiate interaction.  Problem Solving Problem solving assist level: Solves basic less than 25% of the time - needs direction nearly all the time or does not effectively solve problems and may need a restraint for safety  Memory Memory assist level: Recognizes or recalls less than 25% of the time/requires cueing greater than 75% of the time   Medical Problem List and Plan: 1. Functional, language, and swallowing deficitssecondary to left MCA infarct -continue therapies 2. DVT Prophylaxis/Anticoagulation: Pharmaceutical: Lovenox 3. Pain Management: N/A 4. Mood: LCSW to follow for evaluation and support.  5. Neuropsych: This patient is notcapable of making decisions on hisown behalf. 6. Skin/Wound Care: routine pressure relief measures.  7. Fluids/Electrolytes/Nutrition: Monitor I/O. Labs reviewed. Hemolysis, some elevation of Cr.    -continue to encourage PO 8. COPD/Asthma: Will add albuterol nebs prn. Sister reports that he quit smoking a couple of months ago "with use of medication" 9. Major depressive disorder: On Wellbutrin XL, Celexa, Risperdal and Klonopin 10. H/o Chronic Hep C with hepatitis/Thrombocytopenia:   -platelets 113k on 9.4 up to 128    - pt is ambulatory. Can dc lovenox  -recheck labs monday 11. Constipation:  bowel program.  12. Dysphagia: Full supervision  for safety. Dysphagia 1, thin liquids--advancing per SLP to D2.   LOS (Days) 4 A FACE TO FACE EVALUATION WAS PERFORMED  Meredith Staggers, MD 05/22/2017 9:38 AM

## 2017-05-22 NOTE — Progress Notes (Signed)
Physical Therapy Session Note  Patient Details  Name: Mario Liu MRN: [id number] Date of Birth: 02-27-1961  Today's Date: 05/22/2017 PT Individual Time: 1400-1458 PT Individual Time Calculation (min): 58 min   Short Term Goals: Week 1:  PT Short Term Goal 1 (Week 1): = LTG due to length of stay   Skilled Therapeutic Interventions/Progress Updates:    Session focused on functional gait on unit and outside in community environment (navigating elevators, uneven surfaces outside, and opening doors), overall functional endurance and activity tolerance, dynamic balance activities, and addressing R inattention. Pt overall supervision for gait with verbal cues for increased attention to obstacles on the R. Pt required mod verbal cues for pathfinding back to unit from outside. On unit, engaged pt in functional task to gather 2 items (w/c and BSC) to clean and put away including pt carrying items down hallway with focus on endurance, balance, attention and pathfinding (with repetition pt demonstrates decreased need for cues but still requires min assist - able to get back to room with supervision from familiar location). R inattention and functional dynamic balance addressed during Dyn[employer] task - first 2 reps with R side of board only illuminated and then progressed to full board with increased need for visual scanning. Decreased reaction time with repetition noted though still increased time for targets on R. Returned to room with all needs in reach and chair alarm on.   Therapy Documentation Precautions:  Precautions Precautions: Fall Restrictions Weight Bearing Restrictions: No   Pain:  Denies pain.   See Function Navigator for Current Functional Status.   Therapy/Group: Individual Therapy  Canary Brim Ivory Broad, PT, DPT  05/22/2017, 3:03 PM

## 2017-05-22 NOTE — Progress Notes (Signed)
Speech Language Pathology Daily Session Note  Patient Details  Name: Mario Liu MRN: [id number] Date of Birth: [birth year]  Today's Date: 05/22/2017 SLP Individual Time: 0730-0830 SLP Individual Time Calculation (min): 60 min  Short Term Goals: Week 1: SLP Short Term Goal 1 (Week 1): Pt will consume current diet without overt s/s aspiration with Max A verbal cues for use of swallow compensatory strategies.  SLP Short Term Goal 2 (Week 1): Pt will consume trials of dsyphagia 2 textured foods with Mod verbal cues to reduce lingual residue and no overt s/s aspiration in order to demonstrate readiness for diet advancement. SLP Short Term Goal 3 (Week 1): Pt will complete basic, familar tasks with Max A mulimodal cues for functional problem solving.  SLP Short Term Goal 4 (Week 1): Pt will express wants/needs via multimodal communciation with Mod A verbal cues. SLP Short Term Goal 5 (Week 1): Pt will demonstrate speech intellgibility at the word level with 60% accuarcy and Max A multimodal cues for use of strategies.  Skilled Therapeutic Interventions: Skilled treatment session focused on dysphagia and cognition goals. SLP facilitated session by providing skilled observation of pt consuming dysphagia 2 breakfast tray with thin water via cup. Pt consumed with Min A cues to initiate task and to control bolus size. Pt with good bolus manipulation and complete oral clearing. Pt with no overt s/s of aspiration with thin water. Pt required Min A cues for completion of basic familiar task. Pt returned to room, left upright in wheelchair, chair alarm on and all needs within reach.      Function:  Eating Eating   Modified Consistency Diet: Yes Eating Assist Level: Supervision or verbal cues;Set up assist for           Cognition Comprehension Comprehension assist level: Understands basic 25 - 49% of the time/ requires cueing 50 - 75% of the time  Expression   Expression assist level: Expresses basic  25 - 49% of the time/requires cueing 50 - 75% of the time. Uses single words/gestures.  Social Interaction Social Interaction assist level: Interacts appropriately 75 - 89% of the time - Needs redirection for appropriate language or to initiate interaction.  Problem Solving Problem solving assist level: Solves basic less than 25% of the time - needs direction nearly all the time or does not effectively solve problems and may need a restraint for safety;Solves basic 25 - 49% of the time - needs direction more than half the time to initiate, plan or complete simple activities  Memory Memory assist level: Recognizes or recalls less than 25% of the time/requires cueing greater than 75% of the time    Pain    Therapy/Group: Individual Therapy  Aeris Hersman 05/22/2017, 9:55 AM

## 2017-05-22 NOTE — Progress Notes (Signed)
Occupational Therapy Session Note  Patient Details  Name: Mario Liu MRN: [id number] Date of Birth: March 05, 1961  Today's Date: 05/22/2017 OT Individual Time: 1300-1328 OT Individual Time Calculation (min): 28 min    Short Term Goals: Week 1:  OT Short Term Goal 1 (Week 1): LTG=STG  Skilled Therapeutic Interventions/Progress Updates:    1:1. Pt ambulate throughout session with supervision picking up items off of floor and donning shoes in standing with no LOB. Pt plays mini golf/putting to improve BUE use and R attention with HOH A to move hand up putter to improve grasp and posture. Pt ambulates to outside area with min VC for path-finding way back to improve functional endurance. Exited session with pt seated in bed with call light in reach and safety alarm/belt on w/c.  Therapy Documentation Precautions:  Precautions Precautions: Fall Restrictions Weight Bearing Restrictions: No  See Function Navigator for Current Functional Status.   Therapy/Group: Individual Therapy  Tonny Branch 05/22/2017, 1:28 PM

## 2017-05-23 ENCOUNTER — Inpatient Hospital Stay (HOSPITAL_COMMUNITY): Payer: Medicare Other | Admitting: Physical Therapy

## 2017-05-23 ENCOUNTER — Inpatient Hospital Stay (HOSPITAL_COMMUNITY): Payer: Medicare Other | Admitting: Occupational Therapy

## 2017-05-23 ENCOUNTER — Inpatient Hospital Stay (HOSPITAL_COMMUNITY): Payer: Medicare Other

## 2017-05-23 DIAGNOSIS — F3341 Major depressive disorder, recurrent, in partial remission: Secondary | ICD-10-CM

## 2017-05-23 NOTE — Progress Notes (Addendum)
Physical Therapy Session Note  Patient Details  Name: Mario Liu MRN: [id number] Date of Birth: 09/20/1960  Today's Date: 05/23/2017 PT Individual Time: 1000-1055 AND 1500-1555 PT Individual Time Calculation (min): 55 min AND 55 min   Short Term Goals: Week 1:  PT Short Term Goal 1 (Week 1): = LTG due to length of stay   Skilled Therapeutic Interventions/Progress Updates:  Session 1 Pt received sitting in WC and agreeable to PT  PT instructed pt in gait training through hall of hospital and over uneven sidewalk surface. 500ftx 6 and 800ft. Distant supervision assist from PT with occassionally cues for attention to the R. Stair negotiation to descend 2 flights of stairs and Ascend 1 flight. Supervision assist from PT on stairs with min cues for step-to gait pattern with fatigue.   Dynamic balance training to weave through 8 cones x 6 and step over obstacles of various heights with moderat cues for visual scanning to find obstacles and prevent LOB over obstacles. supervision assist from PT overall.     Patient returned too room and left sitting in WC with call bell in reach and all needs met.     Session 2  Pt received supine in bed and agreeable to PT. Supine>sit transfer without assist.    PT instructed pt in gait training to Day room with supervision assist and min cues for safety awareness.   Static balance, problem sovling and visual scanning task instructed by PT to play Wii boweling. Supervision assist to maintain balance and moderate cues for problem solving to use remote. Pt noted to attend to the R side of the sceen 50% of the time and needed intermittent instruction to locate pins .   PT also instructed pt in dynamic balance training and improved visual scanning with Dynavision. Pt completed: A, one minute x 1. L A, 2 minutes x 3  A 2 minutes with T-scope(1 number, 1 sec) x 2  Average of 3 second to find tart on the R and 9 seconds to locate target on the Left for standard  A program, 12 secods to locate target with use of T-scope. Moderate progressing to Min cues for locate light on the R side and refer to t-scope as needed. Pt was ~50 accurate on Tscope.   Patient returned to room and left sitting in WC with call bell in reach and all needs met.     Therapy Documentation Precautions:  Precautions Precautions: Fall Restrictions Weight Bearing Restrictions: No Pain: Pain Assessment Pain Assessment: No/denies pain   See Function Navigator for Current Functional Status.   Therapy/Group: Individual Therapy   E  05/23/2017, 10:59 AM  

## 2017-05-23 NOTE — Progress Notes (Signed)
Occupational Therapy Session Note  Patient Details  Name: Mario Liu MRN: [id number] Date of Birth: [birth year]  Today's Date: 05/23/2017 OT Individual Time: 0900-1015 OT Individual Time Calculation (min): 75 min    Skilled Therapeutic Interventions/Progress Updates: Patient difficult to understand audibly this session.   The focus was on higher level balance activities and strengthening.   He completed in room shower standing and dressing in standing position without any losses of balance; so he completed dynamic standing balance for shower and dressing with distant supervision.  For balance of session, he worked on endurance as he seemed to exhibit some SOB during activities.   More focus was on the lower extremities during NuStep as he tended to use his arms for more of the strengtening activity.      When he used only his lower extremities he slowed his pace approximately 70% (Nu Step level 3, initially with arms and legs but at end for LE only to increase strength)  Patient walked back to room with close S and was assisted into his w/c with safety belt and call bell in place.     Therapy Documentation Precautions:  Precautions Precautions: Fall Restrictions Weight Bearing Restrictions: No   Pain: Pain Assessment Pain Assessment: No/denies pain  See Function Navigator for Current Functional Status.   Therapy/Group: Individual Therapy  Alfredia Ferguson [facility] 05/23/2017, 12:28 PM

## 2017-05-23 NOTE — Progress Notes (Signed)
Mario Liu is a 56 y.o. male 21-Feb-1961 [id number]  Subjective: In a w/c - dysarthric. No new problems. Slept well.   Objective: Vital signs in last 24 hours: Temp:  [98 F (36.7 C)-98.1 F (36.7 C)] 98.1 F (36.7 C) (09/08 0500) Pulse Rate:  [88-93] 93 (09/08 0500) Resp:  [20] 20 (09/08 0500) BP: (109-117)/(79-86) 117/79 (09/08 0500) SpO2:  [98 %-99 %] 99 % (09/08 0500) Weight change:  Last BM Date: 05/22/17  Intake/Output from previous day: 09/07 0701 - 09/08 0700 In: 420 [P.O.:420] Out: -  Last cbgs: CBG (last 3)  No results for input(s): GLUCAP in the last 72 hours.   Physical Exam General: No apparent distress  In a w/c - dysarthric HEENT: not dry Lungs: Normal effort. Lungs clear to auscultation, no crackles or wheezes. Cardiovascular: Regular rate and rhythm, no edema Abdomen: S/NT/ND; BS(+) Musculoskeletal:  unchanged Neurological: No new neurological deficits Wounds: N/A    Skin: clear   Mental state: Alert, cooperative    Lab Results: BMET    Component Value Date/Time   NA 135 05/21/2017 0542   K 3.9 05/21/2017 0542   CL 104 05/21/2017 0542   CO2 23 05/21/2017 0542   GLUCOSE 133 (H) 05/21/2017 0542   BUN 10 05/21/2017 0542   CREATININE 0.93 05/21/2017 0542   CALCIUM 8.4 (L) 05/21/2017 0542   GFRNONAA >60 05/21/2017 0542   GFRAA >60 05/21/2017 0542   CBC    Component Value Date/Time   WBC 4.1 05/21/2017 0542   RBC 4.12 (L) 05/21/2017 0542   HGB 11.5 (L) 05/21/2017 0542   HCT 35.1 (L) 05/21/2017 0542   PLT 128 (L) 05/21/2017 0542   MCV 85.2 05/21/2017 0542   MCH 27.9 05/21/2017 0542   MCHC 32.8 05/21/2017 0542   RDW 12.7 05/21/2017 0542   LYMPHSABS 1.6 05/19/2017 0923   MONOABS 0.4 05/19/2017 0923   EOSABS 0.1 05/19/2017 0923   BASOSABS 0.0 05/19/2017 0923    Studies/Results: No results found.  Medications: I have reviewed the patient's current medications.  Assessment/Plan:  1. L MCA - CIR. On ASA, Lipitor 2. Dysarthria -  Speech therapy 3. DVT proph - Lovenox 4. COPD - Albuterol prn 5. Dysphagia - D1 advancing to D2 diet 6. Depression - on Wellbutrin XL, Celexa, Risperdal and Klonopin 7. Chronic Hep C - monitoring labs    Length of stay, days: 5  Walker Kehr , MD 05/23/2017, 10:04 AM

## 2017-05-24 NOTE — Progress Notes (Signed)
Patient ID: Mario Liu, male   DOB: Nov 15, 1960, 56 y.o.   MRN: [id number]  Mario Liu is a 56 y.o. male 1961-08-01 [id number]  Subjective: No new complaints. No new problems. Slept well.   Objective: Vital signs in last 24 hours: Temp:  [97.6 F (36.4 C)-98.2 F (36.8 C)] 97.6 F (36.4 C) (09/09 0546) Pulse Rate:  [77-94] 77 (09/09 0546) Resp:  [14-18] 14 (09/09 0546) BP: (91-122)/(56-78) 122/78 (09/09 0546) SpO2:  [98 %-100 %] 100 % (09/09 0546) Weight change:  Last BM Date: 05/22/17  Intake/Output from previous day: 09/08 0701 - 09/09 0700 In: 240 [P.O.:240] Out: -  Last cbgs: CBG (last 3)  No results for input(s): GLUCAP in the last 72 hours.   Physical Exam General: No apparent distress   HEENT: not dry Lungs: Normal effort. Lungs clear to auscultation, no crackles or wheezes. Cardiovascular: Regular rate and rhythm, no edema Abdomen: S/NT/ND; BS(+) Musculoskeletal:  unchanged Neurological: No new neurological deficits Wounds: N/A    Skin: clear   Mental state: Asleep    Lab Results: BMET    Component Value Date/Time   NA 135 05/21/2017 0542   K 3.9 05/21/2017 0542   CL 104 05/21/2017 0542   CO2 23 05/21/2017 0542   GLUCOSE 133 (H) 05/21/2017 0542   BUN 10 05/21/2017 0542   CREATININE 0.93 05/21/2017 0542   CALCIUM 8.4 (L) 05/21/2017 0542   GFRNONAA >60 05/21/2017 0542   GFRAA >60 05/21/2017 0542   CBC    Component Value Date/Time   WBC 4.1 05/21/2017 0542   RBC 4.12 (L) 05/21/2017 0542   HGB 11.5 (L) 05/21/2017 0542   HCT 35.1 (L) 05/21/2017 0542   PLT 128 (L) 05/21/2017 0542   MCV 85.2 05/21/2017 0542   MCH 27.9 05/21/2017 0542   MCHC 32.8 05/21/2017 0542   RDW 12.7 05/21/2017 0542   LYMPHSABS 1.6 05/19/2017 0923   MONOABS 0.4 05/19/2017 0923   EOSABS 0.1 05/19/2017 0923   BASOSABS 0.0 05/19/2017 0923    Studies/Results: No results found.  Medications: I have reviewed the patient's current medications.  Assessment/Plan:  1. S/p  L MCA - on Lipitor and ASA. CIR. 2. Dysarthria - ST 3. DVT proph w/Lovenox 4. COPD - Albuterol prn 5. Dysphagia - D1 adv to D2 diet    6. Depression - on Wellbutrin XL, Celexa, Risperdal and Klonopin    7. Hep C - chronic      Length of stay, days: 6  Walker Kehr , MD 05/24/2017, 10:04 AM

## 2017-05-25 ENCOUNTER — Inpatient Hospital Stay (HOSPITAL_COMMUNITY): Payer: Medicare Other | Admitting: Speech Pathology

## 2017-05-25 ENCOUNTER — Ambulatory Visit (HOSPITAL_COMMUNITY): Payer: Medicare Other | Admitting: Physical Therapy

## 2017-05-25 ENCOUNTER — Encounter (HOSPITAL_COMMUNITY): Payer: Medicare Other

## 2017-05-25 ENCOUNTER — Inpatient Hospital Stay (HOSPITAL_COMMUNITY): Payer: Medicare Other

## 2017-05-25 LAB — CBC
HEMATOCRIT: 37.4 % — AB (ref 39.0–52.0)
HEMOGLOBIN: 11.9 g/dL — AB (ref 13.0–17.0)
MCH: 27.2 pg (ref 26.0–34.0)
MCHC: 31.8 g/dL (ref 30.0–36.0)
MCV: 85.4 fL (ref 78.0–100.0)
Platelets: 152 10*3/uL (ref 150–400)
RBC: 4.38 MIL/uL (ref 4.22–5.81)
RDW: 12.2 % (ref 11.5–15.5)
WBC: 5.8 10*3/uL (ref 4.0–10.5)

## 2017-05-25 MED ORDER — ACETAMINOPHEN 325 MG PO TABS: 325.0000 mg | ORAL_TABLET | ORAL | Status: DC | PRN

## 2017-05-25 MED ORDER — TRAZODONE HCL 50 MG PO TABS: 25.0000 mg | ORAL_TABLET | Freq: Every evening | ORAL | Status: DC | PRN

## 2017-05-25 MED ORDER — POLYETHYLENE GLYCOL 3350 17 G PO PACK: 17.0000 g | PACK | Freq: Every day | ORAL | 0 refills | Status: DC | PRN

## 2017-05-25 NOTE — Progress Notes (Signed)
Occupational Therapy Discharge Summary  Patient Details  Name: Mario Liu MRN: [id number] Date of Birth: [birth year]   Patient has met 11 of 11 long term goals due to improved activity tolerance, improved balance, postural control, ability to compensate for deficits and improved coordination.  Pt made steady progress with bathing/dressing and toileting tasks during this admission.  Pt completes all tasks at supervision level and will require 24 hour supervision at discharge.  Care givers have not been present for therapy sessions but have communicated that they will provide 24 hour supervision. Patient to discharge at overall Supervision level.  Patient's care partner is independent to provide the necessary physical and cognitive assistance at discharge.    Recommendation:  Patient will benefit from ongoing skilled OT services in home health setting to continue to advance functional skills in the area of BADL and Reduce care partner burden.  Equipment: No equipment provided  Reasons for discharge: treatment goals met and discharge from hospital  Patient/family agrees with progress made and goals achieved: Yes  OT Discharge Precautions/Restrictions  Restrictions Weight Bearing Restrictions: No Vision Baseline Vision/History: No visual deficits Additional Comments: decreased attention to right side  Cognition Orientation Level: Oriented X4 Safety/Judgment: Impaired (impulsive) Sensation Sensation Light Touch: Appears Intact Proprioception: Appears Intact Coordination Gross Motor Movements are Fluid and Coordinated: Yes Motor  Motor Motor: Within Functional Limits Mobility  Bed Mobility Rolling Right: 5: Supervision;6: Modified independent (Device/Increase time) Rolling Right Details: Other (comment) (no cues) Rolling Left: 5: Supervision;6: Modified independent (Device/Increase time) Rolling Left Details: Other (comment) (no cues) Supine to Sit: 6: Modified independent  (Device/Increase time) Supine to Sit Details: Other (Comment) (no cues) Sit to Supine: 6: Modified independent (Device/Increase time) Sit to Supine - Details: Other (Comment) (no cues) Transfers Sit to Stand: 5: Supervision Sit to Stand Details: Verbal cues for precautions/safety Stand to Sit: 5: Supervision Stand to Sit Details (indicate cue type and reason): Verbal cues for precautions/safety  Trunk/Postural Assessment  Cervical Assessment Cervical Assessment: Within Functional Limits Thoracic Assessment Thoracic Assessment: Within Functional Limits Lumbar Assessment Lumbar Assessment: Within Functional Limits Postural Control Postural Control: Within Functional Limits  Balance Berg Balance Test Sit to Stand: Able to stand  independently using hands Standing Unsupported: Able to stand safely 2 minutes Sitting with Back Unsupported but Feet Supported on Floor or Stool: Able to sit safely and securely 2 minutes Stand to Sit: Sits safely with minimal use of hands Transfers: Able to transfer safely, minor use of hands Standing Unsupported with Eyes Closed: Able to stand 10 seconds safely Standing Ubsupported with Feet Together: Able to place feet together independently and stand 1 minute safely From Standing, Reach Forward with Outstretched Arm: Can reach confidently >25 cm (10") From Standing Position, Pick up Object from Floor: Able to pick up shoe safely and easily From Standing Position, Turn to Look Behind Over each Shoulder: Turn sideways only but maintains balance Turn 360 Degrees: Able to turn 360 degrees safely in 4 seconds or less Standing Unsupported, Alternately Place Feet on Step/Stool: Able to stand independently and safely and complete 8 steps in 20 seconds Standing Unsupported, One Foot in Front: Able to plae foot ahead of the other independently and hold 30 seconds Standing on One Leg: Able to lift leg independently and hold 5-10 seconds Total Score: 51 Static  Sitting Balance Static Sitting - Level of Assistance: 6: Modified independent (Device/Increase time) Dynamic Sitting Balance Dynamic Sitting - Balance Support: Feet supported Dynamic Sitting - Level of Assistance: 6: Modified  independent (Device/Increase time) Static Standing Balance Static Standing - Balance Support: No upper extremity supported Static Standing - Level of Assistance: 5: Stand by assistance Dynamic Standing Balance Dynamic Standing - Balance Support: No upper extremity supported Dynamic Standing - Level of Assistance: 5: Stand by assistance Extremity/Trunk Assessment RUE Assessment RUE Assessment: Within Functional Limits (assessed functionally) LUE Assessment LUE Assessment: Within Functional Limits (assessed functionally)   See Function Navigator for Current Functional Status.  Leotis Shames [facility] 05/25/2017, 1:15 PM

## 2017-05-25 NOTE — Progress Notes (Signed)
[place] PHYSICAL MEDICINE & REHABILITATION     PROGRESS NOTE    Subjective/Complaints: Up with therapies no new problems  ROS: pt denies nausea, vomiting, diarrhea, cough, shortness of breath or chest pain   Objective: Vital Signs: Blood pressure 111/79, pulse 79, temperature 97.8 F (36.6 C), temperature source Oral, resp. rate 19, height 5\' 5"  (1.651 m), weight 96.4 kg (212 lb 8 oz), SpO2 99 %. No results found.  Recent Labs  05/25/17 0444  WBC 5.8  HGB 11.9*  HCT 37.4*  PLT 152   No results for input(s): NA, K, CL, GLUCOSE, BUN, CREATININE, CALCIUM in the last 72 hours.  Invalid input(s): CO CBG (last 3)  No results for input(s): GLUCAP in the last 72 hours.  Wt Readings from Last 3 Encounters:  05/20/17 96.4 kg (212 lb 8 oz)  05/14/17 100 kg (220 lb 7.4 oz)  05/14/17 100.3 kg (221 lb 3.2 oz)    Physical Exam:  Constitutional: He appears well-developedand well-nourished.  HENT:  Head: Normocephalicand atraumatic.  Mouth/Throat: Oropharynx is clear and moist.  Eyes: Pupils are equal, round, and reactive to light. Conjunctivaeand EOMare normal.  Neck: Normal range of motion. Neck supple.  Cardiovascular: RRR without murmur. No JVD    Respiratory. CTA Bilaterally without wheezes or rales. Normal effort  GI: Soft. Bowel sounds are normal.  Musculoskeletal: He exhibits no edemaor tenderness.  Neurological: He is alert. Processing delays Oriented to self and hospital. Moves all 4's. Impulsive. Fair trunk control.  Skin: Skin is warmand dry.  Psychiatric: flat  Assessment/Plan: 1. Functional deficits secondary to left MCA infarct which require 3+ hours per day of interdisciplinary therapy in a comprehensive inpatient rehab setting. Physiatrist is providing close team supervision and 24 hour management of active medical problems listed below. Physiatrist and rehab team continue to assess barriers to discharge/monitor patient progress toward functional and  medical goals.  Function:  Bathing Bathing position   Position: Shower  Bathing parts Body parts bathed by patient: Right arm, Left arm, Chest, Abdomen, Front perineal area, Right upper leg, Left upper leg, Buttocks, Right lower leg, Left lower leg, Back Body parts bathed by helper: Buttocks, Right lower leg, Left lower leg, Back  Bathing assist Assist Level: Supervision or verbal cues      Upper Body Dressing/Undressing Upper body dressing   What is the patient wearing?: Pull over shirt/dress     Pull over shirt/dress - Perfomed by patient: Thread/unthread right sleeve, Thread/unthread left sleeve, Put head through opening, Pull shirt over trunk          Upper body assist Assist Level: No help, No cues      Lower Body Dressing/Undressing Lower body dressing   What is the patient wearing?: Underwear, Pants, Socks, Shoes Underwear - Performed by patient: Thread/unthread right underwear leg, Thread/unthread left underwear leg, Pull underwear up/down   Pants- Performed by patient: Thread/unthread right pants leg, Thread/unthread left pants leg, Pull pants up/down   Non-skid slipper socks- Performed by patient: Don/doff right sock, Don/doff left sock   Socks - Performed by patient: Don/doff right sock, Don/doff left sock Socks - Performed by helper: Don/doff left sock Shoes - Performed by patient: Don/doff right shoe, Don/doff left shoe, Fasten right, Fasten left Shoes - Performed by helper: Fasten right, Fasten left          Lower body assist Assist for lower body dressing: Supervision or verbal cues      Toileting Toileting   Toileting steps completed by  patient: Adjust clothing prior to toileting, Performs perineal hygiene, Adjust clothing after toileting Toileting steps completed by helper: Adjust clothing prior to toileting, Performs perineal hygiene, Adjust clothing after toileting (per [place], [place])    Toileting assist Assist level: Supervision or verbal  cues   Transfers Chair/bed transfer   Chair/bed transfer method: Ambulatory Chair/bed transfer assist level: Supervision or verbal cues Chair/bed transfer assistive device: Armrests     Locomotion Ambulation     Max distance: 200 Assist level: Supervision or verbal cues   Wheelchair          Cognition Comprehension Comprehension assist level: Understands basic 25 - 49% of the time/ requires cueing 50 - 75% of the time, Understands basic 50 - 74% of the time/ requires cueing 25 - 49% of the time  Expression Expression assist level: Expresses basic 25 - 49% of the time/requires cueing 50 - 75% of the time. Uses single words/gestures.  Social Interaction Social Interaction assist level: Interacts appropriately 75 - 89% of the time - Needs redirection for appropriate language or to initiate interaction.  Problem Solving Problem solving assist level: Solves basic 25 - 49% of the time - needs direction more than half the time to initiate, plan or complete simple activities  Memory Memory assist level: Recognizes or recalls less than 25% of the time/requires cueing greater than 75% of the time   Medical Problem List and Plan: 1. Functional, language, and swallowing deficitssecondary to left MCA infarct -continue therapies 2. DVT Prophylaxis/Anticoagulation: Pharmaceutical: Lovenox 3. Pain Management: N/A 4. Mood: LCSW to follow for evaluation and support.  5. Neuropsych: This patient is notcapable of making decisions on hisown behalf. 6. Skin/Wound Care: routine pressure relief measures.  7. Fluids/Electrolytes/Nutrition: Monitor I/O. Labs reviewed. Hemolysis, some elevation of Cr.    -continue to encourage PO 8. COPD/Asthma: Will add albuterol nebs prn. Sister reports that he quit smoking a couple of months ago "with use of medication" 9. Major depressive disorder: On Wellbutrin XL, Celexa, Risperdal and Klonopin 10. H/o Chronic Hep C with hepatitis/Thrombocytopenia:    -platelets up to 152k today    - pt is ambulatory. Can dc lovenox  -recheck labs monday 11. Constipation:  bowel program.  12. Dysphagia: up to D2 and thins currently.    LOS (Days) 7 A FACE TO FACE EVALUATION WAS PERFORMED  Meredith Staggers, MD 05/25/2017 3:50 PM

## 2017-05-25 NOTE — Progress Notes (Signed)
Occupational Therapy Session Note  Patient Details  Name: Mario Liu MRN: [id number] Date of Birth: June 24, 1961  Today's Date: 05/25/2017 OT Individual Time: 1000-1100 OT Individual Time Calculation (min): 60 min    Short Term Goals: Week 1:  OT Short Term Goal 1 (Week 1): LTG=STG  Skilled Therapeutic Interventions/Progress Updates:    Pt asleep in bed upon arrival but easily aroused.  Pt ready to take a shower and amb in room without AD to gather clothing before walking into bathroom to use toilet and complete shower.  Pt completed shower while standing, including standing on one leg to wash feet.  Pt completed dressing tasks while standing, sitting only to don socks and shoes.  Pt completed grooming tasks while standing at sink.  Pt transitioned to functional amb without AD to gym and engaged in ongoing dynamic standing tasks on compliant and noncompliant surfaces.  Pt returned to room and remained in w/c with QRB in place and all needs within reach.    Therapy Documentation Precautions:  Precautions Precautions: Fall Restrictions Weight Bearing Restrictions: No Pain: Pain Assessment Pain Assessment: No/denies pain   Other Treatments:    See Function Navigator for Current Functional Status.   Therapy/Group: Individual Therapy  [name] 05/25/2017, 12:09 PM

## 2017-05-25 NOTE — Progress Notes (Signed)
Physical Therapy Discharge Summary  Patient Details  Name: Mario Liu MRN: [id number] Date of Birth: 05-01-1961  Today's Date: 05/25/2017 PT Individual Time: 1100-1130 PT Individual Time Calculation (min): 30 min    Patient has met 6 of 8 long term goals due to improved activity tolerance, improved balance, improved postural control, improved awareness and improved coordination.  Patient to discharge at an ambulatory level Supervision.   Patient's care partner is independent to provide the necessary physical and cognitive assistance at discharge.  Reasons goals not met: Pt able to navigate up/down 8 stairs. Has no stairs to navigate at home. Pt still requires cues for emerging awareness but has shown improvement. Pt has adequate assistance at home to be safe for discharge.   Recommendation:  Patient will benefit from ongoing skilled PT services in outpatient setting to continue to advance safe functional mobility, address ongoing impairments in balance, endurance, and minimize fall risk.  Equipment: No equipment provided  Reasons for discharge: treatment goals met or adequate for discharge   Patient/family agrees with progress made and goals achieved: Yes  PT Discharge Precautions/Restrictions Restrictions Weight Bearing Restrictions: No Pain Pain Assessment Pain Assessment: No/denies pain Vision/Perception  Vision - Assessment Additional Comments: decreased attention to right side   Cognition Orientation Level: Oriented X4 Safety/Judgment: Impaired (impulsive) Sensation Sensation Light Touch: Appears Intact Proprioception: Appears Intact Coordination Gross Motor Movements are Fluid and Coordinated: Yes Motor  Motor Motor: Within Functional Limits  Mobility Bed Mobility Rolling Right: 5: Supervision;6: Modified independent (Device/Increase time) Rolling Right Details: Other (comment) (no cues) Rolling Left: 5: Supervision;6: Modified independent (Device/Increase  time) Rolling Left Details: Other (comment) (no cues) Supine to Sit: 6: Modified independent (Device/Increase time) Supine to Sit Details: Other (Comment) (no cues) Sit to Supine: 6: Modified independent (Device/Increase time) Sit to Supine - Details: Other (Comment) (no cues) Transfers Transfers: Yes Sit to Stand: 5: Supervision Sit to Stand Details: Verbal cues for precautions/safety Stand to Sit: 5: Supervision Stand to Sit Details (indicate cue type and reason): Verbal cues for precautions/safety Stand Pivot Transfers: 5: Supervision Stand Pivot Transfer Details: Verbal cues for precautions/safety Locomotion  Ambulation Ambulation: Yes Ambulation/Gait Assistance: 5: Supervision Ambulation Distance (Feet): 200 Feet Assistive device: Rolling walker Ambulation/Gait Assistance Details: Verbal cues for precautions/safety Gait Gait: Yes Gait Pattern: Within Functional Limits Gait Pattern:  (slight lateral sway) Stairs / Additional Locomotion Stairs: Yes Stairs Assistance: 5: Supervision Stairs Assistance Details: Verbal cues for sequencing Stair Management Technique: Two rails Number of Stairs: 8 Height of Stairs: 6 Ramp: 5: Supervision Curb: 5: Supervision Wheelchair Mobility Wheelchair Mobility: No  Trunk/Postural Assessment  Cervical Assessment Cervical Assessment: Within Functional Limits Thoracic Assessment Thoracic Assessment: Within Functional Limits Lumbar Assessment Lumbar Assessment: Within Functional Limits Postural Control Postural Control: Within Functional Limits  Balance Berg Balance Test Sit to Stand: Able to stand  independently using hands Standing Unsupported: Able to stand safely 2 minutes Sitting with Back Unsupported but Feet Supported on Floor or Stool: Able to sit safely and securely 2 minutes Stand to Sit: Sits safely with minimal use of hands Transfers: Able to transfer safely, minor use of hands Standing Unsupported with Eyes Closed: Able  to stand 10 seconds safely Standing Ubsupported with Feet Together: Able to place feet together independently and stand 1 minute safely From Standing, Reach Forward with Outstretched Arm: Can reach confidently >25 cm (10") From Standing Position, Pick up Object from Floor: Able to pick up shoe safely and easily From Standing Position, Turn to Look Behind  Over each Shoulder: Turn sideways only but maintains balance Turn 360 Degrees: Able to turn 360 degrees safely in 4 seconds or less Standing Unsupported, Alternately Place Feet on Step/Stool: Able to stand independently and safely and complete 8 steps in 20 seconds Standing Unsupported, One Foot in Front: Able to plae foot ahead of the other independently and hold 30 seconds Standing on One Leg: Able to lift leg independently and hold 5-10 seconds Total Score: 51 Static Sitting Balance Static Sitting - Level of Assistance: 6: Modified independent (Device/Increase time) Dynamic Sitting Balance Dynamic Sitting - Balance Support: Feet supported Extremity Assessment  RUE Assessment RUE Assessment: Within Functional Limits (assessed functionally) LUE Assessment LUE Assessment: Within Functional Limits (assessed functionally) RLE Assessment RLE Assessment: Within Functional Limits (assessed functionally) LLE Assessment LLE Assessment: Within Functional Limits (assessed functionally)   Therapeutic Intervention Pt received sitting up in wheelchair, agreeable to therapy. Caregivers from group home present for family ed, actively asked questions and took notes. Educated on safety, proper supervision, and energy conservation throughout session. Pt ambulated 200 ft with supervision, cues for direction. Supervision with car transfer. Supervision for ambulating up ramp, across uneven ground, and down curb. Cues for sequencing curb. Pt mod I for all bed mobility. Navigated up/down 8, 6' stairs with 2 rails and supervision. Cues for sequencing. Pt  ambulated back to room as above. Left sitting up in wheelchair, all needs in reach, caregivers present.    See Function Navigator for Current Functional Status.  Gwinda Passe, SPT 05/25/2017, 11:57 AM

## 2017-05-25 NOTE — Discharge Summary (Signed)
Physician Discharge Summary  Patient ID: Mario Liu MRN: [id number] DOB/AGE: 54-Jul-1962 54 y.o.  Admit date: 05/18/2017 Discharge date: 05/26/2017  Discharge Diagnoses:  Principal Problem:   Acute ischemic left MCA stroke [facility]) Active Problems:   Down's syndrome   Major depressive disorder   Discharged Condition: stable   Significant Diagnostic Studies: N/A   Labs:  Basic Metabolic Panel:  Recent Labs Lab 05/21/17 0542  NA 135  K 3.9  CL 104  CO2 23  GLUCOSE 133*  BUN 10  CREATININE 0.93  CALCIUM 8.4*    CBC:  Recent Labs Lab 05/21/17 0542 05/25/17 0444  WBC 4.1 5.8  HGB 11.5* 11.9*  HCT 35.1* 37.4*  MCV 85.2 85.4  PLT 128* 152    CBG: No results for input(s): GLUCAP in the last 168 hours.  Brief HPI:   Mario Mittsis an 56 y.o.male with history of Down's syndrome (resident of a group home), MDD, COPD, who was admitted on 05/14/17 with mental status changes, right sided weakness and difficulty speaking. MRI brain revealed Left MCA infarct affecting lateral and posterior temporal lobe and extending into left parietal lobe.  CTA head showed left M2 branch occlusion. 2D echo with EF 55-60% with + bubble study consistent with PFO/ASD and hypermobile interatrial septum. BLE dopplers negative for DVT. Dr. Erlinda Hong recommended DAPT with ASA 325 mg/Plavix 75 mg X 3 months followed by plavix alone for embolic store of unknown etiology. He is not a good candidate for PFO closure and 30 day cardiac event monitor recommended. Patient with dysphagia with risk for aspiration, cognitive deficits, severe dysarthria, left gaze preference and unsteady gait. CIR recommended due to deficits in mobility and self-care tasks.    Hospital Course: Mario Liu was admitted to rehab 05/18/2017 for inpatient therapies to consist of PT, ST and OT at least three hours five days a week. Past admission physiatrist, therapy team and rehab RN have worked together to provide customized collaborative  inpatient rehab. Respiratory status has been stable on albuterol prn. Blood pressures were monitored on bid basis and have been stable. Po intake has been good with advancement of diet to dysphagia 2 diet but liquids limited to water only due to aspiration risk.  SCDs were used for DVT prophylaxis as patient ambulatory and due to thrombocytopenia. Follow up CBC showed that thrombocytopenia has resolved with mild drop in H/H. No signs of bleeding noted and will need repeat CBC in 1-2 weeks to monitor for recovery/stability. He has made good gain in rehab and is at supervision level.  He will continue to receive follow up outpatient PT, OT and ST at [facility] after discharge.    Rehab course: During patient's stay in rehab weekly team conference was held to monitor patient's progress, set goals and discuss barriers to discharge. At admission, patient required min assist with basic self-care tasks and mobility.  He displayed moderate oral dysphagia with moderate dysarthria and baseline moderate cognitive impairments per family.  He has had improvement in activity tolerance, balance, postural control, as well as ability to compensate for deficits. He has has had improvement in functional use LUE  and LE and is showing improvement in awareness.  His Berg balance score has improved to 51/56.  He is able to complete ADL task at modified independent level. He requires supervision for transfers and to ambulate 200 without AD. He needs supervision for safety awareness and to attend to right side. He is tolerating dysphagia 2 diet, water (no other  liquids) without S/S of aspiration. Speech intelligibility has improved and is currently at baseline and he is able initiate tasks with min cues.  Family education was completed with caregivers from residential home regarding all aspects of care and safety.    Disposition: Home.   Diet: Dysphagia 2--limit liquids to water only.   Special  Instructions: 1. Needs to follow up with cardiology for placement of loop recorder. 2. Perform oral care after meals.  3. Repeat CBC in 5-7 days and monitor for recovery.    Discharge Instructions    Ambulatory referral to Cardiology    Complete by:  As directed    Needs loop recorder for stroke work up   Ambulatory referral to Physical Medicine Rehab    Complete by:  As directed    1-2 weeks transitional care appt     Discharge Medication List as of 05/26/2017 12:59 PM    START taking these medications   Details  acetaminophen (TYLENOL) 325 MG tablet Take 1-2 tablets (325-650 mg total) by mouth every 4 (four) hours as needed for mild pain., Starting Mon 05/25/2017, OTC      CONTINUE these medications which have CHANGED   Details  atorvastatin (LIPITOR) 40 MG tablet Take 1 tablet (40 mg total) by mouth daily at 6 PM., Starting Tue 05/26/2017, Normal    clopidogrel (PLAVIX) 75 MG tablet Take 1 tablet (75 mg total) by mouth daily., Starting Tue 05/26/2017, Until Mon 08/24/2017, Normal    polyethylene glycol (MIRALAX / GLYCOLAX) packet Take 17 g by mouth daily as needed for moderate constipation., Starting Mon 05/25/2017, Normal    traZODone (DESYREL) 50 MG tablet Take 0.5-1 tablets (25-50 mg total) by mouth at bedtime as needed for sleep., Starting Mon 05/25/2017, No Print      CONTINUE these medications which have NOT CHANGED   Details  albuterol (PROVENTIL HFA;VENTOLIN HFA) 108 (90 Base) MCG/ACT inhaler Inhale 2 puffs into the lungs every 6 (six) hours as needed for wheezing or shortness of breath., Historical Med    alendronate (FOSAMAX) 70 MG tablet Take 70 mg by mouth once a week. Fridays, Historical Med    aspirin 325 MG tablet Take 1 tablet (325 mg total) by mouth daily., Starting Mon 05/18/2017, Until Sun 08/16/2017, Normal    buPROPion (WELLBUTRIN XL) 150 MG 24 hr tablet Take 150 mg by mouth daily. , Historical Med    calcium carbonate (OS-CAL) 600 MG TABS tablet Take 600 mg  by mouth 2 (two) times daily with a meal. , Historical Med    cetirizine (ZYRTEC) 10 MG tablet Take 10 mg by mouth daily., Historical Med    citalopram (CELEXA) 40 MG tablet Take 40 mg by mouth daily. , Historical Med    clonazePAM (KLONOPIN) 0.5 MG tablet Take 0.5 mg by mouth daily. *may also take an additional 0.5mg  tab as needed for anxiety/agitation, Historical Med    fluticasone (FLONASE) 50 MCG/ACT nasal spray Place 2 sprays into the nose daily as needed for allergies. , Historical Med    potassium chloride (K-DUR) 10 MEQ tablet Take 10 mEq by mouth daily. , Historical Med    risperiDONE (RISPERDAL) 3 MG tablet Take 3 mg by mouth 2 (two) times daily., Historical Med      STOP taking these medications     Cholecalciferol (VITAMIN D3) 50000 units CAPS      furosemide (LASIX) 40 MG tablet      meloxicam (MOBIC) 15 MG tablet      senna-docusate (  SENOKOT-S) 8.6-50 MG tablet        Follow-up Information    [name], FNP. Go on 06/03/2017.   Specialty:  Family Medicine Why:  @ 9:30AM Contact information: [place] [address] [phone]        Meredith Staggers, MD Follow up.   Specialty:  Physical Medicine and Rehabilitation Why:  Office will call with follow up appointment Contact information: [address] [place] [address] [phone]        Rosalin Hawking, MD. Call in 1 day(s).   Specialty:  Neurology Why:  for follow up appointment in 4 weeks Contact information: [address] [place] [address] [phone]           Signed: Bary Leriche 05/26/2017, 6:20 PM

## 2017-05-25 NOTE — Progress Notes (Signed)
Occupational Therapy Note  Patient Details  Name: Mario Liu MRN: [id number] Date of Birth: November 02, 1960  Today's Date: 05/25/2017 OT Individual Time: 1300-1330 OT Individual Time Calculation (min): 30 min   Pt denies pain Individual Therapy  Pt resting in w/c upon arrival.  Administered BERG with improvement from 45 to 51.  Pt also engaged in functional amb without AD in room to gather clothing and straighten up room. Pt remained in w/c with QRB in place and all needs within reach.    Leotis Shames [facility] 05/25/2017, 1:30 PM

## 2017-05-25 NOTE — Progress Notes (Signed)
Speech Language Pathology Discharge Summary  Patient Details  Name: Mario Liu MRN: [id number] Date of Birth: 1961-06-26  Today's Date: 05/25/2017 SLP Individual Time: 1130-1215 SLP Individual Time Calculation (min): 45 min   Skilled Therapeutic Interventions:  Skilled treatment session focused on completion of caregiver education with caregivers from pt's residential home. Education provided on current diet recommendations, handout given and all questions answered to their satisfaction.      Patient has met 4 of 4 long term goals.  Patient to discharge at [facility] level.  Reasons goals not met:     Clinical Impression/Discharge Summary:  Pt has made improvement during skilled ST and as a result he is on dysphagia 2 with water only and speech is almost at baseline during rote utterances.    Care Partner:  Caregiver Able to Provide Assistance: Yes  Type of Caregiver Assistance: Physical;Cognitive  Recommendation:  24 hour supervision/assistance  Rationale for SLP Follow Up: Maximize functional communication;Maximize swallowing safety;Maximize cognitive function and independence   Equipment:     Reasons for discharge: Treatment goals met   Patient/Family Agrees with Progress Made and Goals Achieved: Yes   Function:  Eating Eating   Modified Consistency Diet: Yes Eating Assist Level: Supervision or verbal cues;Set up assist for   Eating Set Up Assist For: Opening containers       Cognition Comprehension Comprehension assist level: Understands basic 25 - 49% of the time/ requires cueing 50 - 75% of the time;Understands basic 50 - 74% of the time/ requires cueing 25 - 49% of the time  Expression   Expression assist level: Expresses basic 25 - 49% of the time/requires cueing 50 - 75% of the time. Uses single words/gestures.  Social Interaction Social Interaction assist level: Interacts appropriately 75 - 89% of the time - Needs redirection for appropriate language or to  initiate interaction.  Problem Solving Problem solving assist level: Solves basic 25 - 49% of the time - needs direction more than half the time to initiate, plan or complete simple activities  Memory Memory assist level: Recognizes or recalls less than 25% of the time/requires cueing greater than 75% of the time   Aniello Christopoulos 05/25/2017, 2:20 PM

## 2017-05-26 MED ORDER — CLOPIDOGREL BISULFATE 75 MG PO TABS: 75.0000 mg | ORAL_TABLET | Freq: Every day | ORAL | 0 refills | Status: DC

## 2017-05-26 MED ORDER — ATORVASTATIN CALCIUM 40 MG PO TABS: 40.0000 mg | ORAL_TABLET | Freq: Every day | ORAL | 0 refills | Status: DC

## 2017-05-26 NOTE — Progress Notes (Signed)
Pt discharged to [facility] pt in prior to admission. VSS, denies pain. Attendant verbalizes D/C instructions given by Algis Liming, PAC.

## 2017-05-26 NOTE — Progress Notes (Signed)
Social Work Discharge Note  The overall goal for the admission was met for:   Discharge location: Yes - back to group home where he resided PTA  Length of Stay: Yes - 8 days  Discharge activity level: Yes - supervision  Home/community participation: Yes  Services provided included: MD, RD, PT, OT, SLP, RN, TR, Pharmacy and SW  Financial Services: Medicare and Medicaid  Follow-up services arranged: Outpatient: PT/OT/ST at [facility] and Patient/Family has no preference for HH/DME agencies  Comments (or additional information): Pt's sister and group home staff were both made aware of d/c needs.  Pt's group home staff came for family education prior to pt's d/c.  Pt did not need any DME and outpt PT/OT/ST was arranged.   Patient/Family verbalized understanding of follow-up arrangements: Yes  Individual responsible for coordination of the follow-up plan: Group Home Staff and pt's sister/POA - Ms. Sharon Moore  Confirmed correct DME delivered: ,  [facility] 05/26/2017    ,  [facility] 

## 2017-05-26 NOTE — Progress Notes (Signed)
[facility] PHYSICAL MEDICINE & REHABILITATION     PROGRESS NOTE    Subjective/Complaints:  No new complaints  ROS: Limited due to cognitive/behavioral   Objective: Vital Signs: Blood pressure 101/65, pulse 78, temperature 97.9 F (36.6 C), temperature source Oral, resp. rate 18, height 5\' 5"  (1.651 m), weight 96.4 kg (212 lb 8 oz), SpO2 98 %. No results found.  Recent Labs  05/25/17 0444  WBC 5.8  HGB 11.9*  HCT 37.4*  PLT 152   No results for input(s): NA, K, CL, GLUCOSE, BUN, CREATININE, CALCIUM in the last 72 hours.  Invalid input(s): CO CBG (last 3)  No results for input(s): GLUCAP in the last 72 hours.  Wt Readings from Last 3 Encounters:  05/20/17 96.4 kg (212 lb 8 oz)  05/14/17 100 kg (220 lb 7.4 oz)  05/14/17 100.3 kg (221 lb 3.2 oz)    Physical Exam:  Constitutional: He appears well-developedand well-nourished.  HENT:  Head: Normocephalicand atraumatic.  Mouth/Throat: Oropharynx is clear and moist.  Eyes: Pupils are equal, round, and reactive to light. Conjunctivaeand EOMare normal.  Neck: Normal range of motion. Neck supple.  Cardiovascular: RRR without murmur. No JVD     Respiratory. CTA Bilaterally without wheezes or rales. Normal effort   GI: Soft. Bowel sounds are normal.  Musculoskeletal: He exhibits no edemaor tenderness.  Neurological: He is alert. Processing delays Oriented to self and hospital. Moves all 4's. Impulsive. Fair trunk control.  Skin: Skin is warmand dry.  Psychiatric: flat  Assessment/Plan: 1. Functional deficits secondary to left MCA infarct which require 3+ hours per day of interdisciplinary therapy in a comprehensive inpatient rehab setting. Physiatrist is providing close team supervision and 24 hour management of active medical problems listed below. Physiatrist and rehab team continue to assess barriers to discharge/monitor patient progress toward functional and medical goals.  Function:  Bathing Bathing  position   Position: Shower  Bathing parts Body parts bathed by patient: Right arm, Left arm, Chest, Abdomen, Front perineal area, Right upper leg, Left upper leg, Buttocks, Right lower leg, Left lower leg, Back Body parts bathed by helper: Buttocks, Right lower leg, Left lower leg, Back  Bathing assist Assist Level: Supervision or verbal cues      Upper Body Dressing/Undressing Upper body dressing   What is the patient wearing?: Pull over shirt/dress     Pull over shirt/dress - Perfomed by patient: Thread/unthread right sleeve, Thread/unthread left sleeve, Put head through opening, Pull shirt over trunk          Upper body assist Assist Level: No help, No cues      Lower Body Dressing/Undressing Lower body dressing   What is the patient wearing?: Underwear, Pants, Socks, Shoes Underwear - Performed by patient: Thread/unthread right underwear leg, Thread/unthread left underwear leg, Pull underwear up/down   Pants- Performed by patient: Thread/unthread right pants leg, Thread/unthread left pants leg, Pull pants up/down   Non-skid slipper socks- Performed by patient: Don/doff right sock, Don/doff left sock   Socks - Performed by patient: Don/doff right sock, Don/doff left sock Socks - Performed by helper: Don/doff left sock Shoes - Performed by patient: Don/doff right shoe, Don/doff left shoe, Fasten right, Fasten left Shoes - Performed by helper: Fasten right, Fasten left          Lower body assist Assist for lower body dressing: Supervision or verbal cues      Toileting Toileting   Toileting steps completed by patient: Adjust clothing prior to toileting, Performs perineal  hygiene, Adjust clothing after toileting Toileting steps completed by helper: Adjust clothing prior to toileting, Performs perineal hygiene, Adjust clothing after toileting (per [place], [place])    Toileting assist Assist level: Supervision or verbal cues   Transfers Chair/bed transfer    Chair/bed transfer method: Ambulatory Chair/bed transfer assist level: Supervision or verbal cues Chair/bed transfer assistive device: Armrests     Locomotion Ambulation     Max distance: 200 Assist level: Supervision or verbal cues   Wheelchair          Cognition Comprehension Comprehension assist level: Understands basic 25 - 49% of the time/ requires cueing 50 - 75% of the time, Understands basic 50 - 74% of the time/ requires cueing 25 - 49% of the time  Expression Expression assist level: Expresses basic 25 - 49% of the time/requires cueing 50 - 75% of the time. Uses single words/gestures.  Social Interaction Social Interaction assist level: Interacts appropriately 75 - 89% of the time - Needs redirection for appropriate language or to initiate interaction.  Problem Solving Problem solving assist level: Solves basic 25 - 49% of the time - needs direction more than half the time to initiate, plan or complete simple activities  Memory Memory assist level: Recognizes or recalls 25 - 49% of the time/requires cueing 50 - 75% of the time   Medical Problem List and Plan: 1. Functional, language, and swallowing deficitssecondary to left MCA infarct -continue therapies  -dc home today     -can follow up with me in 1 month at office 2. DVT Prophylaxis/Anticoagulation: Pharmaceutical: Lovenox 3. Pain Management: N/A 4. Mood: LCSW to follow for evaluation and support.  5. Neuropsych: This patient is notcapable of making decisions on hisown behalf. 6. Skin/Wound Care: routine pressure relief measures.  7. Fluids/Electrolytes/Nutrition: Monitor I/O. Labs reviewed. Hemolysis, some elevation of Cr.    -continue to encourage PO 8. COPD/Asthma: Will add albuterol nebs prn. Sister reports that he quit smoking a couple of months ago "with use of medication" 9. Major depressive disorder: On Wellbutrin XL, Celexa, Risperdal and Klonopin 10. H/o Chronic Hep C with  hepatitis/Thrombocytopenia:   -platelets up to 152k   - pt is ambulatory.  11. Constipation:  bowel program.  12. Dysphagia: up to D2 and thins currently.    LOS (Days) 8 A FACE TO FACE EVALUATION WAS PERFORMED  Meredith Staggers, MD 05/26/2017 10:16 AM

## 2017-05-26 NOTE — NC FL2 (Signed)
[place] LEVEL OF CARE SCREENING TOOL     IDENTIFICATION  Patient Name: Mario Liu Birthdate: July 08, 1961 Sex: male Admission Date (Current Location): 05/18/2017  [place] and [place] Number:  Kathleen Argue [id number] [place] and Address:  [facility] [place]. [facility], [place] [address], [place], [place] [address]      Provider Number: [id number]  Attending Physician Name and Address:  Meredith Staggers, MD  Relative Name and Phone Number:       Current Level of Care: Hospital Recommended Level of Care: [facility] [place] Prior Approval Number:    Date Approved/Denied:   PASRR Number:    Discharge Plan: Other (Comment) (Group Home)    Current Diagnoses: Patient Active Problem List   Diagnosis Date Noted  . Major depressive disorder   . Hepatitis C antibody test positive 05/16/2017  . Acute ischemic left MCA stroke ([place])   . Down's syndrome   . PFO with atrial septal aneurysm     Orientation RESPIRATION BLADDER Height & Weight     Self, Situation, Place  Normal Continent Weight: 96.4 kg (212 lb 8 oz) Height:  5\' 5"  (165.1 cm)  BEHAVIORAL SYMPTOMS/MOOD NEUROLOGICAL BOWEL NUTRITION STATUS      Continent Diet (dysphagia 2 with water only)  AMBULATORY STATUS COMMUNICATION OF NEEDS Skin   Supervision Verbally (sometimes difficult to understand) Normal                       Personal Care Assistance Level of Assistance  Bathing, Feeding, Dressing Bathing Assistance:  (supervision) Feeding assistance:  (supervision) Dressing Assistance:  (supervision)     Functional Limitations Info  Speech     Speech Info: Impaired    SPECIAL CARE FACTORS FREQUENCY  PT (By licensed PT), OT (By licensed OT), Speech therapy     PT Frequency: TBD by therapist at [place] OT Frequency: TBD by therapist at [place]     Speech Therapy Frequency: TBD by therapist at [place]       Contractures Contractures Info: Not present    Additional Factors Info  Allergies   Allergies Info: no known allergies           Current Medications (05/26/2017):  This is the current hospital active medication list Current Facility-Administered Medications  Medication Dose Route Frequency Provider Last Rate Last Dose  . acetaminophen (TYLENOL) tablet 325-650 mg  325-650 mg Oral Q4H PRN Bary Leriche, PA-C   650 mg at 05/24/17 1739  . alum & mag hydroxide-simeth (MAALOX/MYLANTA) 200-200-20 MG/5ML suspension 30 mL  30 mL Oral Q4H PRN Love, Pamela S, PA-C      . aspirin EC tablet 325 mg  325 mg Oral Daily Bary Leriche, PA-C   325 mg at 05/26/17 0813  . atorvastatin (LIPITOR) tablet 40 mg  40 mg Oral q1800 Bary Leriche, PA-C   40 mg at 05/25/17 1741  . bisacodyl (DULCOLAX) suppository 10 mg  10 mg Rectal Daily PRN Bary Leriche, PA-C   10 mg at 05/24/17 1739  . buPROPion (WELLBUTRIN XL) 24 hr tablet 150 mg  150 mg Oral Daily Bary Leriche, PA-C   150 mg at 05/26/17 0086  . citalopram (CELEXA) tablet 40 mg  40 mg Oral Daily Bary Leriche, PA-C   40 mg at 05/26/17 0809  . clonazePAM (KLONOPIN) tablet 0.5 mg  0.5 mg Oral Daily Love, Ivan Anchors, PA-C   0.5  mg at 05/26/17 0809  . clopidogrel (PLAVIX) tablet 75 mg  75 mg Oral Daily Bary Leriche, PA-C   75 mg at 05/26/17 0809  . diphenhydrAMINE (BENADRYL) 12.5 MG/5ML elixir 12.5-25 mg  12.5-25 mg Oral Q6H PRN Love, Pamela S, PA-C      . enoxaparin (LOVENOX) injection 40 mg  40 mg Subcutaneous Q24H Love, Pamela S, PA-C   40 mg at 05/25/17 1558  . fluticasone (FLONASE) 50 MCG/ACT nasal spray 2 spray  2 spray Each Nare Daily PRN Love, Pamela S, PA-C      . guaiFENesin-dextromethorphan (ROBITUSSIN DM) 100-10 MG/5ML syrup 5-10 mL  5-10 mL Oral Q6H PRN Love, Pamela S, PA-C      . loratadine (CLARITIN) tablet 10 mg  10 mg Oral Daily Bary Leriche, PA-C   10 mg at 05/26/17 0813  . magic mouthwash  5 mL Oral TID Bary Leriche, PA-C   5 mL at  05/26/17 0814  . polyethylene glycol (MIRALAX / GLYCOLAX) packet 17 g  17 g Oral Daily PRN Bary Leriche, PA-C   17 g at 05/21/17 2036  . potassium chloride (K-DUR,KLOR-CON) CR tablet 10 mEq  10 mEq Oral Daily Bary Leriche, PA-C   10 mEq at 05/26/17 0263  . prochlorperazine (COMPAZINE) tablet 5-10 mg  5-10 mg Oral Q6H PRN Love, Pamela S, PA-C       Or  . prochlorperazine (COMPAZINE) injection 5-10 mg  5-10 mg Intramuscular Q6H PRN Love, Pamela S, PA-C       Or  . prochlorperazine (COMPAZINE) suppository 12.5 mg  12.5 mg Rectal Q6H PRN Love, Pamela S, PA-C      . risperiDONE (RISPERDAL) tablet 3 mg  3 mg Oral BID Love, Pamela S, PA-C   3 mg at 05/26/17 0809  . sodium phosphate (FLEET) 7-19 GM/118ML enema 1 enema  1 enema Rectal Once PRN Love, Pamela S, PA-C      . traZODone (DESYREL) tablet 25-50 mg  25-50 mg Oral QHS PRN Bary Leriche, PA-C         Discharge Medications: Please see discharge summary for a list of discharge medications.  Relevant Imaging Results:  Relevant Lab Results:   Additional Information    Prima Rayner, Silvestre Mesi, LCSW

## 2017-05-26 NOTE — Discharge Instructions (Signed)
Inpatient Rehab Discharge Instructions  Mario Liu Discharge date and time: 05/26/17    Activities/Precautions/ Functional Status: Activity: no lifting, driving, or strenuous exercise  till cleared by MD.  Diet:  Chopped foods. Liquids limited to water only.  Wound Care: none needed   Functional status:  ___ No restrictions     ___ Walk up steps independently _X__ 24/7 supervision/assistance   ___ Walk up steps with assistance ___ Intermittent supervision/assistance  ___ Bathe/dress independently ___ Walk with walker     _X__ Bathe/dress with assistance ___ Walk Independently    ___ Shower independently ___ Walk with assistance    ___ Shower with assistance _X__ No alcohol     ___ Return to work/school ________  Special Instructions: 1. Needs 24 hours supervision.  2. Can only have water to drink--no other liquids.  3. Needs supervision at meals--monitor for pocketing. Perform oral care after meals.    COMMUNITY REFERRALS UPON DISCHARGE:   Outpatient: PT     OT     ST  Agency:  [facility]                             [place]  [address]             Phone:    [phone]  Appointment Date/Time:  Physical Therapy evaluation on Thursday, September 13th at [facility].  Then, they will schedule Occupational and Speech therapy appointments. Medical Equipment/Items Ordered:  No recommended equipment.    STROKE/TIA DISCHARGE INSTRUCTIONS SMOKING Cigarette smoking nearly doubles your risk of having a stroke & is the single most alterable risk factor  If you smoke or have smoked in the last 12 months, you are advised to quit smoking for your health.  Most of the excess cardiovascular risk related to smoking disappears within a year of stopping.  Ask you doctor about anti-smoking medications  [place] Quit Line: 1-800-QUIT NOW  Free Smoking Cessation Classes [phone]  CHOLESTEROL Know your levels;  limit fat & cholesterol in your diet  Lipid Panel     Component Value Date/Time   CHOL 138 05/15/2017 0433   TRIG 125 05/15/2017 0433   HDL 33 (L) 05/15/2017 0433   CHOLHDL 4.2 05/15/2017 0433   VLDL 25 05/15/2017 0433   LDLCALC 80 05/15/2017 0433      Many patients benefit from treatment even if their cholesterol is at goal.  Goal: Total Cholesterol (CHOL) less than 160  Goal:  Triglycerides (TRIG) less than 150  Goal:  HDL greater than 40  Goal:  LDL (LDLCALC) less than 100   BLOOD PRESSURE American Stroke Association blood pressure target is less that 120/80 mm/Hg  Your discharge blood pressure is:  BP: 101/65  Monitor your blood pressure  Limit your salt and alcohol intake  Many individuals will require more than one medication for high blood pressure  DIABETES (A1c is a blood sugar average for last 3 months) Goal HGBA1c is under 7% (HBGA1c is blood sugar average for last 3 months)  Diabetes: No known diagnosis of diabetes    Lab Results  Component Value Date   HGBA1C 5.9 (H) 05/15/2017     Your HGBA1c can be lowered with medications, healthy diet, and exercise.  Check your blood sugar as directed by your physician  Call your physician if you experience unexplained or low blood sugars.  PHYSICAL ACTIVITY/REHABILITATION Goal is 30 minutes at least 4 days per week  Activity: No driving, Therapies: see above Return to work: N/A  Activity decreases your risk of heart attack and stroke and makes your heart stronger.  It helps control your weight and blood pressure; helps you relax and can improve your mood.  Participate in a regular exercise program.  Talk with your doctor about the best form of exercise for you (dancing, walking, swimming, cycling).  DIET/WEIGHT Goal is to maintain a healthy weight  Your discharge diet is: DIET DYS 2 Room service appropriate? Yes; Fluid consistency: Thin liquids Your height is:  Height: 5\' 5"  (165.1 cm) Your current weight is:  Weight: 96.4 kg (212 lb 8 oz) Your Body Mass Index (BMI) is:  BMI (Calculated): 35.36  Following the type of diet specifically designed for you will help prevent another stroke.  Your goal weight  is: 150 lbs  Your goal Body Mass Index (BMI) is 19-24.  Healthy food habits can help reduce 3 risk factors for stroke:  High cholesterol, hypertension, and excess weight.  RESOURCES Stroke/Support Group:  Call [phone]   STROKE EDUCATION PROVIDED/REVIEWED AND GIVEN TO PATIENT Stroke warning signs and symptoms How to activate emergency medical system (call 911). Medications prescribed at discharge. Need for follow-up after discharge. Personal risk factors for stroke. Pneumonia vaccine given:  Flu vaccine given:  My questions have been answered, the writing is legible, and I understand these instructions.  I will adhere to these goals & educational materials that have been provided to me after my discharge from the hospital.     My questions have been answered and I understand these instructions. I will adhere to these goals and the provided educational materials after my discharge from the hospital.  Patient/Caregiver Signature _______________________________ Date __________  Clinician Signature _______________________________________ Date __________  Please bring this form and your medication list with you to all your follow-up doctor's appointments.

## 2017-05-28 ENCOUNTER — Ambulatory Visit: Payer: Medicare Other | Attending: Physical Medicine and Rehabilitation | Admitting: Physical Therapy

## 2017-05-28 ENCOUNTER — Encounter: Payer: Self-pay | Admitting: Physical Therapy

## 2017-05-28 VITALS — BP 101/72

## 2017-05-28 DIAGNOSIS — R471 Dysarthria and anarthria: Secondary | ICD-10-CM | POA: Diagnosis present

## 2017-05-28 DIAGNOSIS — R2681 Unsteadiness on feet: Secondary | ICD-10-CM

## 2017-05-28 DIAGNOSIS — M6281 Muscle weakness (generalized): Secondary | ICD-10-CM

## 2017-05-28 DIAGNOSIS — R278 Other lack of coordination: Secondary | ICD-10-CM | POA: Diagnosis present

## 2017-05-28 DIAGNOSIS — R4701 Aphasia: Secondary | ICD-10-CM | POA: Insufficient documentation

## 2017-05-28 NOTE — Therapy (Signed)
[place] MAIN [facility] SERVICES [address] [place], [place], [address] Phone: [phone]   Fax:  [phone]  Physical Therapy Evaluation  Patient Details  Name: Mario Liu MRN: [id number] Date of Birth: 01/29/1961 Referring Provider: Bary Leriche  Encounter Date: 05/28/2017      PT End of Session - 05/28/17 1415    Visit Number 1   Number of Visits 25   Date for PT Re-Evaluation 08/20/17   Authorization Type 1/10 g code   PT Start Time 0110   PT Stop Time 0158   PT Time Calculation (min) 48 min   Equipment Utilized During Treatment Gait belt   Activity Tolerance Patient tolerated treatment well;Patient limited by fatigue   Behavior During Therapy [facility] for tasks assessed/performed      Past Medical History:  Diagnosis Date  . COPD (chronic obstructive pulmonary disease) ([place])   . Hepatitis C   . Hx of cholecystectomy   . Hypothyroidism   . Kidney cysts   . Major depressive disorder    Was treated at facility in [place] ([facility]) years ago  . Tobacco abuse   . Vitamin D deficiency     Past Surgical History:  Procedure Laterality Date  . LAPAROSCOPIC CHOLECYSTECTOMY      Vitals:   05/28/17 1317  BP: 101/72         Subjective Assessment - 05/28/17 1320    Subjective Group home administrator states that he is having weakness of his arms and legs, decrease balance and ability to perform daily tasks and group home activities that he previously enjoyed.  Pt also has issues w/speech.  Group home adminitrator states that she has noticed a decrease in walking speed and endurance, and also stated that he spends more timing sitting than he used to prior his hospital and inpatient rehab stay.   Patient is accompained by: --  Group home administrator    Pertinent History Pt is a 56 yo w/Down syndrome w/CVA on 05/14/17 resulting in weakness of R UE and LE and mild weakness of L UE and LE.  Pt also had dysphagia prior to stroke and  is own dyspagia 2 diet and thickened liquids due to risk of aspiration.  Pt has hx of    Limitations Standing;Walking   How long can you sit comfortably? unlimited   How long can you stand comfortably? 30 minutes or less before needing to rest   How long can you walk comfortably? 10 minutes before needing to rest   Patient Stated Goals "better balance"    Currently in Pain? No/denies      PAIN: no pain noted today  POSTURE: WFL  PROM/AROM: WFL B UE and LE  STRENGTH:  Graded on a 0-5 scale Muscle Group Left Right  Shoulder flex 3/5 3+/5  Shoulder Abd 3/5 3+/5          Elbow 3/5 3+/5      Hip Flex 3/5 3+/5  Hip Abd 3/5 3/5  Hip Add 3/5 3/5  Hip Ext    Hip IR/ER    Knee Flex 3+/5 3+/5  Knee Ext 3/5 3+/5  Ankle DF 3+/5 3+/5  Ankle PF     SENSATION: Intact on both sides   Coordination: decreased accuracy w/B UE's when moving fast    FUNCTIONAL MOBILITY: WFL   BALANCE: Tandem stance: 15 seconds SLS: 3 seconds L LE, 2 seconds R LE  GAIT: Pt ambulates w/o AD.  Decreased heel  strike on B LE's, wide BOS, decreased B arm swing and decreased trunk rotation.  Pt has decreased safety awareness when ambulating and requires supervision.  OUTCOME MEASURES: TEST Outcome Interpretation  5 times sit<>stand/ 19 sec >60 yo, >15 sec indicates increased risk for falls  10 meter walk test      0.71    m/s <1.0 m/s indicates increased risk for falls; limited community ambulator  Timed up and Go         17        sec <14 sec indicates increased risk for falls  6 minute walk test                Feet 1000 feet is community Water quality scientist 36/56 <36/56 (100% risk for falls), 37-45 (80% risk for falls); 46-51 (>50% risk for falls); 52-55 (lower risk <25% of falls)   Standing Dynamic Balance  Normal Stand independently unsupported, able to weight shift and cross midline maximally   Good Stand independently unsupported, able to weight shift and cross midline moderately    Good-/Fair+ Stand independently unsupported, able to weight shift across midline minimally   Fair Stand independently unsupported, weight shift, and reach ipsilaterally, loss of balance when crossing midline X  Poor+ Able to stand with Min A and reach ipsilaterally, unable to weight shift   Poor Able to stand with Mod A and minimally reach ipsilaterally, unable to cross midline.     Static Standing Balance  Normal Able to maintain standing balance against maximal resistance   Good Able to maintain standing balance against moderate resistance   Good-/Fair+ Able to maintain standing balance against minimal resistance   Fair Able to stand unsupported without UE support and without LOB for 1-2 min X  Fair- Requires Min A and UE support to maintain standing without loss of balance   Poor+ Requires mod A and UE support to maintain standing without loss of balance   Poor Requires max A and UE support to maintain standing balance without loss            [facility] PT Assessment - 05/28/17 1325      Assessment   Medical Diagnosis CVA   Referring Provider LOVE, PAMELA S   Onset Date/Surgical Date 05/14/17   Hand Dominance Right   Prior Therapy yes      Precautions   Precautions None     Restrictions   Weight Bearing Restrictions No     Balance Screen   Has the patient fallen in the past 6 months No   Has the patient had a decrease in activity level because of a fear of falling?  Yes   Is the patient reluctant to leave their home because of a fear of falling?  No     Home Environment   Living Environment Group home   Additional Comments uses stairs sometimes to enter home w/handrail on R     Prior Function   Level of Independence Independent with basic ADLs   Leisure watch TV; participate in group home activities             Objective measurements completed on examination: See above findings.                    PT Short Term Goals - 05/28/17 1426      PT  SHORT TERM GOAL #1   Title Pt will demonstrate independence w/HEP in order to increase strength and balance to  reduce overall risk of falls.   Time 6   Period Weeks   Status New   Target Date 07/09/17     PT SHORT TERM GOAL #2   Title Pt will improve decrease 5 time sit to stand to <17 seconds in order to demonstrate increased LE strength and balance.   Baseline 19 sec   Time 6   Period Weeks   Status New   Target Date 07/09/17     PT SHORT TERM GOAL #3   Title Pt will improve gait speed measured by 10 m WT to 0.8 m/s to demonstrate increased LE strength and functional mobility in order to decrease risk of falls and increase participation in group home activities.   Baseline 0.71 m/s   Time 6   Period Weeks   Status New   Target Date 07/09/17     PT SHORT TERM GOAL #4   Title Pt will improve TUG to < 15 seconds in order to demonstrate improved balance and functional activity tolerance.   Baseline 17 sec   Time 6   Period Weeks   Status New   Target Date 07/09/17     PT SHORT TERM GOAL #5   Title Pt will improve Berg Balance score by at least 4 points in order to demonstrate improved dynamic and static, reducing overall falls risk.   Baseline 36/56   Time 6   Period Weeks   Status New   Target Date 07/09/17           PT Long Term Goals - 05/28/17 1432      PT LONG TERM GOAL #1   Title Pt will improve B LE strength to at least 4/5 in all limited planes in order to increase funtional activity tolerance and return to group home activities.    Baseline gross strength assessment: 3/5   Time 12   Period Weeks   Status New   Target Date 08/20/17     PT LONG TERM GOAL #2   Title Pt will improve 5 time sit to stand to < 15 seconds in order to demonstrate increase in LE strength and balance.   Baseline 19 sec   Time 12   Period Weeks   Status New   Target Date 07/09/17     PT LONG TERM GOAL #3   Title Pt will incrase gait speed measured by 10 m WT to at least 1.0  m/s in order to demonstrate improved balance and LE strength and to decrease overall risk for falls.   Baseline 0.71 m/s   Time 12   Period Weeks   Status New   Target Date 08/20/17     PT LONG TERM GOAL #4   Title Pt will improve TUG to < 13 seconds in order to demonstrate an increase in LE strength and improved dynamic balance.   Baseline 17 seconds   Time 12   Period Weeks   Status New   Target Date 08/20/17     PT LONG TERM GOAL #5   Title Pt will demonstrate an increase in Berg Balance Assessment by at least 10 points in order to demonstrate improved dynamic and static balance, reducing overall risk of falls.   Baseline 36/56   Time 12   Period Weeks   Status New   Target Date 08/20/17                Plan - 05/28/17 1416    Clinical Impression Statement Pt  is presents w/decreased B UE and LE strength, balance, activity tolerance, and safety awareness.  Pt requires increased verbal, visual and tactile cues to complete tasks and is easily distracted.  Pt able to ambulate w/o AD and presents w/wide BOS, decreased heel strike, trunk rotation and UE swing.  Pt would benefit from skilled PT services in order to adress his decreased strength, endurance and balance.   History and Personal Factors relevant to plan of care: Down syndrome, fall risk   Clinical Presentation Evolving   Clinical Presentation due to: CVA, fall risk, hx of COPD, dizziness when changing position    Clinical Decision Making Moderate   Rehab Potential Good   Clinical Impairments Affecting Rehab Potential transportation limitations, living situation   PT Frequency 2x / week   PT Duration 12 weeks   PT Treatment/Interventions Cryotherapy;Moist Heat;Gait training;Therapeutic activities;Therapeutic exercise;Balance training;Neuromuscular re-education;Patient/family education;Manual techniques;Passive range of motion;Dry needling   PT Next Visit Plan 6 MWT to assess endurance; begin strength and balance  exercises    PT Home Exercise Plan begin HEP next session    Consulted and Agree with Plan of Care Patient; Group Home administrator      Patient will benefit from skilled therapeutic intervention in order to improve the following deficits and impairments:  Abnormal gait, Decreased activity tolerance, Decreased balance, Decreased coordination, Decreased endurance, Decreased mobility, Decreased safety awareness, Decreased strength, Difficulty walking  Visit Diagnosis: Muscle weakness (generalized)  Unsteadiness on feet      G-Codes - 2017-06-06 1440    Functional Assessment Tool Used (Outpatient Only) Berg Balance Scale, 5x sit to stand, TUG, 24m WT, clinical judgement   Functional Limitation Mobility: Walking and moving around   Mobility: Walking and Moving Around Current Status [phone]) At least 60 percent but less than 80 percent impaired, limited or restricted   Mobility: Walking and Moving Around Goal Status [id number]) At least 40 percent but less than 60 percent impaired, limited or restricted       Problem List Patient Active Problem List   Diagnosis Date Noted  . Major depressive disorder   . Hepatitis C antibody test positive 05/16/2017  . Acute ischemic left MCA stroke ([place])   . Down's syndrome   . PFO with atrial septal aneurysm    This entire session was performed under direct supervision and direction of a licensed therapist/therapist assistant . I have personally read, edited and approve of the note as written. Netta Corrigan, SPT Alanson Puls, PT, DPT  2017/06/06, 2:51 PM  [place] MAIN [facility] SERVICES [address] [place], [place], [address] Phone: [phone]   Fax:  [phone]  Name: Mario Liu MRN: [id number] Date of Birth: 06-04-1961

## 2017-06-01 ENCOUNTER — Ambulatory Visit: Payer: Medicare Other | Admitting: Speech Pathology

## 2017-06-01 ENCOUNTER — Encounter: Payer: Self-pay | Admitting: Speech Pathology

## 2017-06-01 ENCOUNTER — Ambulatory Visit: Payer: Medicare Other

## 2017-06-01 DIAGNOSIS — M6281 Muscle weakness (generalized): Secondary | ICD-10-CM | POA: Diagnosis not present

## 2017-06-01 DIAGNOSIS — R2681 Unsteadiness on feet: Secondary | ICD-10-CM

## 2017-06-01 DIAGNOSIS — R4701 Aphasia: Secondary | ICD-10-CM

## 2017-06-01 DIAGNOSIS — R471 Dysarthria and anarthria: Secondary | ICD-10-CM

## 2017-06-01 NOTE — Therapy (Signed)
[place] MAIN [facility] SERVICES [address] [place], [place], [address] Phone: [phone]   Fax:  [phone]  Speech Language Pathology Evaluation  Patient Details  Name: Mario Liu MRN: [id number] Date of Birth: [birth year] Referring Provider: Bary Leriche  Encounter Date: 06/01/2017      End of Session - 06/01/17 1241    Visit Number 1   Number of Visits 17   Date for SLP Re-Evaluation 08/01/17   SLP Start Time 5009   SLP Stop Time  1100   SLP Time Calculation (min) 45 min   Activity Tolerance Patient tolerated treatment well      Past Medical History:  Diagnosis Date  . COPD (chronic obstructive pulmonary disease) ([place])   . Hepatitis C   . Hx of cholecystectomy   . Hypothyroidism   . Kidney cysts   . Major depressive disorder    Was treated at facility in [place] (D and G) years ago  . Tobacco abuse   . Vitamin D deficiency     Past Surgical History:  Procedure Laterality Date  . LAPAROSCOPIC CHOLECYSTECTOMY      There were no vitals filed for this visit.          SLP Evaluation [facility] - 06/01/17 1232      SLP Visit Information   SLP Received On 06/01/17   Referring Provider LOVE, Marcus   Onset Date 05/14/2017   Medical Diagnosis Left CVA     Subjective   Subjective The patient is unintelligible to unfamiliar listener   Patient/Family Stated Goal Functional communication for environment     Pain Assessment   Currently in Pain? No/denies     General Information   HPI 56 year old man with PMH significant for depression, Down's syndrome (currently living in a group home), COPD, and tobacco use who presents with aphasia the morning of 05/14/2017. History obtained from group home caregivers. Head CT at [facility] demonstrated hypodensity in L MCA territory, consistent with infarct. At baseline, patient is able to walk on his own, talk, dress himself, do some chores, but unable to complete activities of daily living,  including cooking, cleaning, and needs some help with bathing. Caregivers state that he did recognize who they were and that his speech is still different than it has been. The patient's communication baseline is not clear.     Prior Functional Status   Cognitive/Linguistic Baseline Baseline deficits   Baseline deficit details Down syndrome, able to complete basic functional/verbal tasks, lives in a group home with assisst for bathing, cooking etc.    Type of Home Group Home     Cognition   Overall Cognitive Status No family/caregiver present to determine baseline cognitive functioning     Auditory Comprehension   Overall Auditory Comprehension Impaired   Overall Auditory Comprehension Comments --  Inconsistently follows 1- and 2-step commands     Expression   Primary Mode of Expression Verbal     Verbal Expression   Overall Verbal Expression Impaired   Initiation No impairment   Automatic Speech Name;Social Response   Level of Generative/Spontaneous Verbalization Word;Phrase   Repetition Impaired  omit final consonants, simplify multi-syllabic, fronting   Level of Impairment Phrase level   Naming No impairment   Responsive 0-25% accurate     Oral Motor/Sensory Function   Overall Oral Motor/Sensory Function Impaired at baseline     Motor Speech   Overall Motor Speech Impaired at baseline   Respiration Within  functional limits   Phonation Normal   Resonance Within functional limits   Articulation Impaired   Level of Impairment Word   Intelligibility Intelligibility reduced   Word 50-74% accurate   Phrase 25-49% accurate   Sentence 0-24% accurate   Conversation 0-24% accurate   Interfering Components Premorbid status;Anatomical limitations   Effective Techniques Slow rate;Increased vocal intensity;Over-articulate  Imitate model                      ADULT SLP TREATMENT - 06/01/17 0001      General Information   HPI 56 year old man with PMH significant  for depression, Down's syndrome (currently living in a group home), COPD, and tobacco use who presents with aphasia the morning of 05/14/2017. History obtained from group home caregivers. Head CT at [facility] demonstrated hypodensity in L MCA territory, consistent with infarct. At baseline, patient is able to walk on his own, talk, dress himself, do some chores, but unable to complete activities of daily living, including cooking, cleaning, and needs some help with bathing. Caregivers state that he did recognize who they were and that his speech is still different than it has been. The patient's communication baseline is not clear.           SLP Education - 06/01/17 1241    Education provided Yes   Education Details over-articulation   Person(s) Educated Patient   Methods Explanation;Demonstration;Verbal cues   Comprehension Need further instruction            SLP Long Term Goals - 06/01/17 1358      SLP LONG TERM GOAL #1   Title Pt will improve speech intelligibility for words/phrases by controlling rate of speech, over-articulation, correct phonetic placement, and increased loudness to achieve 80% intelligibility.   Time 8   Period Weeks   Status New   Target Date 08/01/17     SLP LONG TERM GOAL #2   Title Patient will complete 2 unit processing tasks with 80% accuracy without the need of repetition of task instructions or significant delays in responding.   Time 8   Period Weeks   Status New   Target Date 08/01/17     SLP LONG TERM GOAL #3   Title Patient will generate meaningful and intelligible phrase to complete simple/concrete linguistic task with 80% accuracy.   Time 8   Period Weeks   Status New   Target Date 08/01/17          Plan - 06/01/17 1243    Clinical Impression Statement At 2 weeks post onset of left CVA, the patient is presenting with dysarthria and aphasia characterized by unintelligible speech (due to imprecise articulation as well as phonological  deviations), telegraphic/simplified speech, and inconsistent auditory comprehension for simple commands/questions.  It is not clear what the patient's speech and language baseline is.  However, by report his previous communication skills were functional for his environment.  The patient will benefit from skilled speech therapy to maximize functional communication.   Speech Therapy Frequency 2x / week   Duration Other (comment)  8 weeks   Potential to Achieve Goals Fair   Potential Considerations Ability to learn/carryover information;Co-morbidities;Cooperation/participation level;Medical prognosis;Previous level of function;Severity of impairments;Family/community support   SLP Home Exercise Plan To be determined   Consulted and Agree with Plan of Care Patient      Patient will benefit from skilled therapeutic intervention in order to improve the following deficits and impairments:   Dysarthria and  anarthria - Plan: SLP plan of care cert/re-cert  Aphasia - Plan: SLP plan of care cert/re-cert      G-Codes - 53/97/67 1400    Functional Assessment Tool Used clinical judgment   Functional Limitations Motor speech   Motor Speech Current Status [phone]) At least 60 percent but less than 80 percent impaired, limited or restricted   Motor Speech Goal Status (X9024) At least 20 percent but less than 40 percent impaired, limited or restricted      Problem List Patient Active Problem List   Diagnosis Date Noted  . Major depressive disorder   . Hepatitis C antibody test positive 05/16/2017  . Acute ischemic left MCA stroke ([place])   . Down's syndrome   . PFO with atrial septal aneurysm    [name], MS/CCC- SLP  [name] 06/01/2017, 5:04 PM  [place] MAIN [facility] SERVICES [address] [place], [place], [address] Phone: [phone]   Fax:  [phone]  Name: Mario Liu MRN: [id number] Date of Birth: Jan 03, 1961

## 2017-06-01 NOTE — Therapy (Signed)
[place] MAIN [facility] SERVICES [address] [place], [place], [address] Phone: [phone]   Fax:  [phone]  Physical Therapy Treatment  Patient Details  Name: Mario Liu MRN: [id number] Date of Birth: 1961-08-14 Referring Provider: Bary Leriche  Encounter Date: 06/01/2017      PT End of Session - 06/01/17 1232    Visit Number 2   Number of Visits 25   Date for PT Re-Evaluation 08/20/17   Authorization Type 2/10 g code   PT Start Time 1115   PT Stop Time 1204   PT Time Calculation (min) 49 min   Equipment Utilized During Treatment Gait belt   Activity Tolerance Patient tolerated treatment well;Patient limited by fatigue   Behavior During Therapy [facility] for tasks assessed/performed      Past Medical History:  Diagnosis Date  . COPD (chronic obstructive pulmonary disease) ([place])   . Hepatitis C   . Hx of cholecystectomy   . Hypothyroidism   . Kidney cysts   . Major depressive disorder    Was treated at facility in [place] (D and G) years ago  . Tobacco abuse   . Vitamin D deficiency     Past Surgical History:  Procedure Laterality Date  . LAPAROSCOPIC CHOLECYSTECTOMY      There were no vitals filed for this visit.      Subjective Assessment - 06/01/17 1127    Subjective Patient not present with group home administrator, communicates with short sentances. No falls per pt. report    Patient is accompained by: --  Group home administrator    Pertinent History Pt is a 56 yo w/Down syndrome w/CVA on 05/14/17 resulting in weakness of R UE and LE and mild weakness of L UE and LE.  Pt also had dysphagia prior to stroke and is own dyspagia 2 diet and thickened liquids due to risk of aspiration.  Pt has hx of    Limitations Standing;Walking   How long can you sit comfortably? unlimited   How long can you stand comfortably? 30 minutes or less before needing to rest   How long can you walk comfortably? 10 minutes before needing to  rest   Patient Stated Goals "better balance"    Currently in Pain? No/denies     weak RUE and LE.   6 min walk test=740 ft , 1 rest break, 2 trips with RLE stumbling and PT utilizing gait belt to retain COM.   TherEx Partial squats  6" step up 10x each leg, cues for task orientation Seated marches, hands on hips, cues for upright posture, 20x, challenging to patient.  Seated abduction RTB 20x  Seated adduction with a pillow 15x Seated knee extension with 2lb ankle weights 10x each leg  Unable to perform PF exercise due to confusion/fatigue Resisted side step seated position with RTB 10x each leg   Neuro Re-ed Airex pad balance 60 seconds  airex pad with horizontal head turns, cues for " look at me, look at the door"  Step over 2 consecutive hurdles in // bars 3 cone tap in seated position, difficulty with not knocking cones over with RLE.    Patient required frequent task orientation and sequencing verbal cues. Occasional tactile cues for body mechanics     Pt. response to medical necessity: . Patient will continue to benefit from skilled physical therapy to address decreased strength, endurance, and balance.  PT Education - 06/01/17 1232    Education provided Yes   Education Details body mechanics for functional strengthening   Person(s) Educated Patient   Methods Explanation;Demonstration;Verbal cues   Comprehension Verbalized understanding          PT Short Term Goals - 05/28/17 1426      PT SHORT TERM GOAL #1   Title Pt will demonstrate independence w/HEP in order to increase strength and balance to reduce overall risk of falls.   Time 6   Period Weeks   Status New   Target Date 07/09/17     PT SHORT TERM GOAL #2   Title Pt will improve decrease 5 time sit to stand to <17 seconds in order to demonstrate increased LE strength and balance.   Baseline 19 sec   Time 6   Period Weeks   Status New   Target Date 07/09/17      PT SHORT TERM GOAL #3   Title Pt will improve gait speed measured by 10 m WT to 0.8 m/s to demonstrate increased LE strength and functional mobility in order to decrease risk of falls and increase participation in group home activities.   Baseline 0.71 m/s   Time 6   Period Weeks   Status New   Target Date 07/09/17     PT SHORT TERM GOAL #4   Title Pt will improve TUG to < 15 seconds in order to demonstrate improved balance and functional activity tolerance.   Baseline 17 sec   Time 6   Period Weeks   Status New   Target Date 07/09/17     PT SHORT TERM GOAL #5   Title Pt will improve Berg Balance score by at least 4 points in order to demonstrate improved dynamic and static, reducing overall falls risk.   Baseline 36/56   Time 6   Period Weeks   Status New   Target Date 07/09/17           PT Long Term Goals - 05/28/17 1432      PT LONG TERM GOAL #1   Title Pt will improve B LE strength to at least 4/5 in all limited planes in order to increase funtional activity tolerance and return to group home activities.    Baseline gross strength assessment: 3/5   Time 12   Period Weeks   Status New   Target Date 08/20/17     PT LONG TERM GOAL #2   Title Pt will improve 5 time sit to stand to < 15 seconds in order to demonstrate increase in LE strength and balance.   Baseline 19 sec   Time 12   Period Weeks   Status New   Target Date 07/09/17     PT LONG TERM GOAL #3   Title Pt will incrase gait speed measured by 10 m WT to at least 1.0 m/s in order to demonstrate improved balance and LE strength and to decrease overall risk for falls.   Baseline 0.71 m/s   Time 12   Period Weeks   Status New   Target Date 08/20/17     PT LONG TERM GOAL #4   Title Pt will improve TUG to < 13 seconds in order to demonstrate an increase in LE strength and improved dynamic balance.   Baseline 17 seconds   Time 12   Period Weeks   Status New   Target Date 08/20/17     PT LONG TERM  GOAL #5  Title Pt will demonstrate an increase in [place] by at least 10 points in order to demonstrate improved dynamic and static balance, reducing overall risk of falls.   Baseline 36/56   Time 12   Period Weeks   Status New   Target Date 08/20/17               Plan - 06/01/17 1234    Clinical Impression Statement  Patient requires frequent cueing for task orientation and sequencing. Occasional trippage over RLE when ambulating occurred during 6 min walk test. Static balance limited by LE fatigue. Patient will continue to benefit from skilled physical therapy to address decreased strength, endurance, and balance.    Rehab Potential Good   Clinical Impairments Affecting Rehab Potential transportation limitations, living situation   PT Frequency 2x / week   PT Duration 12 weeks   PT Treatment/Interventions Cryotherapy;Moist Heat;Gait training;Therapeutic activities;Therapeutic exercise;Balance training;Neuromuscular re-education;Patient/family education;Manual techniques;Passive range of motion;Dry needling   PT Next Visit Plan 6 MWT to assess endurance; begin strength and balance exercises    PT Home Exercise Plan begin HEP next session    Consulted and Agree with Plan of Care Patient;Other (Comment)      Patient will benefit from skilled therapeutic intervention in order to improve the following deficits and impairments:  Abnormal gait, Decreased activity tolerance, Decreased balance, Decreased coordination, Decreased endurance, Decreased mobility, Decreased safety awareness, Decreased strength, Difficulty walking, Decreased cognition  Visit Diagnosis: Muscle weakness (generalized)  Unsteadiness on feet     Problem List Patient Active Problem List   Diagnosis Date Noted  . Major depressive disorder   . Hepatitis C antibody test positive 05/16/2017  . Acute ischemic left MCA stroke ([place])   . Down's syndrome   . PFO with atrial septal aneurysm     Janna Arch, PT, DPT   Janna Arch 06/01/2017, 12:35 PM  [place] MAIN [facility] SERVICES [address] [place], [place], [address] Phone: [phone]   Fax:  [phone]  Name: Mario Liu MRN: [id number] Date of Birth: 09-Jul-1961

## 2017-06-04 LAB — HCV RNA QUANT: HCV Quantitative: NOT DETECTED IU/mL (ref >50)

## 2017-06-05 ENCOUNTER — Ambulatory Visit: Payer: Medicare Other | Admitting: Speech Pathology

## 2017-06-05 ENCOUNTER — Ambulatory Visit: Payer: Medicare Other | Admitting: Occupational Therapy

## 2017-06-05 ENCOUNTER — Ambulatory Visit: Payer: Medicare Other

## 2017-06-05 DIAGNOSIS — M6281 Muscle weakness (generalized): Secondary | ICD-10-CM

## 2017-06-05 DIAGNOSIS — R278 Other lack of coordination: Secondary | ICD-10-CM

## 2017-06-05 DIAGNOSIS — R2681 Unsteadiness on feet: Secondary | ICD-10-CM

## 2017-06-05 NOTE — Therapy (Signed)
[place] MAIN [facility] SERVICES [address] [place], [place], [address] Phone: [phone]   Fax:  [phone]  Physical Therapy Treatment  Patient Details  Name: Bradrick Kamau MRN: [id number] Date of Birth: 10-12-1960 Referring Provider: Bary Leriche  Encounter Date: 06/05/2017      PT End of Session - 06/05/17 1121    Visit Number 3   Number of Visits 25   Date for PT Re-Evaluation 08/20/17   Authorization Type 3/10 g code   PT Start Time 4650   PT Stop Time 1130   PT Time Calculation (min) 45 min   Equipment Utilized During Treatment Gait belt   Activity Tolerance Patient tolerated treatment well;Patient limited by fatigue   Behavior During Therapy [facility] for tasks assessed/performed      Past Medical History:  Diagnosis Date  . COPD (chronic obstructive pulmonary disease) ([place])   . Hepatitis C   . Hx of cholecystectomy   . Hypothyroidism   . Kidney cysts   . Major depressive disorder    Was treated at facility in [place] (D and G) years ago  . Tobacco abuse   . Vitamin D deficiency     Past Surgical History:  Procedure Laterality Date  . LAPAROSCOPIC CHOLECYSTECTOMY      There were no vitals filed for this visit.      Subjective Assessment - 06/05/17 1048    Subjective Patient reports no falls. Enjoys basketball, football, and baseball to watch.    Patient is accompained by: --  Group home administrator    Pertinent History Pt is a 56 yo w/Down syndrome w/CVA on 05/14/17 resulting in weakness of R UE and LE and mild weakness of L UE and LE.  Pt also had dysphagia prior to stroke and is own dyspagia 2 diet and thickened liquids due to risk of aspiration.  Pt has hx of    Limitations Standing;Walking   How long can you sit comfortably? unlimited   How long can you stand comfortably? 30 minutes or less before needing to rest   How long can you walk comfortably? 10 minutes before needing to rest   Patient Stated Goals  "better balance"    Currently in Pain? No/denies        Nustep lvl 3, 3 minutes  TherEx  Standing marches 20x  Sit to stands 11 x from low chair [place] 10x , required visual cue of picture to understand exercise  straight leg raise 10x each leg, required picture for understanding Squat side step in // bars 2x length of bars, require PT to perform simultaneously to understand body mechanics/sequencing.   Neuro RE-ed rhomberg stance airex pad: balance 60 seconds airex pad horizontal head turns with hand movements 60 seconds Airex pad toe taps  6" step 20x , BUE support, medium difficulty for patient  Football up and over drill with green bench: step over L and right (with one foot on bench at all times) 15x. Required frequent verbal cueing and visual demonstration for performance.  Tandem walk forwards and backwards with SUE support an airex beam  Pass basketball back and forth 30x no LOB.    Patient performs best when pictures of tasks are implemented to explain to patient what to do.                          PT Education - 06/05/17 1049    Education provided Yes  Education Details new HEP   Person(s) Educated Patient;Caregiver(s)   Methods Explanation;Demonstration;Handout;Verbal cues   Comprehension Verbalized understanding;Returned demonstration          PT Short Term Goals - 05/28/17 1426      PT SHORT TERM GOAL #1   Title Pt will demonstrate independence w/HEP in order to increase strength and balance to reduce overall risk of falls.   Time 6   Period Weeks   Status New   Target Date 07/09/17     PT SHORT TERM GOAL #2   Title Pt will improve decrease 5 time sit to stand to <17 seconds in order to demonstrate increased LE strength and balance.   Baseline 19 sec   Time 6   Period Weeks   Status New   Target Date 07/09/17     PT SHORT TERM GOAL #3   Title Pt will improve gait speed measured by 10 m WT to 0.8 m/s to demonstrate increased  LE strength and functional mobility in order to decrease risk of falls and increase participation in group home activities.   Baseline 0.71 m/s   Time 6   Period Weeks   Status New   Target Date 07/09/17     PT SHORT TERM GOAL #4   Title Pt will improve TUG to < 15 seconds in order to demonstrate improved balance and functional activity tolerance.   Baseline 17 sec   Time 6   Period Weeks   Status New   Target Date 07/09/17     PT SHORT TERM GOAL #5   Title Pt will improve Berg Balance score by at least 4 points in order to demonstrate improved dynamic and static, reducing overall falls risk.   Baseline 36/56   Time 6   Period Weeks   Status New   Target Date 07/09/17           PT Long Term Goals - 05/28/17 1432      PT LONG TERM GOAL #1   Title Pt will improve B LE strength to at least 4/5 in all limited planes in order to increase funtional activity tolerance and return to group home activities.    Baseline gross strength assessment: 3/5   Time 12   Period Weeks   Status New   Target Date 08/20/17     PT LONG TERM GOAL #2   Title Pt will improve 5 time sit to stand to < 15 seconds in order to demonstrate increase in LE strength and balance.   Baseline 19 sec   Time 12   Period Weeks   Status New   Target Date 07/09/17     PT LONG TERM GOAL #3   Title Pt will incrase gait speed measured by 10 m WT to at least 1.0 m/s in order to demonstrate improved balance and LE strength and to decrease overall risk for falls.   Baseline 0.71 m/s   Time 12   Period Weeks   Status New   Target Date 08/20/17     PT LONG TERM GOAL #4   Title Pt will improve TUG to < 13 seconds in order to demonstrate an increase in LE strength and improved dynamic balance.   Baseline 17 seconds   Time 12   Period Weeks   Status New   Target Date 08/20/17     PT LONG TERM GOAL #5   Title Pt will demonstrate an increase in Berg Balance Assessment by at least 10  points in order to  demonstrate improved dynamic and static balance, reducing overall risk of falls.   Baseline 36/56   Time 12   Period Weeks   Status New   Target Date 08/20/17               Plan - 06/05/17 1132    Clinical Impression Statement Utilization of visual images on computer for task in combination with demonstration best prepare patient for performing an intervention. Verbal cueing limited to single commands, occasionally repeats back last word spoken. Static balance improving with no episodes of LOB. Patient will continue to benefit from skilled phsyical therpay to address decreased strength, endurance, and balance.     Rehab Potential Good   Clinical Impairments Affecting Rehab Potential transportation limitations, living situation   PT Frequency 2x / week   PT Duration 12 weeks   PT Treatment/Interventions Cryotherapy;Moist Heat;Gait training;Therapeutic activities;Therapeutic exercise;Balance training;Neuromuscular re-education;Patient/family education;Manual techniques;Passive range of motion;Dry needling   PT Next Visit Plan 6 MWT to assess endurance; begin strength and balance exercises    PT Home Exercise Plan review HEP, basketball pass on airex pad, walking football/basketball pass   Consulted and Agree with Plan of Care Patient;Other (Comment)      Patient will benefit from skilled therapeutic intervention in order to improve the following deficits and impairments:  Abnormal gait, Decreased activity tolerance, Decreased balance, Decreased coordination, Decreased endurance, Decreased mobility, Decreased safety awareness, Decreased strength, Difficulty walking, Decreased cognition  Visit Diagnosis: Muscle weakness (generalized)  Unsteadiness on feet     Problem List Patient Active Problem List   Diagnosis Date Noted  . Major depressive disorder   . Hepatitis C antibody test positive 05/16/2017  . Acute ischemic left MCA stroke ([place])   . Down's syndrome   . PFO with  atrial septal aneurysm    Janna Arch, PT, DPT   Janna Arch 06/05/2017, 11:35 AM  [place] MAIN [facility] SERVICES [address] [place], [place], [address] Phone: [phone]   Fax:  [phone]  Name: Jatorian Renault MRN: [id number] Date of Birth: August 07, 1961

## 2017-06-05 NOTE — Therapy (Signed)
[place] MAIN [facility] SERVICES [address] [place], [place], [address] Phone: [phone]   Fax:  [phone]  Occupational Therapy Evaluation  Patient Details  Name: Mario Liu MRN: [id number] Date of Birth: 1960-10-24 Referring Provider: Dr. Erling Cruz  Encounter Date: 06/05/2017      OT End of Session - 06/05/17 1226    Visit Number 1   Number of Visits 24   Date for OT Re-Evaluation 08/28/17   Authorization Type Medicare G code 1 of 10   OT Start Time 0945   OT Stop Time 1045   OT Time Calculation (min) 60 min   Activity Tolerance Patient tolerated treatment well   Behavior During Therapy [facility] for tasks assessed/performed      Past Medical History:  Diagnosis Date  . COPD (chronic obstructive pulmonary disease) (Mario Liu)   . Hepatitis C   . Hx of cholecystectomy   . Hypothyroidism   . Kidney cysts   . Major depressive disorder    Was treated at facility in [place] (D and G) years ago  . Tobacco abuse   . Vitamin D deficiency     Past Surgical History:  Procedure Laterality Date  . LAPAROSCOPIC CHOLECYSTECTOMY      There were no vitals filed for this visit.      Subjective Assessment - 06/05/17 1212    Subjective  Pt. was transported to therapy by the group home. Pt. was waiting in the waiting room by himself.   Patient is accompained by: Family member   Pertinent History Pt. is a 56 y.o. 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 Pt. presents with residual UE, and LE weakness. Pt. was hospitalized, and transferred to inpatient Rehab at [facility]. Pt. is on a Dysphagia 2 diet with thickened liquids. Pt. resides in a [place]. Pt. PMHx includes: Down's Syndrome, Major Depressive Disorder, Constipation, and COPD.   Currently in Pain? No/denies           [facility] OT Assessment - 06/05/17 1000      Assessment   Diagnosis CVA   Referring  Provider Dr. Erling Cruz   Onset Date 05/14/17     Restrictions   Weight Bearing Restrictions No     Balance Screen   Has the patient fallen in the past 6 months No   Has the patient had a decrease in activity level because of a fear of falling?  No   Is the patient reluctant to leave their home because of a fear of falling?  Yes     Home  Environment   Family/patient expects to be discharged to: Group home   Available Help at Discharge Available 24 hours/day   Type of [place] One level   Alternate Level Stairs - Number of Steps 2   Bathroom Building control surveyor;Noel - 2 wheels;Tub bench;Hand held shower head   Lives With Other (Comment)  Group Home     Prior Function   Level of Independence Needs assistance with ADLs   Vocation On disability   Leisure Basketball. baseball     ADL   Eating/Feeding Set up   Grooming Set up   Upper Body Bathing Independent   Lower Body Bathing Independent   Upper Body Dressing Set up   Lower Body Dressing Minimal assistance   Toilet Transfer Independent  Toileting -  Hygiene Independent     IADL   Shopping Needs to be accompanied on any shopping trip   Light Housekeeping Needs help with all home maintenance tasks   Meal Prep Needs to have meals prepared and served   Medication Management Is not capable of dispensing or managing own medication   Financial Management Requires assistance     Written Expression   Dominant Hand Left     Vision - History   Baseline Vision Wears glasses only for reading     Cognition   Overall Cognitive Status Impaired/Different from baseline     Coordination   Right 9 Hole Peg Test 47   Left 9 Hole Peg Test 51     Hand Function   Right Hand Grip (lbs) 25   Right Hand Lateral Pinch 13 lbs   Right Hand 3 Point Pinch 9 lbs   Left Hand Grip (lbs) 32   Left Hand Lateral Pinch 9 lbs   Left 3 point pinch 10 lbs      OT TREATMENT    Therapeutic  Exercise:  Pt. Worked on the [employer] for 8 min. With constant monitoring of the BUEs. Pt. Worked on changing, and alternating forward reverse position every 2 min. Rest breaks were required.                          OT Education - 06/05/17 1225    Education provided Yes   Education Details BUE functioning, self-care   Person(s) Educated Patient   Methods Explanation;Demonstration;Verbal cues;Handout   Comprehension Verbalized understanding;Returned demonstration          OT Short Term Goals - 06/05/17 1236      OT SHORT TERM GOAL #1   Title --   Baseline --   Time --   Period --   Status --   Target Date --     OT SHORT TERM GOAL #2   Title --   Baseline --   Time --   Period --   Status --   Target Date --     OT SHORT TERM GOAL #3   Title --   Baseline --   Time --   Period --   Status --   Target Date --     OT SHORT TERM GOAL #4   Title --   Baseline --   Time --   Period --   Status --   Target Date --     OT SHORT TERM GOAL #5   Title --   Baseline --   Time --   Period --   Status --   Target Date --           OT Long Term Goals - 06/05/17 1247      OT LONG TERM GOAL #1   Title Pt. will improve bilateral UE strength by 2 mm grades to assist with ADLs/IADLs.   Baseline Impaired UE strength   Time 12   Period Weeks   Status New   Target Date 08/28/17     OT LONG TERM GOAL #2   Title Pt. will improve bilateral grip strength to be able to hold aand use a vacuum   Baseline Limited bilateral grip strength   Time 12   Period Weeks   Status New   Target Date 08/28/17     OT LONG TERM GOAL #3   Title Pt. will improve bilateral  hand [facility] skills to assist with manipulating ADL objects.   Baseline Limited bilateral [facility] skills   Time 12   Period Weeks   Status New   Target Date 08/28/17     OT LONG TERM GOAL #4   Title Pt. will improve bilateral pinch strength to be able to independently open containers   Baseline  Impaired pinch strength   Time 12   Period Weeks   Status New   Target Date 08/28/17     OT LONG TERM GOAL #5   Title Pt. will improve bilateral eye hand coordination skills to be able to dribble a basketball.   Baseline Impaired   Time 12   Period Weeks   Status New   Target Date 08/28/17               Plan - 2017/06/14 1227    Clinical Impression Statement Pt. is a 56 y.o. male who suffered a CVA, and presents with bilateral UE weakness which limits his ability to complete basic ADL, and IADL tasks. Pt. has a history of Down's Syndrome, and resides in a group home. Pt. reports having assistance with morning ADL tasks, medication management, and IADL tasks including meal preparation, and vacuuming. Pt. will benefit from skilled OT serivces to improve UE functioning for improved engagement in ADL, and IADL tasks. Pt. reports that he would like to be able to play sports again including: basketball, football, and baseball, to use his arms again, and to be able to vacuum.    Occupational Profile and client history currently impacting functional performance Pt. resides at a group home. pt. reports he was working, however is not working now.   Occupational performance deficits (Please refer to evaluation for details): ADL's;IADL's   Rehab Potential Good   OT Frequency 2x / week   OT Duration 12 weeks   OT Treatment/Interventions Self-care/ADL training;Patient/family education;Therapeutic activities;Cognitive remediation/compensation;Visual/perceptual remediation/compensation;Neuromuscular education;DME and/or AE instruction;Therapeutic exercise;Therapeutic exercises;Energy conservation   Clinical Decision Making Several treatment options, min-mod task modification necessary   Consulted and Agree with Plan of Care Patient      Patient will benefit from skilled therapeutic intervention in order to improve the following deficits and impairments:  Impaired UE functional use, Decreased  knowledge of precautions, Decreased strength, Decreased range of motion, Decreased activity tolerance, Impaired perceived functional ability, Decreased coordination, Decreased knowledge of use of DME, Impaired vision/preception, Decreased cognition  Visit Diagnosis: Muscle weakness (generalized)  Other lack of coordination      G-Codes - 06-14-17 1256    Functional Assessment Tool Used (Outpatient only) Clinical Judgement based on pt. current functional level   Functional Limitation Self care   Self Care Current Status (U8891) At least 20 percent but less than 40 percent impaired, limited or restricted   Self Care Goal Status (Q9450) 0 percent impaired, limited or restricted      Problem List Patient Active Problem List   Diagnosis Date Noted  . Major depressive disorder   . Hepatitis C antibody test positive 05/16/2017  . Acute ischemic left MCA stroke ([place])   . Down's syndrome   . PFO with atrial septal aneurysm     Harrel Carina, MS, OTR/L 06-14-2017, 12:58 PM  [place] MAIN [facility] SERVICES [address] [place], [place], [address] Phone: [phone]   Fax:  [phone]  Name: Dacen Frayre MRN: [id number] Date of Birth: 27-May-1961

## 2017-06-08 ENCOUNTER — Ambulatory Visit: Payer: Medicare Other | Admitting: Occupational Therapy

## 2017-06-08 ENCOUNTER — Ambulatory Visit: Payer: Medicare Other | Admitting: Physical Therapy

## 2017-06-08 ENCOUNTER — Encounter: Payer: Self-pay | Admitting: Physical Therapy

## 2017-06-08 DIAGNOSIS — M6281 Muscle weakness (generalized): Secondary | ICD-10-CM

## 2017-06-08 DIAGNOSIS — R278 Other lack of coordination: Secondary | ICD-10-CM

## 2017-06-08 DIAGNOSIS — R2681 Unsteadiness on feet: Secondary | ICD-10-CM

## 2017-06-08 NOTE — Therapy (Signed)
[place] MAIN [facility] SERVICES [address] [place], [place], [address] Phone: [phone]   Fax:  [phone]  Physical Therapy Treatment  Patient Details  Name: Mario Liu MRN: [id number] Date of Birth: [birth year] Referring Provider: Bary Leriche  Encounter Date: 06/08/2017      PT End of Session - 06/08/17 1304    Visit Number 4   Number of Visits 25   Date for PT Re-Evaluation 08/20/17   Authorization Type 4/10 g code   PT Start Time 1300   PT Stop Time 1345   PT Time Calculation (min) 45 min   Equipment Utilized During Treatment Gait belt   Activity Tolerance Patient tolerated treatment well;Patient limited by fatigue   Behavior During Therapy [facility] for tasks assessed/performed      Past Medical History:  Diagnosis Date  . COPD (chronic obstructive pulmonary disease) ([place])   . Hepatitis C   . Hx of cholecystectomy   . Hypothyroidism   . Kidney cysts   . Major depressive disorder    Was treated at facility in [place] (D and G) years ago  . Tobacco abuse   . Vitamin D deficiency     Past Surgical History:  Procedure Laterality Date  . LAPAROSCOPIC CHOLECYSTECTOMY      There were no vitals filed for this visit.      Subjective Assessment - 06/08/17 1303    Subjective Patient reports doing well; no new falls; reports compliance with HEP;    Patient is accompained by: --  Group home administrator    Pertinent History Pt is a 56 yo w/Down syndrome w/CVA on 05/14/17 resulting in weakness of R UE and LE and mild weakness of L UE and LE.  Pt also had dysphagia prior to stroke and is own dyspagia 2 diet and thickened liquids due to risk of aspiration.  Pt has hx of    Limitations Standing;Walking   How long can you sit comfortably? unlimited   How long can you stand comfortably? 30 minutes or less before needing to rest   How long can you walk comfortably? 10 minutes before needing to rest   Patient Stated Goals "better  balance"    Currently in Pain? No/denies       TREATMENT: Warm up on N[place] BUE/BLE level 2 x5 min (Unbilled);  Balance: Standing on airex: Alternate march x15 with cues to increase ROM for better hip flexion; Heel raises x10 with mod VCs for positioning and sequencing for technique;  Feet apart: Ball pass side/side x5 reps Feet apart: Ball toss x5 reps; Modified tandem stance: Ball pass side/side x5 reps each foot in front; Modified tandem stance: Ball toss x5 reps each foot in front; Patient required min VCs for balance stability, including to increase trunk control for less loss of balance with smaller base of support  Resisted walking: 7.5# forward/backward, side/side 2 way, x2 laps each with min A for balance and cues to increase step length for better gait safety;   Exercise: Seated green tband hip flexion 2x10 with visual cues to increase ROM for better strengthening;  Seated green tband hip abduction/ER 2x10 with min VCs to slow down exercise for better hip abduction activation;  Supine: Bridge with mod VCs and picture for best exercise technique x15 reps; He required multiple single step commands and tactile cues for positioning;  Sidelying: Hip abduction SLR x15 with mod Vcs and tactile cues to slow down and increase ROM/avoid trunk rotation for  best hip abductor strengthening;    Tolerated well- no fatigue at end of session;                    PT Education - 06/08/17 1303    Education provided Yes   Education Details BLE strengthening, balance;    Person(s) Educated Patient   Methods Explanation;Demonstration;Verbal cues   Comprehension Verbalized understanding;Returned demonstration;Verbal cues required;Need further instruction          PT Short Term Goals - 05/28/17 1426      PT SHORT TERM GOAL #1   Title Pt will demonstrate independence w/HEP in order to increase strength and balance to reduce overall risk of falls.   Time 6   Period  Weeks   Status New   Target Date 07/09/17     PT SHORT TERM GOAL #2   Title Pt will improve decrease 5 time sit to stand to <17 seconds in order to demonstrate increased LE strength and balance.   Baseline 19 sec   Time 6   Period Weeks   Status New   Target Date 07/09/17     PT SHORT TERM GOAL #3   Title Pt will improve gait speed measured by 10 m WT to 0.8 m/s to demonstrate increased LE strength and functional mobility in order to decrease risk of falls and increase participation in group home activities.   Baseline 0.71 m/s   Time 6   Period Weeks   Status New   Target Date 07/09/17     PT SHORT TERM GOAL #4   Title Pt will improve TUG to < 15 seconds in order to demonstrate improved balance and functional activity tolerance.   Baseline 17 sec   Time 6   Period Weeks   Status New   Target Date 07/09/17     PT SHORT TERM GOAL #5   Title Pt will improve Berg Balance score by at least 4 points in order to demonstrate improved dynamic and static, reducing overall falls risk.   Baseline 36/56   Time 6   Period Weeks   Status New   Target Date 07/09/17           PT Long Term Goals - 05/28/17 1432      PT LONG TERM GOAL #1   Title Pt will improve B LE strength to at least 4/5 in all limited planes in order to increase funtional activity tolerance and return to group home activities.    Baseline gross strength assessment: 3/5   Time 12   Period Weeks   Status New   Target Date 08/20/17     PT LONG TERM GOAL #2   Title Pt will improve 5 time sit to stand to < 15 seconds in order to demonstrate increase in LE strength and balance.   Baseline 19 sec   Time 12   Period Weeks   Status New   Target Date 07/09/17     PT LONG TERM GOAL #3   Title Pt will incrase gait speed measured by 10 m WT to at least 1.0 m/s in order to demonstrate improved balance and LE strength and to decrease overall risk for falls.   Baseline 0.71 m/s   Time 12   Period Weeks   Status New    Target Date 08/20/17     PT LONG TERM GOAL #4   Title Pt will improve TUG to < 13 seconds in order to demonstrate an increase  in LE strength and improved dynamic balance.   Baseline 17 seconds   Time 12   Period Weeks   Status New   Target Date 08/20/17     PT LONG TERM GOAL #5   Title Pt will demonstrate an increase in Berg Balance Assessment by at least 10 points in order to demonstrate improved dynamic and static balance, reducing overall risk of falls.   Baseline 36/56   Time 12   Period Weeks   Status New   Target Date 08/20/17               Plan - 06/08/17 1325    Clinical Impression Statement Patient instructed in advanced LE strengthening and balance exercise. He does require increased time and cues/demonstration for correct technique. Provided single step commands for better understanding. Patient hesitant to stand with narrow base of support due to imbalance. Patient instructed in advanced strengthening. He has increased weakness with decreased ROM against gravity. He would benefit from additional skilled PT intervention to improve strength, balance and gait safety;    Rehab Potential Good   Clinical Impairments Affecting Rehab Potential transportation limitations, living situation   PT Frequency 2x / week   PT Duration 12 weeks   PT Treatment/Interventions Cryotherapy;Moist Heat;Gait training;Therapeutic activities;Therapeutic exercise;Balance training;Neuromuscular re-education;Patient/family education;Manual techniques;Passive range of motion;Dry needling   PT Next Visit Plan 6 MWT to assess endurance; begin strength and balance exercises    PT Home Exercise Plan review HEP, basketball pass on airex pad, walking football/basketball pass   Consulted and Agree with Plan of Care Patient;Other (Comment)      Patient will benefit from skilled therapeutic intervention in order to improve the following deficits and impairments:  Abnormal gait, Decreased activity  tolerance, Decreased balance, Decreased coordination, Decreased endurance, Decreased mobility, Decreased safety awareness, Decreased strength, Difficulty walking, Decreased cognition  Visit Diagnosis: Muscle weakness (generalized)  Unsteadiness on feet     Problem List Patient Active Problem List   Diagnosis Date Noted  . Major depressive disorder   . Hepatitis C antibody test positive 05/16/2017  . Acute ischemic left MCA stroke ([place])   . Down's syndrome   . PFO with atrial septal aneurysm     Trotter,Margaret PT, DPT 06/08/2017, 1:45 PM  [place] MAIN [facility] SERVICES [address] [place], [place], [address] Phone: [phone]   Fax:  [phone]  Name: Kyriakos Babler MRN: [id number] Date of Birth: January 02, 1961

## 2017-06-08 NOTE — Therapy (Signed)
[place] MAIN [facility] SERVICES [address] [place], [place], [address] Phone: [phone]   Fax:  [phone]  Occupational Therapy Treatment  Patient Details  Name: Mario Liu MRN: [id number] Date of Birth: [birth year] Referring Provider: Dr. Erling Cruz  Encounter Date: 06/08/2017      OT End of Session - 06/08/17 1405    Visit Number 2   Number of Visits 24   Date for OT Re-Evaluation 08/28/17   Authorization Type Medicare G code 2 of 10   OT Start Time 6962   Activity Tolerance Patient tolerated treatment well   Behavior During Therapy [facility] for tasks assessed/performed      Past Medical History:  Diagnosis Date  . COPD (chronic obstructive pulmonary disease) ([place])   . Hepatitis C   . Hx of cholecystectomy   . Hypothyroidism   . Kidney cysts   . Major depressive disorder    Was treated at facility in [place] (D and G) years ago  . Tobacco abuse   . Vitamin D deficiency     Past Surgical History:  Procedure Laterality Date  . LAPAROSCOPIC CHOLECYSTECTOMY      There were no vitals filed for this visit.      Subjective Assessment - 06/08/17 1403    Subjective  Pt. was alone for therapy session.   Patient is accompained by: Family member   Pertinent History Pt. is a 56 y.o. 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 Pt. presents with residual UE, and LE weakness. Pt. was hospitalized, and transferred to inpatient Rehab at [facility]. Pt. is on a Dysphagia 2 diet with thickened liquids. Pt. resides in a [place]. Pt. PMHx includes: Down's Syndrome, Major Depressive Disorder, Constipation, and COPD.   Currently in Pain? No/denies      OT TREATMENT    Neuro muscular re-education:  Pt. worked on grasping coins from a tabletop surface, placing them into a resistive container, and pushing them through the slot while isolating his 2nd digit. A  resistive mat was placed under coins to aide in manipulating the coins and prevent sliding when picking them up.  Therapeutic Exercise:  Pt. performed 2# dowel ex. For UE strengthening secondary to weakness. Bilateral shoulder flexion, chest press, circular patterns, and elbow flexion/extension were performed. 2# dumbbell ex. for elbow flexion and extension, forearm supination/pronation, wrist flexion/extension, and radial deviation. Pt. requires rest breaks and verbal cues for proper technique. Pt. performed gross gripping with grip strengthener. Pt. worked on sustaining grip while grasping pegs and reaching at various heights. Gripper was placed in the 3rd resistive slot with the white resistive spring.                          OT Education - 06/08/17 1404    Education provided Yes   Education Details BUE strengthening   Person(s) Educated Patient   Methods Explanation;Demonstration;Verbal cues   Comprehension Verbalized understanding;Returned demonstration;Verbal cues required;Need further instruction             OT Long Term Goals - 06/05/17 1247      OT LONG TERM GOAL #1   Title Pt. will improve bilateral UE strength by 2 mm grades to assist with ADLs/IADLs.   Baseline Impaired UE strength   Time 12   Period Weeks   Status New   Target Date 08/28/17  OT LONG TERM GOAL #2   Title Pt. will improve bilateral grip strength to be able to hold aand use a vacuum   Baseline Limited bilateral grip strength   Time 12   Period Weeks   Status New   Target Date 08/28/17     OT LONG TERM GOAL #3   Title Pt. will improve bilateral hand [facility] skills to assist with manipulating ADL objects.   Baseline Limited bilateral [facility] skills   Time 12   Period Weeks   Status New   Target Date 08/28/17     OT LONG TERM GOAL #4   Title Pt. will improve bilateral pinch strength to be able to independently open containers   Baseline Impaired pinch strength   Time 12    Period Weeks   Status New   Target Date 08/28/17     OT LONG TERM GOAL #5   Title Pt. will improve bilateral eye hand coordination skills to be able to dribble a basketball.   Baseline Impaired   Time 12   Period Weeks   Status New   Target Date 08/28/17               Plan - 06/08/17 1405    Clinical Impression Statement Pt. reports no pain today. Pt. presents with weakness, and limited coordination skills. Pt. continues to work on improving UE strength, and coordination skills in order to improve ADLs, and IADLs.   Occupational performance deficits (Please refer to evaluation for details): ADL's;IADL's   Rehab Potential Good   OT Frequency 2x / week   OT Duration 12 weeks   OT Treatment/Interventions Self-care/ADL training;Patient/family education;Therapeutic activities;Cognitive remediation/compensation;Visual/perceptual remediation/compensation;Neuromuscular education;DME and/or AE instruction;Therapeutic exercise;Therapeutic exercises;Energy conservation      Patient will benefit from skilled therapeutic intervention in order to improve the following deficits and impairments:  Impaired UE functional use, Decreased knowledge of precautions, Decreased strength, Decreased range of motion, Decreased activity tolerance, Impaired perceived functional ability, Decreased coordination, Decreased knowledge of use of DME, Impaired vision/preception, Decreased cognition  Visit Diagnosis: Muscle weakness (generalized)  Other lack of coordination    Problem List Patient Active Problem List   Diagnosis Date Noted  . Major depressive disorder   . Hepatitis C antibody test positive 05/16/2017  . Acute ischemic left MCA stroke ([place])   . Down's syndrome   . PFO with atrial septal aneurysm     Harrel Carina, MS, OTR/L 06/08/2017, 2:15 PM  [place] MAIN [facility] SERVICES [address] [place], [place], [address] Phone: [phone]   Fax:   [phone]  Name: Mario Liu MRN: [id number] Date of Birth: 03-13-1961

## 2017-06-09 ENCOUNTER — Ambulatory Visit: Payer: Medicare Other | Admitting: Speech Pathology

## 2017-06-09 ENCOUNTER — Encounter: Payer: Self-pay | Admitting: Speech Pathology

## 2017-06-09 ENCOUNTER — Telehealth: Payer: Self-pay

## 2017-06-09 DIAGNOSIS — R4701 Aphasia: Secondary | ICD-10-CM

## 2017-06-09 DIAGNOSIS — R471 Dysarthria and anarthria: Secondary | ICD-10-CM

## 2017-06-09 DIAGNOSIS — M6281 Muscle weakness (generalized): Secondary | ICD-10-CM | POA: Diagnosis not present

## 2017-06-09 NOTE — Telephone Encounter (Signed)
I left a message for the supervisor at the pt's enrichment facility to contact the office to assist in making an appointment with Dr Burt Knack for Mario Liu.

## 2017-06-09 NOTE — Therapy (Signed)
[place] MAIN [facility] SERVICES [address] [place], [place], [address] Phone: [phone]   Fax:  [phone]  Speech Language Pathology Treatment  Patient Details  Name: Mario Liu MRN: [id number] Date of Birth: 04-02-1961 Referring Provider: Bary Leriche  Encounter Date: 06/09/2017      End of Session - 06/09/17 1559    Visit Number 2   Number of Visits 17   Date for SLP Re-Evaluation 08/01/17   SLP Start Time 1500   SLP Stop Time  1545   SLP Time Calculation (min) 45 min   Activity Tolerance Patient tolerated treatment well      Past Medical History:  Diagnosis Date  . COPD (chronic obstructive pulmonary disease) ([place])   . Hepatitis C   . Hx of cholecystectomy   . Hypothyroidism   . Kidney cysts   . Major depressive disorder    Was treated at facility in [place] ([facility] G) years ago  . Tobacco abuse   . Vitamin D deficiency     Past Surgical History:  Procedure Laterality Date  . LAPAROSCOPIC CHOLECYSTECTOMY      There were no vitals filed for this visit.      Subjective Assessment - 06/09/17 1558    Subjective Patient participated willingly throughout the session    Currently in Pain? No/denies               ADULT SLP TREATMENT - 06/09/17 0001      General Information   Behavior/Cognition Alert;Cooperative;Pleasant mood;Requires cueing     Treatment Provided   Treatment provided Cognitive-Linquistic     Pain Assessment   Pain Assessment No/denies pain     Cognitive-Linquistic Treatment   Treatment focused on Dysarthria   Skilled Treatment Patient named items and was judged to be 40% intelligible following maximum verbal cues and models; answered biographical questions and was judged to be about 40% intelligible in his answers. Cognition: patient correctly followed commands with 73% accuracy, including imitation, following moderate cueing     Assessment / Recommendations / [place]  with current plan of care     Progression Toward Goals   Progression toward goals Progressing toward goals          SLP Education - 06/09/17 1559    Education provided Yes   Education Details speak slowly and clearly to be more intelligible    Person(s) Educated Patient   Methods Explanation   Comprehension Verbalized understanding            SLP Long Term Goals - 06/01/17 1358      SLP LONG TERM GOAL #1   Title Pt will improve speech intelligibility for words/phrases by controlling rate of speech, over-articulation, correct phonetic placement, and increased loudness to achieve 80% intelligibility.   Time 8   Period Weeks   Status New   Target Date 08/01/17     SLP LONG TERM GOAL #2   Title Patient will complete 2 unit processing tasks with 80% accuracy without the need of repetition of task instructions or significant delays in responding.   Time 8   Period Weeks   Status New   Target Date 08/01/17     SLP LONG TERM GOAL #3   Title Patient will generate meaningful and intelligible phrase to complete simple/concrete linguistic task with 80% accuracy.   Time 8   Period Weeks   Status New   Target Date 08/01/17  Plan - 06/09/17 1600    Clinical Impression Statement The patient is presenting with dysarthria and aphasia characterized by unintelligible speech (due to imprecise articulation as well as phonological deviations), telegraphic/simplified speech, and inconsistent auditory comprehension for simple commands/questions.  It is not clear what the patient's speech and language baseline is.  However, by report his previous communication skills were functional for his environment.  The patient will benefit from skilled speech therapy to maximize functional communication.   Speech Therapy Frequency 2x / week   Duration Other (comment)   Potential to Achieve Goals Fair   Potential Considerations Ability to learn/carryover information      Patient will  benefit from skilled therapeutic intervention in order to improve the following deficits and impairments:   Dysarthria and anarthria  Aphasia    Problem List Patient Active Problem List   Diagnosis Date Noted  . Major depressive disorder   . Hepatitis C antibody test positive 05/16/2017  . Acute ischemic left MCA stroke ([place])   . Down's syndrome   . PFO with atrial septal aneurysm     Denman Pichardo [place] 06/09/2017, 4:46 PM  [place] MAIN [facility] SERVICES [address] [place], [place], [address] Phone: [phone]   Fax:  [phone]   Name: Mario Liu MRN: [id number] Date of Birth: Jun 17, 1961

## 2017-06-10 ENCOUNTER — Ambulatory Visit: Payer: Medicare Other | Admitting: Occupational Therapy

## 2017-06-10 DIAGNOSIS — M6281 Muscle weakness (generalized): Secondary | ICD-10-CM | POA: Diagnosis not present

## 2017-06-10 DIAGNOSIS — R278 Other lack of coordination: Secondary | ICD-10-CM

## 2017-06-10 NOTE — Therapy (Signed)
[place] MAIN [facility] SERVICES [address] [place], [place], [address] Phone: [phone]   Fax:  [phone]  Occupational Therapy Treatment  Patient Details  Name: Mario Liu MRN: [id number] Date of Birth: 01-Jan-1961 Referring Provider: Dr. Erling Cruz  Encounter Date: 06/10/2017      OT End of Session - 06/10/17 1451    Visit Number 3   Number of Visits 24   Date for OT Re-Evaluation 08/28/17   Authorization Type Medicare G code 3 of 10   OT Start Time 0160   OT Stop Time 1500   OT Time Calculation (min) 45 min   Activity Tolerance Patient tolerated treatment well   Behavior During Therapy [facility] for tasks assessed/performed      Past Medical History:  Diagnosis Date  . COPD (chronic obstructive pulmonary disease) ([place])   . Hepatitis C   . Hx of cholecystectomy   . Hypothyroidism   . Kidney cysts   . Major depressive disorder    Was treated at facility in [place] ([facility]) years ago  . Tobacco abuse   . Vitamin D deficiency     Past Surgical History:  Procedure Laterality Date  . LAPAROSCOPIC CHOLECYSTECTOMY      There were no vitals filed for this visit.      Subjective Assessment - 06/10/17 1449    Subjective  Pt. was late for the session, as they went to [facility].   Patient is accompained by: Family member   Pertinent History Pt. is a 56 y.o. 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 Pt. presents with residual UE, and LE weakness. Pt. was hospitalized, and transferred to inpatient Rehab at [facility]. Pt. is on a Dysphagia 2 diet with thickened liquids. Pt. resides in a [place]. Pt. PMHx includes: Down's Syndrome, Major Depressive Disorder, Constipation, and COPD.   Currently in Pain? No/denies      OT TREATMENT    Neuro muscular re-education:  Pt. performed [facility] skills training to improve speed and dexterity needed for  ADL tasks and writing. Pt. demonstrated grasping 1 inch sticks,  inch cylindrical collars, and  inch flat washers on the Purdue pegboard. Pt. performed grasping each item with her 2nd digit and thumb, and storing them in the palm. Pt. presented with difficulty storing  inch objects at a time in the palmar aspect of the hand.  Therapeutic Exercise:  Pt. performed gross gripping with grip strengthener. Pt. worked on sustaining grip while grasping pegs and reaching at various heights. Gripper was placed in the 2nd resistive slot with the white resistive spring. Pt. Worked on pinch strengthening in the left hand for lateral, and 3pt. pinch using yellow, red, green, and blue resistive clips. Pt. worked on placing the clips at various vertical and horizontal angles. Tactile and verbal cues were required for eliciting the desired movement.  Selfcare:  Pt. was independent with toileting skills, and self-grooming hand hygiene.                              OT Education - 06/10/17 1450    Education provided Yes   Education Details UE strength, and coordination skills.   Person(s) Educated Patient   Methods Explanation   Comprehension Verbalized understanding            OT Long Term  Goals - 06/05/17 1247      OT LONG TERM GOAL #1   Title Pt. will improve bilateral UE strength by 2 mm grades to assist with ADLs/IADLs.   Baseline Impaired UE strength   Time 12   Period Weeks   Status New   Target Date 08/28/17     OT LONG TERM GOAL #2   Title Pt. will improve bilateral grip strength to be able to hold aand use a vacuum   Baseline Limited bilateral grip strength   Time 12   Period Weeks   Status New   Target Date 08/28/17     OT LONG TERM GOAL #3   Title Pt. will improve bilateral hand [facility] skills to assist with manipulating ADL objects.   Baseline Limited bilateral [facility] skills   Time 12   Period Weeks   Status New   Target Date 08/28/17     OT LONG TERM  GOAL #4   Title Pt. will improve bilateral pinch strength to be able to independently open containers   Baseline Impaired pinch strength   Time 12   Period Weeks   Status New   Target Date 08/28/17     OT LONG TERM GOAL #5   Title Pt. will improve bilateral eye hand coordination skills to be able to dribble a basketball.   Baseline Impaired   Time 12   Period Weeks   Status New   Target Date 08/28/17               Plan - 06/10/17 1452    Clinical Impression Statement Pt. was late for the session today, as they went to [facility] instead of the [facility]. Pt. conitnues to present with limited UE strength, and coordination skills. Pt.  continues to work on improving UE strength, and coordination skills for improved ADL, and IADL functioning.   OT Frequency 2x / week   OT Duration 12 weeks   OT Treatment/Interventions Self-care/ADL training   Consulted and Agree with Plan of Care Patient      Patient will benefit from skilled therapeutic intervention in order to improve the following deficits and impairments:  Impaired UE functional use, Decreased knowledge of precautions, Decreased strength, Decreased range of motion, Decreased activity tolerance, Impaired perceived functional ability, Decreased coordination, Decreased knowledge of use of DME, Impaired vision/preception, Decreased cognition  Visit Diagnosis: Muscle weakness (generalized)  Other lack of coordination    Problem List Patient Active Problem List   Diagnosis Date Noted  . Major depressive disorder   . Hepatitis C antibody test positive 05/16/2017  . Acute ischemic left MCA stroke ([place])   . Down's syndrome   . PFO with atrial septal aneurysm     Harrel Carina, MS, OTR/L 06/10/2017, 3:05 PM  [place] MAIN [facility] SERVICES [address] [place], [place], [address] Phone: [phone]   Fax:  [phone]  Name: Mario Liu MRN: [id number] Date  of Birth: 04-22-1961

## 2017-06-10 NOTE — Telephone Encounter (Signed)
Attempted to reach supervisor at [facility] but no one is available to take phone call and no option at this time to leave a message.

## 2017-06-12 ENCOUNTER — Ambulatory Visit: Payer: Medicare Other | Admitting: Speech Pathology

## 2017-06-12 ENCOUNTER — Encounter: Payer: Self-pay | Admitting: Speech Pathology

## 2017-06-12 DIAGNOSIS — M6281 Muscle weakness (generalized): Secondary | ICD-10-CM | POA: Diagnosis not present

## 2017-06-12 DIAGNOSIS — R471 Dysarthria and anarthria: Secondary | ICD-10-CM

## 2017-06-12 DIAGNOSIS — R4701 Aphasia: Secondary | ICD-10-CM

## 2017-06-12 NOTE — Therapy (Signed)
[place] MAIN [facility] SERVICES [address] [place], [place], [address] Phone: [phone]   Fax:  [phone]  Speech Language Pathology Treatment  Patient Details  Name: Mario Liu MRN: [id number] Date of Birth: 10-03-1960 Referring Provider: Bary Leriche  Encounter Date: 06/12/2017      End of Session - 06/12/17 1602    Visit Number 3   Number of Visits 17   Date for SLP Re-Evaluation 08/01/17   SLP Start Time 1350   SLP Stop Time  1440   SLP Time Calculation (min) 50 min   Activity Tolerance Patient tolerated treatment well      Past Medical History:  Diagnosis Date  . COPD (chronic obstructive pulmonary disease) ([place])   . Hepatitis C   . Hx of cholecystectomy   . Hypothyroidism   . Kidney cysts   . Major depressive disorder    Was treated at facility in [place] ([facility]) years ago  . Tobacco abuse   . Vitamin D deficiency     Past Surgical History:  Procedure Laterality Date  . LAPAROSCOPIC CHOLECYSTECTOMY      There were no vitals filed for this visit.      Subjective Assessment - 06/12/17 1601    Subjective Patient participated willingly throughout the session   Currently in Pain? No/denies               ADULT SLP TREATMENT - 06/12/17 0001      General Information   Behavior/Cognition Alert;Cooperative;Pleasant mood;Requires cueing     Treatment Provided   Treatment provided Cognitive-Linquistic     Pain Assessment   Pain Assessment No/denies pain     Cognitive-Linquistic Treatment   Skilled Treatment Patient named items and was judged to be 40% intelligible following maximum verbal cues and models; answered biographical questions and was judged to be about 45% intelligible in his answers. Cognition: patient correctly followed commands (using pictures of objects) with 74% accuracy, including imitation and following moderate cueing; noted to identify the object but then had difficulty following  command.      Progression Toward Goals   Progression toward goals Progressing toward goals          SLP Education - 06/12/17 1601    Education provided Yes   Education Details need to speak slow and clear so people can understand what you are saying    Person(s) Educated Patient   Methods Explanation   Comprehension Verbalized understanding            SLP Long Term Goals - 06/01/17 1358      SLP LONG TERM GOAL #1   Title Pt will improve speech intelligibility for words/phrases by controlling rate of speech, over-articulation, correct phonetic placement, and increased loudness to achieve 80% intelligibility.   Time 8   Period Weeks   Status New   Target Date 08/01/17     SLP LONG TERM GOAL #2   Title Patient will complete 2 unit processing tasks with 80% accuracy without the need of repetition of task instructions or significant delays in responding.   Time 8   Period Weeks   Status New   Target Date 08/01/17     SLP LONG TERM GOAL #3   Title Patient will generate meaningful and intelligible phrase to complete simple/concrete linguistic task with 80% accuracy.   Time 8   Period Weeks   Status New   Target Date 08/01/17  Plan - 06/12/17 1603    Clinical Impression Statement The patient is presenting with dysarthria and aphasia characterized by unintelligible speech (due to imprecise articulation as well as phonological deviations), telegraphic/simplified speech, and inconsistent auditory comprehension for simple commands/questions. Slight improvement was noted for intelligibility and following commands, but overall still low. The patient will continue to benefit from skilled speech therapy to maximize functional communication.   Speech Therapy Frequency 2x / week   Duration Other (comment)   Treatment/Interventions Patient/family education;SLP instruction and feedback;Functional tasks   Potential to Achieve Goals Fair   Potential Considerations Ability to  learn/carryover information;Co-morbidities;Previous level of function;Family/community support;Medical prognosis;Severity of impairments;Cooperation/participation level   SLP Home Exercise Plan practice speaking slowly and clearly    Consulted and Agree with Plan of Care Patient      Patient will benefit from skilled therapeutic intervention in order to improve the following deficits and impairments:   Dysarthria and anarthria  Aphasia    Problem List Patient Active Problem List   Diagnosis Date Noted  . Major depressive disorder   . Hepatitis C antibody test positive 05/16/2017  . Acute ischemic left MCA stroke ([place])   . Down's syndrome   . PFO with atrial septal aneurysm     [name] [place] 06/12/2017, 4:08 PM  [place] MAIN [facility] SERVICES [address] [place], [place], [address] Phone: [phone]   Fax:  [phone]   Name: Mario Liu MRN: [id number] Date of Birth: 05-17-1961

## 2017-06-12 NOTE — Telephone Encounter (Signed)
I attempted to reach the supervisor at [facility] but call cannot be taken and you cannot leave a message per voicemail.

## 2017-06-16 ENCOUNTER — Ambulatory Visit: Payer: Medicare Other | Admitting: Physical Therapy

## 2017-06-16 ENCOUNTER — Encounter: Payer: Self-pay | Admitting: Physical Therapy

## 2017-06-16 ENCOUNTER — Ambulatory Visit: Payer: Medicare Other | Attending: Physical Medicine and Rehabilitation | Admitting: Occupational Therapy

## 2017-06-16 DIAGNOSIS — R278 Other lack of coordination: Secondary | ICD-10-CM | POA: Diagnosis present

## 2017-06-16 DIAGNOSIS — R471 Dysarthria and anarthria: Secondary | ICD-10-CM | POA: Insufficient documentation

## 2017-06-16 DIAGNOSIS — M6281 Muscle weakness (generalized): Secondary | ICD-10-CM | POA: Diagnosis present

## 2017-06-16 DIAGNOSIS — R4701 Aphasia: Secondary | ICD-10-CM | POA: Diagnosis present

## 2017-06-16 DIAGNOSIS — R2681 Unsteadiness on feet: Secondary | ICD-10-CM

## 2017-06-16 NOTE — Therapy (Signed)
[place] MAIN [facility] SERVICES [address] [place], [place], [address] Phone: [phone]   Fax:  [phone]  Occupational Therapy Treatment  Patient Details  Name: Mario Liu MRN: [id number] Date of Birth: 1961/04/27 Referring Provider: Dr. Erling Cruz  Encounter Date: 06/16/2017      OT End of Session - 06/16/17 1045    Visit Number 4   Number of Visits 24   Date for OT Re-Evaluation 08/28/17   Authorization Type Medicare G code 4 of 10   OT Start Time 1015   OT Stop Time 1100   OT Time Calculation (min) 45 min   Activity Tolerance Patient tolerated treatment well   Behavior During Therapy [facility] for tasks assessed/performed      Past Medical History:  Diagnosis Date  . COPD (chronic obstructive pulmonary disease) ([place])   . Hepatitis C   . Hx of cholecystectomy   . Hypothyroidism   . Kidney cysts   . Major depressive disorder    Was treated at facility in [place] ([facility]) years ago  . Tobacco abuse   . Vitamin D deficiency     Past Surgical History:  Procedure Laterality Date  . LAPAROSCOPIC CHOLECYSTECTOMY      There were no vitals filed for this visit.      Subjective Assessment - 06/16/17 1039    Subjective  Pt. was fatigued during the session   Patient is accompained by: Family member   Pertinent History Pt. is a 56 y.o. 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 Pt. presents with residual UE, and LE weakness. Pt. was hospitalized, and transferred to inpatient Rehab at [facility]. Pt. is on a Dysphagia 2 diet with thickened liquids. Pt. resides in a [place]. Pt. PMHx includes: Down's Syndrome, Major Depressive Disorder, Constipation, and COPD.   Currently in Pain? No/denies      OT TREATMENT    Neuro muscular re-education:  Pt. performed [facility] skills training to improve speed and dexterity needed for ADL tasks and writing. Pt.  demonstrated grasping 1 inch sticks,  inch cylindrical collars, and  inch flat washers on the Purdue pegboard. Pt. performed grasping each item with his 2nd digit, and thumb. Pt. required verbal, and visual cues for sequencing.  Therapeutic Exercise:  Pt. worked on the [employer] for 8 min. with constant monitoring of the BUEs. Pt. worked on changing, and alternating forward reverse position every 2 min. Rest breaks were required, as well as cues for hand placement. Pt. Worked on pinch strengthening in the left hand for lateral, and 3pt. pinch using yellow, red, green, and blue resistive clips. Pt. worked on placing the clips at various vertical and horizontal angles. Tactile and verbal cues were required for eliciting the desired movement, and hand position. Pt. performed gross gripping with grip strengthener. Pt. worked on sustaining grip while grasping pegs and reaching at various heights. Gripper was placed in the 3rd resistive slot with the white resistive spring.    :                           OT Education - 06/16/17 1044    Education provided Yes   Education Details RUE strength, abd coordination   Person(s) Educated Patient   Methods Explanation   Comprehension Verbalized understanding  OT Long Term Goals - 06/05/17 1247      OT LONG TERM GOAL #1   Title Pt. will improve bilateral UE strength by 2 mm grades to assist with ADLs/IADLs.   Baseline Impaired UE strength   Time 12   Period Weeks   Status New   Target Date 08/28/17     OT LONG TERM GOAL #2   Title Pt. will improve bilateral grip strength to be able to hold aand use a vacuum   Baseline Limited bilateral grip strength   Time 12   Period Weeks   Status New   Target Date 08/28/17     OT LONG TERM GOAL #3   Title Pt. will improve bilateral hand [facility] skills to assist with manipulating ADL objects.   Baseline Limited bilateral [facility] skills   Time 12   Period Weeks   Status New    Target Date 08/28/17     OT LONG TERM GOAL #4   Title Pt. will improve bilateral pinch strength to be able to independently open containers   Baseline Impaired pinch strength   Time 12   Period Weeks   Status New   Target Date 08/28/17     OT LONG TERM GOAL #5   Title Pt. will improve bilateral eye hand coordination skills to be able to dribble a basketball.   Baseline Impaired   Time 12   Period Weeks   Status New   Target Date 08/28/17               Plan - 06/16/17 1046    Clinical Impression Statement Pt. presented with fatigue initially during the session with several long yawns. Pt. continues to present with limited RUE strength, and coordination skills. Pt. continues to work on improving RUE strength, and coordination skills for improved ADL, and IADL functioning.    Occupational performance deficits (Please refer to evaluation for details): ADL's;IADL's   Rehab Potential Good   OT Frequency 2x / week   OT Duration 12 weeks   OT Treatment/Interventions Self-care/ADL training;Therapeutic exercise;Neuromuscular education;DME and/or AE instruction;Therapeutic activities;Therapeutic exercises   Clinical Decision Making Several treatment options, min-mod task modification necessary   Consulted and Agree with Plan of Care Patient      Patient will benefit from skilled therapeutic intervention in order to improve the following deficits and impairments:  Impaired UE functional use, Decreased knowledge of precautions, Decreased strength, Decreased range of motion, Decreased activity tolerance, Impaired perceived functional ability, Decreased coordination, Decreased knowledge of use of DME, Impaired vision/preception, Decreased cognition  Visit Diagnosis: Muscle weakness (generalized)  Other lack of coordination    Problem List Patient Active Problem List   Diagnosis Date Noted  . Major depressive disorder   . Hepatitis C antibody test positive 05/16/2017  . Acute  ischemic left MCA stroke ([place])   . Down's syndrome   . PFO with atrial septal aneurysm     Harrel Carina, MS, OTR/L 06/16/2017, 11:38 AM  [place] MAIN [facility] SERVICES [address] [place], [place], [address] Phone: [phone]   Fax:  [phone]  Name: Arkel Cartwright MRN: [id number] Date of Birth: [birth year]

## 2017-06-16 NOTE — Therapy (Signed)
[place] MAIN [facility] SERVICES [address] [place], [place], [address] Phone: [phone]   Fax:  [phone]  Physical Therapy Treatment  Patient Details  Name: Mario Liu MRN: [id number] Date of Birth: November 12, 1960 Referring Provider: Bary Leriche  Encounter Date: 06/16/2017      PT End of Session - 06/16/17 1116    Visit Number 5   Number of Visits 25   Date for PT Re-Evaluation 08/20/17   Authorization Type 5/10 g code   PT Start Time 1110   PT Stop Time 1155   PT Time Calculation (min) 45 min   Equipment Utilized During Treatment Gait belt   Activity Tolerance Patient tolerated treatment well;Patient limited by fatigue   Behavior During Therapy [facility] for tasks assessed/performed      Past Medical History:  Diagnosis Date  . COPD (chronic obstructive pulmonary disease) ([place])   . Hepatitis C   . Hx of cholecystectomy   . Hypothyroidism   . Kidney cysts   . Major depressive disorder    Was treated at facility in [place] (D and G) years ago  . Tobacco abuse   . Vitamin D deficiency     Past Surgical History:  Procedure Laterality Date  . LAPAROSCOPIC CHOLECYSTECTOMY      There were no vitals filed for this visit.      Subjective Assessment - 06/16/17 1116    Subjective Patient reports doing well; no new falls; reports compliance with HEP;    Patient is accompained by: --  Group home administrator    Pertinent History Pt is a 56 yo w/Down syndrome w/CVA on 05/14/17 resulting in weakness of R UE and LE and mild weakness of L UE and LE.  Pt also had dysphagia prior to stroke and is own dyspagia 2 diet and thickened liquids due to risk of aspiration.  Pt has hx of    Limitations Standing;Walking   How long can you sit comfortably? unlimited   How long can you stand comfortably? 30 minutes or less before needing to rest   How long can you walk comfortably? 10 minutes before needing to rest   Patient Stated Goals "better  balance"    Currently in Pain? No/denies           TREATMENT: Warm up on Nustep BUE/BLE level 2 x5 min (Unbilled);  Balance: Standing on airex: Modified tandem stance: Ball pass side/side x10 reps each foot in front; required increased time and cues to get into position as patient hesitant to stand with narrow base of support; Requires min A for balance;  Modified tandem stance: eyes closed/open 10 sec hold x3 each foot in front; Feet together: BUE ball up/down x5 reps Patient required min VCs for balance stability, including to increase trunk control for less loss of balance with smaller base of support  Ladder drills: Forward reciprocal x2 laps, supervision;  Side stepping x1 laps each direction with mod VCs and demonstration for sequencing and positioning; Requires min-mod A for balance, patient hesitant to stay sideways and exhibits wide base of support;  One step in, one step out x2 laps each direction with mod VCs for sequencing and foot placement;  Standing on 1/2 bolster (flat side up): BUE wand flexion x10 reps; requires min A for safety and cues to improve stance control;  Attempted heel/toe raises/rock but patient had difficulty keeping knees straight for better ankle stretch; exercise discontinued due to confusion;   Resisted walking: 12.5# forward/backward,  side/side 2 way, x2 laps each with min A for balance and cues to increase step length for better gait safety;   Exercise: Seated green tband hip flexion 2x10 with visual cues to increase ROM for better strengthening;  Seated green tband hip abduction/ER x15 with min VCs to slow down exercise for better hip abduction activation; Sit<>Stand from regular chair holding ball x10 reps with cues to increase forward lean for better transfer ability;                      PT Education - 06/16/17 1307    Education provided Yes   Education Details strengthening,balance, HEP reinforced;    Person(s)  Educated Patient   Methods Explanation;Demonstration;Verbal cues   Comprehension Verbalized understanding;Returned demonstration;Verbal cues required;Need further instruction          PT Short Term Goals - 05/28/17 1426      PT SHORT TERM GOAL #1   Title Pt will demonstrate independence w/HEP in order to increase strength and balance to reduce overall risk of falls.   Time 6   Period Weeks   Status New   Target Date 07/09/17     PT SHORT TERM GOAL #2   Title Pt will improve decrease 5 time sit to stand to <17 seconds in order to demonstrate increased LE strength and balance.   Baseline 19 sec   Time 6   Period Weeks   Status New   Target Date 07/09/17     PT SHORT TERM GOAL #3   Title Pt will improve gait speed measured by 10 m WT to 0.8 m/s to demonstrate increased LE strength and functional mobility in order to decrease risk of falls and increase participation in group home activities.   Baseline 0.71 m/s   Time 6   Period Weeks   Status New   Target Date 07/09/17     PT SHORT TERM GOAL #4   Title Pt will improve TUG to < 15 seconds in order to demonstrate improved balance and functional activity tolerance.   Baseline 17 sec   Time 6   Period Weeks   Status New   Target Date 07/09/17     PT SHORT TERM GOAL #5   Title Pt will improve Berg Balance score by at least 4 points in order to demonstrate improved dynamic and static, reducing overall falls risk.   Baseline 36/56   Time 6   Period Weeks   Status New   Target Date 07/09/17           PT Long Term Goals - 05/28/17 1432      PT LONG TERM GOAL #1   Title Pt will improve B LE strength to at least 4/5 in all limited planes in order to increase funtional activity tolerance and return to group home activities.    Baseline gross strength assessment: 3/5   Time 12   Period Weeks   Status New   Target Date 08/20/17     PT LONG TERM GOAL #2   Title Pt will improve 5 time sit to stand to < 15 seconds in  order to demonstrate increase in LE strength and balance.   Baseline 19 sec   Time 12   Period Weeks   Status New   Target Date 07/09/17     PT LONG TERM GOAL #3   Title Pt will incrase gait speed measured by 10 m WT to at least 1.0 m/s in  order to demonstrate improved balance and LE strength and to decrease overall risk for falls.   Baseline 0.71 m/s   Time 12   Period Weeks   Status New   Target Date 08/20/17     PT LONG TERM GOAL #4   Title Pt will improve TUG to < 13 seconds in order to demonstrate an increase in LE strength and improved dynamic balance.   Baseline 17 seconds   Time 12   Period Weeks   Status New   Target Date 08/20/17     PT LONG TERM GOAL #5   Title Pt will demonstrate an increase in Berg Balance Assessment by at least 10 points in order to demonstrate improved dynamic and static balance, reducing overall risk of falls.   Baseline 36/56   Time 12   Period Weeks   Status New   Target Date 08/20/17               Plan - 06/16/17 1310    Clinical Impression Statement Patient instructed in advanced LE strengthening and balance exercise. He requires increased time and mod Cues/demonstration for correct exercise technique and positioning. He had increased difficulty with narrow base of support today, requiring min A while on airex/doing ladder drills. Patient would benefit from additional skilled PT intervention to improve strength, balance and gait safety;    Rehab Potential Good   Clinical Impairments Affecting Rehab Potential transportation limitations, living situation   PT Frequency 2x / week   PT Duration 12 weeks   PT Treatment/Interventions Cryotherapy;Moist Heat;Gait training;Therapeutic activities;Therapeutic exercise;Balance training;Neuromuscular re-education;Patient/family education;Manual techniques;Passive range of motion;Dry needling   PT Next Visit Plan 6 MWT to assess endurance; begin strength and balance exercises    PT Home Exercise  Plan review HEP, basketball pass on airex pad, walking football/basketball pass   Consulted and Agree with Plan of Care Patient;Other (Comment)      Patient will benefit from skilled therapeutic intervention in order to improve the following deficits and impairments:  Abnormal gait, Decreased activity tolerance, Decreased balance, Decreased coordination, Decreased endurance, Decreased mobility, Decreased safety awareness, Decreased strength, Difficulty walking, Decreased cognition  Visit Diagnosis: Muscle weakness (generalized)  Other lack of coordination  Unsteadiness on feet     Problem List Patient Active Problem List   Diagnosis Date Noted  . Major depressive disorder   . Hepatitis C antibody test positive 05/16/2017  . Acute ischemic left MCA stroke ([place])   . Down's syndrome   . PFO with atrial septal aneurysm     Trotter,Margaret PT, DPT 06/16/2017, 1:12 PM  [place] MAIN [facility] SERVICES [address] [place], [place], [address] Phone: [phone]   Fax:  [phone]  Name: Valiant Dills MRN: [id number] Date of Birth: 02-22-1961

## 2017-06-19 ENCOUNTER — Encounter: Payer: Self-pay | Admitting: Occupational Therapy

## 2017-06-19 ENCOUNTER — Ambulatory Visit: Payer: Medicare Other | Admitting: Speech Pathology

## 2017-06-19 ENCOUNTER — Encounter: Payer: Self-pay | Admitting: Speech Pathology

## 2017-06-19 ENCOUNTER — Ambulatory Visit: Payer: Medicare Other | Admitting: Occupational Therapy

## 2017-06-19 ENCOUNTER — Ambulatory Visit: Payer: Medicare Other

## 2017-06-19 DIAGNOSIS — R2681 Unsteadiness on feet: Secondary | ICD-10-CM

## 2017-06-19 DIAGNOSIS — R278 Other lack of coordination: Secondary | ICD-10-CM

## 2017-06-19 DIAGNOSIS — R471 Dysarthria and anarthria: Secondary | ICD-10-CM

## 2017-06-19 DIAGNOSIS — R4701 Aphasia: Secondary | ICD-10-CM

## 2017-06-19 DIAGNOSIS — M6281 Muscle weakness (generalized): Secondary | ICD-10-CM

## 2017-06-19 NOTE — Therapy (Signed)
[place] MAIN [facility] SERVICES [address] [place], [place], [address] Phone: [phone]   Fax:  [phone]  Speech Language Pathology Treatment  Patient Details  Name: Mario Liu MRN: [id number] Date of Birth: 01-10-1961 Referring Provider: Bary Leriche  Encounter Date: 06/19/2017      End of Session - 06/19/17 1250    Visit Number 4   Number of Visits 17   Date for SLP Re-Evaluation 08/01/17   SLP Start Time 1000   SLP Stop Time  1051   SLP Time Calculation (min) 51 min   Activity Tolerance Patient tolerated treatment well      Past Medical History:  Diagnosis Date  . COPD (chronic obstructive pulmonary disease) ([place])   . Hepatitis C   . Hx of cholecystectomy   . Hypothyroidism   . Kidney cysts   . Major depressive disorder    Was treated at facility in [place] (D and G) years ago  . Tobacco abuse   . Vitamin D deficiency     Past Surgical History:  Procedure Laterality Date  . LAPAROSCOPIC CHOLECYSTECTOMY      There were no vitals filed for this visit.      Subjective Assessment - 06/19/17 1239    Subjective Patient replied "hard" when asked if he thought it was easy or hard for people to understand him; when asked why we were practicing speaking clearly, he responded "so people can understand me"   Currently in Pain? No/denies               ADULT SLP TREATMENT - 06/19/17 0001      General Information   Behavior/Cognition Alert;Cooperative;Pleasant mood;Requires cueing     Treatment Provided   Treatment provided Cognitive-Linquistic     Pain Assessment   Pain Assessment No/denies pain     Cognitive-Linquistic Treatment   Skilled Treatment Patient named items and was judged to be 38% intelligible following maximum verbal cues and models; answered conversational questions and was judged to be about 50% intelligible in his answers (noted some of his answers were inconsistent with pervious answers  from previous sessions as well as earlier in the session). Cognition: patient correctly answered yes/no questions with 79% accuracy, with greater accuracy on questions with "no" as the correct answer. Patient correctly followed commands (using pictures of objects) with 68% accuracy, including imitation and following moderate cueing; noted to identify the object but then had difficulty following command.     Progression Toward Goals   Progression toward goals Progressing toward goals          SLP Education - 06/19/17 1239    Education Details working on improving "speaking clearly" so people can understand you better   Person(s) Educated Patient   Methods Explanation   Comprehension Verbalized understanding            SLP Long Term Goals - 06/01/17 1358      SLP LONG TERM GOAL #1   Title Pt will improve speech intelligibility for words/phrases by controlling rate of speech, over-articulation, correct phonetic placement, and increased loudness to achieve 80% intelligibility.   Time 8   Period Weeks   Status New   Target Date 08/01/17     SLP LONG TERM GOAL #2   Title Patient will complete 2 unit processing tasks with 80% accuracy without the need of repetition of task instructions or significant delays in responding.   Time 8   Period Weeks   Status  New   Target Date 08/01/17     SLP LONG TERM GOAL #3   Title Patient will generate meaningful and intelligible phrase to complete simple/concrete linguistic task with 80% accuracy.   Time 8   Period Weeks   Status New   Target Date 08/01/17          Plan - 06/19/17 1248    Clinical Impression Statement The patient is presenting with dysarthria and aphasia characterized by unintelligible speech (due to imprecise articulation as well as phonological deviations), telegraphic/simplified speech, and inconsistent auditory comprehension for simple commands/questions. Slight improvement was noted for yes/no questions and  intelligibility for certain words, but overall still low. The patient will continue to benefit from skilled speech therapy to maximize functional communication.   Speech Therapy Frequency 2x / week   Duration Other (comment)   Treatment/Interventions Patient/family education;SLP instruction and feedback;Functional tasks   Potential to Achieve Goals Fair   Potential Considerations Ability to learn/carryover information;Co-morbidities;Previous level of function;Family/community support;Medical prognosis;Severity of impairments;Cooperation/participation level   SLP Home Exercise Plan practice speaking slowly and clearly    Consulted and Agree with Plan of Care Patient      Patient will benefit from skilled therapeutic intervention in order to improve the following deficits and impairments:   Dysarthria and anarthria  Aphasia    Problem List Patient Active Problem List   Diagnosis Date Noted  . Major depressive disorder   . Hepatitis C antibody test positive 05/16/2017  . Acute ischemic left MCA stroke ([place])   . Down's syndrome   . PFO with atrial septal aneurysm    This entire session was performed under direct supervision and direction of a licensed therapist/therapist assistant . I have personally read, edited and approve of the note as written.  Charlean Sanfilippo, [place], CCC-SLP  Speech-Language Pathologist    Louanne Calvillo [place] 06/19/2017, 12:51 PM  [place] MAIN [facility] SERVICES [address] [place], [place], [address] Phone: [phone]   Fax:  [phone]   Name: Sadiq Mccauley MRN: [id number] Date of Birth: [birth year]

## 2017-06-19 NOTE — Therapy (Signed)
Mario Liu [address] [place], [place], [address] Phone: [phone]   Fax:  [phone]  Occupational Therapy Treatment  Patient Details  Name: Mario Liu MRN: [id number] Date of Birth: [birth year] Referring Provider: Dr. Erling Cruz  Encounter Date: 06/19/2017      OT End of Session - 06/19/17 1132    Visit Number 5   Number of Visits 24   Date for OT Re-Evaluation 08/28/17   Authorization Type Medicare G code 5 of 10   OT Start Time 1102   OT Stop Time 1145   OT Time Calculation (min) 43 min   Activity Tolerance Patient tolerated treatment well   Behavior During Therapy [facility] for tasks assessed/performed      Past Medical History:  Diagnosis Date  . COPD (chronic obstructive pulmonary disease) ([place])   . Hepatitis C   . Hx of cholecystectomy   . Hypothyroidism   . Kidney cysts   . Major depressive disorder    Was treated at facility in [place] (D and G) years ago  . Tobacco abuse   . Vitamin D deficiency     Past Surgical History:  Procedure Laterality Date  . LAPAROSCOPIC CHOLECYSTECTOMY      There were no vitals filed for this visit.      Subjective Assessment - 06/19/17 1130    Subjective  Pt. was alert during session.   Patient is accompained by: Family member   Pertinent History Pt. is a 56 y.o. 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 Pt. presents with residual UE, and LE weakness. Pt. was hospitalized, and transferred to inpatient Rehab at [facility]. Pt. is on a Dysphagia 2 diet with thickened liquids. Pt. resides in a [place]. Pt. PMHx includes: Down's Syndrome, Major Depressive Disorder, Constipation, and COPD.   Currently in Pain? No/denies         OT TREATMENT   Neuro muscular re-education:  Pt. worked on grasping, flipping and stacking 2" large pegs on the Instructo board placed at a tabletop  surface. Pt. requires verbal cues for proper technique, and visual demonstration.  Therapeutic Exercise:  Pt. worked on the [employer] for 8 min. with constant monitoring of the BUEs. Pt. worked on changing, and alternating forward reverse position every 2 min. rest breaks were required. Pt. Requires verbal cues for hand placement. Pt. performed 2# dowel ex. For UE strengthening secondary to weakness. Bilateral shoulder flexion, chest press, circular patterns, and elbow flexion/extension were performed. Pt. requires frequent consistent cues for speed, and technique.                             OT Education - 06/19/17 1131    Education provided Yes   Education Details UE strengthening, and coordination skills.   Person(s) Educated Patient   Methods Explanation;Demonstration;Verbal cues;Tactile cues   Comprehension Verbalized understanding;Returned demonstration             OT Long Term Goals - 06/05/17 1247      OT LONG TERM GOAL #1   Title Pt. will improve bilateral UE strength by 2 mm grades to assist with ADLs/IADLs.   Baseline Impaired UE strength   Time 12   Period Weeks   Status New   Target Date 08/28/17     OT LONG TERM GOAL #2  Title Pt. will improve bilateral grip strength to be able to hold aand use a vacuum   Baseline Limited bilateral grip strength   Time 12   Period Weeks   Status New   Target Date 08/28/17     OT LONG TERM GOAL #3   Title Pt. will improve bilateral hand [facility] skills to assist with manipulating ADL objects.   Baseline Limited bilateral [facility] skills   Time 12   Period Weeks   Status New   Target Date 08/28/17     OT LONG TERM GOAL #4   Title Pt. will improve bilateral pinch strength to be able to independently open containers   Baseline Impaired pinch strength   Time 12   Period Weeks   Status New   Target Date 08/28/17     OT LONG TERM GOAL #5   Title Pt. will improve bilateral eye hand coordination skills to be  able to dribble a basketball.   Baseline Impaired   Time 12   Period Weeks   Status New   Target Date 08/28/17               Plan - 06/19/17 1133    Clinical Impression Statement Pt. conitnues to present with limited UE strength, and coordination skills which limit his ability to complete daily ADL, and IADL tasks.  Pt. continues to work on improving UE strength, and coordination tasks for improved engagement in ADL, and IADL tasks.   Occupational performance deficits (Please refer to evaluation for details): ADL's;IADL's   Rehab Potential Good   OT Frequency 2x / week   OT Duration 12 weeks   OT Treatment/Interventions Self-care/ADL training;Therapeutic exercise;Neuromuscular education;DME and/or AE instruction;Therapeutic activities;Therapeutic exercises   Consulted and Agree with Plan of Care Patient      Patient will benefit from skilled therapeutic intervention in order to improve the following deficits and impairments:  Impaired UE functional use, Decreased knowledge of precautions, Decreased strength, Decreased range of motion, Decreased activity tolerance, Impaired perceived functional ability, Decreased coordination, Decreased knowledge of use of DME, Impaired vision/preception, Decreased cognition  Visit Diagnosis: Muscle weakness (generalized)  Other lack of coordination    Problem List Patient Active Problem List   Diagnosis Date Noted  . Major depressive disorder   . Hepatitis C antibody test positive 05/16/2017  . Acute ischemic left MCA stroke ([place])   . Down's syndrome   . PFO with atrial septal aneurysm     Harrel Carina, MS, OTR/L 06/19/2017, 11:50 AM  [place] MAIN [facility] [place], [place], [address] Phone: [phone]   Fax:  [phone]  Name: Mario Liu MRN: [id number] Date of Birth: Nov 21, 1960

## 2017-06-19 NOTE — Therapy (Signed)
[place] MAIN [facility] SERVICES [address] [place], [place], [address] Phone: [phone]   Fax:  [phone]  Physical Therapy Treatment  Patient Details  Name: Mario Liu MRN: [id number] Date of Birth: July 18, 1961 Referring Provider: Bary Leriche  Encounter Date: 06/19/2017      PT End of Session - 06/19/17 0919    Visit Number 6   Number of Visits 25   Date for PT Re-Evaluation 08/20/17   Authorization Type 6/10 g code   PT Start Time 0913   PT Stop Time 1000   PT Time Calculation (min) 47 min   Equipment Utilized During Treatment Gait belt   Activity Tolerance Patient tolerated treatment well;Patient limited by fatigue   Behavior During Therapy [facility] for tasks assessed/performed      Past Medical History:  Diagnosis Date  . COPD (chronic obstructive pulmonary disease) ([place])   . Hepatitis C   . Hx of cholecystectomy   . Hypothyroidism   . Kidney cysts   . Major depressive disorder    Was treated at facility in [place] (D and G) years ago  . Tobacco abuse   . Vitamin D deficiency     Past Surgical History:  Procedure Laterality Date  . LAPAROSCOPIC CHOLECYSTECTOMY      There were no vitals filed for this visit.      Subjective Assessment - 06/19/17 0918    Subjective Patient reports four falls in the recent past, says fell on leg and hurt at the time but does not hurt anymore.    Patient is accompained by: --  Group home administrator    Pertinent History Pt is a 56 yo w/Down syndrome w/CVA on 05/14/17 resulting in weakness of R UE and LE and mild weakness of L UE and LE.  Pt also had dysphagia prior to stroke and is own dyspagia 2 diet and thickened liquids due to risk of aspiration.  Pt has hx of    Limitations Standing;Walking   How long can you sit comfortably? unlimited   How long can you stand comfortably? 30 minutes or less before needing to rest   How long can you walk comfortably? 10 minutes before needing to  rest   Patient Stated Goals "better balance"    Currently in Pain? No/denies        TREATMENT: Warm up on Nustep BUE/BLE level 2 x3 min (Unbilled);   Neuro Re-ed airex pad: passing basketball within and outside of COM with no LOB x 40 passes airex pad: balloon taps 20x , no LOB, good control and utilization of bilateral hands within and outside of BOS   TherEx Sit to stands with 5lb bar overhead press from plinth table 15x, visual cues and single verbal cues for task sequencing.    Resisted walking: 12.5# side/side 2 way, x2 laps each with min A for balance and cues to increase step length for better gait safety; occasional stumbles returning with LLE.   Side step ups onto 6" step 10x each side, verbal and visual cueing no UE support.   High knee marches in // bars no UE support 4x length of // bars. PT demonstrating besides pt.   Side squat walks in // bars 4x with PT providing visual cueing for sequencing.   Kicking soccer ball in // bars with Verbal cues for which leg to use, good knee extension and aim with RLE, decreased coordination with LLE.  Patient required CGA throughout session today and frequent  verbal cueing for increased BOS due to tendency to prefer narrow BOS.   Pt. response to medical necessity: . Patient will continue to benefit from skilled physical therapy to improve strength, balance, and gait safety.                        PT Education - 06/19/17 [phone]    Education provided Yes   Education Details strengthening and balance for decreased fall risk   Person(s) Educated Patient   Methods Explanation;Demonstration;Verbal cues;Tactile cues   Comprehension Verbalized understanding;Returned demonstration          PT Short Term Goals - 05/28/17 1426      PT SHORT TERM GOAL #1   Title Pt will demonstrate independence w/HEP in order to increase strength and balance to reduce overall risk of falls.   Time 6   Period Weeks   Status New    Target Date 07/09/17     PT SHORT TERM GOAL #2   Title Pt will improve decrease 5 time sit to stand to <17 seconds in order to demonstrate increased LE strength and balance.   Baseline 19 sec   Time 6   Period Weeks   Status New   Target Date 07/09/17     PT SHORT TERM GOAL #3   Title Pt will improve gait speed measured by 10 m WT to 0.8 m/s to demonstrate increased LE strength and functional mobility in order to decrease risk of falls and increase participation in group home activities.   Baseline 0.71 m/s   Time 6   Period Weeks   Status New   Target Date 07/09/17     PT SHORT TERM GOAL #4   Title Pt will improve TUG to < 15 seconds in order to demonstrate improved balance and functional activity tolerance.   Baseline 17 sec   Time 6   Period Weeks   Status New   Target Date 07/09/17     PT SHORT TERM GOAL #5   Title Pt will improve Berg Balance score by at least 4 points in order to demonstrate improved dynamic and static, reducing overall falls risk.   Baseline 36/56   Time 6   Period Weeks   Status New   Target Date 07/09/17           PT Long Term Goals - 05/28/17 1432      PT LONG TERM GOAL #1   Title Pt will improve B LE strength to at least 4/5 in all limited planes in order to increase funtional activity tolerance and return to group home activities.    Baseline gross strength assessment: 3/5   Time 12   Period Weeks   Status New   Target Date 08/20/17     PT LONG TERM GOAL #2   Title Pt will improve 5 time sit to stand to < 15 seconds in order to demonstrate increase in LE strength and balance.   Baseline 19 sec   Time 12   Period Weeks   Status New   Target Date 07/09/17     PT LONG TERM GOAL #3   Title Pt will incrase gait speed measured by 10 m WT to at least 1.0 m/s in order to demonstrate improved balance and LE strength and to decrease overall risk for falls.   Baseline 0.71 m/s   Time 12   Period Weeks   Status New   Target Date 08/20/17  PT LONG TERM GOAL #4   Title Pt will improve TUG to < 13 seconds in order to demonstrate an increase in LE strength and improved dynamic balance.   Baseline 17 seconds   Time 12   Period Weeks   Status New   Target Date 08/20/17     PT LONG TERM GOAL #5   Title Pt will demonstrate an increase in Berg Balance Assessment by at least 10 points in order to demonstrate improved dynamic and static balance, reducing overall risk of falls.   Baseline 36/56   Time 12   Period Weeks   Status New   Target Date 08/20/17               Plan - 06/19/17 1030    Clinical Impression Statement Patient's LLE weakness in comparison to RLE is noted throughout session with patient's decreased weight acceptance and decreased tolerance of activities with LLE. Static balance is progressing with patient performing tasks within and outside of COM without LOB. Patient will continue to benefit from skilled physical therapy to improve strength, balance, and gait safety.    Rehab Potential Good   Clinical Impairments Affecting Rehab Potential transportation limitations, living situation   PT Frequency 2x / week   PT Duration 12 weeks   PT Treatment/Interventions Cryotherapy;Moist Heat;Gait training;Therapeutic activities;Therapeutic exercise;Balance training;Neuromuscular re-education;Patient/family education;Manual techniques;Passive range of motion;Dry needling   PT Next Visit Plan 6 MWT to assess endurance; begin strength and balance exercises    PT Home Exercise Plan review HEP, basketball pass on airex pad, walking football/basketball pass   Consulted and Agree with Plan of Care Patient;Other (Comment)      Patient will benefit from skilled therapeutic intervention in order to improve the following deficits and impairments:  Abnormal gait, Decreased activity tolerance, Decreased balance, Decreased coordination, Decreased endurance, Decreased mobility, Decreased safety awareness, Decreased strength,  Difficulty walking, Decreased cognition  Visit Diagnosis: Muscle weakness (generalized)  Other lack of coordination  Unsteadiness on feet     Problem List Patient Active Problem List   Diagnosis Date Noted  . Major depressive disorder   . Hepatitis C antibody test positive 05/16/2017  . Acute ischemic left MCA stroke ([place])   . Down's syndrome   . PFO with atrial septal aneurysm    Janna Arch, PT, DPT   Janna Arch 06/19/2017, 10:31 AM  [place] MAIN [facility] SERVICES [place], [place], [address] Phone: [phone]   Fax:  [phone]  Name: Mario Liu MRN: [id number] Date of Birth: March 21, 1961

## 2017-06-19 NOTE — Telephone Encounter (Signed)
I spoke with Montine Circle (staff member) at [facility] and due to the pt being in therapy every Monday and Friday we arranged an appointment on Thursday, 10/25 at 11:00 AM with Dr Burt Knack.

## 2017-06-22 ENCOUNTER — Ambulatory Visit: Payer: Medicare Other | Admitting: Occupational Therapy

## 2017-06-22 ENCOUNTER — Ambulatory Visit: Payer: Medicare Other | Admitting: Speech Pathology

## 2017-06-22 ENCOUNTER — Ambulatory Visit: Payer: Medicare Other | Admitting: Physical Therapy

## 2017-06-22 ENCOUNTER — Encounter: Payer: Self-pay | Admitting: Physical Therapy

## 2017-06-22 DIAGNOSIS — R4701 Aphasia: Secondary | ICD-10-CM

## 2017-06-22 DIAGNOSIS — R2681 Unsteadiness on feet: Secondary | ICD-10-CM

## 2017-06-22 DIAGNOSIS — R278 Other lack of coordination: Secondary | ICD-10-CM

## 2017-06-22 DIAGNOSIS — M6281 Muscle weakness (generalized): Secondary | ICD-10-CM

## 2017-06-22 DIAGNOSIS — R471 Dysarthria and anarthria: Secondary | ICD-10-CM

## 2017-06-22 NOTE — Therapy (Signed)
[place] MAIN [facility] SERVICES [address] [place], [place], [address] Phone: [phone]   Fax:  [phone]  Physical Therapy Treatment  Patient Details  Name: Mario Liu MRN: [id number] Date of Birth: 07-01-1961 Referring Provider: Bary Leriche  Encounter Date: 06/22/2017      PT End of Session - 06/22/17 1435    Visit Number 7   Number of Visits 25   Date for PT Re-Evaluation 08/20/17   Authorization Type 7/10 g code   PT Start Time 0230   PT Stop Time 0300   PT Time Calculation (min) 30 min   Equipment Utilized During Treatment Gait belt   Activity Tolerance Patient tolerated treatment well;Patient limited by fatigue   Behavior During Therapy [facility] for tasks assessed/performed      Past Medical History:  Diagnosis Date  . COPD (chronic obstructive pulmonary disease) ([place])   . Hepatitis C   . Hx of cholecystectomy   . Hypothyroidism   . Kidney cysts   . Major depressive disorder    Was treated at facility in [place] (D and G) years ago  . Tobacco abuse   . Vitamin D deficiency     Past Surgical History:  Procedure Laterality Date  . LAPAROSCOPIC CHOLECYSTECTOMY      There were no vitals filed for this visit.      Subjective Assessment - 06/22/17 1433    Subjective Pt reports no pain today; states that he feels good overall today.   Patient is accompained by: --  Group home administrator    Pertinent History Pt is a 56 yo w/Down syndrome w/CVA on 05/14/17 resulting in weakness of R UE and LE and mild weakness of L UE and LE.  Pt also had dysphagia prior to stroke and is own dyspagia 2 diet and thickened liquids due to risk of aspiration.  Pt has hx of    Limitations Standing;Walking   How long can you sit comfortably? unlimited   How long can you stand comfortably? 30 minutes or less before needing to rest   How long can you walk comfortably? 10 minutes before needing to rest   Patient Stated Goals "better balance"     Currently in Pain? No/denies   Pain Score 0-No pain     Treatment:  NuStep x 5 min warmup   Marching in // bars x 5 laps   [employer] with football with varying BOS (regular, narrow, sharpened) - x 30 passes each in and out of BOS without LOB noted; able to reach across midline with some weight shift to grab football   Step ups to 6 in step x 12 reps total with CGA for safety - decreased toe clearance when stepping back with R LE    Walking with head turns (CGA needed for safety) - verbal and visual cues required to keep turning head from side to side x 2 laps in hall way   Resisted fwd gait with 12.5 lbs, CGA for safety - x 5 reps; cues needed for control with walking                           PT Education - 06/22/17 1434    Education provided Yes   Education Details HEP; Engineering geologist) Educated Patient   Methods Explanation   Comprehension Verbalized understanding;Returned demonstration          PT Short Term Goals - 05/28/17  [place] #1   Title Pt will demonstrate independence w/HEP in order to increase strength and balance to reduce overall risk of falls.   Time 6   Period Weeks   Status New   Target Date 07/09/17     PT SHORT TERM GOAL #2   Title Pt will improve decrease 5 time sit to stand to <17 seconds in order to demonstrate increased LE strength and balance.   Baseline 19 sec   Time 6   Period Weeks   Status New   Target Date 07/09/17     PT SHORT TERM GOAL #3   Title Pt will improve gait speed measured by 10 m WT to 0.8 m/s to demonstrate increased LE strength and functional mobility in order to decrease risk of falls and increase participation in group home activities.   Baseline 0.71 m/s   Time 6   Period Weeks   Status New   Target Date 07/09/17     PT SHORT TERM GOAL #4   Title Pt will improve TUG to < 15 seconds in order to demonstrate improved balance and functional activity tolerance.   Baseline  17 sec   Time 6   Period Weeks   Status New   Target Date 07/09/17     PT SHORT TERM GOAL #5   Title Pt will improve Berg Balance score by at least 4 points in order to demonstrate improved dynamic and static, reducing overall falls risk.   Baseline 36/56   Time 6   Period Weeks   Status New   Target Date 07/09/17           PT Long Term Goals - 05/28/17 1432      PT LONG TERM GOAL #1   Title Pt will improve B LE strength to at least 4/5 in all limited planes in order to increase funtional activity tolerance and return to group home activities.    Baseline gross strength assessment: 3/5   Time 12   Period Weeks   Status New   Target Date 08/20/17     PT LONG TERM GOAL #2   Title Pt will improve 5 time sit to stand to < 15 seconds in order to demonstrate increase in LE strength and balance.   Baseline 19 sec   Time 12   Period Weeks   Status New   Target Date 07/09/17     PT LONG TERM GOAL #3   Title Pt will incrase gait speed measured by 10 m WT to at least 1.0 m/s in order to demonstrate improved balance and LE strength and to decrease overall risk for falls.   Baseline 0.71 m/s   Time 12   Period Weeks   Status New   Target Date 08/20/17     PT LONG TERM GOAL #4   Title Pt will improve TUG to < 13 seconds in order to demonstrate an increase in LE strength and improved dynamic balance.   Baseline 17 seconds   Time 12   Period Weeks   Status New   Target Date 08/20/17     PT LONG TERM GOAL #5   Title Pt will demonstrate an increase in Berg Balance Assessment by at least 10 points in order to demonstrate improved dynamic and static balance, reducing overall risk of falls.   Baseline 36/56   Time 12   Period Weeks   Status New   Target Date  08/20/17               Plan - 06/22/17 1503    Clinical Impression Statement Pt responded well to all interventions implemented during today's session.  Pt demonstrates improved dynamic balance, with ability to  reach across midline in varying BOS stances without LOB and improved postural reactions.  Pt demonstrated improved weight shifting with balance activities today.  Pt required verbal, visual and tactile cues today, as well as CGA for safety.  Pt would benefit from skilled PT services to continue addressing LE weakness, balance deficitis and gait abnormalities in order to increase safety and decrease falls risk.   Rehab Potential Good   Clinical Impairments Affecting Rehab Potential transportation limitations, living situation   PT Frequency 2x / week   PT Duration 12 weeks   PT Treatment/Interventions Cryotherapy;Moist Heat;Gait training;Therapeutic activities;Therapeutic exercise;Balance training;Neuromuscular re-education;Patient/family education;Manual techniques;Passive range of motion;Dry needling   PT Next Visit Plan 6 MWT to assess endurance; begin strength and balance exercises    PT Home Exercise Plan review HEP, basketball pass on airex pad, walking football/basketball pass   Consulted and Agree with Plan of Care Patient;Other (Comment)      Patient will benefit from skilled therapeutic intervention in order to improve the following deficits and impairments:  Abnormal gait, Decreased activity tolerance, Decreased balance, Decreased coordination, Decreased endurance, Decreased mobility, Decreased safety awareness, Decreased strength, Difficulty walking, Decreased cognition  Visit Diagnosis: Muscle weakness (generalized)  Unsteadiness on feet     Problem List Patient Active Problem List   Diagnosis Date Noted  . Major depressive disorder   . Hepatitis C antibody test positive 05/16/2017  . Acute ischemic left MCA stroke ([place])   . Down's syndrome   . PFO with atrial septal aneurysm    This entire session was performed under direct supervision and direction of a licensed therapist/therapist assistant . I have personally read, edited and approve of the note as written. Netta Corrigan, SPT Alanson Puls, PT, DPT  06/22/2017, 5:52 PM  [place] MAIN [facility] SERVICES [address] [place], [place], [address] Phone: [phone]   Fax:  [phone]  Name: Mario Liu MRN: [id number] Date of Birth: Jun 28, 1961

## 2017-06-22 NOTE — Therapy (Signed)
[place] MAIN [facility] SERVICES [address] [place], [place], [address] Phone: [phone]   Fax:  [phone]  Occupational Therapy Treatment  Patient Details  Name: Mario Liu MRN: [id number] Date of Birth: August 07, 1961 Referring Provider: Dr. Erling Cruz  Encounter Date: 06/22/2017      OT End of Session - 06/22/17 1416    Visit Number 6   Number of Visits 24   Date for OT Re-Evaluation 08/28/17   Authorization Type Medicare G code 6 of 10   OT Start Time 1400   OT Stop Time 1430   OT Time Calculation (min) 30 min   Activity Tolerance Patient tolerated treatment well   Behavior During Therapy [facility] for tasks assessed/performed      Past Medical History:  Diagnosis Date  . COPD (chronic obstructive pulmonary disease) ([place])   . Hepatitis C   . Hx of cholecystectomy   . Hypothyroidism   . Kidney cysts   . Major depressive disorder    Was treated at facility in [place] (D and G) years ago  . Tobacco abuse   . Vitamin D deficiency     Past Surgical History:  Procedure Laterality Date  . LAPAROSCOPIC CHOLECYSTECTOMY      There were no vitals filed for this visit.      Subjective Assessment - 06/22/17 1414    Subjective  Pt. was late for the session.   Patient is accompained by: Family member   Pertinent History Pt. is a 56 y.o. 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 Pt. presents with residual UE, and LE weakness. Pt. was hospitalized, and transferred to inpatient Rehab at [facility]. Pt. is on a Dysphagia 2 diet with thickened liquids. Pt. resides in a [place]. Pt. PMHx includes: Down's Syndrome, Major Depressive Disorder, Constipation, and COPD.   Currently in Pain? No/denies      OT TREATMENT    Neuro muscular re-education:  Pt. worked on grasping, flipping and stacking 2" large pegs on the [employer] board placed at a tabletop  surface using his right hand.  Therapeutic Exercise:  Pt. worked on the [employer] for 8 min. with constant monitoring of the BUEs. Pt. worked on changing, and alternating forward reverse position every 2 min. rest breaks were required.                        OT Education - 06/22/17 1415    Education provided Yes   Education Details UE strength, and coordination skills   Person(s) Educated Patient   Methods Explanation   Comprehension Verbalized understanding             OT Long Term Goals - 06/05/17 1247      OT LONG TERM GOAL #1   Title Pt. will improve bilateral UE strength by 2 mm grades to assist with ADLs/IADLs.   Baseline Impaired UE strength   Time 12   Period Weeks   Status New   Target Date 08/28/17     OT LONG TERM GOAL #2   Title Pt. will improve bilateral grip strength to be able to hold aand use a vacuum   Baseline Limited bilateral grip strength   Time 12   Period Weeks   Status New   Target Date 08/28/17     OT LONG TERM GOAL #3   Title Pt. will  improve bilateral hand [facility] skills to assist with manipulating ADL objects.   Baseline Limited bilateral [facility] skills   Time 12   Period Weeks   Status New   Target Date 08/28/17     OT LONG TERM GOAL #4   Title Pt. will improve bilateral pinch strength to be able to independently open containers   Baseline Impaired pinch strength   Time 12   Period Weeks   Status New   Target Date 08/28/17     OT LONG TERM GOAL #5   Title Pt. will improve bilateral eye hand coordination skills to be able to dribble a basketball.   Baseline Impaired   Time 12   Period Weeks   Status New   Target Date 08/28/17               Plan - 06/22/17 1416    Clinical Impression Statement Pt. continues to present with limited UE strength, and coordination skills which limit his ability to perform ADL, and IADL functioning. Pt. requires verbal cues, and assist for [facility] skills, hand function, and technique.  Pt. continues to benefit from working on improving BUE strength, and coordination skills for improved ADL, and IADL functioning.    Occupational performance deficits (Please refer to evaluation for details): ADL's;IADL's   Rehab Potential Good   OT Frequency 2x / week   OT Duration 12 weeks   OT Treatment/Interventions Self-care/ADL training;Therapeutic exercise;Neuromuscular education;DME and/or AE instruction;Therapeutic activities;Therapeutic exercises   Consulted and Agree with Plan of Care Patient      Patient will benefit from skilled therapeutic intervention in order to improve the following deficits and impairments:  Impaired UE functional use, Decreased knowledge of precautions, Decreased strength, Decreased range of motion, Decreased activity tolerance, Impaired perceived functional ability, Decreased coordination, Decreased knowledge of use of DME, Impaired vision/preception, Decreased cognition  Visit Diagnosis: Other lack of coordination  Muscle weakness (generalized)    Problem List Patient Active Problem List   Diagnosis Date Noted  . Major depressive disorder   . Hepatitis C antibody test positive 05/16/2017  . Acute ischemic left MCA stroke ([place])   . Down's syndrome   . PFO with atrial septal aneurysm     Harrel Carina 06/22/2017, 2:33 PM  [place] MAIN [facility] SERVICES [address] [place], [place], [address] Phone: [phone]   Fax:  [phone]  Name: Mario Liu MRN: [id number] Date of Birth: 1961/08/22

## 2017-06-23 ENCOUNTER — Encounter: Payer: Self-pay | Admitting: Speech Pathology

## 2017-06-23 NOTE — Therapy (Signed)
[place] MAIN [facility] SERVICES [address] [place], [place], [address] Phone: [phone]   Fax:  [phone]  Speech Language Pathology Treatment  Patient Details  Name: Mario Liu MRN: [id number] Date of Birth: 1961-05-10 Referring Provider: Bary Liu  Encounter Date: 06/22/2017      End of Session - 06/23/17 1042    Visit Number 5   Number of Visits 17   Date for SLP Re-Evaluation 08/01/17   SLP Start Time 1500   SLP Stop Time  1553   SLP Time Calculation (min) 53 min   Activity Tolerance Patient tolerated treatment well      Past Medical History:  Diagnosis Date  . COPD (chronic obstructive pulmonary disease) ([place])   . Hepatitis C   . Hx of cholecystectomy   . Hypothyroidism   . Kidney cysts   . Major depressive disorder    Was treated at facility in [place] (D and G) years ago  . Tobacco abuse   . Vitamin D deficiency     Past Surgical History:  Procedure Laterality Date  . LAPAROSCOPIC CHOLECYSTECTOMY      There were no vitals filed for this visit.      Subjective Assessment - 06/23/17 1042    Subjective Patient replied "hard" when asked if he thought it was easy or hard for people to understand him; when asked why we were practicing speaking clearly, he responded "so people can understand me"   Currently in Pain? No/denies               ADULT SLP TREATMENT - 06/23/17 0001      General Information   Behavior/Cognition Alert;Cooperative;Pleasant mood;Requires cueing   HPI 56 year old man with PMH significant for depression, Down's syndrome (currently living in a group home), COPD, and tobacco use who presents with aphasia the morning of 05/14/2017. History obtained from group home caregivers. Head CT at [facility] demonstrated hypodensity in L MCA territory, consistent with infarct. At baseline, patient is able to walk on his own, talk, dress himself, do some chores, but unable to complete activities of daily  living, including cooking, cleaning, and needs some help with bathing. Caregivers state that he did recognize who they were and that his speech is still different than it has been. The patient's communication baseline is not clear.     Treatment Provided   Treatment provided Cognitive-Linquistic     Pain Assessment   Pain Assessment No/denies pain     Cognitive-Linquistic Treatment   Treatment focused on Dysarthria;Aphasia   Skilled Treatment SPEECH: Name pictured objects with 40% intelligibility, errors include open syllables and deletion of syllables.  Patient is approximately 70% intelligible in known contexts.  AUDITORY COMPREHENSION:  Complete 2 unit processing tasks with 80% accuracy, waning to 50% as patient fatigued.     Assessment / Recommendations / Plan   Plan Continue with current plan of care     Progression Toward Goals   Progression toward goals Progressing toward goals          SLP Education - 06/23/17 1042    Education provided Yes   Education Details "say all the sounds in a word"   Person(s) Educated Patient   Methods Explanation   Comprehension Verbalized understanding            SLP Long Term Goals - 06/01/17 1358      SLP LONG TERM GOAL #1   Title Pt will improve speech intelligibility for  words/phrases by controlling rate of speech, over-articulation, correct phonetic placement, and increased loudness to achieve 80% intelligibility.   Time 8   Period Weeks   Status New   Target Date 08/01/17     SLP LONG TERM GOAL #2   Title Patient will complete 2 unit processing tasks with 80% accuracy without the need of repetition of task instructions or significant delays in responding.   Time 8   Period Weeks   Status New   Target Date 08/01/17     SLP LONG TERM GOAL #3   Title Patient will generate meaningful and intelligible phrase to complete simple/concrete linguistic task with 80% accuracy.   Time 8   Period Weeks   Status New   Target Date  08/01/17        Patient will benefit from skilled therapeutic intervention in order to improve the following deficits and impairments:   Dysarthria and anarthria  Aphasia    Problem List Patient Active Problem List   Diagnosis Date Noted  . Major depressive disorder   . Hepatitis C antibody test positive 05/16/2017  . Acute ischemic left MCA stroke ([place])   . Down's syndrome   . PFO with atrial septal aneurysm    [name], MS/CCC- SLP  [name] 06/23/2017, 10:43 AM  [place] MAIN [facility] SERVICES [place], [place], [address] Phone: [phone]   Fax:  [phone]   Name: Mario Liu MRN: [id number] Date of Birth: May 24, 1961

## 2017-06-25 ENCOUNTER — Encounter: Payer: Medicare Other | Admitting: Speech Pathology

## 2017-06-26 ENCOUNTER — Ambulatory Visit: Payer: Medicare Other

## 2017-06-26 ENCOUNTER — Ambulatory Visit: Payer: Medicare Other | Admitting: Occupational Therapy

## 2017-06-26 ENCOUNTER — Ambulatory Visit: Payer: Medicare Other | Admitting: Speech Pathology

## 2017-06-29 ENCOUNTER — Ambulatory Visit: Payer: Medicare Other | Admitting: Speech Pathology

## 2017-06-29 ENCOUNTER — Ambulatory Visit: Payer: Medicare Other | Admitting: Occupational Therapy

## 2017-07-01 ENCOUNTER — Ambulatory Visit: Payer: Medicare Other | Admitting: Speech Pathology

## 2017-07-01 ENCOUNTER — Encounter: Payer: Medicare Other | Attending: Physical Medicine & Rehabilitation | Admitting: Physical Medicine & Rehabilitation

## 2017-07-01 ENCOUNTER — Ambulatory Visit: Payer: Medicare Other | Admitting: Occupational Therapy

## 2017-07-01 ENCOUNTER — Ambulatory Visit: Payer: Medicare Other

## 2017-07-06 ENCOUNTER — Ambulatory Visit: Payer: Medicare Other

## 2017-07-06 ENCOUNTER — Ambulatory Visit: Payer: Medicare Other | Admitting: Occupational Therapy

## 2017-07-06 ENCOUNTER — Ambulatory Visit: Payer: Medicare Other | Admitting: Speech Pathology

## 2017-07-06 DIAGNOSIS — R278 Other lack of coordination: Secondary | ICD-10-CM

## 2017-07-06 DIAGNOSIS — M6281 Muscle weakness (generalized): Secondary | ICD-10-CM | POA: Diagnosis not present

## 2017-07-06 DIAGNOSIS — R2681 Unsteadiness on feet: Secondary | ICD-10-CM

## 2017-07-06 NOTE — Therapy (Addendum)
[place] MAIN [facility] SERVICES [address] [place], [place], [address] Phone: [phone]   Fax:  [phone]  Occupational Therapy Treatment  Patient Details  Name: Mario Liu MRN: [id number] Date of Birth: 07-14-1961 Referring Provider: Dr. Erling Cruz  Encounter Date: 07/06/2017      OT End of Session - 07/06/17 1455    Visit Number 7   Number of Visits 24   Date for OT Re-Evaluation 08/28/17   Authorization Type Medicare G code 7 of 10   OT Start Time 7353   OT Stop Time 1515   OT Time Calculation (min) 37 min   Activity Tolerance Patient tolerated treatment well   Behavior During Therapy [facility] for tasks assessed/performed      Past Medical History:  Diagnosis Date  . COPD (chronic obstructive pulmonary disease) ([place])   . Hepatitis C   . Hx of cholecystectomy   . Hypothyroidism   . Kidney cysts   . Major depressive disorder    Was treated at facility in [place] (D and G) years ago  . Tobacco abuse   . Vitamin D deficiency     Past Surgical History:  Procedure Laterality Date  . LAPAROSCOPIC CHOLECYSTECTOMY      There were no vitals filed for this visit.      Subjective Assessment - 07/06/17 1451    Subjective  Pt. was late for the session, an the session was rescheduled.   Pertinent History Pt. is a 56 y.o. 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 Pt. presents with residual UE, and LE weakness. Pt. was hospitalized, and transferred to inpatient Rehab at [facility]. Pt. is on a Dysphagia 2 diet with thickened liquids. Pt. resides in a [place]. Pt. PMHx includes: Down's Syndrome, Major Depressive Disorder, Constipation, and COPD.   Currently in Pain? No/denies      OT TREATMENT    Neuro muscular re-education:  Pt. worked on [facility] skills sorting, and flipping cards. Pt. worked on holding the cards with with his left hand, and  worked on grasping the cards with his right hand.  Therapeutic Exercise:  Pt. worked on the [employer] for 8 min. With constant monitoring of the BUEs. Pt. Worked on changing, and alternating forward reverse position every 2 min. Rest breaks were required. Pt. Worked on pinch strengthening in the left hand for lateral, and 3pt. pinch using yellow, red, green, and blue resistive clips. Pt. worked on placing the clips at various vertical and horizontal angles. Tactile and verbal cues were required for eliciting the desired movement.                             OT Education - 07/06/17 1453    Education provided Yes   Education Details UE functioning.   Person(s) Educated Patient   Methods Explanation   Comprehension Verbalized understanding             OT Long Term Goals - 06/05/17 1247      OT LONG TERM GOAL #1   Title Pt. will improve bilateral UE strength by 2 mm grades to assist with ADLs/IADLs.   Baseline Impaired UE strength   Time 12   Period Weeks   Status New   Target Date 08/28/17     OT LONG TERM GOAL #2   Title Pt. will  improve bilateral grip strength to be able to hold aand use a vacuum   Baseline Limited bilateral grip strength   Time 12   Period Weeks   Status New   Target Date 08/28/17     OT LONG TERM GOAL #3   Title Pt. will improve bilateral hand [facility] skills to assist with manipulating ADL objects.   Baseline Limited bilateral [facility] skills   Time 12   Period Weeks   Status New   Target Date 08/28/17     OT LONG TERM GOAL #4   Title Pt. will improve bilateral pinch strength to be able to independently open containers   Baseline Impaired pinch strength   Time 12   Period Weeks   Status New   Target Date 08/28/17     OT LONG TERM GOAL #5   Title Pt. will improve bilateral eye hand coordination skills to be able to dribble a basketball.   Baseline Impaired   Time 12   Period Weeks   Status New   Target Date 08/28/17                Plan - 07/06/17 1504    Clinical Impression Statement Pt. continues to work on improving UE strength, and coordination skills. Pt. requires frequent consistent verbal cues, and visual demonstration for movement patterns.   Occupational performance deficits (Please refer to evaluation for details): ADL's;IADL's   Rehab Potential Good   OT Frequency 2x / week   OT Duration 12 weeks   OT Treatment/Interventions Self-care/ADL training;Therapeutic exercise;Neuromuscular education;DME and/or AE instruction;Therapeutic activities;Therapeutic exercises   Consulted and Agree with Plan of Care Patient      Patient will benefit from skilled therapeutic intervention in order to improve the following deficits and impairments:  Impaired UE functional use, Decreased knowledge of precautions, Decreased strength, Decreased range of motion, Decreased activity tolerance, Impaired perceived functional ability, Decreased coordination, Decreased knowledge of use of DME, Impaired vision/preception, Decreased cognition  Visit Diagnosis: Muscle weakness (generalized)    Problem List Patient Active Problem List   Diagnosis Date Noted  . Major depressive disorder   . Hepatitis C antibody test positive 05/16/2017  . Acute ischemic left MCA stroke ([place])   . Down's syndrome   . PFO with atrial septal aneurysm     Harrel Carina, MS, OTR/L 07/06/2017, 3:10 PM  [place] MAIN [facility] SERVICES [address] [place], [place], [address] Phone: [phone]   Fax:  [phone]  Name: Mario Liu MRN: [id number] Date of Birth: 05/06/1961

## 2017-07-06 NOTE — Therapy (Signed)
[place] MAIN [facility] SERVICES [address] [place], [place], [address] Phone: [phone]   Fax:  [phone]  Physical Therapy Treatment  Patient Details  Name: Mario Liu MRN: [id number] Date of Birth: [birth year] Referring Provider: Bary Leriche  Encounter Date: 07/06/2017      PT End of Session - 07/06/17 1520    Visit Number 8   Number of Visits 25   Date for PT Re-Evaluation 08/20/17   Authorization Type 8/10 g code   PT Start Time 1515   PT Stop Time 1600   PT Time Calculation (min) 45 min   Equipment Utilized During Treatment Gait belt   Activity Tolerance Patient tolerated treatment well;Patient limited by fatigue   Behavior During Therapy [facility] for tasks assessed/performed      Past Medical History:  Diagnosis Date  . COPD (chronic obstructive pulmonary disease) ([place])   . Hepatitis C   . Hx of cholecystectomy   . Hypothyroidism   . Kidney cysts   . Major depressive disorder    Was treated at facility in [place] (D and G) years ago  . Tobacco abuse   . Vitamin D deficiency     Past Surgical History:  Procedure Laterality Date  . LAPAROSCOPIC CHOLECYSTECTOMY      There were no vitals filed for this visit.      Subjective Assessment - 07/06/17 1518    Subjective Patient reports no stumbles since his last visit. Reports no pain.    Patient is accompained by: --  Group home administrator    Pertinent History Pt is a 56 yo w/Down syndrome w/CVA on 05/14/17 resulting in weakness of R UE and LE and mild weakness of L UE and LE.  Pt also had dysphagia prior to stroke and is own dyspagia 2 diet and thickened liquids due to risk of aspiration.  Pt has hx of    Limitations Standing;Walking   How long can you sit comfortably? unlimited   How long can you stand comfortably? 30 minutes or less before needing to rest   How long can you walk comfortably? 10 minutes before needing to rest   Patient Stated Goals "better  balance"    Currently in Pain? No/denies                  NuStep x 5 min warmup lvl 4               Marching in // bars x 5 laps    Ambulate 1050 ft in hallway CGA. Cues for arm swing and increased step length , no episodes of LOB.           Sit to stand with basketball pass at top of stand x 15 , 2 trials               Resisted fwd/backward gait with 17.5 lbs, CGA for safety - x 5 reps; cues needed for control with walking     Neuro Re-ed         Diona Foley toss with football with varying BOS (regular, narrow, sharpened) - x 30 passes each in and out of BOS without LOB noted; able to reach across midline with some weight shift to grab football  Airex pad: football toss 20x   Kicking soccer ball in // bars to increase single limb stance 30x.  PT Education - 07/06/17 1520    Education provided Yes   Education Details functional strength and balance   Person(s) Educated Patient   Methods Explanation;Demonstration;Verbal cues   Comprehension Verbalized understanding;Returned demonstration          PT Short Term Goals - 05/28/17 1426      PT SHORT TERM GOAL #1   Title Pt will demonstrate independence w/HEP in order to increase strength and balance to reduce overall risk of falls.   Time 6   Period Weeks   Status New   Target Date 07/09/17     PT SHORT TERM GOAL #2   Title Pt will improve decrease 5 time sit to stand to <17 seconds in order to demonstrate increased LE strength and balance.   Baseline 19 sec   Time 6   Period Weeks   Status New   Target Date 07/09/17     PT SHORT TERM GOAL #3   Title Pt will improve gait speed measured by 10 m WT to 0.8 m/s to demonstrate increased LE strength and functional mobility in order to decrease risk of falls and increase participation in group home activities.   Baseline 0.71 m/s   Time 6   Period Weeks   Status New   Target Date 07/09/17     PT SHORT TERM GOAL #4   Title Pt will  improve TUG to < 15 seconds in order to demonstrate improved balance and functional activity tolerance.   Baseline 17 sec   Time 6   Period Weeks   Status New   Target Date 07/09/17     PT SHORT TERM GOAL #5   Title Pt will improve Berg Balance score by at least 4 points in order to demonstrate improved dynamic and static, reducing overall falls risk.   Baseline 36/56   Time 6   Period Weeks   Status New   Target Date 07/09/17           PT Long Term Goals - 05/28/17 1432      PT LONG TERM GOAL #1   Title Pt will improve B LE strength to at least 4/5 in all limited planes in order to increase funtional activity tolerance and return to group home activities.    Baseline gross strength assessment: 3/5   Time 12   Period Weeks   Status New   Target Date 08/20/17     PT LONG TERM GOAL #2   Title Pt will improve 5 time sit to stand to < 15 seconds in order to demonstrate increase in LE strength and balance.   Baseline 19 sec   Time 12   Period Weeks   Status New   Target Date 07/09/17     PT LONG TERM GOAL #3   Title Pt will incrase gait speed measured by 10 m WT to at least 1.0 m/s in order to demonstrate improved balance and LE strength and to decrease overall risk for falls.   Baseline 0.71 m/s   Time 12   Period Weeks   Status New   Target Date 08/20/17     PT LONG TERM GOAL #4   Title Pt will improve TUG to < 13 seconds in order to demonstrate an increase in LE strength and improved dynamic balance.   Baseline 17 seconds   Time 12   Period Weeks   Status New   Target Date 08/20/17     PT LONG TERM GOAL #5  Title Pt will demonstrate an increase in [place] by at least 10 points in order to demonstrate improved dynamic and static balance, reducing overall risk of falls.   Baseline 36/56   Time 12   Period Weeks   Status New   Target Date 08/20/17               Plan - 07/06/17 1551    Clinical Impression Statement Patient progressing  with dynamic and functional strength. Increased ambulatory duration performed with patient requiring frequent verbal cues for arm swing and lifting of LE's. Patient will continue to benefit from skilled physical therapy to continue addressing LE weakness, balance deficits, and gait abnormalities in order to increase safety and decrease fall risk.    Rehab Potential Good   Clinical Impairments Affecting Rehab Potential transportation limitations, living situation   PT Frequency 2x / week   PT Duration 12 weeks   PT Treatment/Interventions Cryotherapy;Moist Heat;Gait training;Therapeutic activities;Therapeutic exercise;Balance training;Neuromuscular re-education;Patient/family education;Manual techniques;Passive range of motion;Dry needling   PT Next Visit Plan 6 MWT to assess endurance; begin strength and balance exercises    PT Home Exercise Plan review HEP, basketball pass on airex pad, walking football/basketball pass   Consulted and Agree with Plan of Care Patient;Other (Comment)      Patient will benefit from skilled therapeutic intervention in order to improve the following deficits and impairments:  Abnormal gait, Decreased activity tolerance, Decreased balance, Decreased coordination, Decreased endurance, Decreased mobility, Decreased safety awareness, Decreased strength, Difficulty walking, Decreased cognition  Visit Diagnosis: Muscle weakness (generalized)  Unsteadiness on feet  Other lack of coordination     Problem List Patient Active Problem List   Diagnosis Date Noted  . Major depressive disorder   . Hepatitis C antibody test positive 05/16/2017  . Acute ischemic left MCA stroke ([place])   . Down's syndrome   . PFO with atrial septal aneurysm    Janna Arch, PT, DPT   Janna Arch 07/06/2017, 4:00 PM  [place] MAIN [facility] SERVICES [address] [place], [place], [address] Phone: [phone]   Fax:  [phone]  Name: Adorian Gwynne MRN: [id number] Date of Birth: Dec 21, 1960

## 2017-07-08 ENCOUNTER — Ambulatory Visit: Payer: Medicare Other

## 2017-07-08 ENCOUNTER — Ambulatory Visit: Payer: Medicare Other | Admitting: Occupational Therapy

## 2017-07-08 ENCOUNTER — Ambulatory Visit: Payer: Medicare Other | Admitting: Speech Pathology

## 2017-07-08 DIAGNOSIS — R278 Other lack of coordination: Secondary | ICD-10-CM

## 2017-07-08 DIAGNOSIS — M6281 Muscle weakness (generalized): Secondary | ICD-10-CM | POA: Diagnosis not present

## 2017-07-08 DIAGNOSIS — R471 Dysarthria and anarthria: Secondary | ICD-10-CM

## 2017-07-08 DIAGNOSIS — R2681 Unsteadiness on feet: Secondary | ICD-10-CM

## 2017-07-08 DIAGNOSIS — R4701 Aphasia: Secondary | ICD-10-CM

## 2017-07-08 NOTE — Therapy (Addendum)
[place] MAIN [facility] SERVICES [address] [place], [place], [address] Phone: [phone]   Fax:  [phone]  Occupational Therapy Treatment  Patient Details  Name: Mario Liu MRN: [id number] Date of Birth: 07/05/1961 Referring Provider: Dr. Erling Cruz  Encounter Date: 07/08/2017      OT End of Session - 07/08/17 0947    Visit Number 8   Number of Visits 24   Date for OT Re-Evaluation 08/28/17   Authorization Type Medicare G code 8 of 10   OT Start Time 0917   OT Stop Time 1000   OT Time Calculation (min) 43 min   Activity Tolerance Patient tolerated treatment well   Behavior During Therapy [facility] for tasks assessed/performed      Past Medical History:  Diagnosis Date  . COPD (chronic obstructive pulmonary disease) ([place])   . Hepatitis C   . Hx of cholecystectomy   . Hypothyroidism   . Kidney cysts   . Major depressive disorder    Was treated at facility in [place] (D and G) years ago  . Tobacco abuse   . Vitamin D deficiency     Past Surgical History:  Procedure Laterality Date  . LAPAROSCOPIC CHOLECYSTECTOMY      There were no vitals filed for this visit.      Subjective Assessment - 07/08/17 0945    Subjective  Pt. reports no pain.   Patient is accompained by: Family member   Pertinent History Pt. is a 56 y.o. 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 Pt. presents with residual UE, and LE weakness. Pt. was hospitalized, and transferred to inpatient Rehab at [facility]. Pt. is on a Dysphagia 2 diet with thickened liquids. Pt. resides in a [place]. Pt. PMHx includes: Down's Syndrome, Major Depressive Disorder, Constipation, and COPD.   Currently in Pain? No/denies      OT TREATMENT    Neuro muscular re-education:  Pt. performed [facility] skills training to improve speed and dexterity needed for ADL tasks and writing. Pt. demonstrated  grasping 1 inch sticks,  inch cylindrical collars, and  inch flat washers on the [employer] pegboard.  Therapeutic Exercise:  Pt. Worked on the [employer] for 8 min. With constant monitoring of the BUEs. Pt. Worked on changing, and alternating forward reverse position every 2 min. Rest breaks were required. Pt. performed gross gripping with grip strengthener. Pt. worked on sustaining grip while grasping pegs and reaching at various heights. Gripper was placed in the 3rd resistive slot with the white resistive spring.                              OT Education - 07/08/17 0945    Education provided Yes   Education Details [place], strengthening   Person(s) Educated Patient   Methods Explanation;Demonstration;Verbal cues   Comprehension Verbalized understanding;Returned demonstration            OT Long Term Goals - 06/05/17 1247      OT LONG TERM GOAL #1   Title Pt. will improve bilateral UE strength by 2 mm grades to assist with ADLs/IADLs.   Baseline Impaired UE strength   Time 12   Period Weeks   Status New   Target Date 08/28/17     OT LONG TERM GOAL #2   Title Pt. will improve bilateral grip strength  to be able to hold aand use a vacuum   Baseline Limited bilateral grip strength   Time 12   Period Weeks   Status New   Target Date 08/28/17     OT LONG TERM GOAL #3   Title Pt. will improve bilateral hand [facility] skills to assist with manipulating ADL objects.   Baseline Limited bilateral [facility] skills   Time 12   Period Weeks   Status New   Target Date 08/28/17     OT LONG TERM GOAL #4   Title Pt. will improve bilateral pinch strength to be able to independently open containers   Baseline Impaired pinch strength   Time 12   Period Weeks   Status New   Target Date 08/28/17     OT LONG TERM GOAL #5   Title Pt. will improve bilateral eye hand coordination skills to be able to dribble a basketball.   Baseline Impaired   Time 12   Period Weeks   Status New    Target Date 08/28/17               Plan - 07/08/17 0947    Clinical Impression Statement Pt. presents with limited RUE strength, and coordination skills, which limited ADL, and IADL functioning. Pt. conitnues to benefit from skilled OT services to improve UE functioning, for improved engagement in ADL, and IADL tasks. Pt. requires verbal cues, and visual cues.      Occupational performance deficits (Please refer to evaluation for details): ADL's;IADL's   Rehab Potential Good   OT Frequency 2x / week   OT Duration 12 weeks   OT Treatment/Interventions Self-care/ADL training;Therapeutic exercise;Neuromuscular education;DME and/or AE instruction;Therapeutic activities;Therapeutic exercises   Consulted and Agree with Plan of Care Patient      Patient will benefit from skilled therapeutic intervention in order to improve the following deficits and impairments:  Impaired UE functional use, Decreased knowledge of precautions, Decreased strength, Decreased range of motion, Decreased activity tolerance, Impaired perceived functional ability, Decreased coordination, Decreased knowledge of use of DME, Impaired vision/preception, Decreased cognition  Visit Diagnosis: Other lack of coordination  Muscle weakness (generalized)    Problem List Patient Active Problem List   Diagnosis Date Noted  . Major depressive disorder   . Hepatitis C antibody test positive 05/16/2017  . Acute ischemic left MCA stroke ([place])   . Down's syndrome   . PFO with atrial septal aneurysm     Harrel Carina, MS, OTR/L 07/08/2017, 9:56 AM  [place] MAIN [facility] SERVICES [address] [place], [place], [address] Phone: [phone]   Fax:  [phone]  Name: Mario Liu MRN: [id number] Date of Birth: 1961/08/05

## 2017-07-08 NOTE — Therapy (Signed)
[name] [facility] SERVICES [address] [place], [place], [address] Phone: [phone]   Fax:  [phone]  Physical Therapy Treatment  Patient Details  Name: Mario Liu MRN: [id number] Date of Birth: [birth year] Referring Provider: Bary Leriche  Encounter Date: 07/08/2017      PT End of Session - 07/08/17 1259    Visit Number 9   Number of Visits 25   Date for PT Re-Evaluation 08/20/17   Authorization Type 9/10 g code   PT Start Time 1115   PT Stop Time 1200   PT Time Calculation (min) 45 min   Equipment Utilized During Treatment Gait belt   Activity Tolerance Patient tolerated treatment well;Patient limited by fatigue   Behavior During Therapy [facility] for tasks assessed/performed      Past Medical History:  Diagnosis Date  . COPD (chronic obstructive pulmonary disease) ([place])   . Hepatitis C   . Hx of cholecystectomy   . Hypothyroidism   . Kidney cysts   . Major depressive disorder    Was treated at facility in [place] (D and G) years ago  . Tobacco abuse   . Vitamin D deficiency     Past Surgical History:  Procedure Laterality Date  . LAPAROSCOPIC CHOLECYSTECTOMY      There were no vitals filed for this visit.      Subjective Assessment - 07/08/17 1118    Subjective Patient a little tired from previous therapies but reports no pain. Watched tv yesterday and a little bit of walking.    Patient is accompained by: --  Group home administrator    Pertinent History Pt is a 56 yo w/Down syndrome w/CVA on 05/14/17 resulting in weakness of R UE and LE and mild weakness of L UE and LE.  Pt also had dysphagia prior to stroke and is own dyspagia 2 diet and thickened liquids due to risk of aspiration.  Pt has hx of    Limitations Standing;Walking   How long can you sit comfortably? unlimited   How long can you stand comfortably? 30 minutes or less before needing to rest   How long can you walk comfortably? 10 minutes before  needing to rest   Patient Stated Goals "better balance"    Currently in Pain? No/denies    Octane fitness lvl 4 3 minutes   Ambulate 1300 ft outside with CGA, cues for picking of bilateral LE's when ambulating over grass and over unstable surfaces with pinecones and branches litering surface. Only one episode of LOB>   Cone speed game: green to yellow: slow exaggerated steps, yellow orange normal gait, orange to red fast speed.   Pick up 6 cones from floor, cues for bending knees and squatting  6" step ups Single to BUE support 20x.   Side stepping in // bars with GTB around ankles   Neuro Re-ed Throw football static stance balance 20 throws airex pad: static balance football throws   walking backwards in // bars 6x no UE support, no LOB, small steps,   Airex balance beam: tandem stance walk SUE support and frequent verbal cues for task orientation 6x, side stepping no UE support 4x.                         PT Education - 07/08/17 1119    Education provided Yes   Education Details strengthening and Customer service manager) Educated Patient   Methods Explanation;Demonstration;Verbal cues  Comprehension Verbalized understanding;Returned demonstration          PT Short Term Goals - 05/28/17 1426      PT SHORT TERM GOAL #1   Title Pt will demonstrate independence w/HEP in order to increase strength and balance to reduce overall risk of falls.   Time 6   Period Weeks   Status New   Target Date 07/09/17     PT SHORT TERM GOAL #2   Title Pt will improve decrease 5 time sit to stand to <17 seconds in order to demonstrate increased LE strength and balance.   Baseline 19 sec   Time 6   Period Weeks   Status New   Target Date 07/09/17     PT SHORT TERM GOAL #3   Title Pt will improve gait speed measured by 10 m WT to 0.8 m/s to demonstrate increased LE strength and functional mobility in order to decrease risk of falls and increase participation in  group home activities.   Baseline 0.71 m/s   Time 6   Period Weeks   Status New   Target Date 07/09/17     PT SHORT TERM GOAL #4   Title Pt will improve TUG to < 15 seconds in order to demonstrate improved balance and functional activity tolerance.   Baseline 17 sec   Time 6   Period Weeks   Status New   Target Date 07/09/17     PT SHORT TERM GOAL #5   Title Pt will improve Berg Balance score by at least 4 points in order to demonstrate improved dynamic and static, reducing overall falls risk.   Baseline 36/56   Time 6   Period Weeks   Status New   Target Date 07/09/17           PT Long Term Goals - 05/28/17 1432      PT LONG TERM GOAL #1   Title Pt will improve B LE strength to at least 4/5 in all limited planes in order to increase funtional activity tolerance and return to group home activities.    Baseline gross strength assessment: 3/5   Time 12   Period Weeks   Status New   Target Date 08/20/17     PT LONG TERM GOAL #2   Title Pt will improve 5 time sit to stand to < 15 seconds in order to demonstrate increase in LE strength and balance.   Baseline 19 sec   Time 12   Period Weeks   Status New   Target Date 07/09/17     PT LONG TERM GOAL #3   Title Pt will incrase gait speed measured by 10 m WT to at least 1.0 m/s in order to demonstrate improved balance and LE strength and to decrease overall risk for falls.   Baseline 0.71 m/s   Time 12   Period Weeks   Status New   Target Date 08/20/17     PT LONG TERM GOAL #4   Title Pt will improve TUG to < 13 seconds in order to demonstrate an increase in LE strength and improved dynamic balance.   Baseline 17 seconds   Time 12   Period Weeks   Status New   Target Date 08/20/17     PT LONG TERM GOAL #5   Title Pt will demonstrate an increase in Berg Balance Assessment by at least 10 points in order to demonstrate improved dynamic and static balance, reducing overall risk of falls.  Baseline 36/56   Time 12    Period Weeks   Status New   Target Date 08/20/17               Plan - 07/08/17 1300    Clinical Impression Statement Patient demonstrated ability to negotiate unstable terrain outside with only one incidence of LOB. Increased awareness of surrounding when ambulating result in increased hip flexion and decreased drag of bilateral feet. Patient will continue to benefit from skilled physical therapy to increase safety and decrease fall risk.    Rehab Potential Good   Clinical Impairments Affecting Rehab Potential transportation limitations, living situation   PT Frequency 2x / week   PT Duration 12 weeks   PT Treatment/Interventions Cryotherapy;Moist Heat;Gait training;Therapeutic activities;Therapeutic exercise;Balance training;Neuromuscular re-education;Patient/family education;Manual techniques;Passive range of motion;Dry needling   PT Next Visit Plan RECERT/DC?   PT Home Exercise Plan review HEP, basketball pass on airex pad, walking football/basketball pass   Consulted and Agree with Plan of Care Patient;Other (Comment)      Patient will benefit from skilled therapeutic intervention in order to improve the following deficits and impairments:  Abnormal gait, Decreased activity tolerance, Decreased balance, Decreased coordination, Decreased endurance, Decreased mobility, Decreased safety awareness, Decreased strength, Difficulty walking, Decreased cognition  Visit Diagnosis: Other lack of coordination  Muscle weakness (generalized)  Unsteadiness on feet     Problem List Patient Active Problem List   Diagnosis Date Noted  . Major depressive disorder   . Hepatitis C antibody test positive 05/16/2017  . Acute ischemic left MCA stroke ([place])   . Down's syndrome   . PFO with atrial septal aneurysm    Janna Arch, PT, DPT   Janna Arch 07/08/2017, 1:01 PM  [place] MAIN [facility] SERVICES [address] [place], [place],  [address] Phone: [phone]   Fax:  [phone]  Name: Mario Liu MRN: [id number] Date of Birth: 25-Jan-1961

## 2017-07-09 ENCOUNTER — Encounter: Payer: Self-pay | Admitting: Speech Pathology

## 2017-07-09 ENCOUNTER — Institutional Professional Consult (permissible substitution): Payer: Medicare Other | Admitting: Cardiovascular Disease

## 2017-07-09 NOTE — Therapy (Signed)
[place] MAIN [facility] SERVICES [address] [place], [place], [address] Phone: [phone]   Fax:  [phone]  Speech Language Pathology Treatment  Patient Details  Name: Mario Liu MRN: [id number] Date of Birth: Feb 19, 1961 Referring Provider: Bary Leriche  Encounter Date: 07/08/2017      End of Session - 07/09/17 1131    Visit Number 6   Number of Visits 17   Date for SLP Re-Evaluation 08/01/17   SLP Start Time 54   SLP Stop Time  1055   SLP Time Calculation (min) 50 min   Activity Tolerance Patient tolerated treatment well      Past Medical History:  Diagnosis Date  . COPD (chronic obstructive pulmonary disease) ([place])   . Hepatitis C   . Hx of cholecystectomy   . Hypothyroidism   . Kidney cysts   . Major depressive disorder    Was treated at facility in [place] (D and G) years ago  . Tobacco abuse   . Vitamin D deficiency     Past Surgical History:  Procedure Laterality Date  . LAPAROSCOPIC CHOLECYSTECTOMY      There were no vitals filed for this visit.      Subjective Assessment - 07/09/17 1130    Subjective Patient states that he feels good   Currently in Pain? No/denies               ADULT SLP TREATMENT - 07/09/17 0001      General Information   Behavior/Cognition Alert;Cooperative;Pleasant mood;Requires cueing   HPI 56 year old man with PMH significant for depression, Down's syndrome (currently living in a group home), COPD, and tobacco use who presents with aphasia the morning of 05/14/2017. History obtained from group home caregivers. Head CT at [facility] demonstrated hypodensity in L MCA territory, consistent with infarct. At baseline, patient is able to walk on his own, talk, dress himself, do some chores, but unable to complete activities of daily living, including cooking, cleaning, and needs some help with bathing. Caregivers state that he did recognize who they were and that his speech is still  different than it has been. The patient's communication baseline is not clear.     Treatment Provided   Treatment provided Cognitive-Linquistic     Pain Assessment   Pain Assessment No/denies pain     Cognitive-Linquistic Treatment   Treatment focused on Dysarthria;Aphasia   Skilled Treatment SPEECH: Name pictured objects with 50% intelligibility, errors include open syllables and deletion of syllables.  Patient is approximately 70% intelligible in known contexts.  AUDITORY COMPREHENSION:  Complete 2 unit processing tasks with 80% accuracy, waning to 50% as patient fatigued.     Assessment / Recommendations / Plan   Plan Continue with current plan of care     Progression Toward Goals   Progression toward goals Progressing toward goals          SLP Education - 07/09/17 1131    Education provided Yes   Education Details say all the sounds in a word   Person(s) Educated Patient   Methods Explanation   Comprehension Verbalized understanding            SLP Long Term Goals - 06/01/17 1358      SLP LONG TERM GOAL #1   Title Pt will improve speech intelligibility for words/phrases by controlling rate of speech, over-articulation, correct phonetic placement, and increased loudness to achieve 80% intelligibility.   Time 8   Period Weeks  Status New   Target Date 08/01/17     SLP LONG TERM GOAL #2   Title Patient will complete 2 unit processing tasks with 80% accuracy without the need of repetition of task instructions or significant delays in responding.   Time 8   Period Weeks   Status New   Target Date 08/01/17     SLP LONG TERM GOAL #3   Title Patient will generate meaningful and intelligible phrase to complete simple/concrete linguistic task with 80% accuracy.   Time 8   Period Weeks   Status New   Target Date 08/01/17          Plan - 07/09/17 1131    Clinical Impression Statement The patient is presenting with dysarthria and aphasia characterized by  unintelligible speech (due to imprecise articulation as well as phonological deviations), telegraphic/simplified speech, and inconsistent auditory comprehension for simple commands/questions. Patient demonstrates modest improvement in speech intelligibility.  The patient will continue to benefit from skilled speech therapy to maximize functional communication.   Speech Therapy Frequency 2x / week   Duration Other (comment)   Treatment/Interventions Patient/family education;SLP instruction and feedback;Functional tasks   Potential to Achieve Goals Fair   Potential Considerations Ability to learn/carryover information;Co-morbidities;Previous level of function;Family/community support;Medical prognosis;Severity of impairments;Cooperation/participation level   SLP Home Exercise Plan practice speaking slowly and clearly    Consulted and Agree with Plan of Care Patient      Patient will benefit from skilled therapeutic intervention in order to improve the following deficits and impairments:   Dysarthria and anarthria  Aphasia    Problem List Patient Active Problem List   Diagnosis Date Noted  . Major depressive disorder   . Hepatitis C antibody test positive 05/16/2017  . Acute ischemic left MCA stroke ([place])   . Down's syndrome   . PFO with atrial septal aneurysm    [name], MS/CCC- SLP  [name] 07/09/2017, 11:34 AM  [place] MAIN [facility] SERVICES [place], [place], [address] Phone: [phone]   Fax:  [phone]   Name: Mario Liu MRN: [id number] Date of Birth: 54-30-1962

## 2017-07-13 ENCOUNTER — Ambulatory Visit: Payer: Medicare Other

## 2017-07-13 ENCOUNTER — Ambulatory Visit: Payer: Medicare Other | Admitting: Occupational Therapy

## 2017-07-13 ENCOUNTER — Ambulatory Visit: Payer: Medicare Other | Admitting: Speech Pathology

## 2017-07-13 DIAGNOSIS — R278 Other lack of coordination: Secondary | ICD-10-CM

## 2017-07-13 DIAGNOSIS — R471 Dysarthria and anarthria: Secondary | ICD-10-CM

## 2017-07-13 DIAGNOSIS — M6281 Muscle weakness (generalized): Secondary | ICD-10-CM

## 2017-07-13 DIAGNOSIS — R4701 Aphasia: Secondary | ICD-10-CM

## 2017-07-13 DIAGNOSIS — R2681 Unsteadiness on feet: Secondary | ICD-10-CM

## 2017-07-13 NOTE — Therapy (Signed)
[place] MAIN [facility] SERVICES [address] [place], [place], [address] Phone: [phone]   Fax:  [phone]  Physical Therapy Treatment  Patient Details  Name: Mario Liu MRN: [id number] Date of Birth: Sep 13, 1961 Referring Provider: Bary Leriche  Encounter Date: 07/13/2017      PT End of Session - 07/13/17 1203    Visit Number 10   Number of Visits 25   Date for PT Re-Evaluation 08/20/17   Authorization Type 10/10 g code (next 1/10)   PT Start Time 1115   PT Stop Time 1200   PT Time Calculation (min) 45 min   Equipment Utilized During Treatment Gait belt   Activity Tolerance Patient tolerated treatment well;Patient limited by fatigue   Behavior During Therapy WFL for tasks assessed/performed      Past Medical History:  Diagnosis Date  . COPD (chronic obstructive pulmonary disease) ([place])   . Hepatitis C   . Hx of cholecystectomy   . Hypothyroidism   . Kidney cysts   . Major depressive disorder    Was treated at facility in [place] (D and G) years ago  . Tobacco abuse   . Vitamin D deficiency     Past Surgical History:  Procedure Laterality Date  . LAPAROSCOPIC CHOLECYSTECTOMY      There were no vitals filed for this visit.      Subjective Assessment - 07/13/17 1117    Subjective Patient reports compliance with HEP. No concerns/ questions.    Patient is accompained by: --  Group home administrator    Pertinent History Pt is a 56 yo w/Mario Liu w/CVA on 05/14/17 resulting in weakness of R UE and LE and mild weakness of L UE and LE.  Pt also had dysphagia prior to stroke and is own dyspagia 2 diet and thickened liquids due to risk of aspiration.  Pt has hx of    Limitations Standing;Walking   How long can you sit comfortably? unlimited   How long can you stand comfortably? 30 minutes or less before needing to rest   How long can you walk comfortably? 10 minutes before needing to rest   Patient Stated Goals  "better balance"    Currently in Pain? No/denies      Goals: 5x STS: 15 seconds  10MWT 13 seconds =.4ms TUG 20 seconds BERG= 46/56    Ambulate 1300 ft outside with CGA, cues for picking of bilateral LE's when ambulating over grass and over unstable surfaces with pinecones and branches litering surface. No LOB  Step/curb education and performance 15x, cues for forward step progression rather than side stepping up curbs. Frequent cueing for hands out of pockets.    Pt. response to medical necessity: Patient will continue to benefit from skilled physical therapy to increase safety and decrease fall risk.         [facility] Center[facility] Assessment - 07/13/17 0001      Standardized Balance Assessment   Standardized Balance Assessment Berg Balance Test     Berg Balance Test   Sit to Stand Able to stand without using hands and stabilize independently   Standing Unsupported Able to stand safely 2 minutes   Sitting with Back Unsupported but Feet Supported on Floor or Stool Able to sit safely and securely 2 minutes   Stand to Sit Sits safely with minimal use of hands   Transfers Able to transfer safely, definite need of hands   Standing Unsupported with Eyes Closed Able to stand 10  seconds with supervision   Standing Ubsupported with Feet Together Able to place feet together independently and stand for 1 minute with supervision   From Standing, Reach Forward with Outstretched Arm Can reach forward >12 cm safely (5")   From Standing Position, Pick up Object from [place] to pick up shoe safely and easily   From Standing Position, Turn to Look Behind Over each Shoulder Turn sideways only but maintains balance   Turn 360 Degrees Able to turn 360 degrees safely in 4 seconds or less   Standing Unsupported, Alternately Place Feet on Step/Stool Able to stand independently and complete 8 steps >20 seconds   Standing Unsupported, One Foot in Front Able to take small step independently and hold 30 seconds    Standing on One Leg Able to lift leg independently and hold 5-10 seconds   Total Score 46                        PT Education - 07/13/17 1203    Education provided Yes   Education Details goal progression    Person(s) Educated Patient   Methods Explanation;Demonstration   Comprehension Verbalized understanding;Returned demonstration          PT Short Term Goals - 07/13/17 1134      PT SHORT TERM GOAL #1   Title Pt will demonstrate independence w/HEP in order to increase strength and balance to reduce overall risk of falls.   Baseline continuation of progression   Time 6   Period Weeks   Status Partially Met     PT SHORT TERM GOAL #2   Title Pt will improve decrease 5 time sit to stand to <17 seconds in order to demonstrate increased LE strength and balance.   Baseline 19 sec, 20/29: 15 seconds   Time 6   Period Weeks   Status Achieved     PT SHORT TERM GOAL #3   Title Pt will improve gait speed measured by 10 m WT to 0.8 m/s to demonstrate increased LE strength and functional mobility in order to decrease risk of falls and increase participation in group home activities.   Baseline 0.71 m/s, 10/29: .78 m/s   Time 6   Period Weeks   Status Partially Met     PT SHORT TERM GOAL #4   Title Pt will improve TUG to < 15 seconds in order to demonstrate improved balance and functional activity tolerance.   Baseline 17 sec 10/29: 20 seconds   Time 6   Period Weeks   Status On-going     PT SHORT TERM GOAL #5   Title Pt will improve Berg Balance score by at least 4 points in order to demonstrate improved dynamic and static, reducing overall falls risk.   Baseline 36/56 10/29: 46/56   Time 6   Period Weeks   Status Achieved           PT Long Term Goals - 07/13/17 1135      PT LONG TERM GOAL #1   Title Pt will improve B LE strength to at least 4/5 in all limited planes in order to increase funtional activity tolerance and return to group home activities.     Baseline gross strength assessment: 3/5; 10/29: 4/5   Time 12   Period Weeks   Status Achieved     PT LONG TERM GOAL #2   Title Pt will improve 5 time sit to stand to < 15 seconds in  order to demonstrate increase in LE strength and balance.   Baseline 19 sec 07-16-23: 15 seconds   Time 12   Period Weeks   Status Partially Met     PT LONG TERM GOAL #3   Title Pt will incrase gait speed measured by 10 m WT to at least 1.0 m/s in order to demonstrate improved balance and LE strength and to decrease overall risk for falls.   Baseline 0.71 m/s 07-16-2023: . 78 m/s   Time 12   Period Weeks   Status Achieved     PT LONG TERM GOAL #4   Title Pt will improve TUG to < 13 seconds in order to demonstrate an increase in LE strength and improved dynamic balance.   Baseline 17 seconds 2023/07/16: 20 seconds   Time 12   Period Weeks   Status On-going     PT LONG TERM GOAL #5   Title Pt will demonstrate an increase in Berg Balance Assessment by at least 10 points in order to demonstrate improved dynamic and static balance, reducing overall risk of falls.   Baseline 36/56 07-16-23: 46/56   Time 12   Period Weeks   Status Achieved               Plan - 07-15-2017 10/17/1204    Clinical Impression Statement Patient progressing towards goals at this time. Changing speed of ambulation continues to challenge patient as patient preference is slower ambulatory speed 10MWT=.11ms, TUG=20 seconds, Sit to stands improving in speed, 5xSTS= 15 seconds,  however fatigues patient towards end. Balance progressing to 46/56. Patient will continue to benefit from skilled physical therapy to increase safety and decrease fall risk.    Rehab Potential Good   Clinical Impairments Affecting Rehab Potential transportation limitations, living situation   PT Frequency 2x / week   PT Duration 12 weeks   PT Treatment/Interventions Cryotherapy;Moist Heat;Gait training;Therapeutic activities;Therapeutic exercise;Balance  training;Neuromuscular re-education;Patient/family education;Manual techniques;Passive range of motion;Dry needling   PT Next Visit Plan ambulation speed changes    PT Home Exercise Plan review HEP, basketball pass on airex pad, walking football/basketball pass   Consulted and Agree with Plan of Care Patient;Other (Comment)      Patient will benefit from skilled therapeutic intervention in order to improve the following deficits and impairments:  Abnormal gait, Decreased activity tolerance, Decreased balance, Decreased coordination, Decreased endurance, Decreased mobility, Decreased safety awareness, Decreased strength, Difficulty walking, Decreased cognition  Visit Diagnosis: Muscle weakness (generalized)  Other lack of coordination  Unsteadiness on feet       G-Codes - 1Oct 31, 2018102-Feb-1207   Functional Assessment Tool Used (Outpatient Only) Berg Balance Scale, 5x sit to stand, TUG, 160mT, clinical judgement   Functional Limitation Mobility: Walking and moving around   Mobility: Walking and Moving Around Current Status ([phone] least 40 percent but less than 60 percent impaired, limited or restricted   Mobility: Walking and Moving Around Goal Status ([phone] least 1 percent but less than 20 percent impaired, limited or restricted      Problem List Patient Active Problem List   Diagnosis Date Noted  . Major depressive disorder   . Hepatitis C antibody test positive 05/16/2017  . Acute ischemic left MCA stroke ([place]  . Mario's Liu   . PFO with atrial septal aneurysm    MaJanna ArchPT, DPT   MaJanna Arch0Oct 31, 201812:08 PM  [facility] [place] Phone: [phone]   Fax:  [phone]  Name: Nicki Gracy MRN: [id number] Date of Birth: 02-26-1961

## 2017-07-13 NOTE — Therapy (Addendum)
[place] MAIN [facility] SERVICES [address] [place], [place], [address] Phone: [phone]   Fax:  [phone]  Occupational Therapy Treatment  Patient Details  Name: Mario Liu MRN: [id number] Date of Birth: 1961/06/06 Referring Provider: Dr. Erling Cruz  Encounter Date: 07/13/2017      OT End of Session - 07/13/17 0943    Visit Number 9   Number of Visits 24   Date for OT Re-Evaluation 08/28/17   Authorization Type Medicare G code 9 of 10   OT Start Time 0915   OT Stop Time 1000   OT Time Calculation (min) 45 min   Activity Tolerance Patient tolerated treatment well   Behavior During Therapy [facility] for tasks assessed/performed      Past Medical History:  Diagnosis Date  . COPD (chronic obstructive pulmonary disease) ([place])   . Hepatitis C   . Hx of cholecystectomy   . Hypothyroidism   . Kidney cysts   . Major depressive disorder    Was treated at facility in [place] (D and G) years ago  . Tobacco abuse   . Vitamin D deficiency     Past Surgical History:  Procedure Laterality Date  . LAPAROSCOPIC CHOLECYSTECTOMY      There were no vitals filed for this visit.      Subjective Assessment - 07/13/17 0941    Subjective  Pt. reports no pain.   Patient is accompained by: Family member   Pertinent History Pt. is a 56 y.o. 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 Pt. presents with residual UE, and LE weakness. Pt. was hospitalized, and transferred to inpatient Rehab at [facility]. Pt. is on a Dysphagia 2 diet with thickened liquids. Pt. resides in a [place]. Pt. PMHx includes: Down's Syndrome, Major Depressive Disorder, Constipation, and COPD.   Currently in Pain? No/denies     OT TREATMENT    Neuro muscular re-education:  Pt. worked on grasping 1" flat washers. Pt. worked on placing them on vertical dowels. Pt. worked with his right, and  left hand.    Therapeutic Exercise:  Pt. Worked on the [employer] for 8 min. with constant monitoring of the BUEs. Pt. worked on changing, and alternating forward reverse position every 2 min. rest breaks were required. Pt. performed gross gripping with grip strengthener. Pt. worked on sustaining grip while grasping pegs and reaching at various heights. Gripper was placed in the 3rd resistive slot with the white resistive spring.                                OT Education - 07/13/17 [phone]    Education provided Yes   Education Details [place], UE strength   Person(s) Educated Patient   Methods Explanation   Comprehension Verbalized understanding            OT Long Term Goals - 06/05/17 1247      OT LONG TERM GOAL #1   Title Pt. will improve bilateral UE strength by 2 mm grades to assist with ADLs/IADLs.   Baseline Impaired UE strength   Time 12   Period Weeks   Status New   Target Date 08/28/17     OT LONG TERM GOAL #2   Title Pt. will improve bilateral grip strength to be able to hold aand use a vacuum  Baseline Limited bilateral grip strength   Time 12   Period Weeks   Status New   Target Date 08/28/17     OT LONG TERM GOAL #3   Title Pt. will improve bilateral hand [facility] skills to assist with manipulating ADL objects.   Baseline Limited bilateral [facility] skills   Time 12   Period Weeks   Status New   Target Date 08/28/17     OT LONG TERM GOAL #4   Title Pt. will improve bilateral pinch strength to be able to independently open containers   Baseline Impaired pinch strength   Time 12   Period Weeks   Status New   Target Date 08/28/17     OT LONG TERM GOAL #5   Title Pt. will improve bilateral eye hand coordination skills to be able to dribble a basketball.   Baseline Impaired   Time 12   Period Weeks   Status New   Target Date 08/28/17               Plan - 07/13/17 0943    Clinical Impression Statement Pt. continues to present with  UE weakness, and incoordination skills.  Pt. continues to work on improving UE strength, and coordination skills for improved engagement in ADL, and IADL tasks.    Occupational performance deficits (Please refer to evaluation for details): ADL's;IADL's   Rehab Potential Good   OT Frequency 2x / week   OT Duration 12 weeks   OT Treatment/Interventions Self-care/ADL training;Therapeutic exercise;Neuromuscular education;DME and/or AE instruction;Therapeutic activities;Therapeutic exercises   Consulted and Agree with Plan of Care Patient      Patient will benefit from skilled therapeutic intervention in order to improve the following deficits and impairments:  Impaired UE functional use, Decreased knowledge of precautions, Decreased strength, Decreased range of motion, Decreased activity tolerance, Impaired perceived functional ability, Decreased coordination, Decreased knowledge of use of DME, Impaired vision/preception, Decreased cognition  Visit Diagnosis: Muscle weakness (generalized)  Other lack of coordination    Problem List Patient Active Problem List   Diagnosis Date Noted  . Major depressive disorder   . Hepatitis C antibody test positive 05/16/2017  . Acute ischemic left MCA stroke ([place])   . Down's syndrome   . PFO with atrial septal aneurysm     Harrel Carina, MS, OTR/L 07/13/2017, 11:01 AM  [place] MAIN [facility] SERVICES [address] [place], [place], [address] Phone: [phone]   Fax:  [phone]  Name: Mario Liu MRN: [id number] Date of Birth: Mar 05, 1961

## 2017-07-14 ENCOUNTER — Encounter: Payer: Self-pay | Admitting: Speech Pathology

## 2017-07-14 NOTE — Therapy (Signed)
[place] MAIN [facility] SERVICES [address] [place], [place], [address] Phone: [phone]   Fax:  [phone]  Speech Language Pathology Treatment  Patient Details  Name: Mario Liu MRN: [id number] Date of Birth: November 21, 1960 Referring Provider: Bary Leriche  Encounter Date: 07/13/2017      End of Session - 07/14/17 0831    Visit Number 7   Number of Visits 17   Date for SLP Re-Evaluation 08/01/17   SLP Start Time 1000   SLP Stop Time  1051   SLP Time Calculation (min) 51 min   Activity Tolerance Patient tolerated treatment well      Past Medical History:  Diagnosis Date  . COPD (chronic obstructive pulmonary disease) ([place])   . Hepatitis C   . Hx of cholecystectomy   . Hypothyroidism   . Kidney cysts   . Major depressive disorder    Was treated at facility in [place] (D and G) years ago  . Tobacco abuse   . Vitamin D deficiency     Past Surgical History:  Procedure Laterality Date  . LAPAROSCOPIC CHOLECYSTECTOMY      There were no vitals filed for this visit.      Subjective Assessment - 07/14/17 0830    Subjective Patient states that he feels good   Currently in Pain? No/denies               ADULT SLP TREATMENT - 07/14/17 0001      General Information   Behavior/Cognition Alert;Cooperative;Pleasant mood;Requires cueing   HPI 56 year old man with PMH significant for depression, Down's syndrome (currently living in a group home), COPD, and tobacco use who presents with aphasia the morning of 05/14/2017. History obtained from group home caregivers. Head CT at [facility] demonstrated hypodensity in L MCA territory, consistent with infarct. At baseline, patient is able to walk on his own, talk, dress himself, do some chores, but unable to complete activities of daily living, including cooking, cleaning, and needs some help with bathing. Caregivers state that he did recognize who they were and that his speech is still  different than it has been. The patient's communication baseline is not clear.     Treatment Provided   Treatment provided Cognitive-Linquistic     Pain Assessment   Pain Assessment No/denies pain     Cognitive-Linquistic Treatment   Treatment focused on Dysarthria;Aphasia   Skilled Treatment SPEECH: Name pictured objects with 50% intelligibility, errors include open syllables and deletion of syllables.  Patient is approximately 70% intelligible in known contexts and 50% in less structured contexts/conversation.  AUDITORY COMPREHENSION:  Complete 2 unit processing tasks with 80% accuracy, waning to 50% as patient fatigued/lost interest.     Assessment / Recommendations / Plan   Plan Continue with current plan of care     Progression Toward Goals   Progression toward goals Progressing toward goals          SLP Education - 07/14/17 0830    Education provided Yes   Education Details speak clearly, give cues to aid intelligibility   Person(s) Educated Patient   Methods Explanation   Comprehension Verbalized understanding            SLP Long Term Goals - 06/01/17 1358      SLP LONG TERM GOAL #1   Title Pt will improve speech intelligibility for words/phrases by controlling rate of speech, over-articulation, correct phonetic placement, and increased loudness to achieve 80% intelligibility.   Time  8   Period Weeks   Status New   Target Date 08/01/17     SLP LONG TERM GOAL #2   Title Patient will complete 2 unit processing tasks with 80% accuracy without the need of repetition of task instructions or significant delays in responding.   Time 8   Period Weeks   Status New   Target Date 08/01/17     SLP LONG TERM GOAL #3   Title Patient will generate meaningful and intelligible phrase to complete simple/concrete linguistic task with 80% accuracy.   Time 8   Period Weeks   Status New   Target Date 08/01/17          Plan - 07/14/17 0831    Clinical Impression  Statement The patient is presenting with dysarthria and aphasia characterized by unintelligible speech (due to imprecise articulation as well as phonological deviations), telegraphic/simplified speech, and inconsistent auditory comprehension for simple commands/questions. Patient demonstrates modest improvement in speech intelligibility.  The patient will continue to benefit from skilled speech therapy to maximize functional communication.   Speech Therapy Frequency 2x / week   Duration Other (comment)   Treatment/Interventions Patient/family education;SLP instruction and feedback;Functional tasks   Potential to Achieve Goals Fair   Potential Considerations Ability to learn/carryover information;Co-morbidities;Previous level of function;Family/community support;Medical prognosis;Severity of impairments;Cooperation/participation level   SLP Home Exercise Plan practice speaking slowly and clearly    Consulted and Agree with Plan of Care Patient      Patient will benefit from skilled therapeutic intervention in order to improve the following deficits and impairments:   Dysarthria and anarthria  Aphasia    Problem List Patient Active Problem List   Diagnosis Date Noted  . Major depressive disorder   . Hepatitis C antibody test positive 05/16/2017  . Acute ischemic left MCA stroke ([place])   . Down's syndrome   . PFO with atrial septal aneurysm    [name], MS/CCC- SLP  [name] 07/14/2017, 8:32 AM  [place] MAIN [facility] SERVICES [address] [place], [place], [address] Phone: [phone]   Fax:  [phone]   Name: [name] MRN: [id number] Date of Birth: November 12, 1960

## 2017-07-15 ENCOUNTER — Ambulatory Visit: Payer: Medicare Other | Admitting: Speech Pathology

## 2017-07-15 ENCOUNTER — Ambulatory Visit: Payer: Medicare Other | Admitting: Occupational Therapy

## 2017-07-15 ENCOUNTER — Ambulatory Visit: Payer: Medicare Other

## 2017-07-15 ENCOUNTER — Encounter: Payer: Self-pay | Admitting: Occupational Therapy

## 2017-07-15 ENCOUNTER — Encounter: Payer: Self-pay | Admitting: Speech Pathology

## 2017-07-15 DIAGNOSIS — R471 Dysarthria and anarthria: Secondary | ICD-10-CM

## 2017-07-15 DIAGNOSIS — M6281 Muscle weakness (generalized): Secondary | ICD-10-CM

## 2017-07-15 DIAGNOSIS — R278 Other lack of coordination: Secondary | ICD-10-CM

## 2017-07-15 DIAGNOSIS — R2681 Unsteadiness on feet: Secondary | ICD-10-CM

## 2017-07-15 DIAGNOSIS — R4701 Aphasia: Secondary | ICD-10-CM

## 2017-07-15 NOTE — Therapy (Signed)
[facility] MAIN REHAB SERVICES [address] [place], [name], [address] Phone: [phone]   Fax:  [phone]  Physical Therapy Treatment  Patient Details  Name: Mario Liu MRN: [id number] Date of Birth: 11/22/1960 Referring Provider: LOVE, PAMELA Liu  Encounter Date: 07/15/2017      PT End of Session - 07/15/17 1100    Visit Number 11   Number of Visits 25   Date for PT Re-Evaluation 08/20/17   Authorization Type 1/10   PT Start Time 1102   PT Stop Time 1148   PT Time Calculation (min) 46 min   Equipment Utilized During Treatment Gait belt   Activity Tolerance Patient tolerated treatment well;Patient limited by fatigue   Behavior During Therapy WFL for tasks assessed/performed      Past Medical History:  Diagnosis Date  . COPD (chronic obstructive pulmonary disease) ([facility])   . Hepatitis C   . Hx of cholecystectomy   . Hypothyroidism   . Kidney cysts   . Major depressive disorder    Was treated at facility in [place] (D and G) years ago  . Tobacco abuse   . Vitamin D deficiency     Past Surgical History:  Procedure Laterality Date  . LAPAROSCOPIC CHOLECYSTECTOMY      There were no vitals filed for this visit.      Subjective Assessment - 07/15/17 1107    Subjective Patient going to a halloween party tonight, no stumbles since last visit.    Patient is accompained by: --  Group home administrator    Pertinent History Pt is a 56 yo w/Down syndrome w/CVA on 05/14/17 resulting in weakness of R UE and LE and mild weakness of L UE and LE.  Pt also had dysphagia prior to stroke and is own dyspagia 2 diet and thickened liquids due to risk of aspiration.  Pt has hx of    Limitations Standing;Walking   How long can you sit comfortably? unlimited   How long can you stand comfortably? 30 minutes or less before needing to rest   How long can you walk comfortably? 10 minutes before needing to rest   Patient Stated Goals "better  balance"    Currently in Pain? No/denies      Octane fitness lvl 4 3 minutes   Ambulate 1300 ft outside with CGA, cues for picking of bilateral LE'Liu when ambulating over grass and over unstable surfaces with pinecones and branches litering surface. Only one episode of LOB>    [facility]one speed game: green to yellow: slow exaggerated steps, yellow orange normal gait, orange to red fast speed.    Sit<>Stand from plinth holding and tossing ball to PT 2 x15 reps with cues to increase forward lean for better transfer ability;    6" step ups Single UE support 20x total, visual and verbal cueing for utilization of RLE due to fatigue.   6" step toe taps no UE support 20x.   Side step onto 6" step 12x each foot, require max visual and verbal cueing for task performance.    5lb ankle weights seated  Marches 20  Knee extension 10x each leg  Step outs abduction  10x    Neuro Re-ed  airex pad: static balance football throws 30x  walking backwards in // bars 6x no UE support, no LOB, small steps,    Airex balance beam: tandem stance walk SUE support-no UE support and frequent verbal cues for task orientation 6x, side stepping no UE   support 4x.    Ladder drills: Forward reciprocal x4 laps, supervision;  Side stepping x1 laps each direction with mod VCs and demonstration for sequencing and positioning; Requires min-mod A for balance, patient hesitant to stay sideways and exhibits wide base of support;                           PT Education - 07/15/17 1052    Education provided Yes   Education Details LE strenghtening and stability    Person(Liu) Educated Patient   Methods Explanation;Demonstration   Comprehension Verbalized understanding;Returned demonstration          PT Short Term Goals - 07/13/17 1134      PT SHORT TERM GOAL #1   Title Pt will demonstrate independence w/HEP in order to increase strength and balance to reduce overall risk of falls.   Baseline  continuation of progression   Time 6   Period Weeks   Status Partially Met     PT SHORT TERM GOAL #2   Title Pt will improve decrease 5 time sit to stand to <17 seconds in order to demonstrate increased LE strength and balance.   Baseline 19 sec, 20/29: 15 seconds   Time 6   Period Weeks   Status Achieved     PT SHORT TERM GOAL #3   Title Pt will improve gait speed measured by 10 m WT to 0.8 m/Liu to demonstrate increased LE strength and functional mobility in order to decrease risk of falls and increase participation in group home activities.   Baseline 0.71 m/Liu, 10/29: .78 m/Liu   Time 6   Period Weeks   Status Partially Met     PT SHORT TERM GOAL #4   Title Pt will improve TUG to < 15 seconds in order to demonstrate improved balance and functional activity tolerance.   Baseline 17 sec 10/29: 20 seconds   Time 6   Period Weeks   Status On-going     PT SHORT TERM GOAL #5   Title Pt will improve Berg Balance score by at least 4 points in order to demonstrate improved dynamic and static, reducing overall falls risk.   Baseline 36/56 10/29: 46/56   Time 6   Period Weeks   Status Achieved           PT Long Term Goals - 07/13/17 1135      PT LONG TERM GOAL #1   Title Pt will improve B LE strength to at least 4/5 in all limited planes in order to increase funtional activity tolerance and return to group home activities.    Baseline gross strength assessment: 3/5; 10/29: 4/5   Time 12   Period Weeks   Status Achieved     PT LONG TERM GOAL #2   Title Pt will improve 5 time sit to stand to < 15 seconds in order to demonstrate increase in LE strength and balance.   Baseline 19 sec 10/29: 15 seconds   Time 12   Period Weeks   Status Partially Met     PT LONG TERM GOAL #3   Title Pt will incrase gait speed measured by 10 m WT to at least 1.0 m/Liu in order to demonstrate improved balance and LE strength and to decrease overall risk for falls.   Baseline 0.71 m/Liu 10/29: . 78 m/Liu    Time 12   Period Weeks   Status Achieved     PT   LONG TERM GOAL #4   Title Pt will improve TUG to < 13 seconds in order to demonstrate an increase in LE strength and improved dynamic balance.   Baseline 17 seconds 10/29: 20 seconds   Time 12   Period Weeks   Status On-going     PT LONG TERM GOAL #5   Title Pt will demonstrate an increase in Berg Balance Assessment by at least 10 points in order to demonstrate improved dynamic and static balance, reducing overall risk of falls.   Baseline 36/56 10/29: 46/56   Time 12   Period Weeks   Status Achieved               Plan - 07/15/17 1134    Clinical Impression Statement Patient progressing with functional strength and balance.Performs dynamic tasks with minimal trunk sway and no LOB. RLE is utilized less by patient for more demanding tasks however patient demonstrates ability to use it upon visual and verbal cueing. Patient will continue to benefit from skilled physical therapy to increase safety and decrease fall risk.    Rehab Potential Good   Clinical Impairments Affecting Rehab Potential transportation limitations, living situation   PT Frequency 2x / week   PT Duration 12 weeks   PT Treatment/Interventions Cryotherapy;Moist Heat;Gait training;Therapeutic activities;Therapeutic exercise;Balance training;Neuromuscular re-education;Patient/family education;Manual techniques;Passive range of motion;Dry needling   PT Next Visit Plan ambulation speed changes    PT Home Exercise Plan review HEP, basketball pass on airex pad, walking football/basketball pass   Consulted and Agree with Plan of Care Patient;Other (Comment)      Patient will benefit from skilled therapeutic intervention in order to improve the following deficits and impairments:  Abnormal gait, Decreased activity tolerance, Decreased balance, Decreased coordination, Decreased endurance, Decreased mobility, Decreased safety awareness, Decreased strength, Difficulty  walking, Decreased cognition  Visit Diagnosis: Muscle weakness (generalized)  Other lack of coordination  Unsteadiness on feet     Problem List Patient Active Problem List   Diagnosis Date Noted  . Major depressive disorder   . Hepatitis C antibody test positive 05/16/2017  . Acute ischemic left MCA stroke ([facility])   . Down'Liu syndrome   . PFO with atrial septal aneurysm     , PT, DPT     07/15/2017, 11:54 AM  [facility] MAIN REHAB SERVICES [address] [place], [place], [address] Phone: [phone]   Fax:  [phone]  Name: Everado Broady MRN: [id number] Date of Birth: 06/14/1961   

## 2017-07-15 NOTE — Therapy (Signed)
[place] MAIN [facility] SERVICES [address] [place], [place], [address] Phone: [phone]   Fax:  [phone]  Occupational Therapy Treatment/G-Code Note  Patient Details  Name: Jet Armbrust MRN: [id number] Date of Birth: 1961-07-13 Referring Provider: Dr. Erling Cruz  Encounter Date: 07/15/2017      OT End of Session - 07/15/17 0934    Visit Number 10   Number of Visits 24   Date for OT Re-Evaluation 08/28/17   Authorization Type Medicare G code 10 of 10   OT Start Time 0915   OT Stop Time 1000   OT Time Calculation (min) 45 min   Activity Tolerance Patient tolerated treatment well   Behavior During Therapy [facility] for tasks assessed/performed      Past Medical History:  Diagnosis Date  . COPD (chronic obstructive pulmonary disease) ([place])   . Hepatitis C   . Hx of cholecystectomy   . Hypothyroidism   . Kidney cysts   . Major depressive disorder    Was treated at facility in [place] (D and G) years ago  . Tobacco abuse   . Vitamin D deficiency     Past Surgical History:  Procedure Laterality Date  . LAPAROSCOPIC CHOLECYSTECTOMY      There were no vitals filed for this visit.      Subjective Assessment - 07/15/17 0933    Subjective  Pt. reports he is going to a [employer] today.   Patient is accompained by: Family member   Pertinent History Pt. is a 56 y.o. 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 Pt. presents with residual UE, and LE weakness. Pt. was hospitalized, and transferred to inpatient Rehab at [facility]. Pt. is on a Dysphagia 2 diet with thickened liquids. Pt. resides in a [place]. Pt. PMHx includes: Down's Syndrome, Major Depressive Disorder, Constipation, and COPD.   Currently in Pain? No/denies      OT TREATMENT    Neuro muscular re-education:  Pt. worked on grasping 1/2" beads, and placed them on a horizontal dowel  with her right hand. Pt. Worked on stacking 3/4" small checkers.   Therapeutic Exercise:  Pt. worked on the [employer] for 8 min. with constant monitoring of the BUEs. Pt. worked on changing, and alternating forward reverse position every 2 min. rest breaks were required. Pt. performed 2# dowel ex. For UE strengthening secondary to weakness. Bilateral shoulder flexion, chest press, circular patterns, and elbow flexion/extension were performed. 2# dumbbell ex. for elbow flexion and extension, forearm supination/pronation, wrist flexion/extension, and radial deviation. Pt. requires rest breaks and verbal cues for proper technique.                             OT Education - 07/15/17 0934    Education provided Yes   Education Details UE ther. ex.   Person(s) Educated Patient   Methods Explanation   Comprehension Verbalized understanding           OT Long Term Goals - 07/15/17 0947      OT LONG TERM GOAL #1   Title Pt. will improve bilateral UE strength by 2 mm grades to assist with ADLs/IADLs.   Baseline Impaired UE strength   Time 12   Period Weeks   Status On-going   Target Date 08/28/17     OT LONG TERM GOAL #2  Title Pt. will improve bilateral grip strength to be able to hold aand use a vacuum   Baseline Limited bilateral grip strength   Time 12   Period Weeks   Status On-going   Target Date 08/28/17     OT LONG TERM GOAL #3   Title Pt. will improve bilateral hand [facility] skills to assist with manipulating ADL objects.   Baseline Limited bilateral [facility] skills   Time 12   Period Weeks   Status On-going   Target Date 08/28/17     OT LONG TERM GOAL #4   Title Pt. will improve bilateral pinch strength to be able to independently open containers   Baseline Impaired pinch strength   Time 12   Period Weeks   Status New   Target Date 08/28/17     OT LONG TERM GOAL #5   Title Pt. will improve bilateral eye hand coordination skills to be able to dribble a  basketball.   Baseline Impaired   Time 12   Period Weeks   Status On-going   Target Date 08/28/17               Plan - 07/22/2017 0935    Clinical Impression Statement Pt. continues to present with weakness, and limited coordination. Pt. requires visual demonstration, verbal, cues, and assist for [facility] activities, and ther, ex. Pt. continues to work on improving UE functioning for improved engagement in ADLs, and IADL tasks.    Occupational performance deficits (Please refer to evaluation for details): ADL's;IADL's   Rehab Potential Good   OT Frequency 2x / week   OT Duration 12 weeks   OT Treatment/Interventions Self-care/ADL training;Therapeutic exercise;Neuromuscular education;DME and/or AE instruction;Therapeutic activities;Therapeutic exercises   Consulted and Agree with Plan of Care Patient      Patient will benefit from skilled therapeutic intervention in order to improve the following deficits and impairments:  Impaired UE functional use, Decreased knowledge of precautions, Decreased strength, Decreased range of motion, Decreased activity tolerance, Impaired perceived functional ability, Decreased coordination, Decreased knowledge of use of DME, Impaired vision/preception, Decreased cognition  Visit Diagnosis: Muscle weakness (generalized)  Other lack of coordination      G-Codes - 07-22-17 0950    Functional Assessment Tool Used (Outpatient only) Clinical Judgement based on pt. current functional level   Functional Limitation Self care   Self Care Current Status (K0254) At least 20 percent but less than 40 percent impaired, limited or restricted   Self Care Goal Status (Y7062) 0 percent impaired, limited or restricted      Problem List Patient Active Problem List   Diagnosis Date Noted  . Major depressive disorder   . Hepatitis C antibody test positive 05/16/2017  . Acute ischemic left MCA stroke ([place])   . Down's syndrome   . PFO with atrial septal aneurysm      Harrel Carina, MS, OTR/L 07/22/2017, 10:00 AM  [place] MAIN [facility] SERVICES [place], [place], [address] Phone: [phone]   Fax:  [phone]  Name: Earley Grobe MRN: [id number] Date of Birth: 1961/06/27

## 2017-07-15 NOTE — Therapy (Signed)
[place] MAIN [facility] SERVICES [address] [place], [place], [address] Phone: [phone]   Fax:  [phone]  Speech Language Pathology Treatment  Patient Details  Name: Mario Liu MRN: [id number] Date of Birth: [birth year] Referring Provider: Bary Leriche  Encounter Date: 07/15/2017      End of Session - 07/15/17 1215    Visit Number 8   Number of Visits 17   Date for SLP Re-Evaluation 08/01/17   SLP Start Time 1000   SLP Stop Time  1050   SLP Time Calculation (min) 50 min   Activity Tolerance Patient tolerated treatment well      Past Medical History:  Diagnosis Date  . COPD (chronic obstructive pulmonary disease) ([place])   . Hepatitis C   . Hx of cholecystectomy   . Hypothyroidism   . Kidney cysts   . Major depressive disorder    Was treated at facility in [place] (D and G) years ago  . Tobacco abuse   . Vitamin D deficiency     Past Surgical History:  Procedure Laterality Date  . LAPAROSCOPIC CHOLECYSTECTOMY      There were no vitals filed for this visit.      Subjective Assessment - 07/15/17 1215    Subjective Patient states that he feels good   Currently in Pain? No/denies               ADULT SLP TREATMENT - 07/15/17 0001      General Information   Behavior/Cognition Alert;Cooperative;Pleasant mood;Requires cueing   HPI 56 year old man with PMH significant for depression, Down's syndrome (currently living in a group home), COPD, and tobacco use who presents with aphasia the morning of 05/14/2017. History obtained from group home caregivers. Head CT at [facility] demonstrated hypodensity in L MCA territory, consistent with infarct. At baseline, patient is able to walk on his own, talk, dress himself, do some chores, but unable to complete activities of daily living, including cooking, cleaning, and needs some help with bathing. Caregivers state that he did recognize who they were and that his speech is still  different than it has been. The patient's communication baseline is not clear.     Treatment Provided   Treatment provided Cognitive-Linquistic     Pain Assessment   Pain Assessment No/denies pain     Cognitive-Linquistic Treatment   Treatment focused on Dysarthria;Aphasia   Skilled Treatment SPEECH: Name pictured objects with 50% intelligibility, errors include open syllables and deletion of syllables.  Patient is approximately 70% intelligible in known contexts and 50% in less structured contexts/conversation.  AUDITORY COMPREHENSION:  Complete 2 unit processing tasks with 80% accuracy, waning to 50% as patient fatigued/lost interest.     Assessment / Recommendations / Plan   Plan Continue with current plan of care     Progression Toward Goals   Progression toward goals Progressing toward goals          SLP Education - 07/15/17 1215    Education provided Yes   Education Details say all the sounds and syllables in a word   Person(s) Educated Patient   Methods Explanation   Comprehension Verbalized understanding            SLP Long Term Goals - 06/01/17 1358      SLP LONG TERM GOAL #1   Title Pt will improve speech intelligibility for words/phrases by controlling rate of speech, over-articulation, correct phonetic placement, and increased loudness to achieve 80% intelligibility.  Time 8   Period Weeks   Status New   Target Date 08/01/17     SLP LONG TERM GOAL #2   Title Patient will complete 2 unit processing tasks with 80% accuracy without the need of repetition of task instructions or significant delays in responding.   Time 8   Period Weeks   Status New   Target Date 08/01/17     SLP LONG TERM GOAL #3   Title Patient will generate meaningful and intelligible phrase to complete simple/concrete linguistic task with 80% accuracy.   Time 8   Period Weeks   Status New   Target Date 08/01/17          Plan - 07/15/17 1216    Clinical Impression Statement  The patient is presenting with dysarthria and aphasia characterized by unintelligible speech (due to imprecise articulation as well as phonological deviations), telegraphic/simplified speech, and inconsistent auditory comprehension for simple commands/questions. Patient demonstrates modest improvement in speech intelligibility.  The patient will continue to benefit from skilled speech therapy to maximize functional communication.   Speech Therapy Frequency 2x / week   Duration Other (comment)   Treatment/Interventions Patient/family education;SLP instruction and feedback;Functional tasks   Potential to Achieve Goals Fair   Potential Considerations Ability to learn/carryover information;Co-morbidities;Previous level of function;Family/community support;Medical prognosis;Severity of impairments;Cooperation/participation level   SLP Home Exercise Plan practice speaking slowly and clearly    Consulted and Agree with Plan of Care Patient      Patient will benefit from skilled therapeutic intervention in order to improve the following deficits and impairments:   Dysarthria and anarthria  Aphasia    Problem List Patient Active Problem List   Diagnosis Date Noted  . Major depressive disorder   . Hepatitis C antibody test positive 05/16/2017  . Acute ischemic left MCA stroke ([place])   . Down's syndrome   . PFO with atrial septal aneurysm    [name], MS/CCC- SLP  [name] 07/15/2017, 12:17 PM  [place] MAIN [facility] SERVICES [address] [place], [place], [address] Phone: [phone]   Fax:  [phone]   Name: Mario Liu MRN: [id number] Date of Birth: 11/20/1960

## 2017-07-20 ENCOUNTER — Ambulatory Visit: Payer: Medicare Other | Admitting: Speech Pathology

## 2017-07-20 ENCOUNTER — Ambulatory Visit: Payer: Medicare Other | Admitting: Physical Therapy

## 2017-07-20 ENCOUNTER — Ambulatory Visit: Payer: Medicare Other | Attending: Physical Medicine and Rehabilitation | Admitting: Occupational Therapy

## 2017-07-20 DIAGNOSIS — M6281 Muscle weakness (generalized): Secondary | ICD-10-CM | POA: Insufficient documentation

## 2017-07-20 DIAGNOSIS — R4701 Aphasia: Secondary | ICD-10-CM | POA: Insufficient documentation

## 2017-07-20 DIAGNOSIS — R278 Other lack of coordination: Secondary | ICD-10-CM | POA: Insufficient documentation

## 2017-07-20 DIAGNOSIS — R2681 Unsteadiness on feet: Secondary | ICD-10-CM | POA: Insufficient documentation

## 2017-07-20 DIAGNOSIS — R471 Dysarthria and anarthria: Secondary | ICD-10-CM | POA: Insufficient documentation

## 2017-07-23 ENCOUNTER — Ambulatory Visit: Payer: Medicare Other | Admitting: Occupational Therapy

## 2017-07-23 ENCOUNTER — Ambulatory Visit: Payer: Medicare Other | Admitting: Speech Pathology

## 2017-07-23 ENCOUNTER — Encounter: Payer: Self-pay | Admitting: Occupational Therapy

## 2017-07-23 ENCOUNTER — Encounter: Payer: Self-pay | Admitting: Speech Pathology

## 2017-07-23 DIAGNOSIS — R2681 Unsteadiness on feet: Secondary | ICD-10-CM | POA: Diagnosis present

## 2017-07-23 DIAGNOSIS — M6281 Muscle weakness (generalized): Secondary | ICD-10-CM | POA: Diagnosis present

## 2017-07-23 DIAGNOSIS — R4701 Aphasia: Secondary | ICD-10-CM

## 2017-07-23 DIAGNOSIS — R471 Dysarthria and anarthria: Secondary | ICD-10-CM

## 2017-07-23 DIAGNOSIS — R278 Other lack of coordination: Secondary | ICD-10-CM | POA: Diagnosis present

## 2017-07-23 NOTE — Therapy (Signed)
[place] MAIN [facility] SERVICES [address] [place], [place], [address] Phone: [phone]   Fax:  [phone]  Speech Language Pathology Treatment/Progress Note  Patient Details  Name: Mario Liu MRN: [id number] Date of Birth: March 24, 1961 Referring Provider: Bary Leriche   Encounter Date: 07/23/2017    Past Medical History:  Diagnosis Date  . COPD (chronic obstructive pulmonary disease) ([place])   . Hepatitis C   . Hx of cholecystectomy   . Hypothyroidism   . Kidney cysts   . Major depressive disorder    Was treated at facility in [place] (D and G) years ago  . Tobacco abuse   . Vitamin D deficiency     Past Surgical History:  Procedure Laterality Date  . LAPAROSCOPIC CHOLECYSTECTOMY      There were no vitals filed for this visit.  Subjective Assessment - 07/23/17 1246    Subjective  Patient participated willingly throughout entire session    Currently in Pain?  No/denies            ADULT SLP TREATMENT - 07/23/17 0001      General Information   Behavior/Cognition  Alert;Cooperative;Pleasant mood;Requires cueing    HPI  56 year old man with PMH significant for depression, Down's syndrome (currently living in a group home), COPD, and tobacco use who presents with aphasia the morning of 05/14/2017. History obtained from group home caregivers. Head CT at [facility] demonstrated hypodensity in L MCA territory, consistent with infarct. At baseline, patient is able to walk on his own, talk, dress himself, do some chores, but unable to complete activities of daily living, including cooking, cleaning, and needs some help with bathing. Caregivers state that he did recognize who they were and that his speech is still different than it has been. The patient's communication baseline is not clear.      Treatment Provided   Treatment provided  Cognitive-Linquistic      Pain Assessment   Pain Assessment  No/denies pain       Cognitive-Linquistic Treatment   Treatment focused on  Dysarthria    Skilled Treatment  DYSPHAGIA: Clinical swallow evaluation conducted-patient exhibited adequate swallow mechanism and no overt aspiration noted, see details below.   SPEECH: Patient judged to be about 75% intelligible in known contexts and about 55% in unknown contexts      Assessment / Recommendations / [place] with current plan of care      Progression Toward Goals   Progression toward goals  Progressing toward goals      Swallowing History: SLP assessment 05/19/2017: Patient presents with moderate oral dysphagia demonstrating impairment in oral control and reduce weakness/sensation on right buccal and lingual resulting in premature spillage, weak labial seal resulting in anterior spillage and mild lingual residue/buccal pocketing on right side with thicker textured foods (dys 2 and regular), which is further supported by MBS results 05/15/2017 indicating intermittent aspiration of all liquids including secretions. Pt demonstrates overall WFL oral motor movements, with mild right side weakness. Pt demonstrated overt s/s aspiration post PO consumption of regular textured foods and thin liquids via straw with impulsive behavior of multiple sips.  Clinical Swallowing Assessment (07/23/2017):  Patient able to chew cheerios and graham crackers with rotary mastication.  He is able to pull thin liquid via straw.  He has no anterior loss of solid / liquid or oral residue post swallow.  He demonstrates no clinical indicators of aspiration- no vocal quality change, no cough/throat  clear, no observable change in respiration.  Patient is impulsive and may over-fill his mouth or gulp liquids.    SLP Education - 07/23/17 1246    Education provided  Yes    Education Details  take small bites while eating; speak slowly and clearly     Person(s) Educated  Patient    Methods  Explanation    Comprehension  Verbalized understanding          SLP Long Term Goals - 07/23/17 1247      SLP LONG TERM GOAL #1   Title  Pt will improve speech intelligibility for words/phrases by controlling rate of speech, over-articulation, correct phonetic placement, and increased loudness to achieve 80% intelligibility.    Time  8    Period  Weeks    Status  Partially Met      SLP LONG TERM GOAL #2   Title  Patient will complete 2 unit processing tasks with 80% accuracy without the need of repetition of task instructions or significant delays in responding.    Time  8    Period  Weeks    Status  Partially Met      SLP LONG TERM GOAL #3   Title  Patient will generate meaningful and intelligible phrase to complete simple/concrete linguistic task with 80% accuracy.    Time  8    Period  Weeks    Status  Partially Met       Plan - 07/23/17 1246    Clinical Impression Statement  Clinical assessment of swallowing demonstrates significant improvement in swallowing function than reported 05/19/2017 while the patient was in rehab.  Clinical assessment supports upgrade diet to mechanical soft with thin liquid (may use straw).  He is impulsive and should have supervision to insure he takes small bites/sips and that he swallows one bite/sip before taking another.  The patient continues to present with dysarthria and aphasia characterized by reduced speech intelligibility  (due to imprecise articulation as well as phonological deviations), telegraphic/simplified speech, and inconsistent auditory comprehension for simple commands/questions. Patient demonstrates modest improvement in speech intelligibility.    Speech Therapy Frequency  2x / week    Duration  Other (comment)    Treatment/Interventions  Patient/family education;SLP instruction and feedback;Functional tasks    Potential to Achieve Goals  Fair    Potential Considerations  Ability to learn/carryover information;Co-morbidities;Previous level of function;Family/community support;Medical  prognosis;Severity of impairments;Cooperation/participation level    SLP Home Exercise Plan  practice speaking slowly and clearly     Consulted and Agree with Plan of Care  Patient       Patient will benefit from skilled therapeutic intervention in order to improve the following deficits and impairments:   Dysarthria and anarthria  Aphasia    Problem List Patient Active Problem List   Diagnosis Date Noted  . Major depressive disorder   . Hepatitis C antibody test positive 05/16/2017  . Acute ischemic left MCA stroke ([place])   . Down's syndrome   . PFO with atrial septal aneurysm     Dilara Navarrete [employer] Liu 07/23/2017, 1:55 PM  [place] MAIN [facility] SERVICES [address] [place], [place], [address] Phone: [phone]   Fax:  [phone]   Name: Larry Alcock MRN: [id number] Date of Birth: 1961-01-10

## 2017-07-23 NOTE — Therapy (Signed)
[place] MAIN [facility] SERVICES [address] [place], [place], [address] Phone: [phone]   Fax:  [phone]  Occupational Therapy Treatment  Patient Details  Name: [place] Liu MRN: [id number] Date of Birth: 03-03-1961 Referring Provider: Dr. Erling Cruz   Encounter Date: 07/23/2017  OT End of Session - 07/23/17 1156    Visit Number  11    Number of Visits  24    Date for OT Re-Evaluation  08/28/17    Authorization Type  Medicare G code 1 of 10    OT Start Time  8366    OT Stop Time  1101    OT Time Calculation (min)  38 min    Activity Tolerance  Patient tolerated treatment well    Behavior During Therapy  [facility] for tasks assessed/performed       Past Medical History:  Diagnosis Date  . COPD (chronic obstructive pulmonary disease) ([place])   . Hepatitis C   . Hx of cholecystectomy   . Hypothyroidism   . Kidney cysts   . Major depressive disorder    Was treated at facility in [place] (D and G) years ago  . Tobacco abuse   . Vitamin D deficiency     Past Surgical History:  Procedure Laterality Date  . LAPAROSCOPIC CHOLECYSTECTOMY      There were no vitals filed for this visit.  Subjective Assessment - 07/23/17 1040    Subjective   Pt. reports he is going to his sister's home for Thanksgiving.    Patient is accompained by:  Family member    Pertinent History  Pt. is a 56 y.o. 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 Pt. presents with residual UE, and LE weakness. Pt. was hospitalized, and transferred to inpatient Rehab at [facility]. Pt. is on a Dysphagia 2 diet with thickened liquids. Pt. resides in a [place]. Pt. PMHx includes: Down's Syndrome, Major Depressive Disorder, Constipation, and COPD.    Currently in Pain?  No/denies        OT TREATMENT    Therapeutic Exercise:  Pt. worked on the [employer] for 8 min. With constant monitoring of  the BUEs. Pt. worked on the forward motion on level 4. Rest breaks were required. Pt. performed 2# dowel ex. For UE strengthening secondary to weakness. Bilateral shoulder flexion, chest press, circular patterns, and elbow flexion/extension were performed. Pt. Required visual cues, and assist count, to perform the tasks slowly, and for correct form. Pt. performed gross gripping with grip strengthener. Pt. worked on sustaining grip while grasping pegs and reaching at various heights. Gripper was placed in the 2nd, and 3rd resistive slot with the white resistive spring.                       OT Education - 07/23/17 1156    Education provided  Yes    Education Details  ADLs    Person(s) Educated  Patient    Methods  Explanation    Comprehension  Verbalized understanding         OT Long Term Goals - 07/15/17 0947      OT LONG TERM GOAL #1   Title  Pt. will improve bilateral UE strength by 2 mm grades to assist with ADLs/IADLs.    Baseline  Impaired UE strength    Time  12    Period  Weeks    Status  On-going    Target Date  08/28/17      OT LONG TERM GOAL #2   Title  Pt. will improve bilateral grip strength to be able to hold aand use a vacuum    Baseline  Limited bilateral grip strength    Time  12    Period  Weeks    Status  On-going    Target Date  08/28/17      OT LONG TERM GOAL #3   Title  Pt. will improve bilateral hand [facility] skills to assist with manipulating ADL objects.    Baseline  Limited bilateral [facility] skills    Time  12    Period  Weeks    Status  On-going    Target Date  08/28/17      OT LONG TERM GOAL #4   Title  Pt. will improve bilateral pinch strength to be able to independently open containers    Baseline  Impaired pinch strength    Time  12    Period  Weeks    Status  New    Target Date  08/28/17      OT LONG TERM GOAL #5   Title  Pt. will improve bilateral eye hand coordination skills to be able to dribble a basketball.    Baseline   Impaired    Time  12    Period  Weeks    Status  On-going    Target Date  08/28/17            Plan - 07/23/17 1157    Clinical Impression Statement Pt. reports he is wearing a patch to help him quit smoking. Pt. brought in 2 cigarettes to the treatment session. Pt. continues to work on improving UE strength, and coordination skills for improved ADL, and IADL functioning. Pt. continues to work on improving UE functioning during ADLs, and IADL tasks.     Occupational performance deficits (Please refer to evaluation for details):  ADL's;IADL's    Rehab Potential  Good    OT Frequency  2x / week    OT Duration  12 weeks    OT Treatment/Interventions  Self-care/ADL training;Therapeutic exercise;Neuromuscular education;DME and/or AE instruction;Therapeutic activities;Therapeutic exercises    Consulted and Agree with Plan of Care  Patient       Patient will benefit from skilled therapeutic intervention in order to improve the following deficits and impairments:  Impaired UE functional use, Decreased knowledge of precautions, Decreased strength, Decreased range of motion, Decreased activity tolerance, Impaired perceived functional ability, Decreased coordination, Decreased knowledge of use of DME, Impaired vision/preception, Decreased cognition  Visit Diagnosis: Muscle weakness (generalized)  Other lack of coordination    Problem List Patient Active Problem List   Diagnosis Date Noted  . Major depressive disorder   . Hepatitis C antibody test positive 05/16/2017  . Acute ischemic left MCA stroke ([place])   . Down's syndrome   . PFO with atrial septal aneurysm     Harrel Carina, MS, OTR/L 07/23/2017, 12:05 PM  [place] MAIN [facility] SERVICES [address] [place], [place], [address] Phone: [phone]   Fax:  [phone]  Name: [place] Liu MRN: [id number] Date of Birth: [birth year]

## 2017-07-27 ENCOUNTER — Inpatient Hospital Stay: Payer: Medicare Other | Attending: Oncology

## 2017-07-27 ENCOUNTER — Ambulatory Visit: Payer: Medicare Other | Admitting: Oncology

## 2017-07-27 DIAGNOSIS — E039 Hypothyroidism, unspecified: Secondary | ICD-10-CM | POA: Insufficient documentation

## 2017-07-27 DIAGNOSIS — F329 Major depressive disorder, single episode, unspecified: Secondary | ICD-10-CM | POA: Insufficient documentation

## 2017-07-27 DIAGNOSIS — N281 Cyst of kidney, acquired: Secondary | ICD-10-CM | POA: Insufficient documentation

## 2017-07-27 DIAGNOSIS — Z9049 Acquired absence of other specified parts of digestive tract: Secondary | ICD-10-CM | POA: Insufficient documentation

## 2017-07-27 DIAGNOSIS — Z8673 Personal history of transient ischemic attack (TIA), and cerebral infarction without residual deficits: Secondary | ICD-10-CM | POA: Diagnosis not present

## 2017-07-27 DIAGNOSIS — J449 Chronic obstructive pulmonary disease, unspecified: Secondary | ICD-10-CM | POA: Diagnosis not present

## 2017-07-27 DIAGNOSIS — Z7902 Long term (current) use of antithrombotics/antiplatelets: Secondary | ICD-10-CM | POA: Diagnosis not present

## 2017-07-27 DIAGNOSIS — Z87891 Personal history of nicotine dependence: Secondary | ICD-10-CM | POA: Insufficient documentation

## 2017-07-27 DIAGNOSIS — D696 Thrombocytopenia, unspecified: Secondary | ICD-10-CM

## 2017-07-27 DIAGNOSIS — Z7982 Long term (current) use of aspirin: Secondary | ICD-10-CM | POA: Diagnosis not present

## 2017-07-27 DIAGNOSIS — Z79899 Other long term (current) drug therapy: Secondary | ICD-10-CM | POA: Insufficient documentation

## 2017-07-27 LAB — CBC WITH DIFFERENTIAL/PLATELET
BASOS ABS: 0 10*3/uL (ref 0–0.1)
BASOS PCT: 1 %
EOS ABS: 0.3 10*3/uL (ref 0–0.7)
EOS PCT: 6 %
HCT: 43.5 % (ref 40.0–52.0)
HEMOGLOBIN: 14.2 g/dL (ref 13.0–18.0)
LYMPHS PCT: 44 %
Lymphs Abs: 2.3 10*3/uL (ref 1.0–3.6)
MCH: 28.3 pg (ref 26.0–34.0)
MCHC: 32.7 g/dL (ref 32.0–36.0)
MCV: 86.6 fL (ref 80.0–100.0)
Monocytes Absolute: 0.5 10*3/uL (ref 0.2–1.0)
Monocytes Relative: 10 %
Neutro Abs: 2.2 10*3/uL (ref 1.4–6.5)
Neutrophils Relative %: 41 %
PLATELETS: 128 10*3/uL — AB (ref 150–440)
RBC: 5.02 MIL/uL (ref 4.40–5.90)
RDW: 13.8 % (ref 11.5–14.5)
WBC: 5.3 10*3/uL (ref 3.8–10.6)

## 2017-07-27 LAB — COMPREHENSIVE METABOLIC PANEL
ALBUMIN: 3.9 g/dL (ref 3.5–5.0)
ALK PHOS: 50 U/L (ref 38–126)
ALT: 17 U/L (ref 17–63)
AST: 22 U/L (ref 15–41)
Anion gap: 8 (ref 5–15)
BUN: 14 mg/dL (ref 6–20)
CALCIUM: 9.5 mg/dL (ref 8.9–10.3)
CHLORIDE: 102 mmol/L (ref 101–111)
CO2: 26 mmol/L (ref 22–32)
CREATININE: 0.96 mg/dL (ref 0.61–1.24)
GFR calc non Af Amer: >60 mL/min (ref >60)
GLUCOSE: 120 mg/dL — AB (ref 65–99)
Potassium: 4.1 mmol/L (ref 3.5–5.1)
SODIUM: 136 mmol/L (ref 135–145)
Total Bilirubin: 0.6 mg/dL (ref 0.3–1.2)
Total Protein: 7.8 g/dL (ref 6.5–8.1)

## 2017-07-27 NOTE — Progress Notes (Signed)
[facility] Initial Visit:  Patient Care Team: [name], FNP as PCP - General (Family Medicine)  CHIEF COMPLAINTS/PURPOSE OF CONSULTATION: Low platelets  HISTORY OF PRESENTING ILLNESS: Mario Liu 56 y.o. male is here for evaluation of low platelet count. The patient lives in a group home and is accompanied by caregiver from group home. He is referred to [place] for evaluation of thrombocytopenia. He had labs on with primary care provider on December 15 2016 which showed WBC 5.3 hemoglobin 14.3 MCV 88 RDW 13.8 MPV 11.37 platelet 111 lymphocyte 46.69% absolute lymphocytes 2.5 normal is BMPnormal LFT. It was noted that hepatitis C antibody reactive. Patient was seen by [facility] for workupof her positive hepatitis C antibody Further tests including HCV RNA by PCR was negative.  Hep B core antibody negative, Hb surface antibody nonreactive,A antibody positive,Hep A IgM negative. So that patient did not really have hepatitis C, additional ultrasound and fibrocystic was canceled by GI.Marland Kitchen Patient reports feeling well, good energy level, good appetite, denies any weight loss fatigue, fever or chills. Patient is a poor historian. He told me that he used to drink liquor but haven't drink lately. INTERVAL HISTORY Patient presents to clinic for follow-up of thrombocytopenia. In September 2018, patient had a stroke, acute ischemic left MCA stroke. His in getting physical therapy. Denies any easy bruising or bleeding. He appears to be less duct if compared to prior to the stroke. Able to have simple conversation with me.Denies any recent infection, bleeding, surgery. He has some dysphagia and speech intelligibility which slowly improving with the physical therapy and speech therapy according to the group home staff.  Review of Systems  Constitutional: Negative.  Negative for appetite change and chills.  HENT:  Negative.  Negative for hearing loss and lump/mass.   Eyes: Negative.   Negative for eye problems.  Respiratory: Negative.  Negative for chest tightness.   Cardiovascular: Negative.  Negative for chest pain.  Gastrointestinal: Negative.  Negative for abdominal distention and abdominal pain.  Endocrine: Negative.  Negative for hot flashes.  Genitourinary: Negative.  Negative for difficulty urinating and dysuria.   Musculoskeletal: Negative for arthralgias.  Skin: Negative.  Negative for itching.  Neurological: Negative.  Negative for dizziness.       Recent stroke  Hematological: Negative.   Psychiatric/Behavioral: Negative.  The patient is not nervous/anxious.     MEDICAL HISTORY: Past Medical History:  Diagnosis Date  . COPD (chronic obstructive pulmonary disease) ([place])   . Hepatitis C   . Hx of cholecystectomy   . Hypothyroidism   . Kidney cysts   . Major depressive disorder    Was treated at facility in [place] ([facility] years ago  . Tobacco abuse   . Vitamin D deficiency     SURGICAL HISTORY: Past Surgical History:  Procedure Laterality Date  . LAPAROSCOPIC CHOLECYSTECTOMY      SOCIAL HISTORY: Social History   Socioeconomic History  . Marital status: Single    Spouse name: Not on file  . Number of children: Not on file  . Years of education: Not on file  . Highest education level: Not on file  Social Needs  . Financial resource strain: Not on file  . Food insecurity - worry: Not on file  . Food insecurity - inability: Not on file  . Transportation needs - medical: Not on file  . Transportation needs - non-medical: Not on file  Occupational History  . Not on file  Tobacco Use  . Smoking status: Former Smoker    Packs/day: 1.00    Years: 39.00    Pack years: 39.00    Types: Cigarettes  . Smokeless tobacco: Never Used  . Tobacco comment: Pt states he quit smoking 1 mth ago (03/20/17)   Substance and Sexual Activity  . Alcohol use: No  . Drug use: No  . Sexual activity: Not on file  Other Topics Concern  . Not on file   Social History Narrative  . Not on file    FAMILY HISTORY Family History  Adopted: Yes  Problem Relation Age of Onset  . Diabetes Maternal Grandmother     ALLERGIES:  has No Known Allergies.  MEDICATIONS:  Current Outpatient Medications  Medication Sig Dispense Refill  . acetaminophen (TYLENOL) 325 MG tablet Take 1-2 tablets (325-650 mg total) by mouth every 4 (four) hours as needed for mild pain.    Marland Kitchen albuterol (PROVENTIL HFA;VENTOLIN HFA) 108 (90 Base) MCG/ACT inhaler Inhale 2 puffs into the lungs every 6 (six) hours as needed for wheezing or shortness of breath.    Marland Kitchen alendronate (FOSAMAX) 70 MG tablet Take 70 mg by mouth once a week. Fridays    . aspirin 325 MG tablet Take 1 tablet (325 mg total) by mouth daily. 90 tablet 0  . atorvastatin (LIPITOR) 40 MG tablet Take 1 tablet (40 mg total) by mouth daily at 6 PM. 30 tablet 0  . buPROPion (WELLBUTRIN XL) 150 MG 24 hr tablet Take 150 mg by mouth daily.     . calcium carbonate (OS-CAL) 600 MG TABS tablet Take 600 mg by mouth 2 (two) times daily with a meal.     . cetirizine (ZYRTEC) 10 MG tablet Take 10 mg by mouth daily.    . citalopram (CELEXA) 40 MG tablet Take 40 mg by mouth daily.     . clonazePAM (KLONOPIN) 0.5 MG tablet Take 0.5 mg by mouth daily. *may also take an additional 0.'5mg'$  tab as needed for anxiety/agitation    . clopidogrel (PLAVIX) 75 MG tablet Take 1 tablet (75 mg total) by mouth daily. 30 tablet 0  . fluticasone (FLONASE) 50 MCG/ACT nasal spray Place 2 sprays into the nose daily as needed for allergies.     . polyethylene glycol (MIRALAX / GLYCOLAX) packet Take 17 g by mouth daily as needed for moderate constipation. 14 each 0  . potassium chloride (K-DUR) 10 MEQ tablet Take 10 mEq by mouth daily.     . risperiDONE (RISPERDAL) 3 MG tablet Take 3 mg by mouth 2 (two) times daily.    . traZODone (DESYREL) 50 MG tablet Take 0.5-1 tablets (25-50 mg total) by mouth at bedtime as needed for sleep.     No current  facility-administered medications for this visit.     PHYSICAL EXAMINATION:  ECOG PERFORMANCE STATUS: 1 - Symptomatic but completely ambulatory   Vitals:   07/28/17 1250  BP: 105/68  Pulse: 84  Temp: (!) 96.5 F (35.8 C)    Filed Weights   07/28/17 1250  Weight: 204 lb (92.5 kg)     Physical Exam GENERAL: No distress, well nourished.  SKIN:  No rashes or significant lesions  HEAD: Normocephalic, No masses, lesions, tenderness or abnormalities  EYES: Conjunctiva are pink, non icteric ENT: External ears normal ,lips , buccal mucosa, and tongue normal and mucous membranes are moist  LYMPH: No palpable cervical and axillary lymphadenopathy  LUNGS: Clear to auscultation, no crackles or wheezes HEART: Regular rate &  rhythm, no murmurs, no gallops, S1 normal and S2 normal  ABDOMEN: obese abdomen wall. Abdomen soft, non-tender, normal bowel sounds, IMUSCULOSKELETAL: No CVA tenderness and no tenderness on percussion of the back or rib cage.  EXTREMITIES: No edema, no skin discoloration or tenderness NEURO: Alert & oriented,     LABORATORY DATA: I have personally reviewed the data as listed: He had labs on with primary care provider on December 15 2016 which showed WBC 5.3 hemoglobin 14.3 MCV 88 RDW 13.8 MPV 11.37 platelet 111 lymphocyte 46.69% absolute lymphocytes 2.5 normal is BMPnormal LFT. It was noted that hepatitis C antibody reactive. Patient was seen by [facility] group for workupof her positive hepatitis C antibody Further tests including HCV RNA by PCR was negative.  Hep B core antibody negative, Hb surface antibody nonreactive,A antibody positive,Hep A IgM negative. CBC    Component Value Date/Time   WBC 5.3 07/27/2017 1135   RBC 5.02 07/27/2017 1135   HGB 14.2 07/27/2017 1135   HCT 43.5 07/27/2017 1135   PLT 128 (L) 07/27/2017 1135   MCV 86.6 07/27/2017 1135   MCH 28.3 07/27/2017 1135   MCHC 32.7 07/27/2017 1135   RDW 13.8 07/27/2017 1135   LYMPHSABS 2.3  07/27/2017 1135   MONOABS 0.5 07/27/2017 1135   EOSABS 0.3 07/27/2017 1135   BASOSABS 0.0 07/27/2017 1135   CMP Latest Ref Rng & Units 07/27/2017 05/21/2017 05/19/2017  Glucose 65 - 99 mg/dL 120(H) 133(H) 171(H)  BUN 6 - 20 mg/dL '14 10 12  '$ Creatinine 0.61 - 1.24 mg/dL 0.96 0.93 1.02  Sodium 135 - 145 mmol/L 136 135 136  Potassium 3.5 - 5.1 mmol/L 4.1 3.9 3.9  Chloride 101 - 111 mmol/L 102 104 107  CO2 22 - 32 mmol/L 26 23 19(L)  Calcium 8.9 - 10.3 mg/dL 9.5 8.4(L) 8.7(L)  Total Protein 6.5 - 8.1 g/dL 7.8 - 7.7  Total Bilirubin 0.3 - 1.2 mg/dL 0.6 - 1.0  Alkaline Phos 38 - 126 U/L 50 - 37(L)  AST 15 - 41 U/L 22 - 29  ALT 17 - 63 U/L 17 - 24  Results for OVA, MEEGAN (MRN [id number]) as of 04/27/2017 11:09  Ref. Range 04/20/2017 12:10 04/20/2017 12:21  Folate Latest Ref Range: >5.9 ng/mL  11.0  Vitamin B12 Latest Ref Range: 180 - 914 pg/mL 392    Results for BOOMER, WINDERS (MRN [id number]) as of 04/27/2017 11:09  Ref. Range 04/20/2017 12:21  LDH Latest Ref Range: 98 - 192 U/L 185   Peripheral lab flow cytometry No monoclonal B cell population is detected. kappa:lambda ratio 1.8  There is no loss of, or aberrant expression of, the pan T cell  antigens to  suggest a neoplastic T cell process.  CD4:CD8 ratio 2.5  No circulating blasts are detected. There is no immunophenotypic  evidence  of abnormal myeloid maturation.  Analysis of the leukocyte population shows: granulocytes 50%,  monocytes  10%, lymphocytes 40%, blasts <0.5%, B cells 8%, T cells 30%, NK cells  2%.    RADIOGRAPHIC STUDIES: I have personally reviewed the radiological images as listed and agree with the findings in the report US abdomen 04/23/2017 FINDINGS: Gallbladder: Surgically absent.Common bile duct: Diameter: 3 mm. No intrahepatic, common hepatic, common bile duct dilatation.Liver: No focal lesion identified. Within normal limits inparench ymal echogenicity. IVC: No abnormality visualized in areas which can be  interrogated.Portions of the infrahepatic inferior vena cava are obscured by gas. Pancreas: Pancreas essentially completely obscured by gas. Spleen: Size and  appearance within normal limits. Spleen measures 9.1 x 10.0 x 3.9 cm with a measured splenic volume of 186 cubic cm. Right Kidney: Length: 10.8 cm. Echogenicity within normal limits. No hydronephrosis visualized. There is a mildly complex cystic structure in the lower pole the right kidney measuring 4.4 x 2.3 x 2.8 cm, similar to prior study. There is a nearby second mildly complex cystic structure toward the mid right kidney measuring 1.4 x1.4 x 1.7 cm, essentially stable from prior study. There is a cyst in the midportion of the kidney measuring 2.7 x 2.4 x 2.7 cm. No newrenal lesions evident. Left Kidney: Length: 11.5 cm. Echogenicity within normal limits. No hydronephrosis visualized. There is a simple cyst in the mid left kidney measuring 0.9 x 1.0 x 1.1 cm. Abdominal aorta: No aneurysm visualized in areas which can be interrogated. Portions of the aorta are obscured by gas. Other findings: No demonstrable ascites.  IMPRESSION:1.  No liver or splenic lesions.2.  Gallbladder absent.3. Stable mildly complex cystic areas in the right kidney. No new renal lesion identified on the right. Small simple cyst mid left kidney.4. Pancreas essentially completely obscured by gas. Portions of inferior vena cava and aorta obscured by gas. Visualized portions ofthese structures appear unremarkable.  ASSESSMENT/PLAN 1. Thrombocytopenia ([place])    # Risperidone can potentially cause thrombocytopenia. Patient's platelet improved one 50,000 in September, currently back to his baseline at 1 24,000. Discussed with patient that a thorough evaluation for his.thrombocytopenia will be bone marrow biopsy in the future. Currently his thrombocytopenia appears to be stable. He can  follow-up with me in 3 months with labs done 1 day before visit.   wasn't clear  that if patients taking any aspirin or Plavix. Neither was listed as a outpatient medication list sent from group home. Plavix was listed as discharge medication. We'll call group home and clarify.  All questions were answered. The patient knows to call the clinic with any problems, questions or concerns.   Earlie Server, MD  07/27/2017 10:40 PM

## 2017-07-28 ENCOUNTER — Encounter: Payer: Self-pay | Admitting: Oncology

## 2017-07-28 ENCOUNTER — Other Ambulatory Visit: Payer: Self-pay

## 2017-07-28 ENCOUNTER — Inpatient Hospital Stay (HOSPITAL_BASED_OUTPATIENT_CLINIC_OR_DEPARTMENT_OTHER): Payer: Medicare Other | Admitting: Oncology

## 2017-07-28 VITALS — BP 105/68 | HR 84 | Temp 96.5°F | Wt 204.0 lb

## 2017-07-28 DIAGNOSIS — F329 Major depressive disorder, single episode, unspecified: Secondary | ICD-10-CM

## 2017-07-28 DIAGNOSIS — Z87891 Personal history of nicotine dependence: Secondary | ICD-10-CM | POA: Diagnosis not present

## 2017-07-28 DIAGNOSIS — Z9049 Acquired absence of other specified parts of digestive tract: Secondary | ICD-10-CM

## 2017-07-28 DIAGNOSIS — Z7902 Long term (current) use of antithrombotics/antiplatelets: Secondary | ICD-10-CM | POA: Diagnosis not present

## 2017-07-28 DIAGNOSIS — Z8673 Personal history of transient ischemic attack (TIA), and cerebral infarction without residual deficits: Secondary | ICD-10-CM | POA: Diagnosis not present

## 2017-07-28 DIAGNOSIS — Z79899 Other long term (current) drug therapy: Secondary | ICD-10-CM | POA: Diagnosis not present

## 2017-07-28 DIAGNOSIS — N281 Cyst of kidney, acquired: Secondary | ICD-10-CM | POA: Diagnosis not present

## 2017-07-28 DIAGNOSIS — E039 Hypothyroidism, unspecified: Secondary | ICD-10-CM | POA: Diagnosis not present

## 2017-07-28 DIAGNOSIS — J449 Chronic obstructive pulmonary disease, unspecified: Secondary | ICD-10-CM | POA: Diagnosis not present

## 2017-07-28 DIAGNOSIS — D696 Thrombocytopenia, unspecified: Secondary | ICD-10-CM | POA: Diagnosis not present

## 2017-07-28 DIAGNOSIS — Z7982 Long term (current) use of aspirin: Secondary | ICD-10-CM | POA: Diagnosis not present

## 2017-07-28 NOTE — Progress Notes (Signed)
Patient here today for follow up.   

## 2017-07-28 NOTE — Addendum Note (Signed)
Addended by: Vernetta Honey on: 07/28/2017 04:30 PM   Modules accepted: Orders

## 2017-07-30 ENCOUNTER — Ambulatory Visit: Payer: Medicare Other | Admitting: Occupational Therapy

## 2017-07-30 ENCOUNTER — Ambulatory Visit: Payer: Medicare Other | Admitting: Cardiovascular Disease

## 2017-07-30 ENCOUNTER — Ambulatory Visit: Payer: Medicare Other | Admitting: Physical Therapy

## 2017-07-30 ENCOUNTER — Ambulatory Visit: Payer: Medicare Other | Admitting: Speech Pathology

## 2017-07-31 ENCOUNTER — Encounter: Payer: Self-pay | Admitting: Cardiovascular Disease

## 2017-07-31 ENCOUNTER — Ambulatory Visit (INDEPENDENT_AMBULATORY_CARE_PROVIDER_SITE_OTHER): Payer: Medicare Other | Admitting: Cardiovascular Disease

## 2017-07-31 VITALS — BP 100/62 | HR 92 | Wt 207.0 lb

## 2017-07-31 DIAGNOSIS — I253 Aneurysm of heart: Secondary | ICD-10-CM

## 2017-07-31 DIAGNOSIS — I63512 Cerebral infarction due to unspecified occlusion or stenosis of left middle cerebral artery: Secondary | ICD-10-CM

## 2017-07-31 DIAGNOSIS — F3341 Major depressive disorder, recurrent, in partial remission: Secondary | ICD-10-CM

## 2017-07-31 DIAGNOSIS — Q211 Atrial septal defect: Secondary | ICD-10-CM

## 2017-07-31 NOTE — Patient Instructions (Signed)
We will call if additional testing needed  Medication Instructions:   No medication changes made  Labwork:  No new labs needed  Testing/Procedures:  No further testing at this time   Follow-Up: It was a pleasure seeing you in the office today. Please call us if you have new issues that need to be addressed before your next appt.  [phone]  Your physician wants you to follow-up in:  As needed  If you need a refill on your cardiac medications before your next appointment, please call your pharmacy.

## 2017-07-31 NOTE — Progress Notes (Signed)
Cardiology Office Note  Date:  07/31/2017   ID:  Mario Liu, DOB 06-09-1961, MRN [id number]  PCP:  [name], FNP   Chief Complaint  Patient presents with  . other    Ref by Dr. Erling Cruz for PFO and atrial septal aneurysm CVA.     HPI:  Mario Liu is a 56 year old gentleman with past medical history of Stroke August 2018 PFO, atrial septal aneurysm Chronic hepatitis C Down syndrome Major depressive disorder Referred by Dr. Olin Hauser Liu for consultation of his PFO and stroke  Patient is poor historian, most of the history is obtained by caregiver today and patient notes Admitted May 14, 2017 with mental status changes, right-sided weakness, difficulty speaking MRI documenting left MCA infarct CTA head confirming left M2 branch occlusion  Lower extremity Dopplers negative for DVT Started on aspirin and Plavix for 3 months then Plavix alone Notes indicating not a good candidate for PFO closure, 30-day monitor High risk of dysphasia, risk of aspiration, underlying cognitive deficits, severe dysarthria Unsteady gait Notes requesting loop monitor  Echocardiogram May 15, 2017 with ejection fraction 55-60%, small PFO,   Caretaker reports that his speech has been improving Ambulate by himself, still requires monitoring   EKG personally reviewed by myself on todays visit Shows normal sinus rhythm rate 92 bpm nonspecific T wave abnormality   PMH:   has a past medical history of COPD (chronic obstructive pulmonary disease) ([place]), Hepatitis C, cholecystectomy, Hypothyroidism, Kidney cysts, Major depressive disorder, Tobacco abuse, and Vitamin D deficiency.  PSH:    Past Surgical History:  Procedure Laterality Date  . LAPAROSCOPIC CHOLECYSTECTOMY      Current Outpatient Medications  Medication Sig Dispense Refill  . albuterol (PROVENTIL HFA;VENTOLIN HFA) 108 (90 Base) MCG/ACT inhaler Inhale 2 puffs into the lungs every 6 (six) hours as needed for wheezing or shortness  of breath.    Marland Kitchen alendronate (FOSAMAX) 70 MG tablet Take 70 mg by mouth once a week. Fridays    . aspirin 325 MG tablet Take 325 mg daily by mouth.    Marland Kitchen atorvastatin (LIPITOR) 40 MG tablet Take 40 mg daily by mouth.    Marland Kitchen buPROPion (WELLBUTRIN XL) 150 MG 24 hr tablet Take 150 mg by mouth daily.     . calcium carbonate (OS-CAL) 600 MG TABS tablet Take 600 mg by mouth 2 (two) times daily with a meal.     . cetirizine (ZYRTEC) 10 MG tablet Take 10 mg by mouth daily.    . citalopram (CELEXA) 40 MG tablet Take 40 mg by mouth daily.     . clonazePAM (KLONOPIN) 0.5 MG tablet Take 0.5 mg by mouth daily. *may also take an additional 0.5mg  tab as needed for anxiety/agitation    . clopidogrel (PLAVIX) 75 MG tablet Take 75 mg daily by mouth.    . fluticasone (FLONASE) 50 MCG/ACT nasal spray Place 2 sprays daily into both nostrils.    . furosemide (LASIX) 40 MG tablet Take 40 mg by mouth.    Marland Kitchen guaiFENesin-dextromethorphan (ROBITUSSIN DM) 100-10 MG/5ML syrup Take by mouth.    . potassium chloride (K-DUR) 10 MEQ tablet Take 10 mEq by mouth daily.     . prochlorperazine (COMPAZINE) 10 MG tablet Take 10 mg every 6 (six) hours as needed by mouth for nausea or vomiting.    . risperiDONE (RISPERDAL) 3 MG tablet Take 3 mg by mouth 2 (two) times daily.    . traZODone (DESYREL) 50 MG tablet Take 0.5-1 tablets (25-50 mg total)  by mouth at bedtime as needed for sleep.    . Vitamin D, Ergocalciferol, (DRISDOL) 50000 units CAPS capsule Take 50,000 Units every 7 (seven) days by mouth.     No current facility-administered medications for this visit.      Allergies:   Patient has no known allergies.   Social History:  The patient  reports that he has been smoking cigarettes.  He has a 39.00 pack-year smoking history. he has never used smokeless tobacco. He reports that he does not drink alcohol or use drugs.   Family History:   family history includes Diabetes in his maternal grandmother. He was adopted.    Review of  Systems: Review of Systems  Unable to perform ROS: Mental acuity     PHYSICAL EXAM: VS:  BP 100/62 (BP Location: Right Arm, Patient Position: Sitting, Cuff Size: Normal)   Pulse 92   Wt 207 lb (93.9 kg)   BMI 34.45 kg/m  , BMI Body mass index is 34.45 kg/m. GEN: Well nourished, well developed, in no acute distress  HEENT: normal  Neck: no JVD, carotid bruits, or masses Cardiac: RRR; no murmurs, rubs, or gallops,no edema  Respiratory:  clear to auscultation bilaterally, normal work of breathing GI: soft, nontender, nondistended, + BS MS: no deformity or atrophy  Skin: warm and dry, no rash Neuro:  Strength and sensation are intact Psych: Minimally conversant    Recent Labs: 07/27/2017: ALT 17; BUN 14; Creatinine, Ser 0.96; Hemoglobin 14.2; Platelets 128; Potassium 4.1; Sodium 136    Lipid Panel Lab Results  Component Value Date   CHOL 138 05/15/2017   HDL 33 (L) 05/15/2017   LDLCALC 80 05/15/2017   TRIG 125 05/15/2017      Wt Readings from Last 3 Encounters:  07/31/17 207 lb (93.9 kg)  07/28/17 204 lb (92.5 kg)  05/20/17 212 lb 8 oz (96.4 kg)       ASSESSMENT AND PLAN:  Acute ischemic left MCA stroke (HCC) - Plan: EKG 12-Lead Difficult to assess his cognition on visit today as he was relatively nonverbal though would respond to select questions.  Caretaker did most of the talking and providing of history.  Question from Dr. love is concerning placement of loop monitor given recent stroke, etiology unclear.  Presumably unable to exclude arrhythmia such as atrial fibrillation He does have PFO Recommend we talk with Dr. Caryl Comes.  Uncertain if he would handle the procedure, may be better for him to have appointment with Dr. Caryl Comes for further discussion Will defer to Dr. Caryl Comes whether a TEE is warranted.  Uncertain if he would tolerate such or invasive procedure  PFO with atrial septal aneurysm - Plan: EKG 12-Lead Currently on aspirin Plavix  Recurrent major  depressive disorder, in partial remission ([place]) - Plan: EKG 12-Lead Managed by primary care  Down syndrome Per the notes, also with recent stroke, significant cognitive deficits   Disposition:  We will arrange appointment with Dr. Jolyn Nap for discussion of loop monitor placement given history of stroke   Total encounter time more than 60 minutes  Greater than 50% was spent in counseling and coordination of care with the patient  Patient was seen in consultation for Dr. Erling Cruz and will be referred back to her office for ongoing care of the issues detailed above   Orders Placed This Encounter  Procedures  . EKG 12-Lead     Signed, Esmond Plants, M.D., Ph.D. 07/31/2017  [place], [place]

## 2017-08-03 ENCOUNTER — Telehealth: Payer: Self-pay | Admitting: *Deleted

## 2017-08-03 DIAGNOSIS — Q2112 Patent foramen ovale: Secondary | ICD-10-CM

## 2017-08-03 DIAGNOSIS — Q211 Atrial septal defect: Secondary | ICD-10-CM

## 2017-08-03 NOTE — Telephone Encounter (Signed)
-----   Message from Minna Merritts, MD sent at 07/31/2017  7:38 PM EST ----- Need appointment with Dr. Caryl Comes to discuss possible loop monitor thx TG

## 2017-08-03 NOTE — Telephone Encounter (Signed)
Pt scheduled for 09/03/17 with Dr Caryl Comes   Nothing else needed.

## 2017-08-04 ENCOUNTER — Encounter: Payer: Medicare Other | Admitting: Speech Pathology

## 2017-08-04 ENCOUNTER — Ambulatory Visit: Payer: Medicare Other | Admitting: Occupational Therapy

## 2017-08-04 ENCOUNTER — Encounter: Payer: Self-pay | Admitting: Occupational Therapy

## 2017-08-04 ENCOUNTER — Encounter: Payer: Self-pay | Admitting: Physical Therapy

## 2017-08-04 ENCOUNTER — Ambulatory Visit: Payer: Medicare Other | Admitting: Physical Therapy

## 2017-08-04 DIAGNOSIS — R2681 Unsteadiness on feet: Secondary | ICD-10-CM

## 2017-08-04 DIAGNOSIS — M6281 Muscle weakness (generalized): Secondary | ICD-10-CM

## 2017-08-04 DIAGNOSIS — R278 Other lack of coordination: Secondary | ICD-10-CM

## 2017-08-04 NOTE — Therapy (Signed)
[place] MAIN [facility] SERVICES [address] [place], [place], [address] Phone: [phone]   Fax:  [phone]  Physical Therapy Treatment/Discharge Summary  Patient Details  Name: [place] MRN: [id number] Date of Birth: [birth year] Referring Provider: Bary Leriche   Encounter Date: 08/04/2017  PT End of Session - 08/04/17 1521    Visit Number  12    Number of Visits  25    Date for PT Re-Evaluation  08/20/17    Authorization Type  2/10    PT Start Time  1502    PT Stop Time  1530    PT Time Calculation (min)  28 min    Equipment Utilized During Treatment  Gait belt    Activity Tolerance  Patient tolerated treatment well;Patient limited by fatigue    Behavior During Therapy  [facility] for tasks assessed/performed       Past Medical History:  Diagnosis Date  . COPD (chronic obstructive pulmonary disease) ([place])   . Hepatitis C   . Hx of cholecystectomy   . Hypothyroidism   . Kidney cysts   . Major depressive disorder    Was treated at facility in [place] (D and G) years ago  . Tobacco abuse   . Vitamin D deficiency     Past Surgical History:  Procedure Laterality Date  . LAPAROSCOPIC CHOLECYSTECTOMY      There were no vitals filed for this visit.  Subjective Assessment - 08/04/17 1520    Subjective  Patient reports doing well; He reports that he is at his baseline from before the stroke with regards to walking. Patient denies any pain; denies any new falls;     Patient is accompained by:  -- Group home administrator     Pertinent History  Pt is a 56 yo w/Down syndrome w/CVA on 05/14/17 resulting in weakness of R UE and LE and mild weakness of L UE and LE.  Pt also had dysphagia prior to stroke and is own dyspagia 2 diet and thickened liquids due to risk of aspiration.  Pt has hx of     Limitations  Standing;Walking    How long can you sit comfortably?  unlimited    How long can you stand comfortably?  30 minutes or less before  needing to rest    How long can you walk comfortably?  10 minutes before needing to rest    Patient Stated Goals  "better balance"     Currently in Pain?  No/denies         [facility] PT Assessment - 08/04/17 0001      Standardized Balance Assessment   Five times sit to stand comments   15 sec without HHA (>10 sec indicates increased risk for falls for individuals <10 years old;     10 Meter Walk  1.17 m/s without AD (community ambulator, low fall risk)      Timed Up and Go Test   Normal TUG (seconds)  13.5    TUG Comments  without AD, low fall risk;          TREATMENT: Patient instructed in gait safety on even/uneven surfaces. He ambulated >500 feet outside on inclines, pavement, up/down curbs (x3) mod I exhibiting good foot clearance bilaterally and good speed. He does require cues for direction but this is related to history of downs syndrome.  Patient instructed in 10 meter walk, 5 times sit<>stand, Timed up and go to address goals.See above; He does require increased  cues for correct activity technique; Assessed LE strength: he exhibits 4/5 BLE gross strength   Patient reports that he is functioning at his baseline and is appropriate for discharge from PT at this time; Reinforced HEP;                  PT Education - 08/04/17 1521    Education provided  Yes    Education Details  LE strengthening, progress towards goals, gait safety;     Person(s) Educated  Patient    Methods  Explanation;Demonstration;Verbal cues    Comprehension  Verbalized understanding;Returned demonstration;Verbal cues required;Need further instruction       PT Short Term Goals - 08/04/17 1522      PT SHORT TERM GOAL #1   Title  Pt will demonstrate independence w/HEP in order to increase strength and balance to reduce overall risk of falls.    Baseline  continuation of progression    Time  6    Period  Weeks    Status  Achieved      PT SHORT TERM GOAL #2   Title  Pt will improve  decrease 5 time sit to stand to <17 seconds in order to demonstrate increased LE strength and balance.    Baseline  19 sec, 20/29: 15 seconds    Time  6    Period  Weeks    Status  Achieved      PT SHORT TERM GOAL #3   Title  Pt will improve gait speed measured by 10 m WT to 0.8 m/s to demonstrate increased LE strength and functional mobility in order to decrease risk of falls and increase participation in group home activities.    Baseline  0.71 m/s, 10/29: .78 m/s; 11/20: 1.17 m/s    Time  6    Period  Weeks    Status  Achieved      PT SHORT TERM GOAL #4   Title  Pt will improve TUG to < 15 seconds in order to demonstrate improved balance and functional activity tolerance.    Baseline  17 sec 10/29: 20 seconds; 11/20: 13.5 sec    Time  6    Period  Weeks    Status  Achieved      PT SHORT TERM GOAL #5   Title  Pt will improve Berg Balance score by at least 4 points in order to demonstrate improved dynamic and static, reducing overall falls risk.    Baseline  36/56 10/29: 46/56    Time  6    Period  Weeks    Status  Achieved        PT Long Term Goals - 08/04/17 1525      PT LONG TERM GOAL #1   Title  Pt will improve B LE strength to at least 4/5 in all limited planes in order to increase funtional activity tolerance and return to group home activities.     Baseline  gross strength assessment: 3/5; 10/29: 4/5    Time  12    Period  Weeks    Status  Achieved      PT LONG TERM GOAL #2   Title  Pt will improve 5 time sit to stand to < 15 seconds in order to demonstrate increase in LE strength and balance.    Baseline  11/20: 15 sec without HHA    Time  12    Period  Weeks    Status  Partially Met  PT LONG TERM GOAL #3   Title  Pt will incrase gait speed measured by 10 m WT to at least 1.0 m/s in order to demonstrate improved balance and LE strength and to decrease overall risk for falls.    Baseline  0.71 m/s 10/29: . 78 m/s    Time  12    Period  Weeks    Status   Achieved      PT LONG TERM GOAL #4   Title  Pt will improve TUG to < 13 seconds in order to demonstrate an increase in LE strength and improved dynamic balance.    Baseline  17 seconds 10/29: 20 seconds; 08/06/23: 13.5 sec    Time  12    Period  Weeks    Status  Partially Met      PT LONG TERM GOAL #5   Title  Pt will demonstrate an increase in Berg Balance Assessment by at least 10 points in order to demonstrate improved dynamic and static balance, reducing overall risk of falls.    Baseline  36/56 10/29: 46/56    Time  12    Period  Weeks    Status  Achieved            Plan - 08-05-2017 1535    Clinical Impression Statement  Patient is progressing well; He is able to exhibit good strength and mobility; He ambulates mod I on even and uneven surfaces with good foot clearance and safety; Patient denies any fatigue today; He has met most goals. He is still a little slower with timed up and go and sit<>Stand but this could be related to poor understanding of instruction. Patient is appropriate for discharge from PT at this time due to progress towards goals. He is agreeable. He states that he is at his baseline prior to stroke with regards to walking and mobility;     Rehab Potential  Good    Clinical Impairments Affecting Rehab Potential  transportation limitations, living situation    PT Frequency  2x / week    PT Duration  12 weeks    PT Treatment/Interventions  Cryotherapy;Moist Heat;Gait training;Therapeutic activities;Therapeutic exercise;Balance training;Neuromuscular re-education;Patient/family education;Manual techniques;Passive range of motion;Dry needling    PT Next Visit Plan  ambulation speed changes     PT Home Exercise Plan  review HEP, basketball pass on airex pad, walking football/basketball pass    Consulted and Agree with Plan of Care  Patient;Other (Comment)       Patient will benefit from skilled therapeutic intervention in order to improve the following deficits and  impairments:  Abnormal gait, Decreased activity tolerance, Decreased balance, Decreased coordination, Decreased endurance, Decreased mobility, Decreased safety awareness, Decreased strength, Difficulty walking, Decreased cognition  Visit Diagnosis: Muscle weakness (generalized)  Other lack of coordination  Unsteadiness on feet   G-Codes - August 05, 2017 1536    Functional Assessment Tool Used (Outpatient Only)  5x sit to stand, TUG, 63mWT, clinical judgement    Functional Limitation  Mobility: Walking and moving around    Mobility: Walking and Moving Around Goal Status [phone]  At least 1 percent but less than 20 percent impaired, limited or restricted    Mobility: Walking and Moving Around Discharge Status [phone]  At least 1 percent but less than 20 percent impaired, limited or restricted       Problem List Patient Active Problem List   Diagnosis Date Noted  . Major depressive disorder   . Hepatitis C antibody test positive  05/16/2017  . Acute ischemic left MCA stroke ([place])   . Down's syndrome   . PFO with atrial septal aneurysm     Kaelea Gathright PT, DPT 08/04/2017, 3:39 PM  [place] MAIN [facility] SERVICES [address] [place], [place], [address] Phone: [phone]   Fax:  [phone]  Name: [place] MRN: [id number] Date of Birth: [birth year]

## 2017-08-04 NOTE — Therapy (Signed)
[place] MAIN [facility] SERVICES [address] [place], [place], [address] Phone: [phone]   Fax:  [phone]  Occupational Therapy Treatment/Discharge Note  Patient Details  Name: [place] MRN: [id number] Date of Birth: Nov 11, 1960 Referring Provider: Dr. Erling Cruz   Encounter Date: 08/04/2017  OT End of Session - 08/04/17 1617    Visit Number  12    Number of Visits  24    Date for OT Re-Evaluation  08/28/17    Authorization Type  Medicare G code 2 of 10    OT Start Time  1600    OT Stop Time  1645    OT Time Calculation (min)  45 min    Activity Tolerance  Patient tolerated treatment well    Behavior During Therapy  [facility] for tasks assessed/performed       Past Medical History:  Diagnosis Date  . COPD (chronic obstructive pulmonary disease) ([place])   . Hepatitis C   . Hx of cholecystectomy   . Hypothyroidism   . Kidney cysts   . Major depressive disorder    Was treated at facility in [place] ([facility]) years ago  . Tobacco abuse   . Vitamin D deficiency     Past Surgical History:  Procedure Laterality Date  . LAPAROSCOPIC CHOLECYSTECTOMY      There were no vitals filed for this visit.  Subjective Assessment - 08/04/17 1616    Subjective   Pt. reports he is going to his sister's home for Thanksgiving.    Patient is accompained by:  Family member    Pertinent History  Pt. is a 56 y.o. 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 Pt. presents with residual UE, and LE weakness. Pt. was hospitalized, and transferred to inpatient Rehab at [place]. Pt. is on a Dysphagia 2 diet with thickened liquids. Pt. resides in a [place]. Pt. PMHx includes: Down's Syndrome, Major Depressive Disorder, Constipation, and COPD.         [facility] OT Assessment - 08/04/17 1626      Coordination   Right 9 Hole Peg Test  35    Left 9 Hole Peg Test  46      Hand Function   Right Hand Grip (lbs)  35    Right Hand Lateral Pinch  13 lbs    Right Hand 3 Point Pinch  9 lbs    Left Hand Grip (lbs)  40    Left Hand Lateral Pinch  13 lbs    Left 3 point pinch  11 lbs       OT TREATMENT    Pt. Goals were reviewed with pt.   BUE strength 4+/5  Therapeutic Exercise:  Pt. worked on the [employer] for 8 min. with constant monitoring of the BUEs. Pt. worked on changing, and alternating forward reverse position every 2 min. rest breaks were required. 3# dumbbell ex. for elbow flexion and extension,  2# for forearm supination/pronation, wrist flexion/extension, and radial deviation. Pt. requires rest breaks and verbal cues for proper technique.                     OT Education - 08/04/17 1616    Education provided  Yes    Education Details  UE strengthening    Person(s) Educated  Patient    Methods  Explanation;Demonstration;Verbal cues    Comprehension  Verbalized understanding;Returned demonstration;Verbal cues required;Need further instruction          OT Long Term Goals - 2017/08/20 1618      OT LONG TERM GOAL #1   Title  Pt. will improve bilateral UE strength by 2 mm grades to assist with ADLs/IADLs.    Baseline  Impaired UE strength    Time  12    Period  Weeks    Status  Achieved    Target Date  08/28/17      OT LONG TERM GOAL #2   Title  Pt. will improve bilateral grip strength to be able to hold aand use a vacuum    Baseline  Limited bilateral grip strength    Time  12    Period  Weeks    Status  Achieved    Target Date  08/28/17      OT LONG TERM GOAL #3   Title  Pt. will improve bilateral hand [facility] skills to assist with manipulating ADL objects.    Baseline  Limited bilateral [facility] skills    Time  12    Period  Weeks    Status  Achieved    Target Date  08/28/17      OT LONG TERM GOAL #4   Title  Pt. will improve bilateral pinch strength to be able to independently open containers    Baseline  Impaired  pinch strength    Time  12    Period  Weeks    Status  Achieved    Target Date  08/28/17      OT LONG TERM GOAL #5   Title  Pt. will improve bilateral eye hand coordination skills to be able to dribble a basketball.    Baseline  Impaired    Time  12    Period  Weeks    Status  Achieved    Target Date  08/28/17            Plan - 20-Aug-2017 1617    Clinical Impression Statement  Pt. has met his goals, and has improved with overall UE strength, and coordination skills for improved engagement in ADL, and IADL tasks. OT services are now discharged.     Rehab Potential  Good    OT Frequency  2x / week    OT Duration  12 weeks    OT Treatment/Interventions  Self-care/ADL training;Therapeutic exercise;Neuromuscular education;DME and/or AE instruction;Therapeutic activities;Therapeutic exercises    Consulted and Agree with Plan of Care  Patient       Patient will benefit from skilled therapeutic intervention in order to improve the following deficits and impairments:  Impaired UE functional use, Decreased knowledge of precautions, Decreased strength, Decreased range of motion, Decreased activity tolerance, Impaired perceived functional ability, Decreased coordination, Decreased knowledge of use of DME, Impaired vision/preception, Decreased cognition  Visit Diagnosis: Muscle weakness (generalized)  Other lack of coordination  G-Codes - 20-Aug-2017 1649    Functional Assessment Tool Used (Outpatient only)  Clinical Judgement based on pt. current functional level    Functional Limitation  Self care    Self Care Current Status (J6283)  At least 20 percent but less than 40 percent impaired, limited or restricted    Self Care Goal Status (M6294)  0 percent impaired, limited or restricted    Self Care Discharge Status [phone])  At least 1 percent but less than 20 percent impaired, limited or restricted       Problem List Patient Active Problem List  Diagnosis Date Noted  . Major  depressive disorder   . Hepatitis C antibody test positive 05/16/2017  . Acute ischemic left MCA stroke ([place])   . Down's syndrome   . PFO with atrial septal aneurysm     Harrel Carina, MS, OTR/L 08/04/2017, 4:50 PM  [place] MAIN [facility] SERVICES [address] [place], [place], [address] Phone: [phone]   Fax:  [phone]  Name: Ravin Denardo MRN: [id number] Date of Birth: 06-Nov-1960

## 2017-08-11 ENCOUNTER — Encounter: Payer: Medicare Other | Admitting: Occupational Therapy

## 2017-08-11 ENCOUNTER — Encounter: Payer: Medicare Other | Admitting: Speech Pathology

## 2017-08-11 ENCOUNTER — Ambulatory Visit: Payer: Medicare Other

## 2017-08-13 ENCOUNTER — Ambulatory Visit: Payer: Medicare Other | Admitting: Physical Therapy

## 2017-08-13 ENCOUNTER — Encounter: Payer: Medicare Other | Admitting: Speech Pathology

## 2017-08-13 ENCOUNTER — Encounter: Payer: Medicare Other | Admitting: Occupational Therapy

## 2017-08-17 ENCOUNTER — Ambulatory Visit (INDEPENDENT_AMBULATORY_CARE_PROVIDER_SITE_OTHER): Payer: Medicare Other | Admitting: Neurology

## 2017-08-17 ENCOUNTER — Encounter: Payer: Self-pay | Admitting: Neurology

## 2017-08-17 VITALS — BP 130/75 | HR 65 | Ht 63.0 in | Wt 205.2 lb

## 2017-08-17 DIAGNOSIS — I639 Cerebral infarction, unspecified: Secondary | ICD-10-CM

## 2017-08-17 NOTE — Patient Instructions (Signed)
I had a long d/w patient and his group home caregiver about his recent cryptogenic stroke, risk for recurrent stroke/TIAs, personally independently reviewed imaging studies and stroke evaluation results and answered questions.Continue aspirin 325 mg daily  for secondary stroke prevention and maintain strict control of hypertension with blood pressure goal below 130/90, diabetes with hemoglobin A1c goal below 6.5% and lipids with LDL cholesterol goal below 70 mg/dL. I also advised the patient to eat a healthy diet with plenty of whole grains, cereals, fruits and vegetables, exercise regularly and maintain ideal body weight No scheduled followup in the future with me is necessary but he may be referred back as needed.

## 2017-08-17 NOTE — Progress Notes (Signed)
[facility] [address] [place]. [place] [address] [phone]       OFFICE FOLLOW-UP NOTE  [name] Date of Birth:  1960-11-09 Medical Record Number:  [id number]   HPI: [name] is a 56 year old African-American male with Down syndrome who is seen today for first office follow-up visit following hospital admission for stroke in August 2018. He is accompanied by his caregiver from group home in [place] where he lives. History is obtained from them as well as review of electronic medical records. I have personally reviewed imaging films.[name] is a 56yo male with PMH significant for depression, Down's syndrome (currently living in a group home), COPD, and tobacco use who presents with aphasia since this morning at around 7am. History obtained from group home caregivers. Patient was in his usual state of health last night before he went to bed around 10pm. He was woken this morning and got up to get dressed. He came down to the kitchen and his caregiver noted that he was holding his underwear and a shirt in his hands, was not talking, and kept looking to his left side. No gait abnormalities. Caregivers state that he has not complained of any chest pain, palpitations, trouble breathing, fevers, or other symptoms recently. Patient has never had a stroke before. They called EMS and he was brought to [facility]. Head CT at [facility] demonstrated hypodensity in L MCA territory, consistent with infarct. He was transferred to [facility] for further evaluation and potential intervention. At baseline, patient is able to walk on his own, talk, dress himself, do some chores, but unable to complete activities of daily leaving, including cooking, cleaning, and needs some help with bathing. On interviewing the patient, he endorses that he is hungry, has trouble breathing, and his left shoulder hurts. Denies chest pain, abdominal pain, or headaches. Caregivers state that he did recognize who they were  and that his speech is still different than it has been. He does smoke ~1ppd, no alcohol use or other illicit drugs. His sister, Mario Liu, is his legal guardian. She lives in [place] and is on her way to [place]. He has never had a stroke before, but caregivers state that a few years ago, he did have a seizure that lasted a few minutes where his whole body was tremoring/shaking. He came out of the seizure on his own and was confused for a little but returned to baseline soon after. He has never had another seizure. Patient was not administered IV t-PA secondary to unclear time of onset. CT angiogram and perfusion were obtained which showed a small mismatch but given patient's poor baseline functioning he was not considered a candidate for endovascular treatment. MRI scan of the brain showed a large left MCA infarct with small petechial hemorrhagic transformation. 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 Lower extremity venous Dopplers were negative for DVT. Transthoracic echo showed normal ejection fraction. Bubble study was positive for small PFO/atrial septal defect. LDL cholesterol was elevated to 80 mg percent and hemoglobin A1c was 5.9. It was decided not to consider further workup with a TEE and loop recorder given his poor functional baseline he is not a candidate for PFO closure on anticoagulation. Patient was started on Lipitor and aspirin. He has done well. His speech is still impaired and slurred but the can be understood with some difficulty. He is able to return almost back to his previous baseline. He can swallow well but at times chokes if he eats too quickly. His  blood pressure seems to be well controlled and today it is 130775. Is tolerating Lipitor without muscle aches and pains. He has no complaints today.    ROS:   14 system review of systems is positive for    Slurred speech, memory difficulties, trouble swallowing and all other systems negative PMH:  Past Medical History:    Diagnosis Date  . COPD (chronic obstructive pulmonary disease) ([place])   . Hepatitis C   . Hx of cholecystectomy   . Hypothyroidism   . Kidney cysts   . Major depressive disorder    Was treated at facility in [place] ([facility]) years ago  . Tobacco abuse   . Vitamin D deficiency     Social History:  Social History   Socioeconomic History  . Marital status: Single    Spouse name: Not on file  . Number of children: Not on file  . Years of education: Not on file  . Highest education level: Not on file  Social Needs  . Financial resource strain: Not on file  . Food insecurity - worry: Not on file  . Food insecurity - inability: Not on file  . Transportation needs - medical: Not on file  . Transportation needs - non-medical: Not on file  Occupational History  . Not on file  Tobacco Use  . Smoking status: Current Every Day Smoker    Packs/day: 0.50    Years: 39.00    Pack years: 19.50    Types: Cigarettes  . Smokeless tobacco: Never Used  . Tobacco comment: pt is still smoking  Substance and Sexual Activity  . Alcohol use: No  . Drug use: No  . Sexual activity: Not on file  Other Topics Concern  . Not on file  Social History Narrative  . Not on file    Medications:   Current Outpatient Medications on File Prior to Visit  Medication Sig Dispense Refill  . albuterol (PROVENTIL HFA;VENTOLIN HFA) 108 (90 Base) MCG/ACT inhaler Inhale 2 puffs into the lungs every 6 (six) hours as needed for wheezing or shortness of breath.    Marland Kitchen alendronate (FOSAMAX) 70 MG tablet Take 70 mg by mouth once a week. Fridays    . aspirin 325 MG tablet Take 325 mg daily by mouth.    Marland Kitchen atorvastatin (LIPITOR) 40 MG tablet Take 40 mg daily by mouth.    Marland Kitchen buPROPion (WELLBUTRIN XL) 150 MG 24 hr tablet Take 150 mg by mouth daily.     . calcium carbonate (OS-CAL) 600 MG TABS tablet Take 600 mg by mouth 2 (two) times daily with a meal.     . cetirizine (ZYRTEC) 10 MG tablet Take 10 mg by mouth daily.     . citalopram (CELEXA) 40 MG tablet Take 40 mg by mouth daily.     . clonazePAM (KLONOPIN) 0.5 MG tablet Take by mouth.    . clopidogrel (PLAVIX) 75 MG tablet Take 75 mg daily by mouth.    . fluticasone (FLONASE) 50 MCG/ACT nasal spray Place 2 sprays daily into both nostrils.    Marland Kitchen guaifenesin (TUSSIN) 100 MG/5ML syrup Take 200 mg by mouth 3 (three) times daily as needed for cough.    Marland Kitchen guaiFENesin-dextromethorphan (ROBITUSSIN DM) 100-10 MG/5ML syrup Take by mouth.    . meloxicam (MOBIC) 15 MG tablet Take by mouth.    . potassium chloride (K-DUR) 10 MEQ tablet Take 10 mEq by mouth daily.     . prochlorperazine (COMPAZINE) 10 MG tablet Take  10 mg every 6 (six) hours as needed by mouth for nausea or vomiting.    . risperiDONE (RISPERDAL) 1 MG tablet Take by mouth.    . traZODone (DESYREL) 50 MG tablet Take 0.5-1 tablets (25-50 mg total) by mouth at bedtime as needed for sleep.    . Vitamin D, Ergocalciferol, (DRISDOL) 50000 units CAPS capsule Take 50,000 Units every 7 (seven) days by mouth.     No current facility-administered medications on file prior to visit.     Allergies:  No Known Allergies  Physical Exam General: Obese middle-aged African-American male, seated, in no evident distress Head: head normocephalic and atraumatic.  Neck: supple with no carotid or supraclavicular bruits Cardiovascular: regular rate and rhythm, no murmurs Musculoskeletal: no deformity Skin:  no rash/petichiae Vascular:  Normal pulses all extremities Vitals:   08/17/17 1116  BP: 130/75  Pulse: 65   Neurologic Exam Mental Status: Awake and fully alert. Diminished attention, registration and recall. Speech is slurred but can be understood with some difficulty. Can comprehend midline and one-step commands only. Attention span, concentration and fund of knowledge poor. Mood and affect appropriate.  Cranial Nerves: Fundoscopic exam reveals sharp disc margins. Pupils equal, briskly reactive to light.  Extraocular movements full without nystagmus. Visual fields full to confrontation. Hearing intact. Facial sensation intact. Face, tongue, palate moves normally and symmetrically.  Motor: Normal bulk and tone. Normal strength in all tested extremity muscles. Sensory.: intact to touch ,pinprick .position and vibratory sensation.  Coordination: Rapid alternating movements normal in all extremities. Finger-to-nose and heel-to-shin performed accurately bilaterally. Gait and Station: Arises from chair without difficulty. Stance is normal. Gait demonstrates normal stride length and balance . Able to heel, toe and tandem walk without difficulty.  Reflexes: 1+ and symmetric. Toes downgoing.   NIHSS  6 Modified Rankin  4   ASSESSMENT: 56 year old African-American male with Down syndrome with cryptogenic left MCA infarct in August 2018 who seems to be doing reasonably well. Vascular risk factors of hypertension and hyperlipidemia and mild obesity.    PLAN:  I had a long d/w patient and his group home caregiver about his recent cryptogenic stroke, risk for recurrent stroke/TIAs, personally independently reviewed imaging studies and stroke evaluation results and answered questions.given patient's Down syndrome and underlying cognitive impairment I do not believe further workup for cryptogenic stroke etiology is necessary. Continue aspirin 325 mg daily  for secondary stroke prevention and maintain strict control of hypertension with blood pressure goal below 130/90, diabetes with hemoglobin A1c goal below 6.5% and lipids with LDL cholesterol goal below 70 mg/dL. I also advised the patient to eat a healthy diet with plenty of whole grains, cereals, fruits and vegetables, exercise regularly and maintain ideal body weight No scheduled followup in the future with me is necessary but he may be referred back as needed. Greater than 50% of time during this 25 minute visit was spent on counseling,explanation of  diagnosis of stroke, cognitive impairment, planning of further management, discussion with patient and family and coordination of care Antony Contras, MD  [facility] [address] [place], [place] [address]  Phone [phone] Fax [phone] Note: This document was prepared with digital dictation and possible smart phrase technology. Any transcriptional errors that result from this process are unintentional

## 2017-08-18 ENCOUNTER — Encounter: Payer: Medicare Other | Admitting: Occupational Therapy

## 2017-08-20 ENCOUNTER — Encounter: Payer: Medicare Other | Admitting: Occupational Therapy

## 2017-08-25 ENCOUNTER — Encounter: Payer: Medicare Other | Admitting: Occupational Therapy

## 2017-08-27 ENCOUNTER — Encounter: Payer: Medicare Other | Admitting: Occupational Therapy

## 2017-09-01 ENCOUNTER — Encounter: Payer: Medicare Other | Admitting: Occupational Therapy

## 2017-09-03 ENCOUNTER — Encounter: Payer: Medicare Other | Admitting: Occupational Therapy

## 2017-09-03 ENCOUNTER — Ambulatory Visit: Payer: Medicare Other | Admitting: Internal Medicine

## 2017-09-04 ENCOUNTER — Encounter: Payer: Self-pay | Admitting: Internal Medicine

## 2017-09-11 ENCOUNTER — Encounter: Payer: Medicare Other | Admitting: Occupational Therapy

## 2017-09-18 ENCOUNTER — Telehealth: Payer: Self-pay | Admitting: Neurology

## 2017-09-18 NOTE — Telephone Encounter (Signed)
[facility] [phone] called said the pt went home over the holidays and when he returned family members advised he had some seizures. She said he was not taken to the hospital, she could not tell me if the patient was taking any seizure medications (she was not clinical personnel). RN was skyped and advised me to schedule appt with Dr Leonie Man, 1st available was 11/24/17, RN aware. FYI

## 2017-09-18 NOTE — Telephone Encounter (Signed)
Patient schedule its noted.

## 2017-09-23 NOTE — Telephone Encounter (Signed)
Okay but if she has any more seizures after her to let me know to adjust medications and to be seen sooner

## 2017-10-15 ENCOUNTER — Encounter: Payer: Self-pay | Admitting: Internal Medicine

## 2017-10-15 ENCOUNTER — Ambulatory Visit (INDEPENDENT_AMBULATORY_CARE_PROVIDER_SITE_OTHER): Payer: Medicare Other | Admitting: Internal Medicine

## 2017-10-15 VITALS — BP 108/78 | HR 76 | Ht 64.0 in | Wt 210.2 lb

## 2017-10-15 DIAGNOSIS — I639 Cerebral infarction, unspecified: Secondary | ICD-10-CM | POA: Diagnosis not present

## 2017-10-15 NOTE — Progress Notes (Signed)
ELECTROPHYSIOLOGY CONSULT NOTE  Patient ID: Mario Liu, MRN: [id number], DOB/AGE: [birth year] 57 y.o. Admit date: (Not on file) Date of Consult: 10/15/2017  Primary Physician: [name], FNP Primary Cardiologist: Oliver Hum [name] is a 57 y.o. male who is being seen today for the evaluation of genetic stroke at the request of Dr. Deidre Ala.    HPI Mario Liu is a 57 y.o. male with a stroke August 2018 manifesting as right-sided weakness difficulty speaking.  MRI confirmed a left MCA infarct now referred because of for consideration of a loop recorder.  He has a complex neuropsychiatric history including Down syndrome, major depressive disorder, perhaps schizophrenia.  He is at baseline verbal.  Evaluation at that time included a echo with a positive bubble study as well as an atrial septal aneurysm.  Venous Dopplers were negative.  Treated with aspirin 325 and this was confirmed by most recent note from neurology 12/18  Past Medical History:  Diagnosis Date  . COPD (chronic obstructive pulmonary disease) ([place])   . Hepatitis C   . Hx of cholecystectomy   . Hypothyroidism   . Kidney cysts   . Major depressive disorder    Was treated at facility in [place] ([facility]) years ago  . Stroke ([place])   . Tobacco abuse   . Vitamin D deficiency       Surgical History:  Past Surgical History:  Procedure Laterality Date  . LAPAROSCOPIC CHOLECYSTECTOMY       Home Meds: Prior to Admission medications   Medication Sig Start Date End Date Taking? Authorizing Provider  albuterol (PROVENTIL HFA;VENTOLIN HFA) 108 (90 Base) MCG/ACT inhaler Inhale 2 puffs into the lungs every 6 (six) hours as needed for wheezing or shortness of breath.   Yes [provider]  alendronate (FOSAMAX) 70 MG tablet Take 70 mg by mouth once a week. Fridays   Yes [provider]  aspirin 325 MG tablet Take 325 mg daily by mouth.   Yes [provider]  atorvastatin (LIPITOR) 40 MG  tablet Take 40 mg daily by mouth.   Yes [provider]  buPROPion (WELLBUTRIN XL) 150 MG 24 hr tablet Take 150 mg by mouth daily.    Yes [provider]  calcium carbonate (OS-CAL) 600 MG TABS tablet Take 600 mg by mouth 2 (two) times daily with a meal.    Yes [provider]  citalopram (CELEXA) 40 MG tablet Take 40 mg by mouth daily.    Yes [provider]  clonazePAM (KLONOPIN) 0.5 MG tablet Take by mouth.   Yes [provider]  clopidogrel (PLAVIX) 75 MG tablet Take 75 mg daily by mouth.   Yes [provider]  fluticasone (FLONASE) 50 MCG/ACT nasal spray Place 2 sprays daily into both nostrils.   Yes [provider]  guaifenesin (TUSSIN) 100 MG/5ML syrup Take 200 mg by mouth 3 (three) times daily as needed for cough.   Yes [provider]  guaiFENesin-dextromethorphan (ROBITUSSIN DM) 100-10 MG/5ML syrup Take by mouth.   Yes [provider]  loratadine (CLARITIN) 10 MG tablet Take 10 mg by mouth daily.   Yes [provider]  polyethylene glycol (MIRALAX / GLYCOLAX) packet Take 17 g by mouth daily as needed.   Yes [provider]  potassium chloride (K-DUR) 10 MEQ tablet Take 10 mEq by mouth daily.    Yes [provider]  risperiDONE (RISPERDAL) 3 MG tablet Take 3 mg  by mouth 2 (two) times daily.   Yes [provider]  traZODone (DESYREL) 50 MG tablet Take 0.5-1 tablets (25-50 mg total) by mouth at bedtime as needed for sleep. 05/25/17  Yes Love, Ivan Anchors, PA-C    Allergies: No Known Allergies  Social History   Socioeconomic History  . Marital status: Single    Spouse name: Not on file  . Number of children: Not on file  . Years of education: Not on file  . Highest education level: Not on file  Social Needs  . Financial resource strain: Not on file  . Food insecurity - worry: Not on file  . Food insecurity - inability: Not on file  . Transportation needs - medical: Not  on file  . Transportation needs - non-medical: Not on file  Occupational History  . Not on file  Tobacco Use  . Smoking status: Current Every Day Smoker    Packs/day: 0.50    Years: 39.00    Pack years: 19.50    Types: Cigarettes  . Smokeless tobacco: Never Used  . Tobacco comment: pt is still smoking  Substance and Sexual Activity  . Alcohol use: No  . Drug use: No  . Sexual activity: Not on file  Other Topics Concern  . Not on file  Social History Narrative  . Not on file     Family History  Adopted: Yes  Problem Relation Age of Onset  . Diabetes Maternal Grandmother      ROS:  Please see the history of present illness.    Other systems unable to be reviewed because of verbal limitations   Physical Exam:  Blood pressure 108/78, pulse 76, height 5\' 4"  (1.626 m), weight 210 lb 4 oz (95.4 kg). General: Well developed, well nourished male in no acute distress. Head: Normocephalic, atraumatic, abnormal facies EENT: normal  Lymph Nodes:  none Neck: Negative for carotid bruits. JVD not elevated. Back:without scoliosis kyphosis  Lungs: Clear bilaterally to auscultation without wheezes, rales, or rhonchi. Breathing is unlabored. Heart: RRR with S1 S2. No  * murmur . No rubs, or gallops appreciated. Abdomen: Soft, non-tender, non-distended with normoactive bowel sounds. No hepatomegaly. No rebound/guarding. No obvious abdominal masses. Msk:  Strength and tone appear normal for age. Extremities: No clubbing or cyanosis. No  edema.  Distal pedal pulses are 2+ and equal bilaterally. Skin: Warm and Dry Neuro: Alert and oriented X 3. CN III-XII intact Grossly normal sensory and motor function . Psych:  Responds to questions appropriately with a normal affect.      Labs: Cardiac Enzymes No results for input(s): CKTOTAL, CKMB, TROPONINI in the last 72 hours. CBC Lab Results  Component Value Date   WBC 5.3 07/27/2017   HGB 14.2 07/27/2017   HCT 43.5 07/27/2017   MCV 86.6  07/27/2017   PLT 128 (L) 07/27/2017   PROTIME: No results for input(s): LABPROT, INR in the last 72 hours. Chemistry No results for input(s): NA, K, CL, CO2, BUN, CREATININE, CALCIUM, PROT, BILITOT, ALKPHOS, ALT, AST, GLUCOSE in the last 168 hours.  Invalid input(s): LABALBU Lipids Lab Results  Component Value Date   CHOL 138 05/15/2017   HDL 33 (L) 05/15/2017   LDLCALC 80 05/15/2017   TRIG 125 05/15/2017   BNP No results found for: PROBNP Thyroid Function Tests: No results for input(s): TSH, T4TOTAL, T3FREE, THYROIDAB in the last 72 hours.  Invalid input(s): FREET3 Miscellaneous No results found for: DDIMER  Radiology/Studies:  No results found.  EKG:  Sinus at 76 Interval 16/09/38   Assessment and Plan:  Cryptogenic Stroke LMCA   PFO  Mental retardation/Schizophrenia--Chart >> Downs syndrome  Seizures   Reading Neuro notes "further workup for cryptogenic stroke etiology is necessary"  But not sure that that is the best answer  Will try and review with Dr [facility] the new data on PFO closure's longer term benefits  I would defer the issue of LINQ to this conversation     Virl Axe

## 2017-10-15 NOTE — Patient Instructions (Signed)
Medication Instructions: - Your physician recommends that you continue on your current medications as directed. Please refer to the Current Medication list given to you today.  Labwork: - none ordered  Procedures/Testing: - none ordered  Follow-Up: - pending discussion with Dr. Burt Knack regarding PFO.  Any Additional Special Instructions Will Be Listed Below (If Applicable).     If you need a refill on your cardiac medications before your next appointment, please call your pharmacy.

## 2017-10-26 ENCOUNTER — Other Ambulatory Visit: Payer: Medicare Other

## 2017-10-28 ENCOUNTER — Ambulatory Visit: Payer: Medicare Other | Admitting: Oncology

## 2017-11-03 ENCOUNTER — Other Ambulatory Visit: Payer: Self-pay | Admitting: Oncology

## 2017-11-03 DIAGNOSIS — D696 Thrombocytopenia, unspecified: Secondary | ICD-10-CM

## 2017-11-03 NOTE — Progress Notes (Signed)
[facility] follow up Visit:  Patient Care Team: [name], FNP as PCP - General (Family Medicine)  CHIEF COMPLAINTS/PURPOSE OF CONSULTATION: Low platelets  HISTORY OF PRESENTING ILLNESS: Mario Liu 57 y.o. male is here for evaluation of low platelet count. The patient lives in a group home and is accompanied by caregiver from group home. He is referred to [place] for evaluation of thrombocytopenia. He had labs on with primary care provider on December 15 2016 which showed WBC 5.3 hemoglobin 14.3 MCV 88 RDW 13.8 MPV 11.37 platelet 111 lymphocyte 46.69% absolute lymphocytes 2.5 normal is BMPnormal LFT. It was noted that hepatitis C antibody reactive. Patient was seen by [facility] for workupof her positive hepatitis C antibody Further tests including HCV RNA by PCR was negative.  Hep B core antibody negative, Hb surface antibody nonreactive,A antibody positive,Hep A IgM negative. So that patient did not really have hepatitis C, additional ultrasound and fibrocystic was canceled by GI.Marland Kitchen Patient reports feeling well, good energy level, good appetite, denies any weight loss fatigue, fever or chills. Patient is a poor historian. He told me that he used to drink liquor but haven't drink lately. INTERVAL HISTORY Patient presents to clinic for follow-up of thrombocytopenia. In September 2018, patient had a stroke, acute ischemic left MCA stroke. His in getting physical therapy. Denies any easy bruising or bleeding. He appears to be less duct if compared to prior to the stroke. Able to have simple conversation with me.Denies any recent infection, bleeding, surgery. He has some dysphagia and speech intelligibility which slowly improving with the physical therapy and speech therapy according to the group home staff.  Review of Systems  Constitutional: Negative for appetite change, chills, fatigue and unexpected weight change.  HENT:   Negative for lump/mass and nosebleeds.    Eyes: Negative for eye problems.  Respiratory: Negative for chest tightness.   Cardiovascular: Negative for chest pain and leg swelling.  Gastrointestinal: Negative for abdominal distention, abdominal pain and blood in stool.  Endocrine: Negative for hot flashes.  Genitourinary: Negative for difficulty urinating, dysuria and hematuria.   Musculoskeletal: Negative for arthralgias and back pain.  Skin: Negative for itching.  Neurological: Negative for dizziness and light-headedness.       Stroke last year.  Hematological: Negative.   Psychiatric/Behavioral: Negative.  Negative for confusion. The patient is not nervous/anxious.     MEDICAL HISTORY: Past Medical History:  Diagnosis Date  . COPD (chronic obstructive pulmonary disease) ([place])   . Hepatitis C   . Hx of cholecystectomy   . Hypothyroidism   . Kidney cysts   . Major depressive disorder    Was treated at facility in [place] (D and G) years ago  . Stroke ([place])   . Tobacco abuse   . Vitamin D deficiency     SURGICAL HISTORY: Past Surgical History:  Procedure Laterality Date  . LAPAROSCOPIC CHOLECYSTECTOMY      SOCIAL HISTORY: Social History   Socioeconomic History  . Marital status: Single    Spouse name: Not on file  . Number of children: Not on file  . Years of education: Not on file  . Highest education level: Not on file  Social Needs  . Financial resource strain: Not on file  . Food insecurity - worry: Not on file  . Food insecurity - inability: Not on file  . Transportation needs - medical: Not on file  . Transportation needs - non-medical: Not on file  Occupational History  .  Not on file  Tobacco Use  . Smoking status: Current Every Day Smoker    Packs/day: 0.50    Years: 39.00    Pack years: 19.50    Types: Cigarettes  . Smokeless tobacco: Never Used  . Tobacco comment: pt is still smoking  Substance and Sexual Activity  . Alcohol use: No  . Drug use: No  . Sexual activity: Not on file   Other Topics Concern  . Not on file  Social History Narrative  . Not on file    FAMILY HISTORY Family History  Adopted: Yes  Problem Relation Age of Onset  . Diabetes Maternal Grandmother     ALLERGIES:  has No Known Allergies.  MEDICATIONS:  Current Outpatient Medications  Medication Sig Dispense Refill  . albuterol (PROVENTIL HFA;VENTOLIN HFA) 108 (90 Base) MCG/ACT inhaler Inhale 2 puffs into the lungs every 6 (six) hours as needed for wheezing or shortness of breath.    Marland Kitchen alendronate (FOSAMAX) 70 MG tablet Take 70 mg by mouth once a week. Fridays    . aspirin 325 MG tablet Take 325 mg daily by mouth.    Marland Kitchen atorvastatin (LIPITOR) 40 MG tablet Take 40 mg daily by mouth.    Marland Kitchen buPROPion (WELLBUTRIN XL) 150 MG 24 hr tablet Take 150 mg by mouth daily.     . calcium carbonate (OS-CAL) 600 MG TABS tablet Take 600 mg by mouth 2 (two) times daily with a meal.     . citalopram (CELEXA) 40 MG tablet Take 40 mg by mouth daily.     . clonazePAM (KLONOPIN) 0.5 MG tablet Take by mouth.    . clopidogrel (PLAVIX) 75 MG tablet Take 75 mg daily by mouth.    . fluticasone (FLONASE) 50 MCG/ACT nasal spray Place 2 sprays daily into both nostrils.    Marland Kitchen guaifenesin (TUSSIN) 100 MG/5ML syrup Take 200 mg by mouth 3 (three) times daily as needed for cough.    Marland Kitchen guaiFENesin-dextromethorphan (ROBITUSSIN DM) 100-10 MG/5ML syrup Take by mouth.    . loratadine (CLARITIN) 10 MG tablet Take 10 mg by mouth daily.    . polyethylene glycol (MIRALAX / GLYCOLAX) packet Take 17 g by mouth daily as needed.    . potassium chloride (K-DUR) 10 MEQ tablet Take 10 mEq by mouth daily.     . risperiDONE (RISPERDAL) 3 MG tablet Take 3 mg by mouth 2 (two) times daily.    . traZODone (DESYREL) 50 MG tablet Take 0.5-1 tablets (25-50 mg total) by mouth at bedtime as needed for sleep.     No current facility-administered medications for this visit.     PHYSICAL EXAMINATION:  ECOG PERFORMANCE STATUS: 1 - Symptomatic but  completely ambulatory   There were no vitals filed for this visit.  Filed Weights   11/04/17 1113  Weight: 207 lb 6.4 oz (94.1 kg)     Physical Exam  Constitutional: He is oriented to person, place, and time and well-developed, well-nourished, and in no distress. No distress.  HENT:  Head: Normocephalic and atraumatic.  Mouth/Throat: No oropharyngeal exudate.  Eyes: Conjunctivae and EOM are normal. Pupils are equal, round, and reactive to light.  Neck: Normal range of motion. Neck supple.  Cardiovascular: Normal rate and regular rhythm.  No murmur heard. Pulmonary/Chest: Effort normal and breath sounds normal. No respiratory distress.  Abdominal: Soft. Bowel sounds are normal. He exhibits no distension.  Musculoskeletal: Normal range of motion. He exhibits no edema.  Lymphadenopathy:    He has no  cervical adenopathy.  Neurological: He is alert and oriented to person, place, and time. No cranial nerve deficit.  Skin: Skin is warm and dry. No erythema.       LABORATORY DATA: I have personally reviewed the data as listed: He had labs on with primary care provider on December 15 2016 which showed WBC 5.3 hemoglobin 14.3 MCV 88 RDW 13.8 MPV 11.37 platelet 111 lymphocyte 46.69% absolute lymphocytes 2.5 normal is BMPnormal LFT. It was noted that hepatitis C antibody reactive. Patient was seen by [facility] for workupof her positive hepatitis C antibody Further tests including HCV RNA by PCR was negative.  Hep B core antibody negative, Hb surface antibody nonreactive,A antibody positive,Hep A IgM negative. CBC    Component Value Date/Time   WBC 5.3 07/27/2017 1135   RBC 5.02 07/27/2017 1135   HGB 14.2 07/27/2017 1135   HCT 43.5 07/27/2017 1135   PLT 128 (L) 07/27/2017 1135   MCV 86.6 07/27/2017 1135   MCH 28.3 07/27/2017 1135   MCHC 32.7 07/27/2017 1135   RDW 13.8 07/27/2017 1135   LYMPHSABS 2.3 07/27/2017 1135   MONOABS 0.5 07/27/2017 1135   EOSABS 0.3 07/27/2017  1135   BASOSABS 0.0 07/27/2017 1135   CMP Latest Ref Rng & Units 07/27/2017 05/21/2017 05/19/2017  Glucose 65 - 99 mg/dL 120(H) 133(H) 171(H)  BUN 6 - 20 mg/dL 14 10 12   Creatinine 0.61 - 1.24 mg/dL 0.96 0.93 1.02  Sodium 135 - 145 mmol/L 136 135 136  Potassium 3.5 - 5.1 mmol/L 4.1 3.9 3.9  Chloride 101 - 111 mmol/L 102 104 107  CO2 22 - 32 mmol/L 26 23 19(L)  Calcium 8.9 - 10.3 mg/dL 9.5 8.4(L) 8.7(L)  Total Protein 6.5 - 8.1 g/dL 7.8 - 7.7  Total Bilirubin 0.3 - 1.2 mg/dL 0.6 - 1.0  Alkaline Phos 38 - 126 U/L 50 - 37(L)  AST 15 - 41 U/L 22 - 29  ALT 17 - 63 U/L 17 - 24  Results for NADIA, TORR (MRN [id number]) as of 04/27/2017 11:09  Ref. Range 04/20/2017 12:10 04/20/2017 12:21  Folate Latest Ref Range: >5.9 ng/mL  11.0  Vitamin B12 Latest Ref Range: 180 - 914 pg/mL 392    Results for KASTIEL, SIMONIAN (MRN [id number]) as of 04/27/2017 11:09  Ref. Range 04/20/2017 12:21  LDH Latest Ref Range: 98 - 192 U/L 185   Peripheral lab flow cytometry No monoclonal B cell population is detected. kappa:lambda ratio 1.8  There is no loss of, or aberrant expression of, the pan T cell  antigens to  suggest a neoplastic T cell process.  CD4:CD8 ratio 2.5  No circulating blasts are detected. There is no immunophenotypic  evidence  of abnormal myeloid maturation.  Analysis of the leukocyte population shows: granulocytes 50%,  monocytes  10%, lymphocytes 40%, blasts <0.5%, B cells 8%, T cells 30%, NK cells  2%.    RADIOGRAPHIC STUDIES: I have personally reviewed the radiological images as listed and agree with the findings in the report US abdomen 04/23/2017 FINDINGS: Gallbladder: Surgically absent.Common bile duct: Diameter: 3 mm. No intrahepatic, common hepatic, common bile duct dilatation.Liver: No focal lesion identified. Within normal limits inparench ymal echogenicity. IVC: No abnormality visualized in areas which can be interrogated.Portions of the infrahepatic inferior vena cava are obscured by  gas. Pancreas: Pancreas essentially completely obscured by gas. Spleen: Size and appearance within normal limits. Spleen measures 9.1 x 10.0 x 3.9 cm with a measured splenic volume of 186 cubic  cm. Right Kidney: Length: 10.8 cm. Echogenicity within normal limits. No hydronephrosis visualized. There is a mildly complex cystic structure in the lower pole the right kidney measuring 4.4 x 2.3 x 2.8 cm, similar to prior study. There is a nearby second mildly complex cystic structure toward the mid right kidney measuring 1.4 x1.4 x 1.7 cm, essentially stable from prior study. There is a cyst in the midportion of the kidney measuring 2.7 x 2.4 x 2.7 cm. No newrenal lesions evident. Left Kidney: Length: 11.5 cm. Echogenicity within normal limits. No hydronephrosis visualized. There is a simple cyst in the mid left kidney measuring 0.9 x 1.0 x 1.1 cm. Abdominal aorta: No aneurysm visualized in areas which can be interrogated. Portions of the aorta are obscured by gas. Other findings: No demonstrable ascites.  IMPRESSION:1.  No liver or splenic lesions.2.  Gallbladder absent.3. Stable mildly complex cystic areas in the right kidney. No new renal lesion identified on the right. Small simple cyst mid left kidney.4. Pancreas essentially completely obscured by gas. Portions of inferior vena cava and aorta obscured by gas. Visualized portions ofthese structures appear unremarkable.  ASSESSMENT/PLAN 1. Thrombocytopenia ([place])   2. Normocytic anemia   3. History of alcohol consumption    # Risperidone can potentially cause thrombocytopenia. Platelet counts has been stable for the past 3+ months.  Given that he has stable counts, will postpone invasive bone marrow evaluation. Continue monitoring his counts.  Continue Aspirin and plavix.  Repeat cbc in 6 months and follow up in clinic.   All questions were answered. The patient knows to call the clinic with any problems, questions or concerns.   Earlie Server,  MD, PhD Hematology Oncology [facility] at [facility] Pager- [phone] 11/04/2017

## 2017-11-04 ENCOUNTER — Other Ambulatory Visit: Payer: Self-pay

## 2017-11-04 ENCOUNTER — Encounter: Payer: Self-pay | Admitting: Oncology

## 2017-11-04 ENCOUNTER — Inpatient Hospital Stay: Payer: Medicare Other | Attending: Oncology

## 2017-11-04 ENCOUNTER — Inpatient Hospital Stay (HOSPITAL_BASED_OUTPATIENT_CLINIC_OR_DEPARTMENT_OTHER): Payer: Medicare Other | Admitting: Oncology

## 2017-11-04 VITALS — BP 107/74 | HR 98 | Temp 98.1°F | Resp 12 | Ht 64.0 in | Wt 207.4 lb

## 2017-11-04 DIAGNOSIS — Z7982 Long term (current) use of aspirin: Secondary | ICD-10-CM | POA: Insufficient documentation

## 2017-11-04 DIAGNOSIS — B1920 Unspecified viral hepatitis C without hepatic coma: Secondary | ICD-10-CM

## 2017-11-04 DIAGNOSIS — F17210 Nicotine dependence, cigarettes, uncomplicated: Secondary | ICD-10-CM

## 2017-11-04 DIAGNOSIS — J449 Chronic obstructive pulmonary disease, unspecified: Secondary | ICD-10-CM | POA: Insufficient documentation

## 2017-11-04 DIAGNOSIS — Z7902 Long term (current) use of antithrombotics/antiplatelets: Secondary | ICD-10-CM | POA: Diagnosis not present

## 2017-11-04 DIAGNOSIS — Z8673 Personal history of transient ischemic attack (TIA), and cerebral infarction without residual deficits: Secondary | ICD-10-CM | POA: Insufficient documentation

## 2017-11-04 DIAGNOSIS — E039 Hypothyroidism, unspecified: Secondary | ICD-10-CM

## 2017-11-04 DIAGNOSIS — Z79899 Other long term (current) drug therapy: Secondary | ICD-10-CM | POA: Insufficient documentation

## 2017-11-04 DIAGNOSIS — D696 Thrombocytopenia, unspecified: Secondary | ICD-10-CM | POA: Insufficient documentation

## 2017-11-04 DIAGNOSIS — Z87898 Personal history of other specified conditions: Secondary | ICD-10-CM

## 2017-11-04 DIAGNOSIS — D649 Anemia, unspecified: Secondary | ICD-10-CM | POA: Diagnosis not present

## 2017-11-04 LAB — CBC WITH DIFFERENTIAL/PLATELET
BASOS PCT: 1 %
Basophils Absolute: 0 10*3/uL (ref 0–0.1)
EOS PCT: 4 %
Eosinophils Absolute: 0.2 10*3/uL (ref 0–0.7)
HCT: 40.1 % (ref 40.0–52.0)
Hemoglobin: 13.3 g/dL (ref 13.0–18.0)
LYMPHS ABS: 1.9 10*3/uL (ref 1.0–3.6)
Lymphocytes Relative: 38 %
MCH: 28.5 pg (ref 26.0–34.0)
MCHC: 33.1 g/dL (ref 32.0–36.0)
MCV: 86 fL (ref 80.0–100.0)
Monocytes Absolute: 0.8 10*3/uL (ref 0.2–1.0)
Monocytes Relative: 15 %
NEUTROS PCT: 42 %
Neutro Abs: 2.2 10*3/uL (ref 1.4–6.5)
PLATELETS: 126 10*3/uL — AB (ref 150–440)
RBC: 4.66 MIL/uL (ref 4.40–5.90)
RDW: 13.7 % (ref 11.5–14.5)
WBC: 5.2 10*3/uL (ref 3.8–10.6)

## 2017-11-04 NOTE — Progress Notes (Signed)
Patient here for follow up.No changes since his last appointment. 

## 2017-11-12 ENCOUNTER — Encounter: Payer: Self-pay | Admitting: Internal Medicine

## 2017-11-12 NOTE — Progress Notes (Signed)
Spoke with Dr. Burt Knack who reviewed the echocardiogram.  PFO appears to be really quite small based on the bubble study.  He would recommend not pursuing PFO closure.  I will review this with the family.

## 2017-11-24 ENCOUNTER — Encounter: Payer: Self-pay | Admitting: Neurology

## 2017-11-24 ENCOUNTER — Ambulatory Visit (INDEPENDENT_AMBULATORY_CARE_PROVIDER_SITE_OTHER): Payer: Medicare Other | Admitting: Neurology

## 2017-11-24 VITALS — BP 98/74 | HR 85 | Wt 206.4 lb

## 2017-11-24 DIAGNOSIS — G40909 Epilepsy, unspecified, not intractable, without status epilepticus: Secondary | ICD-10-CM | POA: Insufficient documentation

## 2017-11-24 DIAGNOSIS — I639 Cerebral infarction, unspecified: Secondary | ICD-10-CM | POA: Diagnosis not present

## 2017-11-24 HISTORY — DX: Epilepsy, unspecified, not intractable, without status epilepticus: G40.909

## 2017-11-24 MED ORDER — LACOSAMIDE 50 MG PO TABS: 200.0000 mg | ORAL_TABLET | Freq: Two times a day (BID) | ORAL | Status: DC

## 2017-11-24 NOTE — Progress Notes (Signed)
[facility] [address] [place]. [place] [address] [phone]       OFFICE FOLLOW-UP NOTE  Mario. Mario Liu Date of Birth:  Sep 25, 1960 Medical Record Number:  [id number]   HPI:  Initial visit 08/17/17 Mario Liu is a 57 year old African-American male with Down syndrome who is seen today for first office follow-up visit following hospital admission for stroke in August 2018. He is accompanied by his caregiver from group home in [place] where he lives. History is obtained from them as well as review of electronic medical records. I have personally reviewed imaging films.Mario Liu is a 57yo male with PMH significant for depression, Down's syndrome (currently living in a group home), COPD, and tobacco use who presents with aphasia since this morning at around 7am. History obtained from group home caregivers. Patient was in his usual state of health last night before he went to bed around 10pm. He was woken this morning and got up to get dressed. He came down to the kitchen and his caregiver noted that he was holding his underwear and a shirt in his hands, was not talking, and kept looking to his left side. No gait abnormalities. Caregivers state that he has not complained of any chest pain, palpitations, trouble breathing, fevers, or other symptoms recently. Patient has never had a stroke before. They called EMS and he was brought to [facility]. Head CT at [facility] demonstrated hypodensity in L MCA territory, consistent with infarct. He was transferred to [facility] for further evaluation and potential intervention. At baseline, patient is able to walk on his own, talk, dress himself, do some chores, but unable to complete activities of daily leaving, including cooking, cleaning, and needs some help with bathing. On interviewing the patient, he endorses that he is hungry, has trouble breathing, and his left shoulder hurts. Denies chest pain, abdominal pain, or headaches. Caregivers state that he did  recognize who they were and that his speech is still different than it has been. He does smoke ~1ppd, no alcohol use or other illicit drugs. His sister, Mario Liu, is his legal guardian. She lives in [place] and is on her way to [place]. He has never had a stroke before, but caregivers state that a few years ago, he did have a seizure that lasted a few minutes where his whole body was tremoring/shaking. He came out of the seizure on his own and was confused for a little but returned to baseline soon after. He has never had another seizure. Patient was not administered IV t-PA secondary to unclear time of onset. CT angiogram and perfusion were obtained which showed a small mismatch but given patient's poor baseline functioning he was not considered a candidate for endovascular treatment. MRI scan of the brain showed a large left MCA infarct with small petechial hemorrhagic transformation. 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 Lower extremity venous Dopplers were negative for DVT. Transthoracic echo showed normal ejection fraction. Bubble study was positive for small PFO/atrial septal defect. LDL cholesterol was elevated to 80 mg percent and hemoglobin A1c was 5.9. It was decided not to consider further workup with a TEE and loop recorder given his poor functional baseline he is not a candidate for PFO closure on anticoagulation. Patient was started on Lipitor and aspirin. He has done well. His speech is still impaired and slurred but the can be understood with some difficulty. He is able to return almost back to his previous baseline. He can swallow well but at times chokes if he  eats too quickly. His blood pressure seems to be well controlled and today it is [id number]. Is tolerating Lipitor without muscle aches and pains. He has no complaints today. Update 11/24/2017 : Patient is seen today for follow-up after his last visit 3 months ago. He is accompanied by his caregiver from the group home. Patient is  unable to provide history. I have reviewed notes which accompany him and also I  called and spoke to a nurse at the group home where he stays. Patient apparently was visiting family during Christmas time and had a bunch of seizures. Detailed description is not available. Patient was initially started on Keppra but for some reason was switched to Vimpat. He is currently on 200 mg twice daily and apparently tolerating it well and has had no witnessed seizures in the group home. He is otherwise in good health with no neurological changes. His tolerating aspirin without any bruising or bleeding. His blood pressure seems well controlled. He has not had any recurrent stroke or TIA symptoms. Patient has not had any brain imaging or EEG study done for seizures.  ROS:   14 system review of systems is positive for    Slurred speech, memory difficulties, seizures, hearing loss, frequency of urination and all other systems negative PMH:  Past Medical History:  Diagnosis Date  . COPD (chronic obstructive pulmonary disease) ([place])   . Hepatitis C   . Hx of cholecystectomy   . Hypothyroidism   . Kidney cysts   . Major depressive disorder    Was treated at facility in [place] (D and G) years ago  . Stroke ([place])   . Tobacco abuse   . Vitamin D deficiency     Social History:  Social History   Socioeconomic History  . Marital status: Single    Spouse name: Not on file  . Number of children: Not on file  . Years of education: Not on file  . Highest education level: Not on file  Social Needs  . Financial resource strain: Not on file  . Food insecurity - worry: Not on file  . Food insecurity - inability: Not on file  . Transportation needs - medical: Not on file  . Transportation needs - non-medical: Not on file  Occupational History  . Not on file  Tobacco Use  . Smoking status: Current Every Day Smoker    Packs/day: 0.50    Years: 39.00    Pack years: 19.50    Types: Cigarettes  . Smokeless  tobacco: Never Used  . Tobacco comment: pt is still smoking  Substance and Sexual Activity  . Alcohol use: No  . Drug use: No  . Sexual activity: Not on file  Other Topics Concern  . Not on file  Social History Narrative  . Not on file    Medications:   Current Outpatient Medications on File Prior to Visit  Medication Sig Dispense Refill  . albuterol (PROVENTIL HFA;VENTOLIN HFA) 108 (90 Base) MCG/ACT inhaler Inhale into the lungs every 6 (six) hours as needed for wheezing or shortness of breath.    Marland Kitchen alendronate (FOSAMAX) 70 MG tablet Take 70 mg by mouth once a week. Fridays    . aspirin 325 MG tablet Take 325 mg daily by mouth.    Marland Kitchen atorvastatin (LIPITOR) 40 MG tablet Take 40 mg daily by mouth.    Marland Kitchen buPROPion (WELLBUTRIN XL) 150 MG 24 hr tablet Take 150 mg by mouth daily.     . calcium carbonate (  OS-CAL) 600 MG TABS tablet Take 600 mg by mouth 2 (two) times daily with a meal.     . citalopram (CELEXA) 40 MG tablet Take 40 mg by mouth daily.     . clonazePAM (KLONOPIN) 0.5 MG tablet Take by mouth.    . clopidogrel (PLAVIX) 75 MG tablet Take 75 mg daily by mouth.    . fluticasone (FLONASE) 50 MCG/ACT nasal spray Place 2 sprays daily into both nostrils.    Marland Kitchen guaiFENesin-dextromethorphan (ROBITUSSIN DM) 100-10 MG/5ML syrup Take by mouth.    . loratadine (CLARITIN) 10 MG tablet Take 10 mg by mouth daily.    . polyethylene glycol (MIRALAX / GLYCOLAX) packet Take 17 g by mouth daily as needed.    . potassium chloride (K-DUR) 10 MEQ tablet Take 10 mEq by mouth daily.     . prochlorperazine (COMPAZINE) 5 MG tablet Take 5 mg by mouth every 6 (six) hours as needed for nausea or vomiting.    . risperiDONE (RISPERDAL) 3 MG tablet Take 3 mg by mouth 2 (two) times daily.    . traZODone (DESYREL) 50 MG tablet Take 0.5-1 tablets (25-50 mg total) by mouth at bedtime as needed for sleep.     No current facility-administered medications on file prior to visit.     Allergies:  No Known  Allergies  Physical Exam General: Obese middle-aged African-American male, seated, in no evident distress Head: head normocephalic and atraumatic.  Neck: supple with no carotid or supraclavicular bruits Cardiovascular: regular rate and rhythm, no murmurs Musculoskeletal: no deformity Skin:  no rash/petichiae Vascular:  Normal pulses all extremities Vitals:   11/24/17 1048  BP: 98/74  Pulse: 85   Neurologic Exam Mental Status: Awake and fully alert. Diminished attention, registration and recall. Speech is slurred but can be understood with some difficulty. Can comprehend midline and one-step commands only. Attention span, concentration and fund of knowledge poor. Mood and affect appropriate.  Cranial Nerves: Fundoscopic exam not done. Pupils equal, briskly reactive to light. Extraocular movements full without nystagmus. Visual fields full to confrontation. Hearing intact. Facial sensation intact. Face, tongue, palate moves normally and symmetrically.  Motor: Normal bulk and tone. Normal strength in all tested extremity muscles. Sensory.: intact to touch ,pinprick .position and vibratory sensation.  Coordination: Rapid alternating movements normal in all extremities. Finger-to-nose and heel-to-shin performed accurately bilaterally. Gait and Station: Arises from chair without difficulty. Stance is normal. Gait demonstrates normal stride length and balance . Able to heel, toe and tandem walk with difficulty.  Reflexes: 1+ and symmetric. Toes downgoing.   NIHSS  6 Modified Rankin  4   ASSESSMENT: 57 year old African-American male with Down syndrome with cryptogenic left MCA infarct in August 2018 who seems to be doing reasonably well. Vascular risk factors of hypertension and hyperlipidemia and mild obesity. New onset of seizures since December 2018 now started on Vimpat    PLAN:  I had a long d/w patient and his group home caregiver about his recent cryptogenic stroke, risk for  recurrent stroke/TIAs seizures, personally independently reviewed imaging studies and stroke evaluation results and answered questions.given patient's Down syndrome and underlying cognitive impairment I do not believe further workup for cryptogenic stroke etiology or endovascular PFO closure is necessary.  . He apparently had some seizures while visiting family in Christmas and has been started on lacosamide( Vimpat ) 200 mg twice daily which he seems to be tolerating well and I recommend he continue it. Check EEG. Continue aspirin for stroke prevention with strict  control of lipids with LDL cholesterol goal below 70 mg percent and hypertension with blood pressure goal below 130/90. Return for follow-up in 6 months with nurse practitioner Mario Liu or call earlier if necessary Greater than 50% of time during this 25 minute visit was spent on counseling,explanation of diagnosis of stroke, seizures cognitive impairment, planning of further management, discussion with patient and family and coordination of care Antony Contras, MD  [facility] [address] [place], [place] [address]  Phone [phone] Fax [phone] Note: This document was prepared with digital dictation and possible smart phrase technology. Any transcriptional errors that result from this process are unintentional

## 2017-11-24 NOTE — Patient Instructions (Signed)
I had a long discussion with the patient and his transporter from the group home where his pain. He apparently had some seizures while visiting family in Christmas and has been started on lacosamide( Vimpat ) 200 mg twice daily which he seems to be tolerating well and I recommend he continue it. Check EEG. Continue aspirin for stroke prevention with strict control of lipids with LDL cholesterol goal below 70 mg percent and hypertension with blood pressure goal below 130/90. Return for follow-up in 6 months with nurse practitioner Janett Billow or call earlier if necessary

## 2017-12-17 ENCOUNTER — Ambulatory Visit (INDEPENDENT_AMBULATORY_CARE_PROVIDER_SITE_OTHER): Payer: Medicare Other

## 2017-12-17 DIAGNOSIS — G40909 Epilepsy, unspecified, not intractable, without status epilepticus: Secondary | ICD-10-CM | POA: Diagnosis not present

## 2017-12-24 ENCOUNTER — Telehealth: Payer: Self-pay

## 2017-12-24 ENCOUNTER — Telehealth: Payer: Self-pay | Admitting: Neurology

## 2017-12-24 NOTE — Telephone Encounter (Signed)
-----   Message from Garvin Fila, MD sent at 12/23/2017 10:49 AM EDT ----- Mitchell Heir inform the patient that EEG study was unremarkable. No definite evidence of seizures noted

## 2017-12-24 NOTE — Telephone Encounter (Signed)
Called the patient to review EEG results.  No answer. LVM for the patient to call back.   f pt returns call please inform the patient that the EEG did not show seizure activity and was a normal EEG finding.

## 2017-12-24 NOTE — Telephone Encounter (Signed)
Rn spoke with Radio broadcast assistant at pts group home. Rn stated the patients EEG study was unremarkable. No definite evidence of seizures noted. Rn stated to [place] that pt needs to continue the vimpat until next appt. [place] verbalized understanding. ------

## 2017-12-24 NOTE — Telephone Encounter (Signed)
-----   Message from Garvin Fila, MD sent at 12/23/2017 10:49 AM EDT ----- Mario Liu inform the patient that EEG study was unremarkable. No definite evidence of seizures noted

## 2018-03-01 ENCOUNTER — Emergency Department: Payer: Medicare Other

## 2018-03-01 ENCOUNTER — Encounter: Payer: Self-pay | Admitting: Emergency Medicine

## 2018-03-01 ENCOUNTER — Other Ambulatory Visit: Payer: Self-pay

## 2018-03-01 ENCOUNTER — Observation Stay
Admission: EM | Admit: 2018-03-01 | Discharge: 2018-03-03 | Disposition: A | Discharge status: Home / Self Care | Payer: Medicare Other | Attending: Specialist | Admitting: Specialist

## 2018-03-01 DIAGNOSIS — G40909 Epilepsy, unspecified, not intractable, without status epilepticus: Secondary | ICD-10-CM

## 2018-03-01 DIAGNOSIS — E785 Hyperlipidemia, unspecified: Secondary | ICD-10-CM | POA: Insufficient documentation

## 2018-03-01 DIAGNOSIS — Z7951 Long term (current) use of inhaled steroids: Secondary | ICD-10-CM | POA: Diagnosis not present

## 2018-03-01 DIAGNOSIS — R4182 Altered mental status, unspecified: Principal | ICD-10-CM | POA: Insufficient documentation

## 2018-03-01 DIAGNOSIS — Z7902 Long term (current) use of antithrombotics/antiplatelets: Secondary | ICD-10-CM | POA: Insufficient documentation

## 2018-03-01 DIAGNOSIS — F17210 Nicotine dependence, cigarettes, uncomplicated: Secondary | ICD-10-CM | POA: Diagnosis not present

## 2018-03-01 DIAGNOSIS — Z7982 Long term (current) use of aspirin: Secondary | ICD-10-CM | POA: Insufficient documentation

## 2018-03-01 DIAGNOSIS — F329 Major depressive disorder, single episode, unspecified: Secondary | ICD-10-CM | POA: Diagnosis not present

## 2018-03-01 DIAGNOSIS — J449 Chronic obstructive pulmonary disease, unspecified: Secondary | ICD-10-CM | POA: Insufficient documentation

## 2018-03-01 DIAGNOSIS — Z8673 Personal history of transient ischemic attack (TIA), and cerebral infarction without residual deficits: Secondary | ICD-10-CM | POA: Diagnosis not present

## 2018-03-01 DIAGNOSIS — Q909 Down syndrome, unspecified: Secondary | ICD-10-CM | POA: Diagnosis not present

## 2018-03-01 DIAGNOSIS — R531 Weakness: Secondary | ICD-10-CM | POA: Diagnosis present

## 2018-03-01 DIAGNOSIS — Z79899 Other long term (current) drug therapy: Secondary | ICD-10-CM | POA: Diagnosis not present

## 2018-03-01 DIAGNOSIS — F419 Anxiety disorder, unspecified: Secondary | ICD-10-CM | POA: Diagnosis not present

## 2018-03-01 DIAGNOSIS — N390 Urinary tract infection, site not specified: Secondary | ICD-10-CM | POA: Insufficient documentation

## 2018-03-01 DIAGNOSIS — R296 Repeated falls: Secondary | ICD-10-CM

## 2018-03-01 DIAGNOSIS — Z7983 Long term (current) use of bisphosphonates: Secondary | ICD-10-CM | POA: Insufficient documentation

## 2018-03-01 DIAGNOSIS — R471 Dysarthria and anarthria: Secondary | ICD-10-CM

## 2018-03-01 HISTORY — DX: Repeated falls: R29.6

## 2018-03-01 LAB — ETHANOL: Alcohol, Ethyl (B): <10 mg/dL (ref <10)

## 2018-03-01 LAB — COMPREHENSIVE METABOLIC PANEL
ALK PHOS: 47 U/L (ref 38–126)
ALT: 21 U/L (ref 17–63)
AST: 25 U/L (ref 15–41)
Albumin: 3.8 g/dL (ref 3.5–5.0)
Anion gap: 7 (ref 5–15)
BUN: 16 mg/dL (ref 6–20)
CALCIUM: 9.4 mg/dL (ref 8.9–10.3)
CO2: 26 mmol/L (ref 22–32)
CREATININE: 1.11 mg/dL (ref 0.61–1.24)
Chloride: 105 mmol/L (ref 101–111)
GFR calc non Af Amer: >60 mL/min (ref >60)
Glucose, Bld: 85 mg/dL (ref 65–99)
Potassium: 3.9 mmol/L (ref 3.5–5.1)
SODIUM: 138 mmol/L (ref 135–145)
Total Bilirubin: 0.8 mg/dL (ref 0.3–1.2)
Total Protein: 7.2 g/dL (ref 6.5–8.1)

## 2018-03-01 LAB — CBC WITH DIFFERENTIAL/PLATELET
Basophils Absolute: 0 10*3/uL (ref 0–0.1)
Basophils Relative: 1 %
EOS ABS: 0.3 10*3/uL (ref 0–0.7)
EOS PCT: 6 %
HCT: 40.7 % (ref 40.0–52.0)
HEMOGLOBIN: 13.8 g/dL (ref 13.0–18.0)
LYMPHS ABS: 2.3 10*3/uL (ref 1.0–3.6)
Lymphocytes Relative: 45 %
MCH: 29.1 pg (ref 26.0–34.0)
MCHC: 34 g/dL (ref 32.0–36.0)
MCV: 85.7 fL (ref 80.0–100.0)
MONOS PCT: 10 %
Monocytes Absolute: 0.5 10*3/uL (ref 0.2–1.0)
Neutro Abs: 1.9 10*3/uL (ref 1.4–6.5)
Neutrophils Relative %: 38 %
Platelets: 96 10*3/uL — ABNORMAL LOW (ref 150–440)
RBC: 4.74 MIL/uL (ref 4.40–5.90)
RDW: 12.9 % (ref 11.5–14.5)
WBC: 5 10*3/uL (ref 3.8–10.6)

## 2018-03-01 LAB — AMMONIA: Ammonia: 15 umol/L (ref 9–35)

## 2018-03-01 LAB — TROPONIN I: Troponin I: <0.03 ng/mL (ref <0.03)

## 2018-03-01 MED ORDER — CALCIUM CARBONATE ANTACID 500 MG PO CHEW
1500.0000 mg | CHEWABLE_TABLET | Freq: Two times a day (BID) | ORAL | Status: DC
  Administered 2018-03-02 – 2018-03-03 (×3): 1500 mg via ORAL
  Filled 2018-03-01 (×3): qty 8

## 2018-03-01 MED ORDER — ONDANSETRON HCL 4 MG/2ML IJ SOLN: 4.0000 mg | Freq: Four times a day (QID) | INTRAMUSCULAR | Status: DC | PRN

## 2018-03-01 MED ORDER — SODIUM CHLORIDE 0.9 % IV BOLUS
1000.0000 mL | Freq: Once | INTRAVENOUS | Status: AC
  Administered 2018-03-01: 1000 mL via INTRAVENOUS

## 2018-03-01 MED ORDER — CLOPIDOGREL BISULFATE 75 MG PO TABS
75.0000 mg | ORAL_TABLET | Freq: Every day | ORAL | Status: DC
Start: 2018-03-02 — End: 2018-03-03
  Administered 2018-03-02 – 2018-03-03 (×2): 75 mg via ORAL
  Filled 2018-03-01 (×2): qty 1

## 2018-03-01 MED ORDER — ASPIRIN EC 325 MG PO TBEC
325.0000 mg | DELAYED_RELEASE_TABLET | Freq: Every day | ORAL | Status: DC
  Administered 2018-03-02 – 2018-03-03 (×2): 325 mg via ORAL
  Filled 2018-03-01 (×2): qty 1

## 2018-03-01 MED ORDER — BUPROPION HCL ER (XL) 150 MG PO TB24
150.0000 mg | ORAL_TABLET | Freq: Every day | ORAL | Status: DC
  Administered 2018-03-02 – 2018-03-03 (×2): 150 mg via ORAL
  Filled 2018-03-01 (×3): qty 1

## 2018-03-01 MED ORDER — ACETAMINOPHEN 325 MG PO TABS: 650.0000 mg | ORAL_TABLET | Freq: Four times a day (QID) | ORAL | Status: DC | PRN

## 2018-03-01 MED ORDER — ENOXAPARIN SODIUM 40 MG/0.4ML ~~LOC~~ SOLN
40.0000 mg | Freq: Two times a day (BID) | SUBCUTANEOUS | Status: DC
  Administered 2018-03-01: 40 mg via SUBCUTANEOUS
  Filled 2018-03-01: qty 0.4

## 2018-03-01 MED ORDER — RISPERIDONE 3 MG PO TABS
3.0000 mg | ORAL_TABLET | Freq: Two times a day (BID) | ORAL | Status: DC
  Administered 2018-03-01 – 2018-03-03 (×4): 3 mg via ORAL
  Filled 2018-03-01 (×6): qty 1

## 2018-03-01 MED ORDER — ACETAMINOPHEN 650 MG RE SUPP: 650.0000 mg | Freq: Four times a day (QID) | RECTAL | Status: DC | PRN

## 2018-03-01 MED ORDER — ATORVASTATIN CALCIUM 20 MG PO TABS
40.0000 mg | ORAL_TABLET | Freq: Every day | ORAL | Status: DC
  Administered 2018-03-02 – 2018-03-03 (×2): 40 mg via ORAL
  Filled 2018-03-01 (×2): qty 2

## 2018-03-01 MED ORDER — POLYETHYLENE GLYCOL 3350 17 G PO PACK: 17.0000 g | PACK | Freq: Every day | ORAL | Status: DC | PRN

## 2018-03-01 MED ORDER — SODIUM CHLORIDE 0.9 % IV SOLN
INTRAVENOUS | Status: DC
  Administered 2018-03-01: 23:00:00 via INTRAVENOUS

## 2018-03-01 MED ORDER — CITALOPRAM HYDROBROMIDE 20 MG PO TABS
40.0000 mg | ORAL_TABLET | Freq: Every day | ORAL | Status: DC
  Administered 2018-03-02 – 2018-03-03 (×2): 40 mg via ORAL
  Filled 2018-03-01 (×2): qty 2

## 2018-03-01 MED ORDER — ALBUTEROL SULFATE (2.5 MG/3ML) 0.083% IN NEBU: 2.5000 mg | INHALATION_SOLUTION | Freq: Four times a day (QID) | RESPIRATORY_TRACT | Status: DC | PRN

## 2018-03-01 MED ORDER — ONDANSETRON HCL 4 MG PO TABS: 4.0000 mg | ORAL_TABLET | Freq: Four times a day (QID) | ORAL | Status: DC | PRN

## 2018-03-01 MED ORDER — LORATADINE 10 MG PO TABS
10.0000 mg | ORAL_TABLET | Freq: Every day | ORAL | Status: DC
  Administered 2018-03-02 – 2018-03-03 (×2): 10 mg via ORAL
  Filled 2018-03-01 (×2): qty 1

## 2018-03-01 MED ORDER — FLUTICASONE PROPIONATE 50 MCG/ACT NA SUSP
2.0000 | Freq: Every day | NASAL | Status: DC
Start: 2018-03-02 — End: 2018-03-03
  Administered 2018-03-02 – 2018-03-03 (×2): 2 via NASAL
  Filled 2018-03-01 (×3): qty 16

## 2018-03-01 MED ORDER — LACOSAMIDE 50 MG PO TABS
200.0000 mg | ORAL_TABLET | Freq: Two times a day (BID) | ORAL | Status: DC
  Administered 2018-03-01 – 2018-03-03 (×4): 200 mg via ORAL
  Filled 2018-03-01 (×4): qty 4

## 2018-03-01 NOTE — ED Provider Notes (Signed)
Mario Liu ____________________________________________   First MD Initiated Contact with Patient 03/01/18 1824     (approximate)  I have reviewed the triage vital signs and the nursing notes.   HISTORY  Chief Complaint Weakness  Level 5 caveat: History of present illness limited due to altered mental status  HPI Tag Mario Liu is a 57 y.o. male with PMH as noted below who presents with altered mental status and generalized weakness.  Per a caregiver from his group home, he was told that the patient had 4 falls today while at his day program.  Upon return to the group home, the patient appeared confused and weak.  The caregiver states that the last time he has worked with this patient was a few months ago, so he does not know when he was last "normal" or when the current change began.  There is no report of when the patient was last seen at his baseline.  The patient currently denies any acute complaints.  Past Medical History:  Diagnosis Date  . COPD (chronic obstructive pulmonary disease) ([place])   . Hepatitis C   . Hx of cholecystectomy   . Hypothyroidism   . Kidney cysts   . Major depressive disorder    Was treated at facility in [place] ([facility]) years ago  . Stroke ([place])   . Tobacco abuse   . Vitamin D deficiency     Patient Active Problem List   Diagnosis Date Noted  . Seizure disorder ([place]) 11/24/2017  . Major depressive disorder   . Hepatitis C antibody test positive 05/16/2017  . Acute ischemic left MCA stroke ([place])   . Down's syndrome   . PFO with atrial septal aneurysm     Past Surgical History:  Procedure Laterality Date  . LAPAROSCOPIC CHOLECYSTECTOMY      Prior to Admission medications   Medication Sig Start Date End Date Taking? Authorizing Provider  albuterol (PROVENTIL HFA;VENTOLIN HFA) 108 (90 Base) MCG/ACT inhaler Inhale into the lungs every 6 (six) hours as needed for wheezing or  shortness of breath.    [provider]  alendronate (FOSAMAX) 70 MG tablet Take 70 mg by mouth once a week. Fridays    [provider]  aspirin 325 MG tablet Take 325 mg daily by mouth.    [provider]  atorvastatin (LIPITOR) 40 MG tablet Take 40 mg daily by mouth.    [provider]  buPROPion (WELLBUTRIN XL) 150 MG 24 hr tablet Take 150 mg by mouth daily.     [provider]  calcium carbonate (OS-CAL) 600 MG TABS tablet Take 600 mg by mouth 2 (two) times daily with a meal.     [provider]  citalopram (CELEXA) 40 MG tablet Take 40 mg by mouth daily.     [provider]  clonazePAM (KLONOPIN) 0.5 MG tablet Take by mouth.    [provider]  clopidogrel (PLAVIX) 75 MG tablet Take 75 mg daily by mouth.    [provider]  fluticasone (FLONASE) 50 MCG/ACT nasal spray Place 2 sprays daily into both nostrils.    [provider]  guaiFENesin-dextromethorphan (ROBITUSSIN DM) 100-10 MG/5ML syrup Take by mouth.    [provider]  loratadine (CLARITIN) 10 MG tablet Take 10 mg by mouth daily.    [provider]  polyethylene glycol (MIRALAX / GLYCOLAX) packet Take 17 g by mouth daily as needed.    [provider]  potassium chloride (K-DUR) 10 MEQ tablet Take 10 mEq by mouth daily.     [provider]  prochlorperazine (COMPAZINE) 5 MG tablet Take 5 mg by mouth every 6 (six) hours as needed for nausea or vomiting.    [provider]  risperiDONE (RISPERDAL) 3 MG tablet Take 3 mg by mouth 2 (two) times daily.    [provider]  traZODone (DESYREL) 50 MG tablet Take 0.5-1 tablets (25-50 mg total) by mouth at bedtime as needed for sleep. 05/25/17   Bary Leriche, PA-C    Allergies Patient has no known allergies.  Family History  Adopted: Yes  Problem Relation Age of Onset  . Diabetes Maternal Grandmother     Social History Social History   Tobacco  Use  . Smoking status: Current Every Day Smoker    Packs/day: 0.50    Years: 39.00    Pack years: 19.50    Types: Cigarettes  . Smokeless tobacco: Never Used  . Tobacco comment: pt is still smoking  Substance Use Topics  . Alcohol use: No  . Drug use: No    Review of Systems Level 5 caveat: Unable to obtain review of systems due to altered mental status    ____________________________________________   PHYSICAL EXAM:  VITAL SIGNS: ED Triage Vitals  Enc Vitals Group     BP 03/01/18 1807 102/78     Pulse Rate 03/01/18 1807 71     Resp 03/01/18 1813 18     Temp 03/01/18 1807 98.8 F (37.1 C)     Temp Source 03/01/18 1807 Oral     SpO2 03/01/18 1807 95 %     Weight 03/01/18 1807 225 lb (102.1 kg)     Height 03/01/18 1807 4\' 2"  (1.27 m)     Head Circumference --      Peak Flow --      Pain Score 03/01/18 1813 0     Pain Loc --      Pain Edu? --      Excl. in [place]? --     Constitutional: Alert, confused.  Weak appearing. Eyes: Conjunctivae are normal.  EOMI.  PERRLA. Head: Atraumatic. Nose: No congestion/rhinnorhea. Mouth/Throat: Mucous membranes are somewhat dry.   Neck: Normal range of motion.  Cardiovascular: Normal rate, regular rhythm. Grossly normal heart sounds.  Good peripheral circulation. Respiratory: Normal respiratory effort.  No retractions. Lungs CTAB. Gastrointestinal: Soft and nontender. No distention.  Genitourinary: No flank tenderness. Musculoskeletal: No lower extremity edema.  Extremities warm and well perfused.  Neurologic: Somewhat sleepy appearing.  Slurred speech.  Bilateral upper and lower extremity mild weakness. Skin:  Skin is warm and dry. No rash noted. Psychiatric: Calm and cooperative.  ____________________________________________   LABS (all labs ordered are listed, but only abnormal results are displayed)  Labs Reviewed  CBC WITH DIFFERENTIAL/PLATELET - Abnormal; Notable for the following components:      Result Value    Platelets 96 (*)    All other components within normal limits  BLOOD GAS, VENOUS - Abnormal; Notable for the following components:   Bicarbonate 29.5 (*)    Acid-Base Excess 3.1 (*)    All other components within normal limits  COMPREHENSIVE METABOLIC PANEL  ETHANOL  TROPONIN I  URINALYSIS, COMPLETE (UACMP) WITH MICROSCOPIC  URINE DRUG SCREEN, QUALITATIVE ([facility] ONLY)  AMMONIA   ____________________________________________  EKG  ED ECG REPORT I, Arta Silence, the attending physician, personally viewed and interpreted this ECG.  Date: 03/01/2018 EKG Time: 1813 Rate:  72 Rhythm: normal sinus rhythm QRS Axis: normal Intervals: normal ST/T Wave abnormalities: Nonspecific T wave flattening Narrative Interpretation: no evidence of acute ischemia  ____________________________________________  RADIOLOGY  CT head: Pending CXR: No focal infiltrate XR left knee and tib-fib: Pending  ____________________________________________   PROCEDURES  Procedure(s) performed: No  Procedures  Critical Care performed: No ____________________________________________   INITIAL IMPRESSION / ASSESSMENT AND PLAN / ED COURSE  Pertinent labs & imaging results that were available during my care of the patient were reviewed by me and considered in my medical decision making (see chart for details).  57 year old male with PMH as noted above including COPD, hepatitis C, seizure disorder, stroke, Down syndrome presents with altered mental status, generalized weakness, slurred speech, with unknown onset.  On exam, the vital signs are normal, the patient is in no acute distress but he is weak appearing.  The neuro exam reveals global weakness and slurred speech but no unilateral symptoms or focal findings.  Although the onset appears to be somewhat acute, given the global symptoms and lack of focal findings, I have relatively low suspicion for acute stroke.  Patient is out of the window for  any acute intervention.  Differential also includes hypercapnia related to COPD, electrolyte abnormality or other metabolic etiology, medication side effect, substance abuse, or hepatic encephalopathy.  Plan: CT head, labs, infection work-up, and reassess.  ----------------------------------------- 8:32 PM on 03/01/2018 -----------------------------------------  Patient's lab work-up so far has been unrevealing.  He is still pending CT head, UA, and ammonia.  Given his persistent weakness and slurred speech he will require admission for stroke work-up even if these tests are negative.  I will admit the patient to the hospitalist.  ____________________________________________   FINAL CLINICAL IMPRESSION(S) / ED DIAGNOSES  Final diagnoses:  Weakness  Dysarthria      NEW MEDICATIONS STARTED DURING THIS VISIT:  New Prescriptions   No medications on file     Liu:  This document was prepared using Dragon voice recognition software and may include unintentional dictation errors.    Arta Silence, MD 03/01/18 2034

## 2018-03-01 NOTE — ED Notes (Signed)
This RN contacted [name], pt guardian to discuss pt's admission.

## 2018-03-01 NOTE — H&P (Signed)
[facility] [place] NAME: Mario Liu    MR#:  [id number]  DATE OF BIRTH:  [birth year]  DATE OF ADMISSION:  03/01/2018  PRIMARY CARE PHYSICIAN: [name], FNP   REQUESTING/REFERRING PHYSICIAN:  Arta Silence, MD     CHIEF COMPLAINT:   Chief Complaint  Patient presents with  . Weakness    HISTORY OF PRESENT ILLNESS: Mario Liu  is a 57 y.o. male with a known history of COPD, hepatitis C, hypothyroidism, major depressive disorder in the past previous stroke who is presenting to the emergency room with altered mental status and generalized weakness.  Per caregivers from his group home patient has had 4 falls today.  He is also confused and weak compared to his baseline.  Unable to obtain patient's last known normal baseline. Patient states that he has been falling a lot but other than that he is not able to give any review of systems  PAST MEDICAL HISTORY:   Past Medical History:  Diagnosis Date  . COPD (chronic obstructive pulmonary disease) ([place])   . Hepatitis C   . Hx of cholecystectomy   . Hypothyroidism   . Kidney cysts   . Major depressive disorder    Was treated at facility in [place] ([facility]) years ago  . Stroke ([place])   . Tobacco abuse   . Vitamin D deficiency     PAST SURGICAL HISTORY:  Past Surgical History:  Procedure Laterality Date  . LAPAROSCOPIC CHOLECYSTECTOMY      SOCIAL HISTORY:  Social History   Tobacco Use  . Smoking status: Current Every Day Smoker    Packs/day: 0.50    Years: 39.00    Pack years: 19.50    Types: Cigarettes  . Smokeless tobacco: Never Used  . Tobacco comment: pt is still smoking  Substance Use Topics  . Alcohol use: No    FAMILY HISTORY:  Family History  Adopted: Yes  Problem Relation Age of Onset  . Diabetes Maternal Grandmother     DRUG ALLERGIES: No Known Allergies  REVIEW OF SYSTEMS:   CONSTITUTIONAL:  Unable to provide due to his mental  status   MEDICATIONS AT HOME:  Prior to Admission medications   Medication Sig Start Date End Date Taking? Authorizing Provider  albuterol (PROVENTIL HFA;VENTOLIN HFA) 108 (90 Base) MCG/ACT inhaler Inhale into the lungs every 6 (six) hours as needed for wheezing or shortness of breath.    [provider]  alendronate (FOSAMAX) 70 MG tablet Take 70 mg by mouth once a week. Fridays    [provider]  aspirin 325 MG tablet Take 325 mg daily by mouth.    [provider]  atorvastatin (LIPITOR) 40 MG tablet Take 40 mg daily by mouth.    [provider]  buPROPion (WELLBUTRIN XL) 150 MG 24 hr tablet Take 150 mg by mouth daily.     [provider]  calcium carbonate (OS-CAL) 600 MG TABS tablet Take 600 mg by mouth 2 (two) times daily with a meal.     [provider]  citalopram (CELEXA) 40 MG tablet Take 40 mg by mouth daily.     [provider]  clonazePAM (KLONOPIN) 0.5 MG tablet Take by mouth.    [provider]  clopidogrel (PLAVIX) 75 MG tablet Take 75 mg daily by mouth.    [provider]  fluticasone (FLONASE) 50 MCG/ACT nasal spray Place 2 sprays daily into both nostrils.  [provider]  guaiFENesin-dextromethorphan (ROBITUSSIN DM) 100-10 MG/5ML syrup Take by mouth.    [provider]  loratadine (CLARITIN) 10 MG tablet Take 10 mg by mouth daily.    [provider]  polyethylene glycol (MIRALAX / GLYCOLAX) packet Take 17 g by mouth daily as needed.    [provider]  potassium chloride (K-DUR) 10 MEQ tablet Take 10 mEq by mouth daily.     [provider]  prochlorperazine (COMPAZINE) 5 MG tablet Take 5 mg by mouth every 6 (six) hours as needed for nausea or vomiting.    [provider]  risperiDONE (RISPERDAL) 3 MG tablet Take 3 mg by mouth 2 (two) times daily.    [provider]  traZODone (DESYREL) 50 MG tablet Take 0.5-1 tablets (25-50 mg  total) by mouth at bedtime as needed for sleep. 05/25/17   Love, Ivan Anchors, PA-C      PHYSICAL EXAMINATION:   VITAL SIGNS: Blood pressure (!) 133/94, pulse 69, temperature 98.8 F (37.1 C), temperature source Oral, resp. rate 14, height 5\' 6"  (1.676 m), weight 102.1 kg (225 lb), SpO2 100 %.  GENERAL:  57 y.o.-year-old patient lying in the bed with no acute distress.  EYES: Pupils equal, round, reactive to light and accommodation. No scleral icterus. Extraocular muscles intact.  HEENT: Head atraumatic, normocephalic. Oropharynx and nasopharynx clear.  NECK:  Supple, no jugular venous distention. No thyroid enlargement, no tenderness.  LUNGS: Normal breath sounds bilaterally, no wheezing, rales,rhonchi or crepitation. No use of accessory muscles of respiration.  CARDIOVASCULAR: S1, S2 normal. No murmurs, rubs, or gallops.  ABDOMEN: Soft, nontender, nondistended. Bowel sounds present. No organomegaly or mass.  EXTREMITIES: No pedal edema, cyanosis, or clubbing.  NEUROLOGIC: Moving all extremities PSYCHIATRIC: The patient is   Awake but not oriented sKIN: No obvious rash, lesion, or ulcer.   LABORATORY PANEL:   CBC Recent Labs  Lab 03/01/18 1847  WBC 5.0  HGB 13.8  HCT 40.7  PLT 96*  MCV 85.7  MCH 29.1  MCHC 34.0  RDW 12.9  LYMPHSABS 2.3  MONOABS 0.5  EOSABS 0.3  BASOSABS 0.0   ------------------------------------------------------------------------------------------------------------------  Chemistries  Recent Labs  Lab 03/01/18 1847  NA 138  K 3.9  CL 105  CO2 26  GLUCOSE 85  BUN 16  CREATININE 1.11  CALCIUM 9.4  AST 25  ALT 21  ALKPHOS 47  BILITOT 0.8   ------------------------------------------------------------------------------------------------------------------ estimated creatinine clearance is 85.1 mL/min (by C-G formula based on SCr of 1.11  mg/dL). ------------------------------------------------------------------------------------------------------------------ No results for input(s): TSH, T4TOTAL, T3FREE, THYROIDAB in the last 72 hours.  Invalid input(s): FREET3   Coagulation profile No results for input(s): INR, PROTIME in the last 168 hours. ------------------------------------------------------------------------------------------------------------------- No results for input(s): DDIMER in the last 72 hours. -------------------------------------------------------------------------------------------------------------------  Cardiac Enzymes Recent Labs  Lab 03/01/18 1847  TROPONINI <0.03   ------------------------------------------------------------------------------------------------------------------ Invalid input(s): POCBNP  ---------------------------------------------------------------------------------------------------------------  Urinalysis    Component Value Date/Time   COLORURINE YELLOW (A) 05/14/2017 0816   APPEARANCEUR CLEAR (A) 05/14/2017 0816   LABSPEC 1.014 05/14/2017 0816   PHURINE 6.0 05/14/2017 0816   GLUCOSEU NEGATIVE 05/14/2017 0816   HGBUR MODERATE (A) 05/14/2017 0816   BILIRUBINUR NEGATIVE 05/14/2017 0816   KETONESUR NEGATIVE 05/14/2017 0816   PROTEINUR NEGATIVE 05/14/2017 0816   NITRITE NEGATIVE 05/14/2017 0816   LEUKOCYTESUR NEGATIVE 05/14/2017 0816     RADIOLOGY: Dg Knee 2 Views Left  Result Date: 03/01/2018 CLINICAL DATA:  Left leg pain after fall today. EXAM: LEFT  KNEE - 1-2 VIEW COMPARISON:  None. FINDINGS: No evidence of fracture, dislocation, or joint effusion. Mild narrowing of medial joint space is noted. Soft tissues are unremarkable. IMPRESSION: Mild degenerative joint disease is noted medially. No acute abnormality seen in the left knee. Electronically Signed   By: Marijo Conception, M.D.   On: 03/01/2018 20:45   Dg Tibia/fibula Left  Result Date: 03/01/2018 CLINICAL  DATA:  Left leg pain after multiple falls today. EXAM: LEFT TIBIA AND FIBULA - 2 VIEW COMPARISON:  None. FINDINGS: There is no evidence of fracture or other focal bone lesions. Soft tissues are unremarkable. IMPRESSION: Normal left tibia and fibula. Electronically Signed   By: Marijo Conception, M.D.   On: 03/01/2018 20:47   Ct Head Wo Contrast  Result Date: 03/01/2018 CLINICAL DATA:  Generalized weakness and slurred speech EXAM: CT HEAD WITHOUT CONTRAST TECHNIQUE: Contiguous axial images were obtained from the base of the skull through the vertex without intravenous contrast. COMPARISON:  05/14/2017 FINDINGS: Brain: There are encephalomalacia changes in the distribution of the left MCA consistent with the prior infarct. No findings to suggest acute hemorrhage, acute infarction or space-occupying mass lesion are noted. Vascular: No hyperdense vessel or unexpected calcification. Skull: Normal. Negative for fracture or focal lesion. Sinuses/Orbits: Mild mucosal changes are noted within the paranasal sinuses stable from the prior exams. Other: None. IMPRESSION: Changes of prior left MCA infarct with encephalomalacia change. No acute abnormality is noted. Electronically Signed   By: Inez Catalina M.D.   On: 03/01/2018 20:29   Dg Chest Portable 1 View  Result Date: 03/01/2018 CLINICAL DATA:  Generalized weakness and slurred speech EXAM: PORTABLE CHEST 1 VIEW COMPARISON:  None. FINDINGS: Cardiac shadow is within normal limits. Mild bibasilar atelectatic changes are noted. No other focal abnormality is seen. IMPRESSION: Mild bibasilar atelectasis. Electronically Signed   By: Inez Catalina M.D.   On: 03/01/2018 19:15    EKG: Orders placed or performed during the hospital encounter of 03/01/18  . EKG 12-Lead  . EKG 12-Lead  . ED EKG  . ED EKG    IMPRESSION AND PLAN: Patient is a 57 year old brought in with recurrent falls and altered mental status  1.  Acute encephalopathy with falls etiology unclear We  will admit him to the hospital for further evaluation Obtain MRI of the brain Neurology consult Check TSH Check B12 Check RPR May need lumbar puncture No evidence infection currently Urine drug screen pending  2.  COPD without exasperation continue inhalers as doing at home  3.  Hyperlipidemia continue therapy with Lipitor  4.  History of depression anxiety hold his clonazepam and trazodone because this can make him drowsy as well   5.  Previous history of stroke continue Plavix   6.  Miscellaneous Lovenox for DVT prophylax All the records are reviewed and case discussed with ED provider. Management plans discussed with the patient, family and they are in agreement.  CODE STATUS: Code Status History    Date Active Date Inactive Code Status Order ID Comments User Context   05/18/2017 1552 05/26/2017 1637 Full Code [id number]  Flora Lipps Inpatient   05/18/2017 1552 05/18/2017 1552 Full Code [id number]  Flora Lipps Inpatient   05/14/2017 1847 05/18/2017 1542 Full Code [id number]  Maryellen Pile, MD Inpatient       TOTAL TIME TAKING CARE OF THIS PATIENT:55 minutes.    Dustin Flock M.D on 03/01/2018 at 9:45 PM  Between 7am to 6pm - Pager -  [phone]  After 6pm go to [email or web address] - password [email or web address]  [facility] Office  [phone]  CC: Primary care physician; [name], FNP

## 2018-03-01 NOTE — ED Provider Notes (Signed)
Mario Liu (Group Home caretaker)  [phone]   Arta Silence, MD 03/01/18 2011

## 2018-03-01 NOTE — ED Triage Notes (Signed)
Caregiver states came on shift about 1530 and noted patient general weakness and speech more slurred than usual. States fsbs 95 at facility. Patient normally more alert and can walk on own however now sitting weakly. States prior to his seeing patient he had been weak at daycare and had fallen 4 times at daycare today.

## 2018-03-01 NOTE — Progress Notes (Addendum)
Anticoagulation monitoring(Lovenox):  57 yo male ordered Lovenox 40 mg Q24h  Filed Weights   03/01/18 1807  Weight: 225 lb (102.1 kg)   BMI  Lab Results  Component Value Date   CREATININE 1.11 03/01/2018   CREATININE 0.96 07/27/2017   CREATININE 0.93 05/21/2017   Estimated Creatinine Clearance: 85.1 mL/min (by C-G formula based on SCr of 1.11 mg/dL). Hemoglobin & Hematocrit     Component Value Date/Time   HGB 13.8 03/01/2018 1847   HCT 40.7 03/01/2018 1847     Per Protocol for Patient with estCrcl > 30 ml/min and BMI > 40, will transition to Lovenox 40 mg Q12h.     03/02/2018 07:41 ABW updated and now reads 87.4 kg. BMI <40 - lovenox reduced to 40 mg once daily. Turner Kunzman A. [place], [place].D., BCPS

## 2018-03-01 NOTE — ED Notes (Signed)
Patient transported to CT 

## 2018-03-02 ENCOUNTER — Observation Stay: Payer: Medicare Other

## 2018-03-02 ENCOUNTER — Other Ambulatory Visit: Payer: Self-pay

## 2018-03-02 DIAGNOSIS — R4182 Altered mental status, unspecified: Secondary | ICD-10-CM | POA: Diagnosis not present

## 2018-03-02 DIAGNOSIS — R296 Repeated falls: Secondary | ICD-10-CM

## 2018-03-02 LAB — URINALYSIS, COMPLETE (UACMP) WITH MICROSCOPIC
BILIRUBIN URINE: NEGATIVE
GLUCOSE, UA: NEGATIVE mg/dL
Ketones, ur: NEGATIVE mg/dL
Nitrite: POSITIVE — AB
PH: 6 (ref 5.0–8.0)
Protein, ur: NEGATIVE mg/dL
SPECIFIC GRAVITY, URINE: 1.015 (ref 1.005–1.030)

## 2018-03-02 LAB — BASIC METABOLIC PANEL
ANION GAP: 7 (ref 5–15)
BUN: 13 mg/dL (ref 6–20)
CALCIUM: 8.1 mg/dL — AB (ref 8.9–10.3)
CO2: 23 mmol/L (ref 22–32)
CREATININE: 0.96 mg/dL (ref 0.61–1.24)
Chloride: 108 mmol/L (ref 101–111)
GFR calc Af Amer: >60 mL/min (ref >60)
Glucose, Bld: 100 mg/dL — ABNORMAL HIGH (ref 65–99)
Potassium: 3.6 mmol/L (ref 3.5–5.1)
Sodium: 138 mmol/L (ref 135–145)

## 2018-03-02 LAB — URINE DRUG SCREEN, QUALITATIVE (ARMC ONLY)
AMPHETAMINES, UR SCREEN: NOT DETECTED
Benzodiazepine, Ur Scrn: NOT DETECTED
COCAINE METABOLITE, UR ~~LOC~~: NOT DETECTED
Cannabinoid 50 Ng, Ur ~~LOC~~: NOT DETECTED
MDMA (Ecstasy)Ur Screen: NOT DETECTED
METHADONE SCREEN, URINE: NOT DETECTED
OPIATE, UR SCREEN: NOT DETECTED
PHENCYCLIDINE (PCP) UR S: NOT DETECTED
Tricyclic, Ur Screen: NOT DETECTED

## 2018-03-02 LAB — MRSA PCR SCREENING: MRSA by PCR: NEGATIVE

## 2018-03-02 LAB — CBC
HCT: 37.9 % — ABNORMAL LOW (ref 40.0–52.0)
Hemoglobin: 12.5 g/dL — ABNORMAL LOW (ref 13.0–18.0)
MCH: 28.6 pg (ref 26.0–34.0)
MCHC: 32.9 g/dL (ref 32.0–36.0)
MCV: 87.1 fL (ref 80.0–100.0)
PLATELETS: 83 10*3/uL — AB (ref 150–440)
RBC: 4.35 MIL/uL — ABNORMAL LOW (ref 4.40–5.90)
RDW: 12.8 % (ref 11.5–14.5)
WBC: 4.1 10*3/uL (ref 3.8–10.6)

## 2018-03-02 LAB — VITAMIN B12: VITAMIN B 12: 330 pg/mL (ref 180–914)

## 2018-03-02 MED ORDER — ENOXAPARIN SODIUM 40 MG/0.4ML ~~LOC~~ SOLN
40.0000 mg | SUBCUTANEOUS | Status: DC
  Administered 2018-03-02: 40 mg via SUBCUTANEOUS
  Filled 2018-03-02: qty 0.4

## 2018-03-02 MED ORDER — SODIUM CHLORIDE 0.9 % IV SOLN
1.0000 g | INTRAVENOUS | Status: DC
  Administered 2018-03-02: 1 g via INTRAVENOUS
  Filled 2018-03-02: qty 1
  Filled 2018-03-02: qty 10

## 2018-03-02 MED ORDER — ENOXAPARIN SODIUM 40 MG/0.4ML ~~LOC~~ SOLN: 40.0000 mg | Freq: Every day | SUBCUTANEOUS | Status: DC

## 2018-03-02 NOTE — Care Management Obs Status (Signed)
[place] NOTIFICATION   Patient Details  Name: Mario Liu MRN: [id number] Date of Birth: [birth year]   Medicare Observation Status Notification Given:  Yes. Legal guardian Johnnette Barrios, RN 03/02/2018, 8:50 AM

## 2018-03-02 NOTE — Plan of Care (Signed)
  Problem: Education: Goal: Knowledge of General Education information will improve Outcome: Progressing   Problem: Health Behavior/Discharge Planning: Goal: Ability to manage health-related needs will improve Outcome: Progressing   Problem: Clinical Measurements: Goal: Ability to maintain clinical measurements within normal limits will improve Outcome: Progressing Goal: Cardiovascular complication will be avoided Outcome: Progressing   Problem: Pain Managment: Goal: General experience of comfort will improve Outcome: Progressing   Problem: Safety: Goal: Ability to remain free from injury will improve Outcome: Progressing   Problem: Skin Integrity: Goal: Risk for impaired skin integrity will decrease Outcome: Progressing

## 2018-03-02 NOTE — Clinical Social Work Note (Signed)
Clinical Social Work Assessment  Patient Details  Name: Mario Liu MRN: [id number] Date of Birth: August 12, 1961  Date of referral:  03/02/18               Reason for consult:  Facility Placement                Permission sought to share information with:  Case Manager, Customer service manager, Family Supports Permission granted to share information::  Yes, Verbal Permission Granted  Name::        Agency::     Relationship::     Contact Information:     Housing/Transportation Living arrangements for the past 2 months:  Group Home Source of Information:  Guardian Patient Interpreter Needed:  None Criminal Activity/Legal Involvement Pertinent to Current Situation/Hospitalization:  No - Comment as needed Significant Relationships:  Siblings Lives with:  Facility Resident Do you feel safe going back to the place where you live?  Yes Need for family participation in patient care:  Yes (Comment)  Care giving concerns: Patient lives at [facility] (group home)    Social Worker assessment / plan:  CSW received consult for facility resident. CSW attempted to meet with patient but he is unable to complete assessment. CSW found that patient has a legal guardian which is his sister. CSW contacted patient's guardian Mario Liu [phone]. Mario Liu states that patient has lived at group home for 8 or 9 years. She states that she expects patient to return to group home at discharge. Guardian asked for therapy to look at patient. CSW explained that patient has orders for PT eval. CSW will continue to follow for discharge planning.   Employment status:  Disabled (Comment on whether or not currently receiving Disability) Insurance information:  Medicare PT Recommendations:  Not assessed at this time Information / Referral to community resources:     Patient/Family's Response to care:  Patient lives in a group home   Patient/Family's Understanding of and Emotional Response to  Diagnosis, Current Treatment, and Prognosis:  Sister is guardian and she is Patent attorney of CSW assistance   Emotional Assessment Appearance:  Appears stated age Attitude/Demeanor/Rapport:  Unable to Assess Affect (typically observed):    Orientation:  Oriented to Self Alcohol / Substance use:  Not Applicable Psych involvement (Current and /or in the community):  No (Comment)  Discharge Needs  Concerns to be addressed:  Discharge Planning Concerns Readmission within the last 30 days:  No Current discharge risk:  None Barriers to Discharge:  Continued Medical Work up   [employer], [place] 03/02/2018, 3:03 PM

## 2018-03-02 NOTE — Progress Notes (Signed)
[facility] [place] NAME: Mario Liu    MR#:  [id number]  [place]:  1961/08/15  SUBJECTIVE:   Patient here due to recurrent falls, weakness.  Patient is mumbling presently and difficult to understand, no falls or acute events overnight.  REVIEW OF SYSTEMS:    Review of Systems  Unable to perform ROS: Mental acuity    Nutrition: 2 gm. sodium Tolerating Diet: Yes Tolerating PT: Await Eval   DRUG ALLERGIES:  No Known Allergies  VITALS:  Blood pressure 119/81, pulse 61, temperature 97.7 F (36.5 C), temperature source Axillary, resp. rate 18, height 5\' 6"  (1.676 m), weight 87.4 kg (192 lb 9.6 oz), SpO2 94 %.  PHYSICAL EXAMINATION:   Physical Exam  GENERAL:  57 y.o.-year-old patient lying in bed in NAD.  EYES: Pupils equal, round, reactive to light and accommodation. No scleral icterus. Extraocular muscles intact.  HEENT: Head atraumatic, normocephalic. Oropharynx and nasopharynx clear.  NECK:  Supple, no jugular venous distention. No thyroid enlargement, no tenderness.  LUNGS: Normal breath sounds bilaterally, no wheezing, rales, rhonchi. No use of accessory muscles of respiration.  CARDIOVASCULAR: S1, S2 normal. No murmurs, rubs, or gallops.  ABDOMEN: Soft, nontender, nondistended. Bowel sounds present. No organomegaly or mass.  EXTREMITIES: No cyanosis, clubbing or edema b/l.    NEUROLOGIC: Cranial nerves II through XII are intact. No focal Motor or sensory deficits b/l. Globally weak.  PSYCHIATRIC: The patient is alert and oriented x 3.  SKIN: No obvious rash, lesion, or ulcer.    LABORATORY PANEL:   CBC Recent Labs  Lab 03/02/18 0503  WBC 4.1  HGB 12.5*  HCT 37.9*  PLT 83*   ------------------------------------------------------------------------------------------------------------------  Chemistries  Recent Labs  Lab 03/01/18 1847 03/02/18 0503  NA 138 138  K 3.9 3.6  CL 105 108  CO2 26 23  GLUCOSE  85 100*  BUN 16 13  CREATININE 1.11 0.96  CALCIUM 9.4 8.1*  AST 25  --   ALT 21  --   ALKPHOS 47  --   BILITOT 0.8  --    ------------------------------------------------------------------------------------------------------------------  Cardiac Enzymes Recent Labs  Lab 03/01/18 1847  TROPONINI <0.03   ------------------------------------------------------------------------------------------------------------------  RADIOLOGY:  Dg Knee 2 Views Left  Result Date: 03/01/2018 CLINICAL DATA:  Left leg pain after fall today. EXAM: LEFT KNEE - 1-2 VIEW COMPARISON:  None. FINDINGS: No evidence of fracture, dislocation, or joint effusion. Mild narrowing of medial joint space is noted. Soft tissues are unremarkable. IMPRESSION: Mild degenerative joint disease is noted medially. No acute abnormality seen in the left knee. Electronically Signed   By: Marijo Conception, M.D.   On: 03/01/2018 20:45   Dg Tibia/fibula Left  Result Date: 03/01/2018 CLINICAL DATA:  Left leg pain after multiple falls today. EXAM: LEFT TIBIA AND FIBULA - 2 VIEW COMPARISON:  None. FINDINGS: There is no evidence of fracture or other focal bone lesions. Soft tissues are unremarkable. IMPRESSION: Normal left tibia and fibula. Electronically Signed   By: Marijo Conception, M.D.   On: 03/01/2018 20:47   Ct Head Wo Contrast  Result Date: 03/01/2018 CLINICAL DATA:  Generalized weakness and slurred speech EXAM: CT HEAD WITHOUT CONTRAST TECHNIQUE: Contiguous axial images were obtained from the base of the skull through the vertex without intravenous contrast. COMPARISON:  05/14/2017 FINDINGS: Brain: There are encephalomalacia changes in the distribution of the left MCA consistent with the prior infarct. No findings to suggest acute hemorrhage, acute infarction  or space-occupying mass lesion are noted. Vascular: No hyperdense vessel or unexpected calcification. Skull: Normal. Negative for fracture or focal lesion. Sinuses/Orbits: Mild  mucosal changes are noted within the paranasal sinuses stable from the prior exams. Other: None. IMPRESSION: Changes of prior left MCA infarct with encephalomalacia change. No acute abnormality is noted. Electronically Signed   By: Inez Catalina M.D.   On: 03/01/2018 20:29   Mr Brain Wo Contrast  Result Date: 03/02/2018 CLINICAL DATA:  Altered mental status, generalized weakness. Multiple falls today. History of stroke, down syndrome. EXAM: MRI HEAD WITHOUT CONTRAST TECHNIQUE: Multiplanar, multiecho pulse sequences of the brain and surrounding structures were obtained without intravenous contrast. COMPARISON:  CT HEAD March 01, 2018 and MRI of the head October 16, 2016 FINDINGS: Moderately motion degraded examination. INTRACRANIAL CONTENTS: No reduced diffusion to suggest acute ischemia or hyperacute demyelination. LEFT temporoparietal encephalomalacia. No susceptibility artifact with mineralization to suggest hemorrhage. Ex vacuo dilatation LEFT lateral ventricle. No hydrocephalus or advanced parenchymal brain volume loss. No midline shift, mass effect or masses. No abnormal extra-axial fluid collections. VASCULAR: Normal major intracranial vascular flow voids present at skull base. SKULL AND UPPER CERVICAL SPINE: No abnormal sellar expansion. No suspicious calvarial bone marrow signal. Craniocervical junction maintained. SINUSES/ORBITS: LEFT maxillary mucosal retention cyst. Mucosal thickening effacing the nasal vestibule. Mild paranasal sinus mucosal thickening.The included ocular globes and orbital contents are non-suspicious. OTHER: None. IMPRESSION: 1. No acute intracranial process on this moderately motion degraded examination. 2. Old LEFT MCA territory infarct. 3. Abnormally nasal cavity mucosal thickening, recommend direct inspection. Electronically Signed   By: Elon Alas M.D.   On: 03/02/2018 01:50   Dg Chest Portable 1 View  Result Date: 03/01/2018 CLINICAL DATA:  Generalized weakness and  slurred speech EXAM: PORTABLE CHEST 1 VIEW COMPARISON:  None. FINDINGS: Cardiac shadow is within normal limits. Mild bibasilar atelectatic changes are noted. No other focal abnormality is seen. IMPRESSION: Mild bibasilar atelectasis. Electronically Signed   By: Inez Catalina M.D.   On: 03/01/2018 19:15     ASSESSMENT AND PLAN:   57 year old male with past medical history of COPD, hypothyroidism, depression, history of previous CVA, hepatitis C who presents to the hospital due to generalized weakness and recurrent falls.  1.  Altered mental status/generalized weakness- possible due to UTI based off UA today.   Patient underwent an extensive neurologic work-up including CT head, MRI of the brain which is negative. -Patient is afebrile and hemodynamically stable.  Cont. IV Ceftriaxone. - Await further neurology input, await physical therapy evaluation.  Await urine drug screen.  2.  Recurrent falls- await physical therapy evaluation.  3.  COPD-no acute exacerbation-continue albuterol nebulizers  4.  UTI-continue IV ceftriaxone, follow urine cultures.  5.  History of seizures- continue Vimpat.  6.  History of depression-continue Wellbutrin, Celexa.  7.  Hyperlipidemia-continue atorvastatin.  Await PT eval.    All the records are reviewed and case discussed with Care Management/Social Worker. Management plans discussed with the patient, family and they are in agreement.  CODE STATUS: Full code  DVT Prophylaxis: Lovenox  TOTAL TIME TAKING CARE OF THIS PATIENT: 30 minutes.   POSSIBLE D/C IN 1-2 DAYS, DEPENDING ON CLINICAL CONDITION.   Henreitta Leber M.D on 03/02/2018 at 1:48 PM  Between 7am to 6pm - Pager - [phone]  After 6pm go to [email or web address] - Proofreader  [facility]  Office  [phone]  CC: Primary care physician; [name], FNP

## 2018-03-02 NOTE — Consult Note (Addendum)
Reason for Consult: Frequent falls Referring Physician: Verdell Carmine  CC: Frequent falls  HPI: Mario Liu is an 57 y.o. male who has history of a stroke approximately 1 year ago.  History is very difficult to obtain.  I have spoken with the patient, his guardian who is his sister and with facility care workers.  It appears that the patient has been having progressive problems with balance since his stroke approximately 1 year ago.  It is noted that he had multiple falls on the day of admission.  From talking to the staff though they can only describe that he was sitting on the arm of a couch and slid off the arm.  He has not been doing particularly well at his day program.  He is no longer able to do the chores that they would have him do.  They feel that he needs a rest after his day at the day program because he is too fatigued to return and perform the next day.  At his living facility they do not they have never had him do any chores and do not feel there has been a big change in his functional status other than the fact that his balance has been somewhat worse lately.  Past Medical History:  Diagnosis Date  . COPD (chronic obstructive pulmonary disease) ([place])   . Hepatitis C   . Hx of cholecystectomy   . Hypothyroidism   . Kidney cysts   . Major depressive disorder    Was treated at facility in [place] ([facility]) years ago  . Stroke ([place])   . Tobacco abuse   . Vitamin D deficiency     Past Surgical History:  Procedure Laterality Date  . LAPAROSCOPIC CHOLECYSTECTOMY      Family History  Adopted: Yes  Problem Relation Age of Onset  . Diabetes Maternal Grandmother     Social History:  reports that he has been smoking cigarettes.  He has a 19.50 pack-year smoking history. He has never used smokeless tobacco. He reports that he does not drink alcohol or use drugs.  No Known Allergies  Medications:  I have reviewed the patient's current medications. Prior to Admission:   Facility-Administered Medications Prior to Admission  Medication Dose Route Frequency Provider Last Rate Last Dose  . lacosamide (VIMPAT) tablet 200 mg  200 mg Oral BID Garvin Fila, MD       Medications Prior to Admission  Medication Sig Dispense Refill Last Dose  . albuterol (PROVENTIL HFA;VENTOLIN HFA) 108 (90 Base) MCG/ACT inhaler Inhale into the lungs every 6 (six) hours as needed for wheezing or shortness of breath.   Taking  . alendronate (FOSAMAX) 70 MG tablet Take 70 mg by mouth once a week. Fridays   Taking  . aspirin 325 MG tablet Take 325 mg daily by mouth.   Taking  . atorvastatin (LIPITOR) 40 MG tablet Take 40 mg daily by mouth.   Taking  . buPROPion (WELLBUTRIN XL) 150 MG 24 hr tablet Take 150 mg by mouth daily.    Taking  . calcium carbonate (OS-CAL) 600 MG TABS tablet Take 600 mg by mouth 2 (two) times daily with a meal.    Taking  . citalopram (CELEXA) 40 MG tablet Take 40 mg by mouth daily.    Taking  . clonazePAM (KLONOPIN) 0.5 MG tablet Take by mouth.   Taking  . clopidogrel (PLAVIX) 75 MG tablet Take 75 mg daily by mouth.   Taking  . fluticasone (FLONASE)  50 MCG/ACT nasal spray Place 2 sprays daily into both nostrils.   Taking  . guaiFENesin-dextromethorphan (ROBITUSSIN DM) 100-10 MG/5ML syrup Take by mouth.   Taking  . loratadine (CLARITIN) 10 MG tablet Take 10 mg by mouth daily.   Taking  . polyethylene glycol (MIRALAX / GLYCOLAX) packet Take 17 g by mouth daily as needed.   Taking  . potassium chloride (K-DUR) 10 MEQ tablet Take 10 mEq by mouth daily.    Taking  . prochlorperazine (COMPAZINE) 5 MG tablet Take 5 mg by mouth every 6 (six) hours as needed for nausea or vomiting.   Taking  . risperiDONE (RISPERDAL) 3 MG tablet Take 3 mg by mouth 2 (two) times daily.   Taking  . traZODone (DESYREL) 50 MG tablet Take 0.5-1 tablets (25-50 mg total) by mouth at bedtime as needed for sleep.   Taking   Scheduled: . aspirin EC  325 mg Oral Daily  . atorvastatin  40 mg  Oral Daily  . buPROPion  150 mg Oral Daily  . calcium carbonate  1,500 mg Oral BID WC  . citalopram  40 mg Oral Daily  . clopidogrel  75 mg Oral Daily  . enoxaparin (LOVENOX) injection  40 mg Subcutaneous Q24H  . fluticasone  2 spray Each Nare Daily  . lacosamide  200 mg Oral BID  . loratadine  10 mg Oral Daily  . risperiDONE  3 mg Oral BID    ROS: History obtained from care providers  General ROS: negative for - chills, fatigue, fever, night sweats, weight gain or weight loss Psychological ROS: negative for - behavioral disorder, hallucinations, memory difficulties, mood swings or suicidal ideation Ophthalmic ROS: negative for - blurry vision, double vision, eye pain or loss of vision ENT ROS: negative for - epistaxis, nasal discharge, oral lesions, sore throat, tinnitus or vertigo Allergy and Immunology ROS: negative for - hives or itchy/watery eyes Hematological and Lymphatic ROS: negative for - bleeding problems, bruising or swollen lymph nodes Endocrine ROS: negative for - galactorrhea, hair pattern changes, polydipsia/polyuria or temperature intolerance Respiratory ROS: negative for - cough, hemoptysis, shortness of breath or wheezing Cardiovascular ROS: negative for - chest pain, dyspnea on exertion, edema or irregular heartbeat Gastrointestinal ROS: negative for - abdominal pain, diarrhea, hematemesis, nausea/vomiting or stool incontinence Genito-Urinary ROS: negative for - dysuria, hematuria, incontinence or urinary frequency/urgency Musculoskeletal ROS: negative for - joint swelling or muscular weakness Neurological ROS: as noted in HPI Dermatological ROS: negative for rash and skin lesion changes  Physical Examination: Blood pressure 119/81, pulse 61, temperature 97.7 F (36.5 C), temperature source Axillary, resp. rate 18, height 5\' 6"  (1.676 m), weight 87.4 kg (192 lb 9.6 oz), SpO2 94 %.  HEENT-  Normocephalic, no lesions, without obvious abnormality.  Normal external  eye and conjunctiva.  Normal TM's bilaterally.  Normal auditory canals and external ears. Normal external nose, mucus membranes and septum.  Normal pharynx. Cardiovascular- S1, S2 normal, pulses palpable throughout   Lungs- chest clear, no wheezing, rales, normal symmetric air entry Abdomen- soft, non-tender; bowel sounds normal; no masses,  no organomegaly Extremities- no edema Lymph-no adenopathy palpable Musculoskeletal-no joint tenderness, deformity or swelling Skin-warm and dry, no hyperpigmentation, vitiligo, or suspicious lesions  Neurological Examination   Mental Status: Patient is lethargic but can be aroused.  His speech is dysarthric but fluent.  He has significant difficulties following three-step commands even with reinforcement.  He is able to follow simple commands. Cranial Nerves: II: Discs flat bilaterally; Visual fields grossly  normal, pupils equal, round, reactive to light and accommodation III,IV, VI: ptosis not present, extra-ocular motions intact bilaterally V,VII: Right facial droop, facial light touch sensation normal bilaterally VIII: hearing normal bilaterally IX,X: gag reflex present XI: bilateral shoulder shrug XII: midline tongue extension Motor: Right : Upper extremity   5-/5    Left:     Upper extremity   5/5  Lower extremity   4/5     Lower extremity   5-/5 Tone and bulk:normal tone throughout; no atrophy noted Sensory: Pinprick and light touch intact throughout, bilaterally Deep Tendon Reflexes: 2+ and symmetric with absent ankle jerks bilaterally Plantars: Right: Mute   left: Mute Cerebellar: Unable to perform due to ability to follow commands Gait: Not tested due to safety concerns   Laboratory Studies:   Basic Metabolic Panel: Recent Labs  Lab 03/01/18 1847 03/02/18 0503  NA 138 138  K 3.9 3.6  CL 105 108  CO2 26 23  GLUCOSE 85 100*  BUN 16 13  CREATININE 1.11 0.96  CALCIUM 9.4 8.1*    Liver Function Tests: Recent Labs  Lab  03/01/18 1847  AST 25  ALT 21  ALKPHOS 47  BILITOT 0.8  PROT 7.2  ALBUMIN 3.8   No results for input(s): LIPASE, AMYLASE in the last 168 hours. Recent Labs  Lab 03/01/18 2158  AMMONIA 15    CBC: Recent Labs  Lab 03/01/18 1847 03/02/18 0503  WBC 5.0 4.1  NEUTROABS 1.9  --   HGB 13.8 12.5*  HCT 40.7 37.9*  MCV 85.7 87.1  PLT 96* 83*    Cardiac Enzymes: Recent Labs  Lab 03/01/18 1847  TROPONINI <0.03    BNP: Invalid input(s): POCBNP  CBG: No results for input(s): GLUCAP in the last 168 hours.  Microbiology: Results for orders placed or performed during the hospital encounter of 03/01/18  MRSA PCR Screening     Status: None   Collection Time: 03/01/18 10:43 PM  Result Value Ref Range Status   MRSA by PCR NEGATIVE NEGATIVE Final    Comment:        The GeneXpert MRSA Assay (FDA approved for NASAL specimens only), is one component of a comprehensive MRSA colonization surveillance program. It is not intended to diagnose MRSA infection nor to guide or monitor treatment for MRSA infections. Performed at [facility], [address] [place], [place] [address]     Coagulation Studies: No results for input(s): LABPROT, INR in the last 72 hours.  Urinalysis:  Recent Labs  Lab 03/01/18 1848  COLORURINE YELLOW*  LABSPEC 1.015  PHURINE 6.0  GLUCOSEU NEGATIVE  HGBUR SMALL*  BILIRUBINUR NEGATIVE  KETONESUR NEGATIVE  PROTEINUR NEGATIVE  NITRITE POSITIVE*  LEUKOCYTESUR TRACE*    Lipid Panel:     Component Value Date/Time   CHOL 138 05/15/2017 0433   TRIG 125 05/15/2017 0433   HDL 33 (L) 05/15/2017 0433   CHOLHDL 4.2 05/15/2017 0433   VLDL 25 05/15/2017 0433   LDLCALC 80 05/15/2017 0433    HgbA1C:  Lab Results  Component Value Date   HGBA1C 5.9 (H) 05/15/2017    Urine Drug Screen:      Component Value Date/Time   LABOPIA NONE DETECTED 03/01/2018 1848   COCAINSCRNUR NONE DETECTED 03/01/2018 1848   LABBENZ NONE DETECTED  03/01/2018 1848   AMPHETMU NONE DETECTED 03/01/2018 1848   THCU NONE DETECTED 03/01/2018 1848   LABBARB (A) 03/01/2018 1848    Result not available. Reagent lot number recalled by manufacturer.  Alcohol Level:  Recent Labs  Lab 03/01/18 1847  ETH <10    Other results: EKG: Normal sinus rhythm at 72 bpm.  Imaging: Dg Knee 2 Views Left  Result Date: 03/01/2018 CLINICAL DATA:  Left leg pain after fall today. EXAM: LEFT KNEE - 1-2 VIEW COMPARISON:  None. FINDINGS: No evidence of fracture, dislocation, or joint effusion. Mild narrowing of medial joint space is noted. Soft tissues are unremarkable. IMPRESSION: Mild degenerative joint disease is noted medially. No acute abnormality seen in the left knee. Electronically Signed   By: Marijo Conception, M.D.   On: 03/01/2018 20:45   Dg Tibia/fibula Left  Result Date: 03/01/2018 CLINICAL DATA:  Left leg pain after multiple falls today. EXAM: LEFT TIBIA AND FIBULA - 2 VIEW COMPARISON:  None. FINDINGS: There is no evidence of fracture or other focal bone lesions. Soft tissues are unremarkable. IMPRESSION: Normal left tibia and fibula. Electronically Signed   By: Marijo Conception, M.D.   On: 03/01/2018 20:47   Ct Head Wo Contrast  Result Date: 03/01/2018 CLINICAL DATA:  Generalized weakness and slurred speech EXAM: CT HEAD WITHOUT CONTRAST TECHNIQUE: Contiguous axial images were obtained from the base of the skull through the vertex without intravenous contrast. COMPARISON:  05/14/2017 FINDINGS: Brain: There are encephalomalacia changes in the distribution of the left MCA consistent with the prior infarct. No findings to suggest acute hemorrhage, acute infarction or space-occupying mass lesion are noted. Vascular: No hyperdense vessel or unexpected calcification. Skull: Normal. Negative for fracture or focal lesion. Sinuses/Orbits: Mild mucosal changes are noted within the paranasal sinuses stable from the prior exams. Other: None. IMPRESSION: Changes of  prior left MCA infarct with encephalomalacia change. No acute abnormality is noted. Electronically Signed   By: Inez Catalina M.D.   On: 03/01/2018 20:29   Mr Brain Wo Contrast  Result Date: 03/02/2018 CLINICAL DATA:  Altered mental status, generalized weakness. Multiple falls today. History of stroke, down syndrome. EXAM: MRI HEAD WITHOUT CONTRAST TECHNIQUE: Multiplanar, multiecho pulse sequences of the brain and surrounding structures were obtained without intravenous contrast. COMPARISON:  CT HEAD March 01, 2018 and MRI of the head October 16, 2016 FINDINGS: Moderately motion degraded examination. INTRACRANIAL CONTENTS: No reduced diffusion to suggest acute ischemia or hyperacute demyelination. LEFT temporoparietal encephalomalacia. No susceptibility artifact with mineralization to suggest hemorrhage. Ex vacuo dilatation LEFT lateral ventricle. No hydrocephalus or advanced parenchymal brain volume loss. No midline shift, mass effect or masses. No abnormal extra-axial fluid collections. VASCULAR: Normal major intracranial vascular flow voids present at skull base. SKULL AND UPPER CERVICAL SPINE: No abnormal sellar expansion. No suspicious calvarial bone marrow signal. Craniocervical junction maintained. SINUSES/ORBITS: LEFT maxillary mucosal retention cyst. Mucosal thickening effacing the nasal vestibule. Mild paranasal sinus mucosal thickening.The included ocular globes and orbital contents are non-suspicious. OTHER: None. IMPRESSION: 1. No acute intracranial process on this moderately motion degraded examination. 2. Old LEFT MCA territory infarct. 3. Abnormally nasal cavity mucosal thickening, recommend direct inspection. Electronically Signed   By: Elon Alas M.D.   On: 03/02/2018 01:50   Dg Chest Portable 1 View  Result Date: 03/01/2018 CLINICAL DATA:  Generalized weakness and slurred speech EXAM: PORTABLE CHEST 1 VIEW COMPARISON:  None. FINDINGS: Cardiac shadow is within normal limits. Mild  bibasilar atelectatic changes are noted. No other focal abnormality is seen. IMPRESSION: Mild bibasilar atelectasis. Electronically Signed   By: Inez Catalina M.D.   On: 03/01/2018 19:15     Assessment/Plan: 57 year old male with history of a stroke  and residual right hemiparesis.  History obtained today is difficult and varies depending on the source of the history.  It does appear though that the patient has had some decline in function recently.  MRI of the brain reviewed and shows no acute changes but does show his old left MCA territory infarct.  Lab work does reveal a urinary tract infection.  It is likely that his recent decline may be secondary to the urinary tract infection superimposed on his chronic deficits.  It also appears that the patient has not had any therapy for quite some time and has been less mobile which may be leading to some degree of decreased function as well.  Patient on aspirin and Plavix for stroke prophylaxis.  The patient is lethargic today.  This is somewhat concerning and although this may be secondary to the urinary tract infection the patient also is on Risperdal, Klonopin and Trazodone.  Recommendations: 1.  Agree with treatment of UTI 2.  If no improvement in lethargy with treatment of UTI would consider discontinuation of trazodone 3.  Patient to be evaluated by speech therapy, occupational therapy and physical therapy.  Patient may likely benefit from reinitiation of the services. 4. Continue aspirin and Plavix 5.  Patient to continue follow-up with neurology on an outpatient basis.  Alexis Goodell, MD Neurology [phone] 03/02/2018, 2:17 PM

## 2018-03-02 NOTE — Progress Notes (Signed)
Patient admitted d/t recurrent falls. Patient ordered VTE prophylaxis w/ lovenox 40 mg subq bid. Patient's BMI is not > 40, will readjust to lovenox 40 mg subq daily.  Plts on admission 96 (126 on 02/20 last CBC) will continue to monitor.  Tobie Lords, PharmD, BCPS Clinical Pharmacist 03/02/2018

## 2018-03-02 NOTE — Care Management (Signed)
Telephone call to legal guardian [name] [phone]). Discussed admitted status of observation and [place] letter.  Shelbie Ammons RN MSN CCM Care Management [phone]

## 2018-03-02 NOTE — Progress Notes (Signed)
Assisted patient to stand at bedside to use urinal at my encouragement. Patient stated he did not need to urinate. Bladder scanned patient: 355ml max volume at this time.

## 2018-03-02 NOTE — Evaluation (Signed)
Physical Therapy Evaluation Patient Details Name: Mario Liu MRN: [id number] DOB: 19-Mar-1961 Today's Date: 03/02/2018   History of Present Illness  pt presents to ED on 03/01/18 with general weakness, slurred speech, and repetitive falls. Imaging reveals no acute processes however demonstrates an old MCA infarct. Imaging of L knee displays degenerative joint disease. Pt has a past medical history that includes down syndrome, COPD, hepatitis C, depression and a previous stroke on 05/14/17    Clinical Impression  Pt is a pleasant 57 year old male who was admitted for generalized weakness and repeated falls. Pt performs bed mobility, transfers, and ambulation with CGA to min assist. Pt demonstrates deficits with strength, mobility, and safety awareness. Pt requires repeated verbal cuing throughout session to increase safety. Pt displays some difficulty communicating which makes obtaining an accurate history difficult, however on 07/15/17 pt amb long distances with outpatient PT on uneven surfaces and supervision. Began amb using RW however discarded after many cues due to worsened safety. Pt amb 20' total, 10' without an AD and a chair follow requiring min guarding without any gross LOB. Pt tolerated treatment well. PT will continue to work with pt at least 2x/week while admitted. Due to pts current deficits pt could benefit from continued skilled therapy to work toward PLOF. D/c recommendation at this time is SNF. This entire session was guided, instructed, and directly supervised by Greggory Stallion, DPT.     Follow Up Recommendations SNF    Equipment Recommendations  None recommended by PT    Recommendations for Other Services       Precautions / Restrictions Precautions Precautions: None Restrictions Weight Bearing Restrictions: No      Mobility  Bed Mobility Overal bed mobility: Independent             General bed mobility comments: pt independent with bed mobility sit>supine. able  to perform with minimal cuing safely.  Transfers Overall transfer level: Needs assistance Equipment used: None Transfers: Sit to/from Stand Sit to Stand: Min assist         General transfer comment: pt min assist sit<>stand without AD. pt is very impulsive and had to be cued several times to increase safety by waiting for PT to assist with transfer. Pt transferred from bed to chair where he was assisted with using urinal. Pt too impulsive at moment to stand using urinal.  Ambulation/Gait Ambulation/Gait assistance: Min guard Gait Distance (Feet): 20 Feet Assistive device: None Gait Pattern/deviations: Wide base of support;Shuffle     General Gait Details: pt amb 20' with a chair follow, demonstrating gait with a wide BOS and shuffling gait. Attempted gait training using RW however this decreased pts safety, amb 10' without AD increased safety.  Stairs            Wheelchair Mobility    Modified Rankin (Stroke Patients Only)       Balance Overall balance assessment: Needs assistance   Sitting balance-Leahy Scale: Good Sitting balance - Comments: pt sits with feet supported flat on floor with no gross LOB.     Standing balance-Leahy Scale: Fair Standing balance comment: pt able to stand without UE and CGA for short periods without LOB                             Pertinent Vitals/Pain Pain Assessment: No/denies pain    Home Living Family/patient expects to be discharged to:: Group home  Additional Comments: difficult to obtain accurate history from pt due to communication difficulties. Pt states that he lives in a group home however unclear as to home layout and amount of assistance available. pt independent with all ADLs with supervision.    Prior Function Level of Independence: Needs assistance(difficult assessing full prior level of function)         Comments: amb long distances on uneven surfaces with supervision at  outpatient PT 07/15/17     Hand Dominance        Extremity/Trunk Assessment   Upper Extremity Assessment Upper Extremity Assessment: LUE deficits/detail;RUE deficits/detail RUE Deficits / Details: grossly at least 4-/5 including grip strength, equal bilateral. RUE Sensation: WNL LUE Deficits / Details: grossly at least 4-/5 including grip strength, equal bilateral. LUE Sensation: WNL    Lower Extremity Assessment Lower Extremity Assessment: RLE deficits/detail;LLE deficits/detail RLE Deficits / Details: grossly at least 4-/5 including grip strength, equal bilateral. RLE Sensation: WNL LLE Deficits / Details: grossly at least 4-/5 including grip strength, equal bilateral. LLE Sensation: WNL       Communication   Communication: Other (comment);Expressive difficulties(aphasia)  Cognition Arousal/Alertness: Awake/alert Behavior During Therapy: Impulsive Overall Cognitive Status: History of cognitive impairments - at baseline                                        General Comments      Exercises Other Exercises Other Exercises: pt instructed in ther-ex including LAQ, and seated marching requiring close supervision x15 B. Pt responded well do demonstration after being unreceptive to verbal cuing.   Assessment/Plan    PT Assessment Patient needs continued PT services  PT Problem List Decreased strength;Decreased range of motion;Decreased activity tolerance;Decreased balance;Decreased mobility;Decreased cognition;Decreased coordination;Decreased knowledge of use of DME;Decreased safety awareness       PT Treatment Interventions DME instruction;Gait training;Stair training;Functional mobility training;Therapeutic activities;Therapeutic exercise;Balance training;Patient/family education    PT Goals (Current goals can be found in the Care Plan section)  Acute Rehab PT Goals Patient Stated Goal: to go back to group home PT Goal Formulation: With patient Time  For Goal Achievement: 03/16/18 Potential to Achieve Goals: Good    Frequency Min 2X/week   Barriers to discharge        Co-evaluation               AM-PAC PT "6 Clicks" Daily Activity  Outcome Measure Difficulty turning over in bed (including adjusting bedclothes, sheets and blankets)?: A Little Difficulty moving from lying on back to sitting on the side of the bed? : A Little Difficulty sitting down on and standing up from a chair with arms (e.g., wheelchair, bedside commode, etc,.)?: Unable Help needed moving to and from a bed to chair (including a wheelchair)?: A Little Help needed walking in hospital room?: A Little Help needed climbing 3-5 steps with a railing? : A Lot 6 Click Score: 15    End of Session Equipment Utilized During Treatment: Gait belt Activity Tolerance: Patient tolerated treatment well;No increased pain Patient left: in chair;with call bell/phone within reach;with chair alarm set Nurse Communication: Other (comment);Mobility status(need for linen change due to incontinece episode.) PT Visit Diagnosis: Unsteadiness on feet (R26.81);Other abnormalities of gait and mobility (R26.89);Repeated falls (R29.6);Muscle weakness (generalized) (M62.81);History of falling (Z91.81);Difficulty in walking, not elsewhere classified (R26.2)    Time: 1421-1500 PT Time Calculation (min) (ACUTE ONLY): 39 min   Charges:  PT Evaluation $PT Eval Low Complexity: 1 Low PT Treatments $Gait Training: 8-22 mins $Therapeutic Exercise: 8-22 mins   PT G Codes:        General Wearing, SPT  Darien Mignogna 03/02/2018, 5:26 PM

## 2018-03-03 DIAGNOSIS — R296 Repeated falls: Secondary | ICD-10-CM | POA: Diagnosis not present

## 2018-03-03 DIAGNOSIS — R4182 Altered mental status, unspecified: Secondary | ICD-10-CM | POA: Diagnosis not present

## 2018-03-03 LAB — VITAMIN D 25 HYDROXY (VIT D DEFICIENCY, FRACTURES): Vit D, 25-Hydroxy: 33 ng/mL (ref 30.0–100.0)

## 2018-03-03 MED ORDER — VITAMIN D 1000 UNITS PO TABS: 1000.0000 [IU] | ORAL_TABLET | Freq: Every day | ORAL | Status: DC

## 2018-03-03 MED ORDER — CEFUROXIME AXETIL 250 MG PO TABS: 250.0000 mg | ORAL_TABLET | Freq: Two times a day (BID) | ORAL | 0 refills | Status: AC

## 2018-03-03 MED ORDER — BISACODYL 10 MG RE SUPP
10.0000 mg | Freq: Once | RECTAL | Status: AC
  Administered 2018-03-03: 10 mg via RECTAL
  Filled 2018-03-03: qty 1

## 2018-03-03 MED ORDER — CHOLECALCIFEROL 25 MCG (1000 UT) PO TABS: 1000.0000 [IU] | ORAL_TABLET | Freq: Every day | ORAL | 1 refills | Status: AC

## 2018-03-03 NOTE — Care Management (Signed)
Discharge to home today per Dr. Verdell Carmine. [name] resides at [facility] ([place]  No preference of agency. Telephone call to Floydene Flock, [place] representative updated. Shelbie Ammons RN MSN CCM Care Management [phone]

## 2018-03-03 NOTE — Progress Notes (Signed)
Pt has been discharged back to group home. Discharge papers given and explained to Tawana Scale, Administrator of group home. Verbalized understanding. Meds and appointments reviewed. RX given.

## 2018-03-03 NOTE — Clinical Social Work Note (Signed)
CSW found through chart review that PT recommends SNF for patient. CSW contacted Janetta Hora, M[facility] at [facility] [employer] [phone] to discuss level of functioning. [facility]celline Mates states that they can accept patient back to facility with home health rather than sending him to SNF. CSW notified RN CM of above. CSW will continue to follow for discharge planning.   [place], [place]

## 2018-03-03 NOTE — Plan of Care (Signed)
  Problem: Education: Goal: Knowledge of General Education information will improve Outcome: Progressing   Problem: Health Behavior/Discharge Planning: Goal: Ability to manage health-related needs will improve Outcome: Progressing   Problem: Clinical Measurements: Goal: Ability to maintain clinical measurements within normal limits will improve Outcome: Progressing Goal: Diagnostic test results will improve Outcome: Progressing Goal: Cardiovascular complication will be avoided Outcome: Progressing   Problem: Elimination: Goal: Will not experience complications related to urinary retention Outcome: Progressing   Problem: Safety: Goal: Ability to remain free from injury will improve Outcome: Progressing   Problem: Skin Integrity: Goal: Risk for impaired skin integrity will decrease Outcome: Progressing   Problem: Clinical Measurements: Goal: Respiratory complications will improve Outcome: Completed/Met

## 2018-03-03 NOTE — Discharge Summary (Addendum)
[facility] [place] NAME: Mario Liu    MR#:  [id number]  DATE OF BIRTH:  11-19-1960  DATE OF ADMISSION:  03/01/2018 ADMITTING PHYSICIAN: Dustin Flock, MD  DATE OF DISCHARGE: 03/03/2018  PRIMARY CARE PHYSICIAN: [name], FNP    ADMISSION DIAGNOSIS:  Weakness [R53.1] Seizure disorder ([place]) [G40.909] Dysarthria [R47.1]  DISCHARGE DIAGNOSIS:  Active Problems:   Recurrent falls   SECONDARY DIAGNOSIS:   Past Medical History:  Diagnosis Date  . COPD (chronic obstructive pulmonary disease) ([place])   . Hepatitis C   . Hx of cholecystectomy   . Hypothyroidism   . Kidney cysts   . Major depressive disorder    Was treated at facility in [place] [facility] years ago  . Stroke ([place])   . Tobacco abuse   . Vitamin D deficiency     HOSPITAL COURSE:   57 year old male with past medical history of COPD, hypothyroidism, depression, history of previous CVA, hepatitis C who presents to the hospital due to generalized weakness and recurrent falls.  1.  Altered mental status/generalized weakness- secondary to underlying UTI combined with patient's cognitive issues. -Patient was treated with IV ceftriaxone for the UTI, maintained on his antiepileptics and of her psychiatric meds and has improved and his mental status is now back to baseline.  Patient was seen by neurology who did not recommend any further work-up or intervention.  Patient neurologic work-up including CT head, MRI of the brain was negative for acute pathology.  Neurology commended continuing his aspirin, Plavix and follow-up with neurology as an outpatient.    2.  Recurrent falls- he was seen by physical therapy and initially they recommended short-term rehab but the group home is willing to take the patient back with home health services.  Will arrange home health PT and RN upon discharge.  3.  COPD-no acute exacerbation -Patient will continue his albuterol inhaler.  4.   UTI- was treated with IV ceftriaxone, now being discharged on oral Ceftin.  Urine cultures are currently pending.  Patient is clinically afebrile and hemodynamically stable.   5.  History of seizures- pt. Will continue Vimpat.  6.  History of depression- pt. Will continue Wellbutrin, Celexa.  7.  Hyperlipidemia- pt. Will continue atorvastatin    DISCHARGE CONDITIONS:   Stable  CONSULTS OBTAINED:  Treatment Team:  Alexis Goodell, MD  DRUG ALLERGIES:  No Known Allergies  DISCHARGE MEDICATIONS:   Allergies as of 03/03/2018   No Known Allergies     Medication List    TAKE these medications   albuterol 108 (90 Base) MCG/ACT inhaler Commonly known as:  PROVENTIL HFA;VENTOLIN HFA Inhale into the lungs every 6 (six) hours as needed for wheezing or shortness of breath.   alendronate 70 MG tablet Commonly known as:  FOSAMAX Take 70 mg by mouth once a week. Fridays   aspirin 325 MG tablet Take 325 mg daily by mouth.   atorvastatin 40 MG tablet Commonly known as:  LIPITOR Take 40 mg daily by mouth.   buPROPion 150 MG 24 hr tablet Commonly known as:  WELLBUTRIN XL Take 150 mg by mouth daily.   calcium carbonate 600 MG Tabs tablet Commonly known as:  OS-CAL Take 600 mg by mouth 2 (two) times daily with a meal.   cefUROXime 250 MG tablet Commonly known as:  CEFTIN Take 1 tablet (250 mg total) by mouth 2 (two) times daily with a meal for 5 days.   Cholecalciferol  1000 units tablet Take 1 tablet (1,000 Units total) by mouth daily. Start taking on:  03/04/2018   citalopram 40 MG tablet Commonly known as:  CELEXA Take 40 mg by mouth daily.   clonazePAM 0.5 MG tablet Commonly known as:  KLONOPIN Take 0.5 mg by mouth daily.   clopidogrel 75 MG tablet Commonly known as:  PLAVIX Take 75 mg daily by mouth.   fluticasone 50 MCG/ACT nasal spray Commonly known as:  FLONASE Place 1 spray into both nostrils daily.   GERI-LANTA 200-200-20 MG/5ML suspension Generic  drug:  alum & mag hydroxide-simeth Take 15 mLs by mouth every 6 (six) hours as needed for indigestion or heartburn.   lacosamide 200 MG Tabs tablet Commonly known as:  VIMPAT Take 200 mg by mouth 2 (two) times daily.   loratadine 10 MG tablet Commonly known as:  CLARITIN Take 10 mg by mouth daily.   polyethylene glycol packet Commonly known as:  MIRALAX / GLYCOLAX Take 17 g by mouth daily as needed.   potassium chloride 10 MEQ tablet Commonly known as:  K-DUR Take 10 mEq by mouth daily.   prochlorperazine 5 MG tablet Commonly known as:  COMPAZINE Take 5 mg by mouth every 6 (six) hours as needed for nausea or vomiting.   risperidone 4 MG tablet Commonly known as:  RISPERDAL Take 4 mg by mouth at bedtime.   traZODone 50 MG tablet Commonly known as:  DESYREL Take 0.5-1 tablets (25-50 mg total) by mouth at bedtime as needed for sleep. What changed:    how much to take  when to take this         DISCHARGE INSTRUCTIONS:   DIET:  Cardiac diet  DISCHARGE CONDITION:  Stable  ACTIVITY:  Activity as tolerated  OXYGEN:  Home Oxygen: No.   Oxygen Delivery: room air  DISCHARGE LOCATION:  group home with Home Health PT  If you experience worsening of your admission symptoms, develop shortness of breath, life threatening emergency, suicidal or homicidal thoughts you must seek medical attention immediately by calling 911 or calling your MD immediately  if symptoms less severe.  You Must read complete instructions/literature along with all the possible adverse reactions/side effects for all the Medicines you take and that have been prescribed to you. Take any new Medicines after you have completely understood and accpet all the possible adverse reactions/side effects.   Please note  You were cared for by a hospitalist during your hospital stay. If you have any questions about your discharge medications or the care you received while you were in the hospital after you  are discharged, you can call the unit and asked to speak with the hospitalist on call if the hospitalist that took care of you is not available. Once you are discharged, your primary care physician will handle any further medical issues. Please note that NO REFILLS for any discharge medications will be authorized once you are discharged, as it is imperative that you return to your primary care physician (or establish a relationship with a primary care physician if you do not have one) for your aftercare needs so that they can reassess your need for medications and monitor your lab values.     Today   Acute events overnight, afebrile, seen by physical therapy and will arrange home health services at home.  Patient wants to go home.  VITAL SIGNS:  Blood pressure 124/86, pulse 65, temperature 97.6 F (36.4 C), temperature source Oral, resp. rate 18, height 5\' 6"  (  1.676 m), weight 87.4 kg (192 lb 9.6 oz), SpO2 100 %.  I/O:    Intake/Output Summary (Last 24 hours) at 03/03/2018 1231 Last data filed at 03/03/2018 0949 Gross per 24 hour  Intake 340 ml  Output 875 ml  Net -535 ml    PHYSICAL EXAMINATION:   GENERAL:  57 y.o.-year-old patient lying in bed in NAD.  EYES: Pupils equal, round, reactive to light and accommodation. No scleral icterus. Extraocular muscles intact.  HEENT: Head atraumatic, normocephalic. Oropharynx and nasopharynx clear.  NECK:  Supple, no jugular venous distention. No thyroid enlargement, no tenderness.  LUNGS: Normal breath sounds bilaterally, no wheezing, rales, rhonchi. No use of accessory muscles of respiration.  CARDIOVASCULAR: S1, S2 normal. No murmurs, rubs, or gallops.  ABDOMEN: Soft, nontender, nondistended. Bowel sounds present. No organomegaly or mass.  EXTREMITIES: No cyanosis, clubbing or edema b/l.    NEUROLOGIC: Cranial nerves II through XII are intact. No focal Motor or sensory deficits b/l. Globally weak.  PSYCHIATRIC: The patient is alert and  oriented x 2.  SKIN: No obvious rash, lesion, or ulcer.    DATA REVIEW:   CBC Recent Labs  Lab 03/02/18 0503  WBC 4.1  HGB 12.5*  HCT 37.9*  PLT 83*    Chemistries  Recent Labs  Lab 03/01/18 1847 03/02/18 0503  NA 138 138  K 3.9 3.6  CL 105 108  CO2 26 23  GLUCOSE 85 100*  BUN 16 13  CREATININE 1.11 0.96  CALCIUM 9.4 8.1*  AST 25  --   ALT 21  --   ALKPHOS 47  --   BILITOT 0.8  --     Cardiac Enzymes Recent Labs  Lab 03/01/18 1847  TROPONINI <0.03    Microbiology Results  Results for orders placed or performed during the hospital encounter of 03/01/18  MRSA PCR Screening     Status: None   Collection Time: 03/01/18 10:43 PM  Result Value Ref Range Status   MRSA by PCR NEGATIVE NEGATIVE Final    Comment:        The GeneXpert MRSA Assay (FDA approved for NASAL specimens only), is one component of a comprehensive MRSA colonization surveillance program. It is not intended to diagnose MRSA infection nor to guide or monitor treatment for MRSA infections. Performed at [facility], [address], [place], [place] [address]     RADIOLOGY:  Dg Knee 2 Views Left  Result Date: 03/01/2018 CLINICAL DATA:  Left leg pain after fall today. EXAM: LEFT KNEE - 1-2 VIEW COMPARISON:  None. FINDINGS: No evidence of fracture, dislocation, or joint effusion. Mild narrowing of medial joint space is noted. Soft tissues are unremarkable. IMPRESSION: Mild degenerative joint disease is noted medially. No acute abnormality seen in the left knee. Electronically Signed   By: Marijo Conception, M.D.   On: 03/01/2018 20:45   Dg Tibia/fibula Left  Result Date: 03/01/2018 CLINICAL DATA:  Left leg pain after multiple falls today. EXAM: LEFT TIBIA AND FIBULA - 2 VIEW COMPARISON:  None. FINDINGS: There is no evidence of fracture or other focal bone lesions. Soft tissues are unremarkable. IMPRESSION: Normal left tibia and fibula. Electronically Signed   By: Marijo Conception, M.D.    On: 03/01/2018 20:47   Ct Head Wo Contrast  Result Date: 03/01/2018 CLINICAL DATA:  Generalized weakness and slurred speech EXAM: CT HEAD WITHOUT CONTRAST TECHNIQUE: Contiguous axial images were obtained from the base of the skull through the vertex without intravenous contrast.  COMPARISON:  05/14/2017 FINDINGS: Brain: There are encephalomalacia changes in the distribution of the left MCA consistent with the prior infarct. No findings to suggest acute hemorrhage, acute infarction or space-occupying mass lesion are noted. Vascular: No hyperdense vessel or unexpected calcification. Skull: Normal. Negative for fracture or focal lesion. Sinuses/Orbits: Mild mucosal changes are noted within the paranasal sinuses stable from the prior exams. Other: None. IMPRESSION: Changes of prior left MCA infarct with encephalomalacia change. No acute abnormality is noted. Electronically Signed   By: Inez Catalina M.D.   On: 03/01/2018 20:29   Mr Brain Wo Contrast  Result Date: 03/02/2018 CLINICAL DATA:  Altered mental status, generalized weakness. Multiple falls today. History of stroke, down syndrome. EXAM: MRI HEAD WITHOUT CONTRAST TECHNIQUE: Multiplanar, multiecho pulse sequences of the brain and surrounding structures were obtained without intravenous contrast. COMPARISON:  CT HEAD March 01, 2018 and MRI of the head October 16, 2016 FINDINGS: Moderately motion degraded examination. INTRACRANIAL CONTENTS: No reduced diffusion to suggest acute ischemia or hyperacute demyelination. LEFT temporoparietal encephalomalacia. No susceptibility artifact with mineralization to suggest hemorrhage. Ex vacuo dilatation LEFT lateral ventricle. No hydrocephalus or advanced parenchymal brain volume loss. No midline shift, mass effect or masses. No abnormal extra-axial fluid collections. VASCULAR: Normal major intracranial vascular flow voids present at skull base. SKULL AND UPPER CERVICAL SPINE: No abnormal sellar expansion. No suspicious  calvarial bone marrow signal. Craniocervical junction maintained. SINUSES/ORBITS: LEFT maxillary mucosal retention cyst. Mucosal thickening effacing the nasal vestibule. Mild paranasal sinus mucosal thickening.The included ocular globes and orbital contents are non-suspicious. OTHER: None. IMPRESSION: 1. No acute intracranial process on this moderately motion degraded examination. 2. Old LEFT MCA territory infarct. 3. Abnormally nasal cavity mucosal thickening, recommend direct inspection. Electronically Signed   By: Elon Alas M.D.   On: 03/02/2018 01:50   Dg Chest Portable 1 View  Result Date: 03/01/2018 CLINICAL DATA:  Generalized weakness and slurred speech EXAM: PORTABLE CHEST 1 VIEW COMPARISON:  None. FINDINGS: Cardiac shadow is within normal limits. Mild bibasilar atelectatic changes are noted. No other focal abnormality is seen. IMPRESSION: Mild bibasilar atelectasis. Electronically Signed   By: Inez Catalina M.D.   On: 03/01/2018 19:15      Management plans discussed with the patient, family and they are in agreement.  CODE STATUS:     Code Status Orders  (From admission, onward)        Start     Ordered   03/01/18 2228  Full code  Continuous     03/01/18 2228     TOTAL TIME TAKING CARE OF THIS PATIENT: 40 minutes.    Henreitta Leber M.D on 03/03/2018 at 12:31 PM  Between 7am to 6pm - Pager - [phone]  After 6pm go to [email or web address] - Proofreader  [facility]  Office  [phone]  CC: Primary care physician; [name], FNP

## 2018-03-03 NOTE — Clinical Social Work Note (Signed)
Patient is medically ready for discharge back to group home today. CSW notified patient's guardian [name] [phone] of discharge. CSW also notified [facility] ([facility] and they are sending someone to pick up patient today.   [place], [place]

## 2018-03-03 NOTE — Progress Notes (Signed)
Subjective: Patient awake and alert.  Sitting in a chair.    Objective: Current vital signs: BP 124/86 (BP Location: Right Arm)   Pulse 65   Temp 97.6 F (36.4 C) (Oral)   Resp 18   Ht 5\' 6"  (1.676 m)   Wt 87.4 kg (192 lb 9.6 oz)   SpO2 100%   BMI 31.09 kg/m  Vital signs in last 24 hours: Temp:  [97.6 F (36.4 C)-98 F (36.7 C)] 97.6 F (36.4 C) (06/19 0909) Pulse Rate:  [65-76] 65 (06/19 0909) Resp:  [15-18] 18 (06/19 0909) BP: (106-124)/(75-87) 124/86 (06/19 0909) SpO2:  [97 %-100 %] 100 % (06/19 0909)  Intake/Output from previous day: 06/18 0701 - 06/19 0700 In: 580 [P.O.:480; IV Piggyback:100] Out: 400 [Urine:400] Intake/Output this shift: Total I/O In: -  Out: 725 [Urine:725] Nutritional status:  Diet Order           Diet - low sodium heart healthy        Diet 2 gram sodium Room service appropriate? Yes; Fluid consistency: Thin  Diet effective now          Neurologic Exam: Mental Status: Awake and alert.  His speech is dysarthric but fluent.  He is able to follow simple commands. Cranial Nerves: II: Discs flat bilaterally; Visual fields grossly normal, pupils equal, round, reactive to light and accommodation III,IV, VI: ptosis not present, extra-ocular motions intact bilaterally V,VII: Right facial droop, facial light touch sensation normal bilaterally VIII: hearing normal bilaterally IX,X: gag reflex present XI: bilateral shoulder shrug XII: midline tongue extension Motor: Right :  Upper extremity   5-/5                                     Left:     Upper extremity   5/5             Lower extremity   4/5                                                  Lower extremity   5-/5 Tone and bulk:normal tone throughout; no atrophy noted   Lab Results: Basic Metabolic Panel: Recent Labs  Lab 03/01/18 1847 03/02/18 0503  NA 138 138  K 3.9 3.6  CL 105 108  CO2 26 23  GLUCOSE 85 100*  BUN 16 13  CREATININE 1.11 0.96  CALCIUM 9.4 8.1*    Liver  Function Tests: Recent Labs  Lab 03/01/18 1847  AST 25  ALT 21  ALKPHOS 47  BILITOT 0.8  PROT 7.2  ALBUMIN 3.8   No results for input(s): LIPASE, AMYLASE in the last 168 hours. Recent Labs  Lab 03/01/18 2158  AMMONIA 15    CBC: Recent Labs  Lab 03/01/18 1847 03/02/18 0503  WBC 5.0 4.1  NEUTROABS 1.9  --   HGB 13.8 12.5*  HCT 40.7 37.9*  MCV 85.7 87.1  PLT 96* 83*    Cardiac Enzymes: Recent Labs  Lab 03/01/18 1847  TROPONINI <0.03    Lipid Panel: No results for input(s): CHOL, TRIG, HDL, CHOLHDL, VLDL, LDLCALC in the last 168 hours.  CBG: No results for input(s): GLUCAP in the last 168 hours.  Microbiology: Results for orders placed or performed during the hospital encounter of  03/01/18  MRSA PCR Screening     Status: None   Collection Time: 03/01/18 10:43 PM  Result Value Ref Range Status   MRSA by PCR NEGATIVE NEGATIVE Final    Comment:        The GeneXpert MRSA Assay (FDA approved for NASAL specimens only), is one component of a comprehensive MRSA colonization surveillance program. It is not intended to diagnose MRSA infection nor to guide or monitor treatment for MRSA infections. Performed at [facility], [address] [place], [place] [address]     Coagulation Studies: No results for input(s): LABPROT, INR in the last 72 hours.  Imaging: Dg Knee 2 Views Left  Result Date: 03/01/2018 CLINICAL DATA:  Left leg pain after fall today. EXAM: LEFT KNEE - 1-2 VIEW COMPARISON:  None. FINDINGS: No evidence of fracture, dislocation, or joint effusion. Mild narrowing of medial joint space is noted. Soft tissues are unremarkable. IMPRESSION: Mild degenerative joint disease is noted medially. No acute abnormality seen in the left knee. Electronically Signed   By: Marijo Conception, M.D.   On: 03/01/2018 20:45   Dg Tibia/fibula Left  Result Date: 03/01/2018 CLINICAL DATA:  Left leg pain after multiple falls today. EXAM: LEFT TIBIA AND FIBULA -  2 VIEW COMPARISON:  None. FINDINGS: There is no evidence of fracture or other focal bone lesions. Soft tissues are unremarkable. IMPRESSION: Normal left tibia and fibula. Electronically Signed   By: Marijo Conception, M.D.   On: 03/01/2018 20:47   Ct Head Wo Contrast  Result Date: 03/01/2018 CLINICAL DATA:  Generalized weakness and slurred speech EXAM: CT HEAD WITHOUT CONTRAST TECHNIQUE: Contiguous axial images were obtained from the base of the skull through the vertex without intravenous contrast. COMPARISON:  05/14/2017 FINDINGS: Brain: There are encephalomalacia changes in the distribution of the left MCA consistent with the prior infarct. No findings to suggest acute hemorrhage, acute infarction or space-occupying mass lesion are noted. Vascular: No hyperdense vessel or unexpected calcification. Skull: Normal. Negative for fracture or focal lesion. Sinuses/Orbits: Mild mucosal changes are noted within the paranasal sinuses stable from the prior exams. Other: None. IMPRESSION: Changes of prior left MCA infarct with encephalomalacia change. No acute abnormality is noted. Electronically Signed   By: Inez Catalina M.D.   On: 03/01/2018 20:29   Mr Brain Wo Contrast  Result Date: 03/02/2018 CLINICAL DATA:  Altered mental status, generalized weakness. Multiple falls today. History of stroke, down syndrome. EXAM: MRI HEAD WITHOUT CONTRAST TECHNIQUE: Multiplanar, multiecho pulse sequences of the brain and surrounding structures were obtained without intravenous contrast. COMPARISON:  CT HEAD March 01, 2018 and MRI of the head October 16, 2016 FINDINGS: Moderately motion degraded examination. INTRACRANIAL CONTENTS: No reduced diffusion to suggest acute ischemia or hyperacute demyelination. LEFT temporoparietal encephalomalacia. No susceptibility artifact with mineralization to suggest hemorrhage. Ex vacuo dilatation LEFT lateral ventricle. No hydrocephalus or advanced parenchymal brain volume loss. No midline shift,  mass effect or masses. No abnormal extra-axial fluid collections. VASCULAR: Normal major intracranial vascular flow voids present at skull base. SKULL AND UPPER CERVICAL SPINE: No abnormal sellar expansion. No suspicious calvarial bone marrow signal. Craniocervical junction maintained. SINUSES/ORBITS: LEFT maxillary mucosal retention cyst. Mucosal thickening effacing the nasal vestibule. Mild paranasal sinus mucosal thickening.The included ocular globes and orbital contents are non-suspicious. OTHER: None. IMPRESSION: 1. No acute intracranial process on this moderately motion degraded examination. 2. Old LEFT MCA territory infarct. 3. Abnormally nasal cavity mucosal thickening, recommend direct inspection. Electronically Signed   By:  Elon Alas M.D.   On: 03/02/2018 01:50   Dg Chest Portable 1 View  Result Date: 03/01/2018 CLINICAL DATA:  Generalized weakness and slurred speech EXAM: PORTABLE CHEST 1 VIEW COMPARISON:  None. FINDINGS: Cardiac shadow is within normal limits. Mild bibasilar atelectatic changes are noted. No other focal abnormality is seen. IMPRESSION: Mild bibasilar atelectasis. Electronically Signed   By: Inez Catalina M.D.   On: 03/01/2018 19:15    Medications:  I have reviewed the patient's current medications. Scheduled: . aspirin EC  325 mg Oral Daily  . atorvastatin  40 mg Oral Daily  . bisacodyl  10 mg Rectal Once  . buPROPion  150 mg Oral Daily  . calcium carbonate  1,500 mg Oral BID WC  . [START ON 03/04/2018] cholecalciferol  1,000 Units Oral Daily  . citalopram  40 mg Oral Daily  . clopidogrel  75 mg Oral Daily  . enoxaparin (LOVENOX) injection  40 mg Subcutaneous Q24H  . fluticasone  2 spray Each Nare Daily  . lacosamide  200 mg Oral BID  . loratadine  10 mg Oral Daily  . risperiDONE  3 mg Oral BID    Assessment/Plan: Patient improved today.  No longer lethargic.  Appears at baseline.    Recommendations: 1.  Continue ASA and Plavix  No further neurologic  intervention is recommended at this time.  If further questions arise, please call or page at that time.  Thank you for allowing neurology to participate in the care of this patient.   LOS: 0 days   Alexis Goodell, MD Neurology [phone] 03/03/2018  12:41 PM

## 2018-03-04 LAB — URINE CULTURE: CULTURE: NO GROWTH

## 2018-03-08 ENCOUNTER — Other Ambulatory Visit: Payer: Self-pay

## 2018-03-08 ENCOUNTER — Emergency Department: Payer: Medicare Other

## 2018-03-08 ENCOUNTER — Encounter: Payer: Self-pay | Admitting: Emergency Medicine

## 2018-03-08 ENCOUNTER — Observation Stay
Admission: EM | Admit: 2018-03-08 | Discharge: 2018-03-11 | Disposition: A | Discharge status: Home / Self Care | Payer: Medicare Other | Attending: Internal Medicine | Admitting: Internal Medicine

## 2018-03-08 DIAGNOSIS — G9340 Encephalopathy, unspecified: Secondary | ICD-10-CM | POA: Insufficient documentation

## 2018-03-08 DIAGNOSIS — Z7902 Long term (current) use of antithrombotics/antiplatelets: Secondary | ICD-10-CM | POA: Diagnosis not present

## 2018-03-08 DIAGNOSIS — Z9049 Acquired absence of other specified parts of digestive tract: Secondary | ICD-10-CM | POA: Insufficient documentation

## 2018-03-08 DIAGNOSIS — Z79899 Other long term (current) drug therapy: Secondary | ICD-10-CM | POA: Diagnosis not present

## 2018-03-08 DIAGNOSIS — Z7982 Long term (current) use of aspirin: Secondary | ICD-10-CM | POA: Diagnosis not present

## 2018-03-08 DIAGNOSIS — E785 Hyperlipidemia, unspecified: Secondary | ICD-10-CM | POA: Diagnosis not present

## 2018-03-08 DIAGNOSIS — Q211 Atrial septal defect: Secondary | ICD-10-CM | POA: Diagnosis not present

## 2018-03-08 DIAGNOSIS — R0989 Other specified symptoms and signs involving the circulatory and respiratory systems: Secondary | ICD-10-CM | POA: Diagnosis not present

## 2018-03-08 DIAGNOSIS — I69322 Dysarthria following cerebral infarction: Secondary | ICD-10-CM | POA: Diagnosis not present

## 2018-03-08 DIAGNOSIS — E559 Vitamin D deficiency, unspecified: Secondary | ICD-10-CM | POA: Insufficient documentation

## 2018-03-08 DIAGNOSIS — R739 Hyperglycemia, unspecified: Secondary | ICD-10-CM | POA: Diagnosis not present

## 2018-03-08 DIAGNOSIS — G40909 Epilepsy, unspecified, not intractable, without status epilepticus: Secondary | ICD-10-CM | POA: Diagnosis present

## 2018-03-08 DIAGNOSIS — D696 Thrombocytopenia, unspecified: Secondary | ICD-10-CM | POA: Insufficient documentation

## 2018-03-08 DIAGNOSIS — B1920 Unspecified viral hepatitis C without hepatic coma: Secondary | ICD-10-CM | POA: Insufficient documentation

## 2018-03-08 DIAGNOSIS — E039 Hypothyroidism, unspecified: Secondary | ICD-10-CM | POA: Insufficient documentation

## 2018-03-08 DIAGNOSIS — F329 Major depressive disorder, single episode, unspecified: Secondary | ICD-10-CM | POA: Diagnosis not present

## 2018-03-08 DIAGNOSIS — F17210 Nicotine dependence, cigarettes, uncomplicated: Secondary | ICD-10-CM | POA: Insufficient documentation

## 2018-03-08 DIAGNOSIS — J449 Chronic obstructive pulmonary disease, unspecified: Secondary | ICD-10-CM | POA: Insufficient documentation

## 2018-03-08 DIAGNOSIS — G9389 Other specified disorders of brain: Secondary | ICD-10-CM | POA: Insufficient documentation

## 2018-03-08 DIAGNOSIS — Q909 Down syndrome, unspecified: Secondary | ICD-10-CM | POA: Insufficient documentation

## 2018-03-08 DIAGNOSIS — F79 Unspecified intellectual disabilities: Secondary | ICD-10-CM | POA: Insufficient documentation

## 2018-03-08 DIAGNOSIS — R4182 Altered mental status, unspecified: Secondary | ICD-10-CM

## 2018-03-08 LAB — COMPREHENSIVE METABOLIC PANEL
ALBUMIN: 4.6 g/dL (ref 3.5–5.0)
ALT: 26 U/L (ref 17–63)
ANION GAP: 9 (ref 5–15)
AST: 28 U/L (ref 15–41)
Alkaline Phosphatase: 53 U/L (ref 38–126)
BUN: 17 mg/dL (ref 6–20)
CALCIUM: 10.3 mg/dL (ref 8.9–10.3)
CHLORIDE: 103 mmol/L (ref 101–111)
CO2: 26 mmol/L (ref 22–32)
Creatinine, Ser: 1.1 mg/dL (ref 0.61–1.24)
GFR calc non Af Amer: >60 mL/min (ref >60)
GLUCOSE: 132 mg/dL — AB (ref 65–99)
Potassium: 4 mmol/L (ref 3.5–5.1)
SODIUM: 138 mmol/L (ref 135–145)
Total Bilirubin: 0.7 mg/dL (ref 0.3–1.2)
Total Protein: 8.8 g/dL — ABNORMAL HIGH (ref 6.5–8.1)

## 2018-03-08 LAB — CBC
HCT: 45.7 % (ref 40.0–52.0)
Hemoglobin: 14.8 g/dL (ref 13.0–18.0)
MCH: 28 pg (ref 26.0–34.0)
MCHC: 32.4 g/dL (ref 32.0–36.0)
MCV: 86.6 fL (ref 80.0–100.0)
PLATELETS: 96 10*3/uL — AB (ref 150–440)
RBC: 5.27 MIL/uL (ref 4.40–5.90)
RDW: 13.3 % (ref 11.5–14.5)
WBC: 4.8 10*3/uL (ref 3.8–10.6)

## 2018-03-08 LAB — URINALYSIS, COMPLETE (UACMP) WITH MICROSCOPIC
Bacteria, UA: NONE SEEN
Bilirubin Urine: NEGATIVE
GLUCOSE, UA: NEGATIVE mg/dL
Ketones, ur: 5 mg/dL — AB
LEUKOCYTES UA: NEGATIVE
NITRITE: NEGATIVE
PH: 5 (ref 5.0–8.0)
Protein, ur: NEGATIVE mg/dL
RBC / HPF: >50 RBC/hpf — ABNORMAL HIGH (ref 0–5)
SPECIFIC GRAVITY, URINE: 1.019 (ref 1.005–1.030)

## 2018-03-08 LAB — URINE DRUG SCREEN, QUALITATIVE (ARMC ONLY)
Amphetamines, Ur Screen: NOT DETECTED
Benzodiazepine, Ur Scrn: NOT DETECTED
CANNABINOID 50 NG, UR ~~LOC~~: NOT DETECTED
COCAINE METABOLITE, UR ~~LOC~~: NOT DETECTED
MDMA (Ecstasy)Ur Screen: NOT DETECTED
Methadone Scn, Ur: NOT DETECTED
OPIATE, UR SCREEN: NOT DETECTED
PHENCYCLIDINE (PCP) UR S: NOT DETECTED
Tricyclic, Ur Screen: NOT DETECTED

## 2018-03-08 LAB — BRAIN NATRIURETIC PEPTIDE: B NATRIURETIC PEPTIDE 5: 15 pg/mL (ref 0.0–100.0)

## 2018-03-08 LAB — TROPONIN I: Troponin I: <0.03 ng/mL (ref <0.03)

## 2018-03-08 NOTE — ED Notes (Signed)
Staff at bedside. Pt in no distress.

## 2018-03-08 NOTE — ED Notes (Signed)
Caregiver for pt refused to stay and left. Took her a recliner. Staff state she does not get off till Friday morning. Caregiver says she wasn't supposed to be there because she didn't feel good. Number to group home left behind. [place] who is the sister was updated on pt status [phone]

## 2018-03-08 NOTE — ED Notes (Signed)
Patient is resting comfortably. Caregiver given a recliner as they have been here a long time. Staff is live in.

## 2018-03-08 NOTE — ED Triage Notes (Signed)
Caregiver states patient has been disoriented all weekend.  Seen by PCP this morning and sent to ED for further evaluation.  Patient had a recent hosptialization for UTI, but PCP checked urine today and told caregiver that there was no sign of UTI.

## 2018-03-08 NOTE — ED Notes (Signed)
Report given to telepsych.

## 2018-03-08 NOTE — ED Notes (Signed)
ED Provider at bedside. 

## 2018-03-08 NOTE — ED Notes (Signed)
Pt given food tray and large cup of water to drink.

## 2018-03-08 NOTE — ED Provider Notes (Signed)
1830 MRI without acute finding. Discussed with patient and staff member from group home. They do state that this is an acute change in the patient's ADLs. Discussed with Dr. Posey Pronto who does not want to admit for AMS at this time until psychiatry evaluates. Will have telepsych evaluate.    Nance Pear, MD 03/08/18 [phone]

## 2018-03-08 NOTE — ED Provider Notes (Signed)
[facility] Emergency Department Provider Note   ____________________________________________   First MD Initiated Contact with Patient 03/08/18 1423     (approximate)  I have reviewed the triage vital signs and the nursing notes.   HISTORY  Chief Complaint Altered Mental Status  History limited by altered mental status  HPI Mario Liu is a 57 y.o. male patient has seemed to be disoriented all weekend but usually walks and talks and does well.  He was in the hospital recently for UTI with PCP checked urine today and told told the caregiver there is no sign of UTI.  I have been unable to find a record of that and the limited access I have to some of the outpatient office records.  Today patient has slurry speech and seems to be talking about things that are not present in the room like wanting serial he points to there is some there up on the ceiling.  This is not like him at all per his caregivers.  This is much worse than he is ever been.   Past Medical History:  Diagnosis Date  . COPD (chronic obstructive pulmonary disease) ([place])   . Hepatitis C   . Hx of cholecystectomy   . Hypothyroidism   . Kidney cysts   . Major depressive disorder    Was treated at facility in [place] ([facility] years ago  . Stroke ([place])   . Tobacco abuse   . Vitamin D deficiency     Patient Active Problem List   Diagnosis Date Noted  . Recurrent falls 03/01/2018  . Seizure disorder ([place]) 11/24/2017  . Major depressive disorder   . Hepatitis C antibody test positive 05/16/2017  . Acute ischemic left MCA stroke ([place])   . Down's syndrome   . PFO with atrial septal aneurysm     Past Surgical History:  Procedure Laterality Date  . LAPAROSCOPIC CHOLECYSTECTOMY      Prior to Admission medications   Medication Sig Start Date End Date Taking? Authorizing Provider  albuterol (PROVENTIL HFA;VENTOLIN HFA) 108 (90 Base) MCG/ACT inhaler Inhale into the lungs every 6 (six)  hours as needed for wheezing or shortness of breath.    [provider]  alendronate (FOSAMAX) 70 MG tablet Take 70 mg by mouth once a week. Fridays    [provider]  alum & mag hydroxide-simeth (GERI-LANTA) 200-200-20 MG/5ML suspension Take 15 mLs by mouth every 6 (six) hours as needed for indigestion or heartburn.    [provider]  aspirin 325 MG tablet Take 325 mg daily by mouth.    [provider]  atorvastatin (LIPITOR) 40 MG tablet Take 40 mg daily by mouth.    [provider]  buPROPion (WELLBUTRIN XL) 150 MG 24 hr tablet Take 150 mg by mouth daily.     [provider]  calcium carbonate (OS-CAL) 600 MG TABS tablet Take 600 mg by mouth 2 (two) times daily with a meal.     [provider]  cefUROXime (CEFTIN) 250 MG tablet Take 1 tablet (250 mg total) by mouth 2 (two) times daily with a meal for 5 days. 03/03/18 03/08/18  Henreitta Leber, MD  cholecalciferol 1000 units tablet Take 1 tablet (1,000 Units total) by mouth daily. 03/04/18 05/03/18  Henreitta Leber, MD  citalopram (CELEXA) 40 MG tablet Take 40 mg by mouth daily.     [provider]  clonazePAM (KLONOPIN) 0.5 MG tablet Take 0.5 mg by mouth daily.  [provider]  clopidogrel (PLAVIX) 75 MG tablet Take 75 mg daily by mouth.    [provider]  fluticasone (FLONASE) 50 MCG/ACT nasal spray Place 1 spray into both nostrils daily.     [provider]  lacosamide (VIMPAT) 200 MG TABS tablet Take 200 mg by mouth 2 (two) times daily.    [provider]  loratadine (CLARITIN) 10 MG tablet Take 10 mg by mouth daily.    [provider]  polyethylene glycol (MIRALAX / GLYCOLAX) packet Take 17 g by mouth daily as needed.    [provider]  potassium chloride (K-DUR) 10 MEQ tablet Take 10 mEq by mouth daily.     [provider]  prochlorperazine (COMPAZINE) 5 MG tablet Take 5 mg by mouth every 6 (six) hours  as needed for nausea or vomiting.    [provider]  risperidone (RISPERDAL) 4 MG tablet Take 4 mg by mouth at bedtime.     [provider]  traZODone (DESYREL) 50 MG tablet Take 0.5-1 tablets (25-50 mg total) by mouth at bedtime as needed for sleep. Patient taking differently: Take 50 mg by mouth at bedtime.  05/25/17   Bary Leriche, PA-C    Allergies Patient has no known allergies.  Family History  Adopted: Yes  Problem Relation Age of Onset  . Diabetes Maternal Grandmother     Social History Social History   Tobacco Use  . Smoking status: Current Every Day Smoker    Packs/day: 0.50    Years: 39.00    Pack years: 19.50    Types: Cigarettes  . Smokeless tobacco: Never Used  . Tobacco comment: pt is still smoking  Substance Use Topics  . Alcohol use: No  . Drug use: No    Review of Systems Unable to obtain  ____________________________________________   PHYSICAL EXAM:  VITAL SIGNS: ED Triage Vitals  Enc Vitals Group     BP 03/08/18 1245 107/78     Pulse Rate 03/08/18 1245 75     Resp 03/08/18 1245 16     Temp 03/08/18 1245 98.1 F (36.7 C)     Temp Source 03/08/18 1245 Oral     SpO2 03/08/18 1245 96 %     Weight 03/08/18 1244 192 lb (87.1 kg)     Height 03/08/18 1244 5\' 6"  (1.676 m)     Head Circumference --      Peak Flow --      Pain Score 03/08/18 1244 0     Pain Loc --      Pain Edu? --      Excl. in [place]? --     Constitutional: Alert . Well appearing and in no acute distress but not making any sense when he talks.. Eyes: Conjunctivae are normal. PER. EOMI. Head: Atraumatic. Nose: No congestion/rhinnorhea. Mouth/Throat: Mucous membranes are moist.  Oropharynx non-erythematous. Neck: No stridor.  Cardiovascular: Normal rate, regular rhythm. Grossly normal heart sounds.  Good peripheral circulation. Respiratory: Normal respiratory effort.  No retractions. Lungs CTAB. Gastrointestinal: Soft and nontender. No distention. No  abdominal bruits. No CVA tenderness. Musculoskeletal: No lower extremity tenderness nor edema.  No joint effusions. Neurologic: Speech is slurry.  Patient does follow commands does not appear to have any focal weakness or numbness. Skin:  Skin is warm, dry and intact. No rash noted.   ____________________________________________   LABS (all labs ordered are listed, but only abnormal results are displayed)  Labs Reviewed  COMPREHENSIVE METABOLIC PANEL -  Abnormal; Notable for the following components:      Result Value   Glucose, Bld 132 (*)    Total Protein 8.8 (*)    All other components within normal limits  CBC - Abnormal; Notable for the following components:   Platelets 96 (*)    All other components within normal limits  TROPONIN I  BRAIN NATRIURETIC PEPTIDE  URINALYSIS, COMPLETE (UACMP) WITH MICROSCOPIC  URINE DRUG SCREEN, QUALITATIVE ([facility] ONLY)  CBG MONITORING, ED   ____________________________________________  EKG   ____________________________________________  RADIOLOGY  ED MD interpretation  Official radiology report(s): Ct Head Wo Contrast  Result Date: 03/08/2018 CLINICAL DATA:  57 y/o M; unexplained altered level of consciousness. EXAM: CT HEAD WITHOUT CONTRAST TECHNIQUE: Contiguous axial images were obtained from the base of the skull through the vertex without intravenous contrast. COMPARISON:  03/02/2018 MRI head.  03/01/2018 CT head. FINDINGS: Brain: Stable distribution of chronic left posterior MCA infarction. No acute stroke, hemorrhage, focal mass effect. No extra-axial collection, hydrocephalus, or effacement of basilar cisterns. Vascular: Calcific atherosclerosis of carotid siphons. No hyperdense vessel identified. Skull: Normal. Negative for fracture or focal lesion. Sinuses/Orbits: Partial opacification of ethmoid air cells and right sphenoid sinus mucous retention cyst in the left maxillary sinus. Normal aeration mastoid air cells. Orbits are  unremarkable. Other: None. IMPRESSION: 1. No acute intracranial abnormality. 2. Stable chronic left posterior MCA distribution infarct. Electronically Signed   By: Kristine Garbe M.D.   On: 03/08/2018 15:32   Dg Chest Portable 1 View  Result Date: 03/08/2018 CLINICAL DATA:  Disoriented all weekend. Recent hospitalization for UTI. EXAM: PORTABLE CHEST 1 VIEW COMPARISON:  Chest x-ray dated 03/01/2018. FINDINGS: Cardiomediastinal silhouette is stable in size and configuration. There is mild central pulmonary vascular congestion without overt alveolar pulmonary edema. No confluent opacity to suggest developing pneumonia. No pleural effusion or pneumothorax seen. No acute or suspicious osseous finding. IMPRESSION: 1. Mild central pulmonary vascular congestion without overt alveolar pulmonary edema. 2. No evidence of pneumonia. Electronically Signed   By: Franki Cabot M.D.   On: 03/08/2018 14:46    ____________________________________________   PROCEDURES  Procedure(s) performed:   Procedures  Critical Care performed:  ____________________________________________   INITIAL IMPRESSION / ASSESSMENT AND PLAN / ED COURSE  Patient's BNP is negative his troponin is negative his white count is normal he has no fever not sure what the chest x-ray is showing.  He is not coughing either.  His CT of the head is unchanged from prior.  He does have slurry speech although is cleared up slightly since he first got here but he still not acting like himself per his caregivers.  His belly does not appear to be tender I have no reason for his change in mental status.  We will get him in the hospital and monitor him closely.         ____________________________________________   FINAL CLINICAL IMPRESSION(S) / ED DIAGNOSES  Final diagnoses:  Altered mental status, unspecified altered mental status type     ED Discharge Orders    None       Note:  This document was prepared using Dragon  voice recognition software and may include unintentional dictation errors.    Nena Polio, MD 03/08/18 [phone]

## 2018-03-09 ENCOUNTER — Encounter: Payer: Self-pay | Admitting: Internal Medicine

## 2018-03-09 ENCOUNTER — Other Ambulatory Visit: Payer: Self-pay

## 2018-03-09 DIAGNOSIS — R400 Somnolence: Secondary | ICD-10-CM | POA: Diagnosis not present

## 2018-03-09 DIAGNOSIS — G40909 Epilepsy, unspecified, not intractable, without status epilepticus: Secondary | ICD-10-CM | POA: Diagnosis not present

## 2018-03-09 DIAGNOSIS — R4182 Altered mental status, unspecified: Secondary | ICD-10-CM

## 2018-03-09 HISTORY — DX: Altered mental status, unspecified: R41.82

## 2018-03-09 LAB — BLOOD GAS, VENOUS
ACID-BASE EXCESS: 3.1 mmol/L — AB (ref 0.0–2.0)
BICARBONATE: 29.5 mmol/L — AB (ref 20.0–28.0)
PCO2 VEN: 51 mmHg (ref 44.0–60.0)
PH VEN: 7.37 (ref 7.250–7.430)
Patient temperature: 37

## 2018-03-09 LAB — AMMONIA: Ammonia: 17 umol/L (ref 9–35)

## 2018-03-09 MED ORDER — ONDANSETRON HCL 4 MG PO TABS: 4.0000 mg | ORAL_TABLET | Freq: Four times a day (QID) | ORAL | Status: DC | PRN

## 2018-03-09 MED ORDER — ALBUTEROL SULFATE (2.5 MG/3ML) 0.083% IN NEBU: 2.5000 mg | INHALATION_SOLUTION | Freq: Four times a day (QID) | RESPIRATORY_TRACT | Status: DC | PRN

## 2018-03-09 MED ORDER — CLONAZEPAM 0.5 MG PO TABS
0.5000 mg | ORAL_TABLET | Freq: Every day | ORAL | Status: DC
  Administered 2018-03-09: 08:00:00 0.5 mg via ORAL
  Filled 2018-03-09: qty 1

## 2018-03-09 MED ORDER — ACETAMINOPHEN 325 MG PO TABS: 650.0000 mg | ORAL_TABLET | Freq: Four times a day (QID) | ORAL | Status: DC | PRN

## 2018-03-09 MED ORDER — ACETAMINOPHEN 650 MG RE SUPP: 650.0000 mg | Freq: Four times a day (QID) | RECTAL | Status: DC | PRN

## 2018-03-09 MED ORDER — ONDANSETRON HCL 4 MG/2ML IJ SOLN: 4.0000 mg | Freq: Four times a day (QID) | INTRAMUSCULAR | Status: DC | PRN

## 2018-03-09 MED ORDER — CALCIUM CARBONATE ANTACID 500 MG PO CHEW
500.0000 mg | CHEWABLE_TABLET | Freq: Two times a day (BID) | ORAL | Status: DC
  Administered 2018-03-09 – 2018-03-11 (×4): 500 mg via ORAL
  Filled 2018-03-09 (×4): qty 3

## 2018-03-09 MED ORDER — ENOXAPARIN SODIUM 40 MG/0.4ML ~~LOC~~ SOLN
40.0000 mg | SUBCUTANEOUS | Status: DC
  Administered 2018-03-09 – 2018-03-10 (×2): 40 mg via SUBCUTANEOUS
  Filled 2018-03-09 (×2): qty 0.4

## 2018-03-09 MED ORDER — CITALOPRAM HYDROBROMIDE 20 MG PO TABS
40.0000 mg | ORAL_TABLET | Freq: Every day | ORAL | Status: DC
  Administered 2018-03-09 – 2018-03-11 (×3): 40 mg via ORAL
  Filled 2018-03-09 (×3): qty 2

## 2018-03-09 MED ORDER — BUPROPION HCL ER (XL) 150 MG PO TB24
150.0000 mg | ORAL_TABLET | Freq: Every day | ORAL | Status: DC
  Administered 2018-03-09 – 2018-03-11 (×3): 150 mg via ORAL
  Filled 2018-03-09 (×3): qty 1

## 2018-03-09 MED ORDER — LACOSAMIDE 50 MG PO TABS
200.0000 mg | ORAL_TABLET | Freq: Two times a day (BID) | ORAL | Status: DC
  Administered 2018-03-09 – 2018-03-11 (×5): 200 mg via ORAL
  Filled 2018-03-09 (×5): qty 4

## 2018-03-09 MED ORDER — ASPIRIN EC 325 MG PO TBEC
325.0000 mg | DELAYED_RELEASE_TABLET | Freq: Every day | ORAL | Status: DC
  Administered 2018-03-09 – 2018-03-11 (×3): 325 mg via ORAL
  Filled 2018-03-09 (×4): qty 1

## 2018-03-09 MED ORDER — SENNOSIDES-DOCUSATE SODIUM 8.6-50 MG PO TABS: 1.0000 | ORAL_TABLET | Freq: Every evening | ORAL | Status: DC | PRN

## 2018-03-09 MED ORDER — VITAMIN D 1000 UNITS PO TABS
1000.0000 [IU] | ORAL_TABLET | Freq: Every day | ORAL | Status: DC
  Administered 2018-03-09 – 2018-03-11 (×3): 1000 [IU] via ORAL
  Filled 2018-03-09 (×3): qty 1

## 2018-03-09 MED ORDER — CLOPIDOGREL BISULFATE 75 MG PO TABS
75.0000 mg | ORAL_TABLET | Freq: Every day | ORAL | Status: DC
  Administered 2018-03-09 – 2018-03-11 (×3): 75 mg via ORAL
  Filled 2018-03-09 (×3): qty 1

## 2018-03-09 MED ORDER — ATORVASTATIN CALCIUM 20 MG PO TABS
40.0000 mg | ORAL_TABLET | Freq: Every day | ORAL | Status: DC
  Administered 2018-03-09 – 2018-03-10 (×2): 40 mg via ORAL
  Filled 2018-03-09 (×2): qty 2

## 2018-03-09 MED ORDER — BISACODYL 5 MG PO TBEC: 5.0000 mg | DELAYED_RELEASE_TABLET | Freq: Every day | ORAL | Status: DC | PRN

## 2018-03-09 NOTE — Progress Notes (Signed)
Patient ID: Mario Liu, male   DOB: Jul 25, 1961, 57 y.o.   MRN: [id number]  [employer] PROGRESS NOTE  Mario Liu JJO:[id number] DOB: 08-07-1961 DOA: 03/08/2018 PCP: [name], FNP  HPI/Subjective: I saw the patient walking around with the physical therapist earlier in the day.  When I went in the room he was sleeping.  After morning medications he was sleeping.  Objective: Vitals:   03/09/18 0408 03/09/18 1416  BP: 123/79 104/79  Pulse: 70 87  Resp: 18 18  Temp: (!) 97.3 F (36.3 C) 97.7 F (36.5 C)  SpO2: 96% 99%   No intake or output data in the 24 hours ending 03/09/18 1526 Filed Weights   03/08/18 1244  Weight: 87.1 kg (192 lb)    ROS: Review of Systems  Unable to perform ROS: Acuity of condition   Exam: Physical Exam  Constitutional: He appears lethargic.  HENT:  Nose: No mucosal edema.  Mouth/Throat: No oropharyngeal exudate or posterior oropharyngeal edema.  Eyes: Pupils are equal, round, and reactive to light. Conjunctivae, EOM and lids are normal.  Neck: No JVD present. Carotid bruit is not present. No edema present. No thyroid mass and no thyromegaly present.  Cardiovascular: S1 normal and S2 normal. Exam reveals no gallop.  No murmur heard. Pulses:      Dorsalis pedis pulses are 2+ on the right side, and 2+ on the left side.  Respiratory: No respiratory distress. He has no wheezes. He has no rhonchi. He has no rales.  GI: Soft. Bowel sounds are normal. There is no tenderness.  Musculoskeletal:       Right ankle: He exhibits no swelling.       Left ankle: He exhibits no swelling.  Lymphadenopathy:    He has no cervical adenopathy.  Neurological: He appears lethargic.  Skin: Skin is warm. No rash noted. Nails show no clubbing.  Psychiatric:  Lethargic      Data Reviewed: Basic Metabolic Panel: Recent Labs  Lab 03/08/18 1259  NA 138  K 4.0  CL 103  CO2 26  GLUCOSE 132*  BUN 17  CREATININE 1.10  CALCIUM 10.3   Liver Function  Tests: Recent Labs  Lab 03/08/18 1259  AST 28  ALT 26  ALKPHOS 53  BILITOT 0.7  PROT 8.8*  ALBUMIN 4.6    Recent Labs  Lab 03/08/18 2342  AMMONIA 17   CBC: Recent Labs  Lab 03/08/18 1259  WBC 4.8  HGB 14.8  HCT 45.7  MCV 86.6  PLT 96*   Cardiac Enzymes: Recent Labs  Lab 03/08/18 1259  TROPONINI <0.03   BNP (last 3 results) Recent Labs    03/08/18 1259  BNP 15.0   She is  Recent Results (from the past 240 hour(s))  MRSA PCR Screening     Status: None   Collection Time: 03/01/18 10:43 PM  Result Value Ref Range Status   MRSA by PCR NEGATIVE NEGATIVE Final    Comment:        The GeneXpert MRSA Assay (FDA approved for NASAL specimens only), is one component of a comprehensive MRSA colonization surveillance program. It is not intended to diagnose MRSA infection nor to guide or monitor treatment for MRSA infections. Performed at [facility], [address] [place], [place] [address]   Urine Culture     Status: None   Collection Time: 03/03/18  9:27 AM  Result Value Ref Range Status   Specimen Description   Final    URINE, RANDOM  Performed at [facility], [address], [place], [place] [address]    Special Requests   Final    NONE Performed at [facility], [address] [place], [place] [address]    Culture   Final    NO GROWTH Performed at [facility], [place] [address] [place] [address], [place], [place] [address]    Report Status 03/04/2018 FINAL  Final     Studies: Ct Head Wo Contrast  Result Date: 03/08/2018 CLINICAL DATA:  57 y/o M; unexplained altered level of consciousness. EXAM: CT HEAD WITHOUT CONTRAST TECHNIQUE: Contiguous axial images were obtained from the base of the skull through the vertex without intravenous contrast. COMPARISON:  03/02/2018 MRI head.  03/01/2018 CT head. FINDINGS: Brain: Stable distribution of chronic left posterior MCA infarction. No acute stroke, hemorrhage, focal mass effect. No  extra-axial collection, hydrocephalus, or effacement of basilar cisterns. Vascular: Calcific atherosclerosis of carotid siphons. No hyperdense vessel identified. Skull: Normal. Negative for fracture or focal lesion. Sinuses/Orbits: Partial opacification of ethmoid air cells and right sphenoid sinus mucous retention cyst in the left maxillary sinus. Normal aeration mastoid air cells. Orbits are unremarkable. Other: None. IMPRESSION: 1. No acute intracranial abnormality. 2. Stable chronic left posterior MCA distribution infarct. Electronically Signed   By: Kristine Garbe M.D.   On: 03/08/2018 15:32   Mr Brain Wo Contrast  Result Date: 03/08/2018 CLINICAL DATA:  Altered level of consciousness. Recent urinary tract infection. EXAM: MRI HEAD WITHOUT CONTRAST TECHNIQUE: Multiplanar, multiecho pulse sequences of the brain and surrounding structures were obtained without intravenous contrast. COMPARISON:  CT head without contrast 03/08/2018. MRI brain 03/02/2018 and 05/16/2017. FINDINGS: Brain: Remote encephalomalacia of the left temporal and parietal lobe is again seen. The ventricles are of normal size. No significant hemorrhage or mass lesion is present. No acute infarct is present. Brainstem and cerebellum are normal. Vascular: Flow is present in the major intracranial arteries. Skull and upper cervical spine: Skull base is within normal limits. Craniocervical junction is normal. There is significant patient motion on the sagittal images. Upper cervical spine is grossly within normal limits. Sinuses/Orbits: Polyp or mucous retention cyst is noted in the right sphenoid sinus left maxillary sinus. There is mild mucosal thickening in the ethmoid air cells. No fluid levels present. Globes and orbits are within normal limits. IMPRESSION: 1. No acute intracranial abnormality. 2. Stable chronic encephalomalacia involving the left parietal and temporal lobe. Electronically Signed   By: San Morelle M.D.    On: 03/08/2018 17:19   Dg Chest Portable 1 View  Result Date: 03/08/2018 CLINICAL DATA:  Disoriented all weekend. Recent hospitalization for UTI. EXAM: PORTABLE CHEST 1 VIEW COMPARISON:  Chest x-ray dated 03/01/2018. FINDINGS: Cardiomediastinal silhouette is stable in size and configuration. There is mild central pulmonary vascular congestion without overt alveolar pulmonary edema. No confluent opacity to suggest developing pneumonia. No pleural effusion or pneumothorax seen. No acute or suspicious osseous finding. IMPRESSION: 1. Mild central pulmonary vascular congestion without overt alveolar pulmonary edema. 2. No evidence of pneumonia. Electronically Signed   By: Franki Cabot M.D.   On: 03/08/2018 14:46    Scheduled Meds: . aspirin EC  325 mg Oral Daily  . atorvastatin  40 mg Oral Daily  . buPROPion  150 mg Oral Daily  . calcium carbonate  500 mg Oral BID WC  . cholecalciferol  1,000 Units Oral Daily  . citalopram  40 mg Oral Daily  . clonazePAM  0.5 mg Oral Daily  . clopidogrel  75 mg Oral Daily  . enoxaparin (LOVENOX) injection  40 mg Subcutaneous Q24H  . lacosamide  200 mg Oral BID   Continuous Infusions:  Assessment/Plan:  1. Acute encephalopathy.  Patient sleeping when I saw him.  Unable to make good assessment on the cause.  MRI of the brain is negative.  I saw the patient walking around with physical therapy earlier.  Hopefully can go back to group home tomorrow.  Consulted psychiatry but they were unable to talk with him also.  Admitting physician held risperidone and trazodone. 2. Chronic thrombocytopenia and hepatitis C likely this is the cause.  3.  History of stroke on aspirin Plavix and statin. 4. Hyperlipidemia unspecified on atorvastatin 5. History of seizures on Vimpat  Code Status:     Code Status Orders  (From admission, onward)        Start     Ordered   03/09/18 0411  Full code  Continuous     03/09/18 0410    Code Status History    Date Active Date  Inactive Code Status Order ID Comments User Context   03/01/2018 2228 03/03/2018 1724 Full Code [id number]  Dustin Flock, MD Inpatient   05/18/2017 1552 05/26/2017 1637 Full Code [id number]  Flora Lipps Inpatient   05/18/2017 1552 05/18/2017 1552 Full Code [id number]  Flora Lipps Inpatient   05/14/2017 1847 05/18/2017 1542 Full Code [id number]  Maryellen Pile, MD Inpatient     Family Communication: Left message for legal guardian Disposition Plan: Hopefully back to group home soon  Time spent: 30 minutes  [place]

## 2018-03-09 NOTE — ED Notes (Signed)
Pt sleeping soundly.

## 2018-03-09 NOTE — H&P (Addendum)
[facility] [place] NAME: Mario Liu    MR#:  [id number]  DATE OF BIRTH:  [birth year]  DATE OF ADMISSION:  03/08/2018  PRIMARY CARE PHYSICIAN: [name], FNP   REQUESTING/REFERRING PHYSICIAN: Nena Polio, MD  CHIEF COMPLAINT:   Chief Complaint  Patient presents with  . Altered Mental Status    HISTORY OF PRESENT ILLNESS:  Mario Liu  is a 57 y.o. male with a known history of depression, CVA (w/ residual dysarthria), Down's syndrome who presents from group home w/ AMS. Relevant Hx/ROS unobtainable from pt, given his condition. Pt was reported to be somnolent/lethargic and unable to walk. I found Mr. Krejci sleeping comfortably with a blanket over his head. When I woke him, he sat upright in bed without any difficulty or need for assistance. He was able to tell me he is in the hospital and it is 2019. His speech is dysarthric and he is somewhat difficult to understand, but he responds appropriately and to the best of his abilities, and follows simple commands. He tells me he feels well, and wants to know when he can go home. He is comfortable, well-appearing and in no acute distress. He is afebrile and SIRS (-). His labwork is unimpressive. An MRI performed in the ED is (-) acute intracranial abnl. He had a similar presentation on 03/01/2018. He was diagnosed w/ UTI at that time. He has completed treatment. He does not have a UTI on present admission.  PAST MEDICAL HISTORY:   Past Medical History:  Diagnosis Date  . COPD (chronic obstructive pulmonary disease) ([place])   . Hepatitis C   . Hx of cholecystectomy   . Hypothyroidism   . Kidney cysts   . Major depressive disorder    Was treated at facility in [place] ([facility]) years ago  . Stroke ([place])   . Tobacco abuse   . Vitamin D deficiency     PAST SURGICAL HISTORY:   Past Surgical History:  Procedure Laterality Date  . LAPAROSCOPIC CHOLECYSTECTOMY      SOCIAL HISTORY:    Social History   Tobacco Use  . Smoking status: Current Every Day Smoker    Packs/day: 0.50    Years: 39.00    Pack years: 19.50    Types: Cigarettes  . Smokeless tobacco: Never Used  . Tobacco comment: pt is still smoking  Substance Use Topics  . Alcohol use: No    FAMILY HISTORY:   Family History  Adopted: Yes  Problem Relation Age of Onset  . Diabetes Maternal Grandmother     DRUG ALLERGIES:  No Known Allergies  REVIEW OF SYSTEMS:   Review of Systems  Unable to perform ROS: Mental acuity   Down's syndrome, Hx CVA (w/ residual dysarthria), from group home MEDICATIONS AT HOME:   Prior to Admission medications   Medication Sig Start Date End Date Taking? Authorizing Provider  albuterol (PROVENTIL HFA;VENTOLIN HFA) 108 (90 Base) MCG/ACT inhaler Inhale into the lungs every 6 (six) hours as needed for wheezing or shortness of breath.   Yes [provider]  alum & mag hydroxide-simeth (GERI-LANTA) 200-200-20 MG/5ML suspension Take 15 mLs by mouth every 6 (six) hours as needed for indigestion or heartburn.   Yes [provider]  aspirin 325 MG tablet Take 325 mg daily by mouth.   Yes [provider]  atorvastatin (LIPITOR) 40 MG tablet Take 40 mg daily by mouth.   Yes [provider]  buPROPion (WELLBUTRIN XL) 150 MG 24 hr tablet Take 150 mg by mouth daily.    Yes [provider]  calcium carbonate (OS-CAL) 600 MG TABS tablet Take 600 mg by mouth 2 (two) times daily with a meal.    Yes [provider]  cholecalciferol 1000 units tablet Take 1 tablet (1,000 Units total) by mouth daily. 03/04/18 05/03/18 Yes Sainani, Belia Heman, MD  citalopram (CELEXA) 40 MG tablet Take 40 mg by mouth daily.    Yes [provider]  clonazePAM (KLONOPIN) 0.5 MG tablet Take 0.5 mg by mouth daily.    Yes [provider]  clopidogrel (PLAVIX) 75 MG tablet Take 75 mg daily by mouth.   Yes [provider]  fluticasone  (FLONASE) 50 MCG/ACT nasal spray Place 1 spray into both nostrils daily.    Yes [provider]  lacosamide (VIMPAT) 200 MG TABS tablet Take 200 mg by mouth 2 (two) times daily.   Yes [provider]  loratadine (CLARITIN) 10 MG tablet Take 10 mg by mouth daily.   Yes [provider]  polyethylene glycol (MIRALAX / GLYCOLAX) packet Take 17 g by mouth daily as needed.   Yes [provider]  potassium chloride (K-DUR) 10 MEQ tablet Take 10 mEq by mouth daily.    Yes [provider]  risperidone (RISPERDAL) 4 MG tablet Take 4 mg by mouth at bedtime.    Yes [provider]  traZODone (DESYREL) 50 MG tablet Take 0.5-1 tablets (25-50 mg total) by mouth at bedtime as needed for sleep. Patient taking differently: Take 50 mg by mouth at bedtime.  05/25/17  Yes Love, Ivan Anchors, PA-C  alendronate (FOSAMAX) 70 MG tablet Take 70 mg by mouth once a week. Fridays    [provider]      VITAL SIGNS:  Blood pressure 115/84, pulse 94, temperature (!) 97.4 F (36.3 C), resp. rate (!) 22, height 5\' 6"  (1.676 m), weight 87.1 kg (192 lb), SpO2 97 %.  PHYSICAL EXAMINATION:  Physical Exam  Constitutional: He appears well-developed and well-nourished. He is sleeping, active and cooperative. He is easily aroused.  Non-toxic appearance. He does not have a sickly appearance. He does not appear ill. No distress. He is not intubated.  Asleep at the time of my assessment initially, but aroused easily and stayed awake without difficulty during my exam. Answers questions, follows commands. Appears alert, oriented. Does not appear to be lethargic.  HENT:  Head: Atraumatic.  Mouth/Throat: Oropharynx is clear and moist. No oropharyngeal exudate.  Eyes: Conjunctivae and lids are normal. No scleral icterus.  Neck: Neck supple. No JVD present. No thyromegaly present.  Cardiovascular: Normal rate, regular rhythm, S1 normal, S2 normal and normal heart sounds.  No  extrasystoles are present. Exam reveals no gallop, no S3, no S4, no distant heart sounds and no friction rub.  No murmur heard. Pulmonary/Chest: Effort normal and breath sounds normal. No accessory muscle usage or stridor. No apnea, no tachypnea and no bradypnea. He is not intubated. No respiratory distress. He has no decreased breath sounds. He has no wheezes. He has no rhonchi. He has no rales.  Abdominal: Soft. Bowel sounds are normal. He exhibits no distension. There is no tenderness. There is no rebound and no guarding.  Lymphadenopathy:    He has no cervical adenopathy.  Neurological: He is alert and easily aroused.  Skin: Skin is warm, dry and intact. No rash noted. He is not diaphoretic. No erythema.  Psychiatric: His mood appears not anxious. His affect is not angry and not inappropriate. His speech is slurred. His speech is not rapid and/or pressured and not delayed. He is not agitated, not aggressive, not hyperactive, not withdrawn and not combative. He is communicative. He is attentive.   Dysarthria (residual from prior CVA) LABORATORY PANEL:   CBC Recent Labs  Lab 03/08/18 1259  WBC 4.8  HGB 14.8  HCT 45.7  PLT 96*   ------------------------------------------------------------------------------------------------------------------  Chemistries  Recent Labs  Lab 03/08/18 1259  NA 138  K 4.0  CL 103  CO2 26  GLUCOSE 132*  BUN 17  CREATININE 1.10  CALCIUM 10.3  AST 28  ALT 26  ALKPHOS 53  BILITOT 0.7   ------------------------------------------------------------------------------------------------------------------  Cardiac Enzymes Recent Labs  Lab 03/08/18 1259  TROPONINI <0.03   ------------------------------------------------------------------------------------------------------------------  RADIOLOGY:  Ct Head Wo Contrast  Result Date: 03/08/2018 CLINICAL DATA:  57 y/o M; unexplained altered level of consciousness. EXAM: CT HEAD WITHOUT CONTRAST  TECHNIQUE: Contiguous axial images were obtained from the base of the skull through the vertex without intravenous contrast. COMPARISON:  03/02/2018 MRI head.  03/01/2018 CT head. FINDINGS: Brain: Stable distribution of chronic left posterior MCA infarction. No acute stroke, hemorrhage, focal mass effect. No extra-axial collection, hydrocephalus, or effacement of basilar cisterns. Vascular: Calcific atherosclerosis of carotid siphons. No hyperdense vessel identified. Skull: Normal. Negative for fracture or focal lesion. Sinuses/Orbits: Partial opacification of ethmoid air cells and right sphenoid sinus mucous retention cyst in the left maxillary sinus. Normal aeration mastoid air cells. Orbits are unremarkable. Other: None. IMPRESSION: 1. No acute intracranial abnormality. 2. Stable chronic left posterior MCA distribution infarct. Electronically Signed   By: Kristine Garbe M.D.   On: 03/08/2018 15:32   Mr Brain Wo Contrast  Result Date: 03/08/2018 CLINICAL DATA:  Altered level of consciousness. Recent urinary tract infection. EXAM: MRI HEAD WITHOUT CONTRAST TECHNIQUE: Multiplanar, multiecho pulse sequences of the brain and surrounding structures were obtained without intravenous contrast. COMPARISON:  CT head without contrast 03/08/2018. MRI brain 03/02/2018 and 05/16/2017. FINDINGS: Brain: Remote encephalomalacia of the left temporal and parietal lobe is again seen. The ventricles are of normal size. No significant hemorrhage or mass lesion is present. No acute infarct is present. Brainstem and cerebellum are normal. Vascular: Flow is present in the major intracranial arteries. Skull and upper cervical spine: Skull base is within normal limits. Craniocervical junction is normal. There is significant patient motion on the sagittal images. Upper cervical spine is grossly within normal limits. Sinuses/Orbits: Polyp or mucous retention cyst is noted in the right sphenoid sinus left maxillary sinus. There  is mild mucosal thickening in the ethmoid air cells. No fluid levels present. Globes and orbits are within normal limits. IMPRESSION: 1. No acute intracranial abnormality. 2. Stable chronic encephalomalacia involving the left parietal and temporal lobe. Electronically Signed   By: San Morelle M.D.   On: 03/08/2018 17:19   Dg Chest Portable 1 View  Result Date: 03/08/2018 CLINICAL DATA:  Disoriented all weekend. Recent hospitalization for UTI. EXAM: PORTABLE CHEST 1 VIEW COMPARISON:  Chest x-ray dated 03/01/2018. FINDINGS: Cardiomediastinal silhouette is stable in size and configuration. There is mild central pulmonary vascular congestion without overt alveolar pulmonary edema. No confluent opacity to suggest developing pneumonia. No pleural effusion or pneumothorax seen. No acute or suspicious osseous finding. IMPRESSION: 1. Mild central pulmonary vascular congestion without overt alveolar pulmonary edema. 2. No evidence of pneumonia. Electronically Signed   By: Roxy Horseman.D.  On: 03/08/2018 14:46   IMPRESSION AND PLAN:   A/P: 86M AMS/ambulatory failure (resolving), likely medication-induced. Mild hyperglycemia, chronic thrombocytopenia. -Observation -Laboratory/imaging workup completed in ED and on recent admission ~8d ago, no further testing ordered -AMS may be medication-related, on Risperidone 4mg  qHS -Holding Risperidone and Trazodone -c/w other home meds -Appears fairly well to me, can likely be discharged soon if able to ambulate -Had Heme/Onc consult on 11/04/2017 for thrombocytopenia, may be due to Risperidone vs. chronic HCV; stable -Cardiac diet -Lovenox -Full code -Observation, < 2 midnights   All the records are reviewed and case discussed with ED provider. Management plans discussed with the patient, family and they are in agreement.  CODE STATUS: Full  TOTAL TIME TAKING CARE OF THIS PATIENT: 45 minutes.    Arta Silence M.D on 03/09/2018 at 3:06  AM  Between 7am to 6pm - Pager - [phone]  After 6pm go to [email or web address] - Proofreader  [facility]  Office  [phone]  CC: Primary care physician; [name], FNP   Note: This dictation was prepared with Dragon dictation along with smaller phrase technology. Any transcriptional errors that result from this process are unintentional.

## 2018-03-09 NOTE — Clinical Social Work Note (Signed)
Clinical Social Work Assessment  Patient Details  Name: Mario Liu MRN: [id number] Date of Birth: [birth year]  Date of referral:  03/09/18               Reason for consult:  Facility Placement                Permission sought to share information with:  Case Manager, Customer service manager, Family Supports Permission granted to share information::  Yes, Verbal Permission Granted  Name::        Agency::     Relationship::     Contact Information:     Housing/Transportation Living arrangements for the past 2 months:  Group Home Source of Information:  Patient, Facility Patient Interpreter Needed:  None Criminal Activity/Legal Involvement Pertinent to Current Situation/Hospitalization:  No - Comment as needed Significant Relationships:  Siblings Lives with:  Facility Resident Do you feel safe going back to the place where you live?  Yes Need for family participation in patient care:  Yes (Comment)  Care giving concerns:  Patient lives at [facility] (group home)    Social Worker assessment / plan:  Patient is from a group home. CSW met with patient. He is confused but able to communicate. Patient states that he wants to return "home" when he can. CSW attempted to contact patient's guardian [name] [phone]. CSW was unable to reach guardian at this time. CSW also spoke with Janetta Hora, group home manager. She states that patient can return when ready. CSW will continue to try and reach guardian. CSW will continue to follow for discharge planning.   Employment status:  Disabled (Comment on whether or not currently receiving Disability) Insurance information:  Medicare PT Recommendations:  No Follow Up Information / Referral to community resources:     Patient/Family's Response to care:  Patient thanked CSW for coming to see him   Patient/Family's Understanding of and Emotional Response to Diagnosis, Current Treatment, and Prognosis:  Unable to reach  guardian at this time   Emotional Assessment Appearance:  Appears stated age Attitude/Demeanor/Rapport:    Affect (typically observed):  Quiet, Restless Orientation:  Oriented to Self, Oriented to Place Alcohol / Substance use:  Not Applicable Psych involvement (Current and /or in the community):  No (Comment)  Discharge Needs  Concerns to be addressed:  Discharge Planning Concerns Readmission within the last 30 days:  Yes Current discharge risk:  None Barriers to Discharge:  No Barriers Identified   Annamaria Boots, [place] 03/09/2018, 4:08 PM

## 2018-03-09 NOTE — Evaluation (Signed)
Physical Therapy Evaluation Patient Details Name: Mario Liu MRN: [id number] DOB: 1961/02/09 Today's Date: 03/09/2018   History of Present Illness  57 y/o male who was here 03/01/18 with general weakness, slurred speech, falls returns now with AMS that seems to have resolved. Imaging was negative for acute infarct then as now. Pt has a past medical history that includes down syndrome, COPD, hepatitis C, depression and a previous stroke on 05/14/17.  Clinical Impression  Pt was able to participate with prolonged ambulation (~350 ft) with no AD.  He was impulsive t/o the effort and needed constant cuing and redirection but physically was functional with no LOBs and good overall strength.  Difficult to fully assess baseline mental status in this group home resident with Down's Syndrome, but overall pt appears to be safe to return to group home.     Follow Up Recommendations No PT follow up    Equipment Recommendations  None recommended by PT    Recommendations for Other Services       Precautions / Restrictions Precautions Precautions: Fall Restrictions Weight Bearing Restrictions: No      Mobility  Bed Mobility Overal bed mobility: Independent                Transfers Overall transfer level: Independent   Transfers: Sit to/from Stand Sit to Stand: Supervision         General transfer comment: Supervision due to some impulsivity, but able to rise easily w/o phyiscal assist  Ambulation/Gait Ambulation/Gait assistance: Supervision Gait Distance (Feet): 350 Feet Assistive device: None       General Gait Details: Pt able to confidently ambulate at community appropriate speeds around the nurses' station.  needed constant directional cuing, but phyiscally was safe and appropriate - though impulsive (likely near baseline?)  Stairs            Wheelchair Mobility    Modified Rankin (Stroke Patients Only)       Balance Overall balance assessment: Modified  Independent(impulsivity was biggest issue, pure balance was [facility])                                           Pertinent Vitals/Pain Pain Assessment: No/denies pain    Home Living Family/patient expects to be discharged to:: Group home                      Prior Function           Comments: amb long distances on uneven surfaces with supervision at outpatient PT 07/15/17     Hand Dominance   Dominant Hand: Left    Extremity/Trunk Assessment   Upper Extremity Assessment Upper Extremity Assessment: Overall WFL for tasks assessed;Difficult to assess due to impaired cognition    Lower Extremity Assessment Lower Extremity Assessment: Overall WFL for tasks assessed;Difficult to assess due to impaired cognition       Communication   Communication: Expressive difficulties  Cognition Arousal/Alertness: Awake/alert Behavior During Therapy: Impulsive Overall Cognitive Status: History of cognitive impairments - at baseline                                        General Comments      Exercises     Assessment/Plan    PT Assessment Patent does  not need any further PT services  PT Problem List Decreased strength;Decreased range of motion;Decreased activity tolerance;Decreased balance;Decreased mobility;Decreased cognition;Decreased coordination;Decreased knowledge of use of DME;Decreased safety awareness       PT Treatment Interventions      PT Goals (Current goals can be found in the Care Plan section)  Acute Rehab PT Goals Patient Stated Goal: to go back to group home    Frequency     Barriers to discharge        Co-evaluation               AM-PAC PT "6 Clicks" Daily Activity  Outcome Measure Difficulty turning over in bed (including adjusting bedclothes, sheets and blankets)?: None Difficulty moving from lying on back to sitting on the side of the bed? : None Difficulty sitting down on and standing up from a chair  with arms (e.g., wheelchair, bedside commode, etc,.)?: None Help needed moving to and from a bed to chair (including a wheelchair)?: None Help needed walking in hospital room?: None Help needed climbing 3-5 steps with a railing? : None 6 Click Score: 24    End of Session Equipment Utilized During Treatment: Gait belt Activity Tolerance: Patient tolerated treatment well;No increased pain Patient left: with bed alarm set;with call bell/phone within reach Nurse Communication: Mobility status PT Visit Diagnosis: Unsteadiness on feet (R26.81);Other abnormalities of gait and mobility (R26.89);Repeated falls (R29.6);Muscle weakness (generalized) (M62.81);History of falling (Z91.81);Difficulty in walking, not elsewhere classified (R26.2)    Time: 3419-6222 PT Time Calculation (min) (ACUTE ONLY): 16 min   Charges:   PT Evaluation $PT Eval Low Complexity: 1 Low     PT G CodesKreg Shropshire, DPT 03/09/2018, 11:02 AM

## 2018-03-09 NOTE — Care Management Note (Signed)
Case Management Note  Patient Details  Name: Mario Liu MRN: [id number] Date of Birth: 1961-01-08  Subjective/Objective:   Re-admitted to [facility] from [facility] under observation status with the diagnosis of altered mental status.  Threasa Alpha NP-C is listed as primary care physician.  Sister is [name] and legal guardian  [phone]                 Action/Plan: Followed by [facility] for physical therapy in the home.   Expected Discharge Date:  03/09/18               Expected Discharge Plan:     In-House Referral:     Discharge planning Services     Post Acute Care Choice:    Choice offered to:     DME Arranged:    DME Agency:     HH Arranged:    HH Agency:     Status of Service:     If discussed at [employer], dates discussed:    Additional Comments:  Shelbie Ammons, RN MSN CCM Care Management [phone] 03/09/2018, 9:38 AM

## 2018-03-09 NOTE — Consult Note (Signed)
[facility] Consult   Reason for Consult: Consult for 57 year old man with chronic intellectual disability who comes to the hospital with altered mental status Referring Physician: Bobbye Charleston Patient Identification: Mario Liu MRN:  [id number] Principal Diagnosis: Altered mental status Diagnosis:   Patient Active Problem List   Diagnosis Date Noted  . Altered mental status [R41.82] 03/09/2018  . Recurrent falls [R29.6] 03/01/2018  . Seizure disorder ([place]) [K02.542] 11/24/2017  . Major depressive disorder [F32.9]   . Hepatitis C antibody test positive [R76.8] 05/16/2017  . Acute ischemic left MCA stroke ([place]) [I63.512]   . Down's syndrome [Q90.9]   . 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 male patient admitted with patient not able to give any information.  HPI: Patient seen chart reviewed.  Spoke with hospitalist today.  This 57 year old man with a history of Down syndrome was brought to the emergency room with complaints of altered mental status.  At first there was some consideration that he may have had a stroke although localizing symptoms seem to have resolved and nothing particular was found on CT scan.  Attempted to interview patient today and found him asleep and almost impossible to arouse.  Spoke to him loudly for several minutes and gently shook him on the shoulder but he never woke up enough to say anything more than his first name.  Could not answer any direct questions.  Nursing tells me that he was very different this morning when he was wide awake and appropriately interactive.  Social history: Lives in a group home.  Evidently the presenting mental status was a fairly abrupt change.  Substance abuse history: None reported  Medical history: Down syndrome, past history of stroke, hepatitis C, hypothyroid  Past Psychiatric History: No known past suicide attempts.  Has a  history of chronic intellectual disability.  Appears to be on modest doses of antidepressant medicine  Risk to Self:   Risk to Others:   Prior Inpatient Therapy:   Prior Outpatient Therapy:    Past Medical History:  Past Medical History:  Diagnosis Date  . COPD (chronic obstructive pulmonary disease) ([place])   . Hepatitis C   . Hx of cholecystectomy   . Hypothyroidism   . Kidney cysts   . Major depressive disorder    Was treated at facility in [place] ([facility]) years ago  . Stroke ([place])   . Tobacco abuse   . Vitamin D deficiency     Past Surgical History:  Procedure Laterality Date  . LAPAROSCOPIC CHOLECYSTECTOMY     Family History:  Family History  Adopted: Yes  Problem Relation Age of Onset  . Diabetes Maternal Grandmother    Family Psychiatric  History: None identified Social History:  Social History   Substance and Sexual Activity  Alcohol Use No     Social History   Substance and Sexual Activity  Drug Use No    Social History   Socioeconomic History  . Marital status: Single    Spouse name: Not on file  . Number of children: Not on file  . Years of education: Not on file  . Highest education level: Not on file  Occupational History  . Not on file  Social Needs  . Financial resource strain: Not on file  . Food insecurity:    Worry: Not on file    Inability: Not on file  .  Transportation needs:    Medical: Not on file    Non-medical: Not on file  Tobacco Use  . Smoking status: Current Every Day Smoker    Packs/day: 0.50    Years: 39.00    Pack years: 19.50    Types: Cigarettes  . Smokeless tobacco: Never Used  . Tobacco comment: pt is still smoking  Substance and Sexual Activity  . Alcohol use: No  . Drug use: No  . Sexual activity: Not on file  Lifestyle  . Physical activity:    Days per week: Not on file    Minutes per session: Not on file  . Stress: Not on file  Relationships  . Social connections:    Talks on phone: Not on file     Gets together: Not on file    Attends religious service: Not on file    Active member of club or organization: Not on file    Attends meetings of clubs or organizations: Not on file    Relationship status: Not on file  Other Topics Concern  . Not on file  Social History Narrative  . Not on file   Additional Social History:    Allergies:  No Known Allergies  Labs:  Results for orders placed or performed during the hospital encounter of 03/08/18 (from the past 48 hour(s))  Comprehensive metabolic panel     Status: Abnormal   Collection Time: 03/08/18 12:59 PM  Result Value Ref Range   Sodium 138 135 - 145 mmol/L   Potassium 4.0 3.5 - 5.1 mmol/L   Chloride 103 101 - 111 mmol/L   CO2 26 22 - 32 mmol/L   Glucose, Bld 132 (H) 65 - 99 mg/dL   BUN 17 6 - 20 mg/dL   Creatinine, Ser 1.10 0.61 - 1.24 mg/dL   Calcium 10.3 8.9 - 10.3 mg/dL   Total Protein 8.8 (H) 6.5 - 8.1 g/dL   Albumin 4.6 3.5 - 5.0 g/dL   AST 28 15 - 41 U/L   ALT 26 17 - 63 U/L   Alkaline Phosphatase 53 38 - 126 U/L   Total Bilirubin 0.7 0.3 - 1.2 mg/dL   GFR calc non Af Amer >60 >60 mL/min   GFR calc Af Amer >60 >60 mL/min    Comment: (NOTE) The eGFR has been calculated using the CKD EPI equation. This calculation has not been validated in all clinical situations. eGFR's persistently <60 mL/min signify possible Chronic Kidney Disease.    Anion gap 9 5 - 15    Comment: Performed at [facility], [address] [place], [place] [address]  CBC     Status: Abnormal   Collection Time: 03/08/18 12:59 PM  Result Value Ref Range   WBC 4.8 3.8 - 10.6 K/uL   RBC 5.27 4.40 - 5.90 MIL/uL   Hemoglobin 14.8 13.0 - 18.0 g/dL   HCT 45.7 40.0 - 52.0 %   MCV 86.6 80.0 - 100.0 fL   MCH 28.0 26.0 - 34.0 pg   MCHC 32.4 32.0 - 36.0 g/dL   RDW 13.3 11.5 - 14.5 %   Platelets 96 (L) 150 - 440 K/uL    Comment: Performed at [facility], [address] [place], [place] [address]  Troponin I     Status: None    Collection Time: 03/08/18 12:59 PM  Result Value Ref Range   Troponin I <0.03 <0.03 ng/mL    Comment: Performed at [facility], [address],  [place], [place] [address]  Brain natriuretic peptide     Status: None   Collection Time: 03/08/18 12:59 PM  Result Value Ref Range   B Natriuretic Peptide 15.0 0.0 - 100.0 pg/mL    Comment: Performed at [facility], [address] [place], [place] [address]  Urinalysis, Complete w Microscopic     Status: Abnormal   Collection Time: 03/08/18  2:25 PM  Result Value Ref Range   Color, Urine YELLOW (A) YELLOW   APPearance CLEAR (A) CLEAR   Specific Gravity, Urine 1.019 1.005 - 1.030   pH 5.0 5.0 - 8.0   Glucose, UA NEGATIVE NEGATIVE mg/dL   Hgb urine dipstick MODERATE (A) NEGATIVE   Bilirubin Urine NEGATIVE NEGATIVE   Ketones, ur 5 (A) NEGATIVE mg/dL   Protein, ur NEGATIVE NEGATIVE mg/dL   Nitrite NEGATIVE NEGATIVE   Leukocytes, UA NEGATIVE NEGATIVE   RBC / HPF >50 (H) 0 - 5 RBC/hpf   WBC, UA 0-5 0 - 5 WBC/hpf   Bacteria, UA NONE SEEN NONE SEEN   Squamous Epithelial / LPF 0-5 0 - 5   Mucus PRESENT    Hyaline Casts, UA PRESENT     Comment: Performed at [facility], [address] [place], [place] [address]  Urine Drug Screen, Qualitative     Status: Abnormal   Collection Time: 03/08/18  2:25 PM  Result Value Ref Range   Tricyclic, Ur Screen NONE DETECTED NONE DETECTED   Amphetamines, Ur Screen NONE DETECTED NONE DETECTED   MDMA (Ecstasy)Ur Screen NONE DETECTED NONE DETECTED   Cocaine Metabolite,Ur [place] NONE DETECTED NONE DETECTED   Opiate, Ur Screen NONE DETECTED NONE DETECTED   Phencyclidine (PCP) Ur S NONE DETECTED NONE DETECTED   Cannabinoid 50 Ng, Ur [place] NONE DETECTED NONE DETECTED   Barbiturates, Ur Screen (A) NONE DETECTED    Result not available. Reagent lot number recalled by manufacturer.   Benzodiazepine, Ur Scrn NONE DETECTED NONE DETECTED   Methadone Scn, Ur NONE DETECTED NONE DETECTED     Comment: (NOTE) Tricyclics + metabolites, urine    Cutoff 1000 ng/mL Amphetamines + metabolites, urine  Cutoff 1000 ng/mL MDMA (Ecstasy), urine              Cutoff 500 ng/mL Cocaine Metabolite, urine          Cutoff 300 ng/mL Opiate + metabolites, urine        Cutoff 300 ng/mL Phencyclidine (PCP), urine         Cutoff 25 ng/mL Cannabinoid, urine                 Cutoff 50 ng/mL Barbiturates + metabolites, urine  Cutoff 200 ng/mL Benzodiazepine, urine              Cutoff 200 ng/mL Methadone, urine                   Cutoff 300 ng/mL The urine drug screen provides only a preliminary, unconfirmed analytical test result and should not be used for non-medical purposes. Clinical consideration and professional judgment should be applied to any positive drug screen result due to possible interfering substances. A more specific alternate chemical method must be used in order to obtain a confirmed analytical result. Gas chromatography / mass spectrometry (GC/MS) is the preferred confirmat ory method. Performed at [facility], [address], [place], [place] [address]   Ammonia     Status: None   Collection Time: 03/08/18 11:42 PM  Result Value  Ref Range   Ammonia 17 9 - 35 umol/L    Comment: Performed at [facility], [address] [place], [place] [address]    Current Facility-Administered Medications  Medication Dose Route Frequency Provider Last Rate Last Dose  . acetaminophen (TYLENOL) tablet 650 mg  650 mg Oral Q6H PRN Arta Silence, MD       Or  . acetaminophen (TYLENOL) suppository 650 mg  650 mg Rectal Q6H PRN Jodell Cipro, Prasanna, MD      . albuterol (PROVENTIL) (2.5 MG/3ML) 0.083% nebulizer solution 2.5 mg  2.5 mg Nebulization Q6H PRN Arta Silence, MD      . aspirin EC tablet 325 mg  325 mg Oral Daily Arta Silence, MD   325 mg at 03/09/18 0820  . atorvastatin (LIPITOR) tablet 40 mg  40 mg Oral Daily Arta Silence, MD   40 mg at  03/09/18 0821  . bisacodyl (DULCOLAX) EC tablet 5 mg  5 mg Oral Daily PRN Arta Silence, MD      . buPROPion (WELLBUTRIN XL) 24 hr tablet 150 mg  150 mg Oral Daily Arta Silence, MD   150 mg at 03/09/18 0819  . calcium carbonate (TUMS - dosed in mg elemental calcium) chewable tablet 500 mg  500 mg Oral BID WC Arta Silence, MD   500 mg at 03/09/18 0821  . cholecalciferol (VITAMIN D) tablet 1,000 Units  1,000 Units Oral Daily Arta Silence, MD   1,000 Units at 03/09/18 0820  . citalopram (CELEXA) tablet 40 mg  40 mg Oral Daily Arta Silence, MD   40 mg at 03/09/18 0821  . clopidogrel (PLAVIX) tablet 75 mg  75 mg Oral Daily Arta Silence, MD   75 mg at 03/09/18 4259  . enoxaparin (LOVENOX) injection 40 mg  40 mg Subcutaneous Q24H Arta Silence, MD      . lacosamide (VIMPAT) tablet 200 mg  200 mg Oral BID Arta Silence, MD   200 mg at 03/09/18 0820  . ondansetron (ZOFRAN) tablet 4 mg  4 mg Oral Q6H PRN Arta Silence, MD       Or  . ondansetron (ZOFRAN) injection 4 mg  4 mg Intravenous Q6H PRN Arta Silence, MD      . senna-docusate (Senokot-S) tablet 1 tablet  1 tablet Oral QHS PRN Arta Silence, MD        Musculoskeletal: Strength & Muscle Tone: within normal limits Gait & Station: normal Patient leans: N/A  Psychiatric Specialty Exam: Physical Exam  Nursing note and vitals reviewed. Constitutional: He appears well-developed and well-nourished.  HENT:  Head: Normocephalic and atraumatic.  Eyes: Pupils are equal, round, and reactive to light. Conjunctivae are normal.  Neck: Normal range of motion.  Cardiovascular: Regular rhythm and normal heart sounds.  Respiratory: No respiratory distress.  GI: Soft.  Musculoskeletal: Normal range of motion.  Neurological: He is alert.  Skin: Skin is warm and dry.  Psychiatric: His affect is blunt. His speech is delayed. He is slowed. Cognition and memory are impaired. He expresses  inappropriate judgment. He is noncommunicative.    Review of Systems  Constitutional: Negative.   HENT: Negative.   Eyes: Negative.   Respiratory: Negative.   Cardiovascular: Negative.   Gastrointestinal: Negative.   Musculoskeletal: Negative.   Skin: Negative.   Neurological: Negative.   Psychiatric/Behavioral: Negative.     Blood pressure 104/79, pulse 87, temperature 97.7 F (36.5 C), resp. rate 18, height _0  (1.676 m), weight 87.1 kg (192 lb), SpO2 99 %.Body mass  index is 30.99 kg/m.  General Appearance: Fairly Groomed  Eye Contact:  None  Speech:  Slow  Volume:  Decreased  Mood:  Dysphoric  Affect:  Congruent  Thought Process:  Disorganized  Orientation:  Negative  Thought Content:  Negative  Suicidal Thoughts:  No  Homicidal Thoughts:  No  Memory:  Immediate;   Fair Recent;   Poor Remote;   Poor  Judgement:  Impaired  Insight:  Shallow  Psychomotor Activity:  Decreased  Concentration:  Concentration: Poor  Recall:  Poor  Fund of Knowledge:  Poor  Language:  Fair  Akathisia:  No  Handed:  Right  AIMS (if indicated):     Assets:  Resilience Social Support  ADL's:  Impaired  Cognition:  WNL  Sleep:        Treatment Plan Summary: Daily contact with patient to assess and evaluate symptoms and progress in treatment, Medication management and Plan 57 year old man with a history of Down syndrome comes into the hospital with altered mental status.  So far no specific medical etiology.  No sign of a new stroke.  Mental status appears to wax and wane and according to nursing seem to get worse after he took his morning medicine.  Looking in his medicine the only thing that is usually sedating that he took in the morning was the clonazepam.  I will go ahead and discontinue that.  Continue the antidepressant medicine.  Hopefully after getting some sleep he will be arousable and be able to give more history.  This may just be the reflection of his impaired neuro function  under some stress that he is shutting down.  No indication of need for admission to psychiatric ward or acute dangerousness.  Disposition: No evidence of imminent risk to self or others at present.   Patient does not meet criteria for psychiatric inpatient admission. Supportive therapy provided about ongoing stressors.  Alethia Berthold, MD 03/09/2018 3:29 PM

## 2018-03-09 NOTE — NC FL2 (Signed)
[place] LEVEL OF CARE SCREENING TOOL     IDENTIFICATION  Patient Name: Mario Liu Birthdate: 07-31-1961 Sex: male Admission Date (Current Location): 03/08/2018  [place] and [place] Number:  Engineering geologist and Address:  [facility], [address] [place], [place] [address]      Provider Number: [id number]  Attending Physician Name and Address:  Mario Grayer, MD  Relative Name and Phone Number:  Mario Liu, guardian [phone]    Current Level of Care: Hospital Recommended Level of Care: [facility] Prior Approval Number:    Date Approved/Denied:   PASRR Number:    Discharge Plan: Other (Comment)    Current Diagnoses: Patient Active Problem List   Diagnosis Date Noted  . Altered mental status 03/09/2018  . Recurrent falls 03/01/2018  . Seizure disorder ([place]) 11/24/2017  . Major depressive disorder   . Hepatitis C antibody test positive 05/16/2017  . Acute ischemic left MCA stroke ([place])   . Down's syndrome   . PFO with atrial septal aneurysm     Orientation RESPIRATION BLADDER Height & Weight     Self, Place  Normal Continent Weight: 192 lb (87.1 kg) Height:  5\' 6"  (167.6 cm)  BEHAVIORAL SYMPTOMS/MOOD NEUROLOGICAL BOWEL NUTRITION STATUS  (none) (none) Continent Diet(Heart Healthy )  AMBULATORY STATUS COMMUNICATION OF NEEDS Skin   Limited Assist Verbally Normal                       Personal Care Assistance Level of Assistance  Bathing, Feeding, Dressing Bathing Assistance: Limited assistance Feeding assistance: Independent Dressing Assistance: Limited assistance     Functional Limitations Info  Sight, Hearing, Speech Sight Info: Adequate Hearing Info: Adequate Speech Info: Adequate    SPECIAL CARE FACTORS FREQUENCY                       Contractures Contractures Info: Not present    Additional Factors Info  Code Status, Allergies Code Status Info: Full Code  Allergies  Info: NKA           Current Medications (03/09/2018):  This is the current hospital active medication list Current Facility-Administered Medications  Medication Dose Route Frequency Provider Last Rate Last Dose  . acetaminophen (TYLENOL) tablet 650 mg  650 mg Oral Q6H PRN Arta Silence, MD       Or  . acetaminophen (TYLENOL) suppository 650 mg  650 mg Rectal Q6H PRN Jodell Cipro, Prasanna, MD      . albuterol (PROVENTIL) (2.5 MG/3ML) 0.083% nebulizer solution 2.5 mg  2.5 mg Nebulization Q6H PRN Arta Silence, MD      . aspirin EC tablet 325 mg  325 mg Oral Daily Arta Silence, MD   325 mg at 03/09/18 0820  . atorvastatin (LIPITOR) tablet 40 mg  40 mg Oral Daily Arta Silence, MD   40 mg at 03/09/18 0821  . bisacodyl (DULCOLAX) EC tablet 5 mg  5 mg Oral Daily PRN Arta Silence, MD      . buPROPion (WELLBUTRIN XL) 24 hr tablet 150 mg  150 mg Oral Daily Arta Silence, MD   150 mg at 03/09/18 0819  . calcium carbonate (TUMS - dosed in mg elemental calcium) chewable tablet 500 mg  500 mg Oral BID WC Arta Silence, MD   500 mg at 03/09/18 0821  . cholecalciferol (VITAMIN D) tablet 1,000 Units  1,000 Units Oral Daily Arta Silence, MD   1,000 Units at 03/09/18  0820  . citalopram (CELEXA) tablet 40 mg  40 mg Oral Daily Arta Silence, MD   40 mg at 03/09/18 0821  . clopidogrel (PLAVIX) tablet 75 mg  75 mg Oral Daily Arta Silence, MD   75 mg at 03/09/18 2409  . enoxaparin (LOVENOX) injection 40 mg  40 mg Subcutaneous Q24H Arta Silence, MD      . lacosamide (VIMPAT) tablet 200 mg  200 mg Oral BID Arta Silence, MD   200 mg at 03/09/18 0820  . ondansetron (ZOFRAN) tablet 4 mg  4 mg Oral Q6H PRN Arta Silence, MD       Or  . ondansetron (ZOFRAN) injection 4 mg  4 mg Intravenous Q6H PRN Arta Silence, MD      . senna-docusate (Senokot-S) tablet 1 tablet  1 tablet Oral QHS PRN Arta Silence, MD         Discharge  Medications: Please see discharge summary for a list of discharge medications.  Relevant Imaging Results:  Relevant Lab Results:   Additional Information    Mario Liu  Louretta Shorten, LCSWA

## 2018-03-09 NOTE — Care Management Obs Status (Signed)
[place] NOTIFICATION   Patient Details  Name: Mario Liu MRN: [id number] Date of Birth: 1960/12/30   Medicare Observation Status Notification Given:  Yes: Sister is [name] Moore/guardian    Shelbie Ammons, RN 03/09/2018, 9:36 AM

## 2018-03-10 DIAGNOSIS — R400 Somnolence: Secondary | ICD-10-CM | POA: Diagnosis not present

## 2018-03-10 DIAGNOSIS — G40909 Epilepsy, unspecified, not intractable, without status epilepticus: Secondary | ICD-10-CM | POA: Diagnosis not present

## 2018-03-10 LAB — URINE CULTURE: Culture: <10000 — AB

## 2018-03-10 NOTE — Consult Note (Signed)
[place] [facility] Consult   Reason for Consult: Follow-up for 57 year old man with a history of stroke Down syndrome past history of major depression. Referring Physician: Bobbye [place] Patient Identification: Mario Liu MRN:  [id number] Principal Diagnosis: Altered mental status Diagnosis:   Patient Active Problem List   Diagnosis Date Noted  . Altered mental status [R41.82] 03/09/2018  . Recurrent falls [R29.6] 03/01/2018  . Seizure disorder ([place]) [I09.735] 11/24/2017  . Major depressive disorder [F32.9]   . Hepatitis C antibody test positive [R76.8] 05/16/2017  . Acute ischemic left MCA stroke ([place]) [I63.512]   . Down's syndrome [Q90.9]   . 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 male patient admitted with "I had a stroke".  HPI: Patient seen chart reviewed.  Patient was much more alert and interactive today.  He made good eye contact and responded to questions although he is very dysarthric and often difficult to understand.  His chief complaint was his belief that he had a stroke.  He was not able to describe that in any more detail.  He denies any problems with his mood currently.  Denies any current hallucinations denies suicidal or homicidal thought.  Patient was seen by therapist today including speech therapy who found that he was able to do some swallowing.  Past Psychiatric History: Patient has a history of being treated for mental health problems and also a history of Down syndrome and chronic developmental disability.  Risk to Self:   Risk to Others:   Prior Inpatient Therapy:   Prior Outpatient Therapy:    Past Medical History:  Past Medical History:  Diagnosis Date  . COPD (chronic obstructive pulmonary disease) ([place])   . Hepatitis C   . Hx of cholecystectomy   . Hypothyroidism   . Kidney cysts   . Major depressive disorder    Was treated at facility in [place] (D and  G) years ago  . Stroke ([place])   . Tobacco abuse   . Vitamin D deficiency     Past Surgical History:  Procedure Laterality Date  . LAPAROSCOPIC CHOLECYSTECTOMY     Family History:  Family History  Adopted: Yes  Problem Relation Age of Onset  . Diabetes Maternal Grandmother    Family Psychiatric  History: Unknown Social History:  Social History   Substance and Sexual Activity  Alcohol Use No     Social History   Substance and Sexual Activity  Drug Use No    Social History   Socioeconomic History  . Marital status: Single    Spouse name: Not on file  . Number of children: Not on file  . Years of education: Not on file  . Highest education level: Not on file  Occupational History  . Not on file  Social Needs  . Financial resource strain: Not on file  . Food insecurity:    Worry: Not on file    Inability: Not on file  . Transportation needs:    Medical: Not on file    Non-medical: Not on file  Tobacco Use  . Smoking status: Current Every Day Smoker    Packs/day: 0.50    Years: 39.00    Pack years: 19.50    Types: Cigarettes  . Smokeless tobacco: Never Used  . Tobacco comment: pt is still smoking  Substance and Sexual Activity  . Alcohol use: No  . Drug use: No  .  Sexual activity: Not on file  Lifestyle  . Physical activity:    Days per week: Not on file    Minutes per session: Not on file  . Stress: Not on file  Relationships  . Social connections:    Talks on phone: Not on file    Gets together: Not on file    Attends religious service: Not on file    Active member of club or organization: Not on file    Attends meetings of clubs or organizations: Not on file    Relationship status: Not on file  Other Topics Concern  . Not on file  Social History Narrative  . Not on file   Additional Social History:    Allergies:  No Known Allergies  Labs:  Results for orders placed or performed during the hospital encounter of 03/08/18 (from the past 48  hour(s))  Urine Culture     Status: Abnormal   Collection Time: 03/08/18  6:27 PM  Result Value Ref Range   Specimen Description      URINE, RANDOM Performed at [facility], [address] [place], [place] [address]    Special Requests      NONE Performed at [facility], [address] [place], [place] [address]    Culture (A)     <10,000 COLONIES/mL INSIGNIFICANT GROWTH Performed at [facility] [address], [place], [place] [address]    Report Status 03/10/2018 FINAL   Ammonia     Status: None   Collection Time: 03/08/18 11:42 PM  Result Value Ref Range   Ammonia 17 9 - 35 umol/L    Comment: Performed at [facility], [place] [place], [place] [address]    Current Facility-Administered Medications  Medication Dose Route Frequency Provider Last Rate Last Dose  . acetaminophen (TYLENOL) tablet 650 mg  650 mg Oral Q6H PRN Arta Silence, MD       Or  . acetaminophen (TYLENOL) suppository 650 mg  650 mg Rectal Q6H PRN Jodell Cipro, Prasanna, MD      . albuterol (PROVENTIL) (2.5 MG/3ML) 0.083% nebulizer solution 2.5 mg  2.5 mg Nebulization Q6H PRN Arta Silence, MD      . aspirin EC tablet 325 mg  325 mg Oral Daily Arta Silence, MD   325 mg at 03/10/18 0744  . bisacodyl (DULCOLAX) EC tablet 5 mg  5 mg Oral Daily PRN Arta Silence, MD      . buPROPion (WELLBUTRIN XL) 24 hr tablet 150 mg  150 mg Oral Daily Arta Silence, MD   150 mg at 03/10/18 0743  . calcium carbonate (TUMS - dosed in mg elemental calcium) chewable tablet 500 mg  500 mg Oral BID WC Arta Silence, MD   500 mg at 03/10/18 0744  . cholecalciferol (VITAMIN D) tablet 1,000 Units  1,000 Units Oral Daily Arta Silence, MD   1,000 Units at 03/10/18 0744  . citalopram (CELEXA) tablet 40 mg  40 mg Oral Daily Arta Silence, MD   40 mg at 03/10/18 0744  . clopidogrel (PLAVIX) tablet 75 mg  75 mg Oral Daily Arta Silence, MD   75  mg at 03/10/18 0744  . enoxaparin (LOVENOX) injection 40 mg  40 mg Subcutaneous Q24H Arta Silence, MD   40 mg at 03/09/18 2128  . lacosamide (VIMPAT) tablet 200 mg  200 mg Oral BID Arta Silence, MD   200 mg at 03/10/18 0743  . ondansetron (ZOFRAN) tablet 4 mg  4  mg Oral Q6H PRN Arta Silence, MD       Or  . ondansetron (ZOFRAN) injection 4 mg  4 mg Intravenous Q6H PRN Arta Silence, MD      . senna-docusate (Senokot-S) tablet 1 tablet  1 tablet Oral QHS PRN Arta Silence, MD        Musculoskeletal: Strength & Muscle Tone: decreased Gait & Station: unsteady Patient leans: N/A  Psychiatric Specialty Exam: Physical Exam  Nursing note and vitals reviewed. Constitutional: He appears well-developed and well-nourished.  HENT:  Head: Normocephalic and atraumatic.  Eyes: Pupils are equal, round, and reactive to light. Conjunctivae are normal.  Neck: Normal range of motion.  Cardiovascular: Normal heart sounds.  Respiratory: Effort normal.  GI: Soft.  Musculoskeletal: Normal range of motion.  Neurological: He is alert.  Dysarthric with some decreased coordination  Skin: Skin is warm and dry.  Psychiatric: He has a normal mood and affect. His speech is delayed. He is slowed. Thought content is not paranoid. Cognition and memory are impaired. He expresses inappropriate judgment. He expresses no homicidal and no suicidal ideation.    Review of Systems  Constitutional: Negative.   HENT: Negative.   Eyes: Negative.   Respiratory: Negative.   Cardiovascular: Negative.   Gastrointestinal: Negative.   Musculoskeletal: Negative.   Skin: Negative.   Neurological: Negative.   Psychiatric/Behavioral: Positive for memory loss. Negative for depression, hallucinations, substance abuse and suicidal ideas. The patient is not nervous/anxious and does not have insomnia.     Blood pressure 108/86, pulse 72, temperature 98.2 F (36.8 C), temperature source Oral, resp.  rate 20, height 5\' 6"  (1.676 m), weight 87.1 kg (192 lb), SpO2 99 %.Body mass index is 30.99 kg/m.  General Appearance: Fairly Groomed  Eye Contact:  Fair  Speech:  Garbled and Slow  Volume:  Decreased  Mood:  Euthymic  Affect:  Congruent  Thought Process:  Goal Directed  Orientation:  Full (Time, Place, and Person)  Thought Content:  Logical  Suicidal Thoughts:  No  Homicidal Thoughts:  No  Memory:  Immediate;   Fair Recent;   Poor Remote;   Poor  Judgement:  Impaired  Insight:  Shallow  Psychomotor Activity:  Decreased  Concentration:  Concentration: Poor  Recall:  [place] of Knowledge:  Poor  Language:  Poor  Akathisia:  No  Handed:  Right  AIMS (if indicated):     Assets:  Housing Resilience  ADL's:  Impaired  Cognition:  Impaired,  Mild and Moderate  Sleep:        Treatment Plan Summary: Medication management and Plan 57 year old man seems to have returned to his baseline mental state.  Unclear why he was so difficult to interview yesterday.  Today he is eating better and is able to converse more.  No sign of acute mood or psychotic disorder.  No acute change in out of medication.  We will follow-up as needed.  Disposition: No evidence of imminent risk to self or others at present.   Supportive therapy provided about ongoing stressors.  Alethia Berthold, MD 03/10/2018 3:51 PM

## 2018-03-10 NOTE — Evaluation (Signed)
Clinical/Bedside Swallow Evaluation Patient Details  Name: Mario Liu MRN: [id number] Date of Birth: [birth year]  Today's Date: 03/10/2018 Time: SLP Start Time (ACUTE ONLY): 34 SLP Stop Time (ACUTE ONLY): 1305 SLP Time Calculation (min) (ACUTE ONLY): 35 min  Past Medical History:  Past Medical History:  Diagnosis Date  . COPD (chronic obstructive pulmonary disease) ([place])   . Hepatitis C   . Hx of cholecystectomy   . Hypothyroidism   . Kidney cysts   . Major depressive disorder    Was treated at facility in [place] (D and G) years ago  . Stroke ([place])   . Tobacco abuse   . Vitamin D deficiency    Past Surgical History:  Past Surgical History:  Procedure Laterality Date  . LAPAROSCOPIC CHOLECYSTECTOMY     HPI: Per admitting [place] and Physical:   Mario Liu  is a 57 y.o. male with a known history of depression, CVA (w/ residual dysarthria), Down's syndrome who presents from group home w/ AMS. Relevant Hx/ROS unobtainable from pt, given his condition. Pt was reported to be somnolent/lethargic and unable to walk. I found Mario Liu sleeping comfortably with a blanket over his head. When I woke him, he sat upright in bed without any difficulty or need for assistance. He was able to tell me he is in the hospital and it is 2019. His speech is dysarthric and he is somewhat difficult to understand, but he responds appropriately and to the best of his abilities, and follows simple commands.    Assessment / Plan / Recommendation Clinical Impression  Pt. presents with mild oral phase dysphagia likely secondary to previous CVA. No s/s of aspiration with any tested consistency. Oral mech exam revealed overall oral motor weakness. Pt. presents with oral transit delay with solids and mild to moderate oral residue after the swallow. Pt was able to clear oral cavity with alternating thin liquids. Pt tolerated all other boluses without difficulty. Laryngeal elevation appeared adequate, vocal quality  remained clear. Rec dys 3 diet with thin liquids. Monitor for s/s of aspiration. May need cues to eat and drink slowly and alternate liquids with solids. Intermittent supervision needed. ST to f/u with toleration of diet. SLP Visit Diagnosis: Dysphagia, oropharyngeal phase (R13.12)    Aspiration Risk  Mild aspiration risk    Diet Recommendation Dysphagia 3 (Mech soft);Thin liquid   Liquid Administration via: Cup;Straw Medication Administration: Whole meds with puree Compensations: Minimize environmental distractions;Slow rate;Small sips/bites;Follow solids with liquid Postural Changes: Seated upright at 90 degrees;Remain upright for at least 30 minutes after po intake    Other  Recommendations     Follow up Recommendations  Supervision at meals to ensure slow rate.      Frequency and Duration min 1 x/week  1 week       Prognosis   Good     Swallow Study   General Type of Study: Bedside Swallow Evaluation Diet Prior to this Study: Regular Temperature Spikes Noted: No Respiratory Status: Room air History of Recent Intubation: No Behavior/Cognition: Alert;Cooperative;Pleasant mood Oral Cavity Assessment: Within Functional Limits Oral Care Completed by SLP: No Oral Cavity - Dentition: Adequate natural dentition Vision: Functional for self-feeding Self-Feeding Abilities: Needs set up Patient Positioning: Upright in bed Baseline Vocal Quality: Normal    Oral/Motor/Sensory Function Overall Oral Motor/Sensory Function: Within functional limits   Ice Chips Ice chips: Within functional limits   Thin Liquid Thin Liquid: Within functional limits Presentation: Cup;Spoon;Straw    Nectar Thick Nectar Thick Liquid: Not tested  Honey Thick Honey Thick Liquid: Not tested   Puree Puree: Within functional limits   Solid   GO   Solid: Impaired Presentation: Self Fed Oral Phase Impairments: Reduced lingual movement/coordination Oral Phase Functional Implications: Prolonged oral  transit;Right lateral sulci pocketing        Mario Liu 03/10/2018,1:05 PM

## 2018-03-10 NOTE — Progress Notes (Signed)
Patient ID: Mario Liu, male   DOB: Sep 28, 1960, 57 y.o.   MRN: [id number]  [employer] PROGRESS NOTE  Mario Liu YYT:[id number] DOB: 1960-12-16 DOA: 03/08/2018 PCP: [name], FNP  HPI/Subjective: Patient again today when I saw him opened up his eyes try to answer few questions and went back to sleep.  Objective: Vitals:   03/10/18 0614 03/10/18 1408  BP: 108/77 108/86  Pulse: 75 72  Resp: 19 20  Temp: 98.3 F (36.8 C) 98.2 F (36.8 C)  SpO2: 100% 99%   No intake or output data in the 24 hours ending 03/10/18 1417 Filed Weights   03/08/18 1244  Weight: 87.1 kg (192 lb)    ROS: Review of Systems  Unable to perform ROS: Acuity of condition  Respiratory: Negative for shortness of breath.   Cardiovascular: Negative for chest pain.   Exam: Physical Exam  Constitutional: He appears lethargic.  HENT:  Nose: No mucosal edema.  Mouth/Throat: No oropharyngeal exudate or posterior oropharyngeal edema.  Eyes: Pupils are equal, round, and reactive to light. Conjunctivae, EOM and lids are normal.  Neck: No JVD present. Carotid bruit is not present. No edema present. No thyroid mass and no thyromegaly present.  Cardiovascular: S1 normal and S2 normal. Exam reveals no gallop.  No murmur heard. Pulses:      Dorsalis pedis pulses are 2+ on the right side, and 2+ on the left side.  Respiratory: No respiratory distress. He has no wheezes. He has no rhonchi. He has no rales.  GI: Soft. Bowel sounds are normal. There is no tenderness.  Musculoskeletal:       Right ankle: He exhibits no swelling.       Left ankle: He exhibits no swelling.  Lymphadenopathy:    He has no cervical adenopathy.  Neurological: He appears lethargic.  Skin: Skin is warm. No rash noted. Nails show no clubbing.  Psychiatric:  Lethargic      Data Reviewed: Basic Metabolic Panel: Recent Labs  Lab 03/08/18 1259  NA 138  K 4.0  CL 103  CO2 26  GLUCOSE 132*  BUN 17  CREATININE 1.10   CALCIUM 10.3   Liver Function Tests: Recent Labs  Lab 03/08/18 1259  AST 28  ALT 26  ALKPHOS 53  BILITOT 0.7  PROT 8.8*  ALBUMIN 4.6    Recent Labs  Lab 03/08/18 2342  AMMONIA 17   CBC: Recent Labs  Lab 03/08/18 1259  WBC 4.8  HGB 14.8  HCT 45.7  MCV 86.6  PLT 96*   Cardiac Enzymes: Recent Labs  Lab 03/08/18 1259  TROPONINI <0.03   BNP (last 3 results) Recent Labs    03/08/18 1259  BNP 15.0   She is  Recent Results (from the past 240 hour(s))  MRSA PCR Screening     Status: None   Collection Time: 03/01/18 10:43 PM  Result Value Ref Range Status   MRSA by PCR NEGATIVE NEGATIVE Final    Comment:        The GeneXpert MRSA Assay (FDA approved for NASAL specimens only), is one component of a comprehensive MRSA colonization surveillance program. It is not intended to diagnose MRSA infection nor to guide or monitor treatment for MRSA infections. Performed at [facility], [address], [place], [place] [address]   Urine Culture     Status: None   Collection Time: 03/03/18  9:27 AM  Result Value Ref Range Status   Specimen Description   Final  URINE, RANDOM Performed at [facility], [address], [place], [place] [address]    Special Requests   Final    NONE Performed at [facility], [address] [place], [place] [address]    Culture   Final    NO GROWTH Performed at [facility], [place] [address] [place], [place] [address]    Report Status 03/04/2018 FINAL  Final  Urine Culture     Status: Abnormal   Collection Time: 03/08/18  6:27 PM  Result Value Ref Range Status   Specimen Description   Final    URINE, RANDOM Performed at [facility], [address], [place], [place] [address]    Special Requests   Final    NONE Performed at [facility], [address] [place], [place] [address]    Culture (A)  Final    <10,000 COLONIES/mL INSIGNIFICANT GROWTH Performed at [facility], [place] [address], [place],  [address]    Report Status 03/10/2018 FINAL  Final     Studies: Ct Head Wo Contrast  Result Date: 03/08/2018 CLINICAL DATA:  57 y/o M; unexplained altered level of consciousness. EXAM: CT HEAD WITHOUT CONTRAST TECHNIQUE: Contiguous axial images were obtained from the base of the skull through the vertex without intravenous contrast. COMPARISON:  03/02/2018 MRI head.  03/01/2018 CT head. FINDINGS: Brain: Stable distribution of chronic left posterior MCA infarction. No acute stroke, hemorrhage, focal mass effect. No extra-axial collection, hydrocephalus, or effacement of basilar cisterns. Vascular: Calcific atherosclerosis of carotid siphons. No hyperdense vessel identified. Skull: Normal. Negative for fracture or focal lesion. Sinuses/Orbits: Partial opacification of ethmoid air cells and right sphenoid sinus mucous retention cyst in the left maxillary sinus. Normal aeration mastoid air cells. Orbits are unremarkable. Other: None. IMPRESSION: 1. No acute intracranial abnormality. 2. Stable chronic left posterior MCA distribution infarct. Electronically Signed   By: Kristine Garbe M.D.   On: 03/08/2018 15:32   Mr Brain Wo Contrast  Result Date: 03/08/2018 CLINICAL DATA:  Altered level of consciousness. Recent urinary tract infection. EXAM: MRI HEAD WITHOUT CONTRAST TECHNIQUE: Multiplanar, multiecho pulse sequences of the brain and surrounding structures were obtained without intravenous contrast. COMPARISON:  CT head without contrast 03/08/2018. MRI brain 03/02/2018 and 05/16/2017. FINDINGS: Brain: Remote encephalomalacia of the left temporal and parietal lobe is again seen. The ventricles are of normal size. No significant hemorrhage or mass lesion is present. No acute infarct is present. Brainstem and cerebellum are normal. Vascular: Flow is present in the major intracranial arteries. Skull and upper cervical spine: Skull base is within normal  limits. Craniocervical junction is normal. There is significant patient motion on the sagittal images. Upper cervical spine is grossly within normal limits. Sinuses/Orbits: Polyp or mucous retention cyst is noted in the right sphenoid sinus left maxillary sinus. There is mild mucosal thickening in the ethmoid air cells. No fluid levels present. Globes and orbits are within normal limits. IMPRESSION: 1. No acute intracranial abnormality. 2. Stable chronic encephalomalacia involving the left parietal and temporal lobe. Electronically Signed   By: San Morelle M.D.   On: 03/08/2018 17:19   Dg Chest Portable 1 View  Result Date: 03/08/2018 CLINICAL DATA:  Disoriented all weekend. Recent hospitalization for UTI. EXAM: PORTABLE CHEST 1 VIEW COMPARISON:  Chest x-ray dated 03/01/2018. FINDINGS: Cardiomediastinal silhouette is stable in size and configuration. There is mild central pulmonary vascular congestion without overt alveolar pulmonary edema. No confluent opacity to suggest developing pneumonia. No  pleural effusion or pneumothorax seen. No acute or suspicious osseous finding. IMPRESSION: 1. Mild central pulmonary vascular congestion without overt alveolar pulmonary edema. 2. No evidence of pneumonia. Electronically Signed   By: Franki Cabot M.D.   On: 03/08/2018 14:46    Scheduled Meds: . aspirin EC  325 mg Oral Daily  . atorvastatin  40 mg Oral Daily  . buPROPion  150 mg Oral Daily  . calcium carbonate  500 mg Oral BID WC  . cholecalciferol  1,000 Units Oral Daily  . citalopram  40 mg Oral Daily  . clopidogrel  75 mg Oral Daily  . enoxaparin (LOVENOX) injection  40 mg Subcutaneous Q24H  . lacosamide  200 mg Oral BID   Continuous Infusions:  Assessment/Plan:  1. Acute encephalopathy.  Patient sleeping again when I saw him.  Unable to make good assessment on the cause.  MRI of the brain is negative.  I saw the patient walking around with physical therapy yesterday.  Spoke with the  patient's sister who is his guardian and he is been sleeping a lot.  She was interested in a higher level of care but when he was with physical therapy he walked around the nursing station too well.  Psychiatry yesterday he stopped his Klonopin.. 2. Chronic thrombocytopenia and hepatitis C likely this is the cause.  3. History of stroke on aspirin Plavix and statin. 4. Hyperlipidemia unspecified.  Will stop atorvastatin with altered mental status 5. History of seizures on Vimpat  Code Status:     Code Status Orders  (From admission, onward)        Start     Ordered   03/09/18 0411  Full code  Continuous     03/09/18 0410    Code Status History    Date Active Date Inactive Code Status Order ID Comments User Context   03/01/2018 2228 03/03/2018 1724 Full Code [id number]  Dustin Flock, MD Inpatient   05/18/2017 1552 05/26/2017 1637 Full Code [id number]  Flora Lipps Inpatient   05/18/2017 1552 05/18/2017 1552 Full Code [id number]  Flora Lipps Inpatient   05/14/2017 1847 05/18/2017 1542 Full Code [id number]  Maryellen Pile, MD Inpatient     Family Communication: Spoke with sister who is his legal guardian Disposition Plan: Hopefully back to group home soon  Time spent: 26 minutes  [place]

## 2018-03-10 NOTE — Clinical Social Work Note (Signed)
Patient's guardian [name] [phone] called CSW back. CSW explained role and asked guardian if patient could return to group home. Guardian states that she was trying to get patient into a higher level of care with his primary care physician. CSW explained that therapy has evaluated him here and we do not believe he needs a higher level of care at this time. Guardian states that she understands and is okay with patient returning to group home. CSW will continue to follow for discharge planning.   Media, [place]

## 2018-03-11 DIAGNOSIS — R400 Somnolence: Secondary | ICD-10-CM | POA: Diagnosis not present

## 2018-03-11 DIAGNOSIS — G40909 Epilepsy, unspecified, not intractable, without status epilepticus: Secondary | ICD-10-CM | POA: Diagnosis not present

## 2018-03-11 LAB — GLUCOSE, CAPILLARY: Glucose-Capillary: 91 mg/dL (ref 70–99)

## 2018-03-11 MED ORDER — TRAZODONE HCL 50 MG PO TABS: 50.0000 mg | ORAL_TABLET | Freq: Every day | ORAL | Status: AC

## 2018-03-11 MED ORDER — CLONAZEPAM 0.5 MG PO TABS: 0.5000 mg | ORAL_TABLET | Freq: Every day | ORAL | Status: DC

## 2018-03-11 MED ORDER — RISPERIDONE 1 MG PO TABS
4.0000 mg | ORAL_TABLET | Freq: Every day | ORAL | Status: DC
  Filled 2018-03-11: qty 1

## 2018-03-11 MED ORDER — TRAZODONE HCL 50 MG PO TABS: 50.0000 mg | ORAL_TABLET | Freq: Every day | ORAL | Status: DC

## 2018-03-11 NOTE — Care Management (Signed)
Discharge to home today per Dr. Leslye Peer. Requested resumption of Home Health orders. Requested rolling walker. Jason from [facility] updated.  Shelbie Ammons RN MSN CCM Care Management [phone]

## 2018-03-11 NOTE — Clinical Social Work Note (Signed)
Patient is medically ready for discharge today. CSW contacted patient's Beryl Meager [phone]. She is in agreement with discharge today. CS[facility] also contacted [facility]. They will be picking up patient around 1pm today. CSW notified RN of pick up time.   [place], [place]

## 2018-03-11 NOTE — Consult Note (Signed)
[facility] Consult   Reason for Consult: Follow-up consult 57 year old man with history of developmental disability came into the hospital with altered mental status Referring Physician: Bobbye Charleston Patient Identification: Mario Liu MRN:  [id number] Principal Diagnosis: Altered mental status Diagnosis:   Patient Active Problem List   Diagnosis Date Noted  . Altered mental status [R41.82] 03/09/2018  . Recurrent falls [R29.6] 03/01/2018  . Seizure disorder ([place]) [W09.811] 11/24/2017  . Major depressive disorder [F32.9]   . Hepatitis C antibody test positive [R76.8] 05/16/2017  . Acute ischemic left MCA stroke ([place]) [I63.512]   . Down's syndrome [Q90.9]   . 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 male patient admitted with patient came into the hospital with altered mental status of unclear etiology.  When I saw him initially he was delirious and unable to give any information.  Medications were held in order to clarify the situation.  At this point the patient seems to have recovered back to his baseline.  Seen this morning the patient says he is feeling "fine".  He knows where he is and he is able to basically explain why he is here in the hospital.  He is upbeat and cheerful and looking forward to going home.Marland Kitchen  HPI: See previous notes this is a patient who has Down syndrome and several other medical problems who came into the hospital with altered mental status  Past Psychiatric History: Chronic developmental disability  Risk to Self:   Risk to Others:   Prior Inpatient Therapy:   Prior Outpatient Therapy:    Past Medical History:  Past Medical History:  Diagnosis Date  . COPD (chronic obstructive pulmonary disease) ([place])   . Hepatitis C   . Hx of cholecystectomy   . Hypothyroidism   . Kidney cysts   . Major depressive disorder    Was treated at facility in [place] (D and  G) years ago  . Stroke ([place])   . Tobacco abuse   . Vitamin D deficiency     Past Surgical History:  Procedure Laterality Date  . LAPAROSCOPIC CHOLECYSTECTOMY     Family History:  Family History  Adopted: Yes  Problem Relation Age of Onset  . Diabetes Maternal Grandmother    Family Psychiatric  History: See previous notes Social History:  Social History   Substance and Sexual Activity  Alcohol Use No     Social History   Substance and Sexual Activity  Drug Use No    Social History   Socioeconomic History  . Marital status: Single    Spouse name: Not on file  . Number of children: Not on file  . Years of education: Not on file  . Highest education level: Not on file  Occupational History  . Not on file  Social Needs  . Financial resource strain: Not on file  . Food insecurity:    Worry: Not on file    Inability: Not on file  . Transportation needs:    Medical: Not on file    Non-medical: Not on file  Tobacco Use  . Smoking status: Current Every Day Smoker    Packs/day: 0.50    Years: 39.00    Pack years: 19.50    Types: Cigarettes  . Smokeless tobacco: Never Used  . Tobacco comment: pt is still smoking  Substance and Sexual Activity  . Alcohol use: No  .  Drug use: No  . Sexual activity: Not on file  Lifestyle  . Physical activity:    Days per week: Not on file    Minutes per session: Not on file  . Stress: Not on file  Relationships  . Social connections:    Talks on phone: Not on file    Gets together: Not on file    Attends religious service: Not on file    Active member of club or organization: Not on file    Attends meetings of clubs or organizations: Not on file    Relationship status: Not on file  Other Topics Concern  . Not on file  Social History Narrative  . Not on file   Additional Social History:    Allergies:  No Known Allergies  Labs:  Results for orders placed or performed during the hospital encounter of 03/08/18 (from the  past 48 hour(s))  Glucose, capillary     Status: None   Collection Time: 03/11/18  7:34 AM  Result Value Ref Range   Glucose-Capillary 91 70 - 99 mg/dL    Current Facility-Administered Medications  Medication Dose Route Frequency Provider Last Rate Last Dose  . acetaminophen (TYLENOL) tablet 650 mg  650 mg Oral Q6H PRN Arta Silence, MD       Or  . acetaminophen (TYLENOL) suppository 650 mg  650 mg Rectal Q6H PRN Jodell Cipro, Prasanna, MD      . albuterol (PROVENTIL) (2.5 MG/3ML) 0.083% nebulizer solution 2.5 mg  2.5 mg Nebulization Q6H PRN Arta Silence, MD      . aspirin EC tablet 325 mg  325 mg Oral Daily Arta Silence, MD   325 mg at 03/11/18 0953  . bisacodyl (DULCOLAX) EC tablet 5 mg  5 mg Oral Daily PRN Arta Silence, MD      . buPROPion (WELLBUTRIN XL) 24 hr tablet 150 mg  150 mg Oral Daily Arta Silence, MD   150 mg at 03/11/18 1001  . calcium carbonate (TUMS - dosed in mg elemental calcium) chewable tablet 500 mg  500 mg Oral BID WC Arta Silence, MD   500 mg at 03/11/18 0953  . cholecalciferol (VITAMIN D) tablet 1,000 Units  1,000 Units Oral Daily Arta Silence, MD   1,000 Units at 03/11/18 0953  . citalopram (CELEXA) tablet 40 mg  40 mg Oral Daily Arta Silence, MD   40 mg at 03/11/18 0953  . clonazePAM (KLONOPIN) tablet 0.5 mg  0.5 mg Oral Daily Dora Simeone T, MD      . clopidogrel (PLAVIX) tablet 75 mg  75 mg Oral Daily Arta Silence, MD   75 mg at 03/11/18 0953  . enoxaparin (LOVENOX) injection 40 mg  40 mg Subcutaneous Q24H Arta Silence, MD   40 mg at 03/10/18 2017  . lacosamide (VIMPAT) tablet 200 mg  200 mg Oral BID Arta Silence, MD   200 mg at 03/11/18 0953  . ondansetron (ZOFRAN) tablet 4 mg  4 mg Oral Q6H PRN Arta Silence, MD       Or  . ondansetron (ZOFRAN) injection 4 mg  4 mg Intravenous Q6H PRN Jodell Cipro, Prasanna, MD      . risperiDONE (RISPERDAL) tablet 4 mg  4 mg Oral QHS Nakaya Mishkin T,  MD      . senna-docusate (Senokot-S) tablet 1 tablet  1 tablet Oral QHS PRN Arta Silence, MD      . traZODone (DESYREL) tablet 50 mg  50 mg Oral QHS Jenniferann Stuckert, Madie Reno, MD  Musculoskeletal: Strength & Muscle Tone: within normal limits Gait & Station: normal Patient leans: N/A  Psychiatric Specialty Exam: Physical Exam  Nursing note and vitals reviewed. Constitutional: He appears well-developed and well-nourished.  HENT:  Head: Normocephalic and atraumatic.  Eyes: Pupils are equal, round, and reactive to light. Conjunctivae are normal.  Neck: Normal range of motion.  Cardiovascular: Regular rhythm and normal heart sounds.  Respiratory: Effort normal. No respiratory distress.  GI: Soft.  Musculoskeletal: Normal range of motion.  Neurological: He is alert.  Skin: Skin is warm and dry.  Psychiatric: He has a normal mood and affect. His speech is delayed. He is slowed. Thought content is not paranoid. Cognition and memory are impaired. He expresses inappropriate judgment. He expresses no homicidal and no suicidal ideation.    Review of Systems  Constitutional: Negative.   HENT: Negative.   Eyes: Negative.   Respiratory: Negative.   Cardiovascular: Negative.   Gastrointestinal: Negative.   Musculoskeletal: Negative.   Skin: Negative.   Neurological: Negative.   Psychiatric/Behavioral: Negative.     Blood pressure 109/70, pulse 78, temperature 99 F (37.2 C), temperature source Oral, resp. rate 16, height 5\' 6"  (1.676 m), weight 87.1 kg (192 lb), SpO2 98 %.Body mass index is 30.99 kg/m.  General Appearance: Casual  Eye Contact:  Good  Speech:  Clear and Coherent  Volume:  Decreased  Mood:  Euthymic  Affect:  Congruent  Thought Process:  Coherent  Orientation:  Full (Time, Place, and Person)  Thought Content:  Logical  Suicidal Thoughts:  No  Homicidal Thoughts:  No  Memory:  Immediate;   Fair Recent;   Fair Remote;   Fair  Judgement:  Fair  Insight:  Fair   Psychomotor Activity:  Normal  Concentration:  Concentration: Fair  Recall:  [employer] of Knowledge:  Fair  Language:  Fair  Akathisia:  No  Handed:  Right  AIMS (if indicated):     Assets:  Desire for Improvement Housing  ADL's:  Intact  Cognition:  Impaired,  Mild  Sleep:        Treatment Plan Summary: Medication management and Plan Patient seems to have returned to baseline.  Still unclear with the altered mental status was but his medications that he had been taking had evidently been stable for quite some time and are unlikely to have contributed to what brought him into the hospital.  Question was raised today as to whether we should send him back home on his previous medicine.  I think altogether that is probably the best thing to do given especially that he will be in a setting where people who know him will be watching over him.  Therefore I have restarted the small dose of clonazepam, the risk Toradol at night and the trazodone at night in addition to the antidepressants he was taking.  Patient has no complaints.  It is my understanding that he is going to be discharged today and can follow-up with his regular caregivers in the community.  Disposition: No evidence of imminent risk to self or others at present.   Patient does not meet criteria for psychiatric inpatient admission. Supportive therapy provided about ongoing stressors.  Alethia Berthold, MD 03/11/2018 10:31 AM

## 2018-03-11 NOTE — Discharge Summary (Addendum)
[facility] [place] NAME: Mario Liu    MR#:  [id number]  DATE OF BIRTH:  10/06/1960  DATE OF ADMISSION:  03/08/2018 ADMITTING PHYSICIAN: Arta Silence, MD  DATE OF DISCHARGE: 03/11/2018  PRIMARY CARE PHYSICIAN: [name], FNP    ADMISSION DIAGNOSIS:  Seizure disorder ([place]) [G40.909] Altered mental status, unspecified altered mental status type [R41.82]  DISCHARGE DIAGNOSIS:  Principal Problem:   Altered mental status Active Problems:   Down's syndrome   Seizure disorder ([place])   SECONDARY DIAGNOSIS:   Past Medical History:  Diagnosis Date  . COPD (chronic obstructive pulmonary disease) ([place])   . Hepatitis C   . Hx of cholecystectomy   . Hypothyroidism   . Kidney cysts   . Major depressive disorder    Was treated at facility in [place] (D and G) years ago  . Stroke ([place])   . Tobacco abuse   . Vitamin D deficiency     HOSPITAL COURSE:   1.  Acute encephalopathy.  Patient up this morning for about an hour.  Able to answer questions appropriately move all extremities.  MRI of the brain is negative.  The patient walked well on the day of admission with physical therapy.  New information from the sister and guardian mentioned that he was up for 2 days at the group home so he was probably catching up on sleep here.  Case discussed with psychiatry Dr. Weber Cooks to see the patient and probably restart his psychiatric medications. 2.  Chronic thrombocytopenia and hepatitis C 3.  History of stroke on aspirin Plavix and atorvastatin 4.  Hyperlipidemia unspecified on atorvastatin 5.  History of seizures on Vimpat 6.  Down syndrome.  People with Down syndrome to develop early dementia so that could be part of the reason for his altered mental status.  Patient does not qualify for a higher level of care at this point.  DISCHARGE CONDITIONS:   Fair  CONSULTS OBTAINED:  Treatment Team:  Arta Silence, MD Clapacs, Madie Reno, MD  DRUG ALLERGIES:  No Known Allergies  DISCHARGE MEDICATIONS:   Allergies as of 03/11/2018   No Known Allergies     Medication List    STOP taking these medications   cefUROXime 250 MG tablet Commonly known as:  CEFTIN   lacosamide 200 MG Tabs tablet Commonly known as:  VIMPAT   potassium chloride 10 MEQ tablet Commonly known as:  K-DUR     TAKE these medications   albuterol 108 (90 Base) MCG/ACT inhaler Commonly known as:  PROVENTIL HFA;VENTOLIN HFA Inhale into the lungs every 6 (six) hours as needed for wheezing or shortness of breath.   alendronate 70 MG tablet Commonly known as:  FOSAMAX Take 70 mg by mouth once a week. Fridays   aspirin 325 MG tablet Take 325 mg daily by mouth.   atorvastatin 40 MG tablet Commonly known as:  LIPITOR Take 40 mg daily by mouth.   buPROPion 150 MG 24 hr tablet Commonly known as:  WELLBUTRIN XL Take 150 mg by mouth daily.   calcium carbonate 600 MG Tabs tablet Commonly known as:  OS-CAL Take 600 mg by mouth 2 (two) times daily with a meal.   Cholecalciferol 1000 units tablet Take 1 tablet (1,000 Units total) by mouth daily.   citalopram 40 MG tablet Commonly known as:  CELEXA Take 40 mg by mouth daily.   clonazePAM 0.5 MG tablet Commonly known as:  KLONOPIN Take 0.5 mg  by mouth daily.   clopidogrel 75 MG tablet Commonly known as:  PLAVIX Take 75 mg daily by mouth.   fluticasone 50 MCG/ACT nasal spray Commonly known as:  FLONASE Place 1 spray into both nostrils daily.   GERI-LANTA 200-200-20 MG/5ML suspension Generic drug:  alum & mag hydroxide-simeth Take 15 mLs by mouth every 6 (six) hours as needed for indigestion or heartburn.   loratadine 10 MG tablet Commonly known as:  CLARITIN Take 10 mg by mouth daily.   polyethylene glycol packet Commonly known as:  MIRALAX / GLYCOLAX Take 17 g by mouth daily as needed.   risperidone 4 MG tablet Commonly known as:  RISPERDAL Take 4 mg by mouth at  bedtime.   traZODone 50 MG tablet Commonly known as:  DESYREL Take 1 tablet (50 mg total) by mouth at bedtime.            Durable Medical Equipment  (From admission, onward)        Start     Ordered   03/11/18 1114  For home use only DME Walker rolling  Once    Question:  Patient needs a walker to treat with the following condition  Answer:  Unsteady gait   03/11/18 1114       DISCHARGE INSTRUCTIONS:   Follow-up PMD 6 days Follow-up his psychiatrist 1 to 2 weeks  If you experience worsening of your admission symptoms, develop shortness of breath, life threatening emergency, suicidal or homicidal thoughts you must seek medical attention immediately by calling 911 or calling your MD immediately  if symptoms less severe.  You Must read complete instructions/literature along with all the possible adverse reactions/side effects for all the Medicines you take and that have been prescribed to you. Take any new Medicines after you have completely understood and accept all the possible adverse reactions/side effects.   Please note  You were cared for by a hospitalist during your hospital stay. If you have any questions about your discharge medications or the care you received while you were in the hospital after you are discharged, you can call the unit and asked to speak with the hospitalist on call if the hospitalist that took care of you is not available. Once you are discharged, your primary care physician will handle any further medical issues. Please note that NO REFILLS for any discharge medications will be authorized once you are discharged, as it is imperative that you return to your primary care physician (or establish a relationship with a primary care physician if you do not have one) for your aftercare needs so that they can reassess your need for medications and monitor your lab values.    Today   CHIEF COMPLAINT:   Chief Complaint  Patient presents with  . Altered  Mental Status    HISTORY OF PRESENT ILLNESS:  Mario Liu  is a 57 y.o. male presents with altered mental status   VITAL SIGNS:  Blood pressure 109/70, pulse 78, temperature 99 F (37.2 C), temperature source Oral, resp. rate 16, height 5\' 6"  (1.676 m), weight 87.1 kg (192 lb), SpO2 98 %.   PHYSICAL EXAMINATION:  GENERAL:  57 y.o.-year-old patient lying in the bed with no acute distress.  EYES: Pupils equal, round, reactive to light and accommodation. No scleral icterus. HEENT: Head atraumatic, normocephalic. Oropharynx and nasopharynx clear.  NECK:  Supple, no jugular venous distention. No thyroid enlargement, no tenderness.  LUNGS: Normal breath sounds bilaterally, no wheezing, rales,rhonchi or crepitation. No use of  accessory muscles of respiration.  CARDIOVASCULAR: S1, S2 normal. No murmurs, rubs, or gallops.  ABDOMEN: Soft, non-tender, non-distended. Bowel sounds present. No organomegaly or mass.  EXTREMITIES: No pedal edema, cyanosis, or clubbing.  NEUROLOGIC: Cranial nerves II through XII are intact. Muscle strength 5/5 in all extremities. Sensation intact. Gait not checked.  PSYCHIATRIC: The patient is alert and  answers some simple questions SKIN: No obvious rash, lesion, or ulcer.   DATA REVIEW:   CBC Recent Labs  Lab 03/08/18 1259  WBC 4.8  HGB 14.8  HCT 45.7  PLT 96*    Chemistries  Recent Labs  Lab 03/08/18 1259  NA 138  K 4.0  CL 103  CO2 26  GLUCOSE 132*  BUN 17  CREATININE 1.10  CALCIUM 10.3  AST 28  ALT 26  ALKPHOS 53  BILITOT 0.7    Cardiac Enzymes Recent Labs  Lab 03/08/18 1259  TROPONINI <0.03    Microbiology Results  Results for orders placed or performed during the hospital encounter of 03/08/18  Urine Culture     Status: Abnormal   Collection Time: 03/08/18  6:27 PM  Result Value Ref Range Status   Specimen Description   Final    URINE, RANDOM Performed at [facility], [address] [place], [place] [address]     Special Requests   Final    NONE Performed at [facility], [address], [place], [place] [address]    Culture (A)  Final    <10,000 COLONIES/mL INSIGNIFICANT GROWTH Performed at [facility], [place] [address] [place], [place] [address]    Report Status 03/10/2018 FINAL  Final     Management plans discussed with the patient, family and they are in agreement.  CODE STATUS:     Code Status Orders  (From admission, onward)        Start     Ordered   03/09/18 0411  Full code  Continuous     03/09/18 0410    Code Status History    Date Active Date Inactive Code Status Order ID Comments User Context   03/01/2018 2228 03/03/2018 1724 Full Code [id number]  Dustin Flock, MD Inpatient   05/18/2017 1552 05/26/2017 1637 Full Code [id number]  Flora Lipps Inpatient   05/18/2017 1552 05/18/2017 1552 Full Code [id number]  Flora Lipps Inpatient   05/14/2017 1847 05/18/2017 1542 Full Code [id number]  Maryellen Pile, MD Inpatient      TOTAL TIME TAKING CARE OF THIS PATIENT: 35 minutes.    Loletha Grayer M.D on 03/11/2018 at 1:08 PM  Between 7am to 6pm - Pager - [phone]  After 6pm go to [email or web address] - password [email or web address]  [facility] Office  [phone]  CC: Primary care physician; [name], FNP

## 2018-03-11 NOTE — Plan of Care (Signed)
  Problem: Health Behavior/Discharge Planning: Goal: Ability to manage health-related needs will improve Outcome: Progressing   Problem: Clinical Measurements: Goal: Ability to maintain clinical measurements within normal limits will improve Outcome: Progressing Goal: Will remain free from infection Outcome: Progressing Goal: Diagnostic test results will improve Outcome: Progressing   Problem: Activity: Goal: Risk for activity intolerance will decrease Outcome: Progressing   Problem: Nutrition: Goal: Adequate nutrition will be maintained Outcome: Progressing   Problem: Coping: Goal: Level of anxiety will decrease Outcome: Progressing   Problem: Pain Managment: Goal: General experience of comfort will improve Outcome: Progressing   Problem: Safety: Goal: Ability to remain free from injury will improve Outcome: Progressing   Problem: Skin Integrity: Goal: Risk for impaired skin integrity will decrease Outcome: Progressing   

## 2018-03-11 NOTE — Progress Notes (Addendum)
  Speech Language Pathology Treatment: Dysphagia  Patient Details Name: Mario Liu MRN: [id number] DOB: 1961-01-13 Today's Date: 03/11/2018 Time: 1020-1040 SLP Time Calculation (min) (ACUTE ONLY): 20 min  Assessment / Plan / Recommendation Clinical Impression  Patient tolerated trials of graham cracker, teaspoons ice cream, and sips of water via straw with no s/s aspiration. Patient demonstrated clear vocal quality throughout all trials. Patient did require consistent cues to slow down, taking one bite or sip at a time before taking next sip or bite to minimize risk of aspiration. Nursing reports patient has a very good appetite and does well with meals, reporting no s/s aspiration observed. Patient to be discharged back to group home today. Recommend patient continue with current diet and be supervised for all meals to provide cues for pacing to minimize risk of aspiration.  HPI   Per admitting H&P: Mario Liu  is a 57 y.o. male with a known history of depression, CVA (w/ residual dysarthria), Down's syndrome who presents from group home w/ AMS. Relevant Hx/ROS unobtainable from pt, given his condition. Pt was reported to be somnolent/lethargic and unable to walk. I found Mario Liu sleeping comfortably with a blanket over his head. When I woke him, he sat upright in bed without any difficulty or need for assistance. He was able to tell me he is in the hospital and it is 2019. His speech is dysarthric and he is somewhat difficult to understand, but he responds appropriately and to the best of his abilities, and follows simple commands. He tells me he feels well, and wants to know when he can go home. He is comfortable, well-appearing and in no acute distress. He is afebrile and SIRS (-). His labwork is unimpressive. An MRI performed in the ED is (-) acute intracranial abnl. He had a similar presentation on 03/01/2018. He was diagnosed w/ UTI at that time. He has completed treatment. He does not have a  UTI on present admission.       SLP Plan  Discharge SLP treatment due to (comment) to d/c back to group home and met goal for tolerating current diet with no s/s aspiration.       Recommendations  Medication Administration: Whole meds with puree Compensations: Minimize environmental distractions;Slow rate;Small sips/bites;Follow solids with liquid Postural Changes and/or Swallow Maneuvers: Seated upright 90 degrees                Plan: Discharge SLP treatment due to (comment) patient tolerating diet with no s/s aspiration. No further treatment indicated at this time. Recommend supervision with meals (see above).       GO                Mario Liu 03/11/2018, 10:52 AM

## 2018-04-06 NOTE — Progress Notes (Signed)
04/07/2018 1:30 PM   Mario Liu 1961-09-12 [id number]  Referring provider: Remi Liu, [place], [place] [address]  Chief Complaint  Patient presents with  . Nephrolithiasis    HPI: Patient is a 57 year old [place] American male who is referred by Mario Liu, AGNP for nephrolithiasis from the [facility] with staff worker, [place].  He is not having any flank pain.  He is having frequency and incontinence.  He is having dysuria for Patient denies any gross hematuria, dysuria or suprapubic/flank pain.  Patient denies any fevers, chills, nausea or vomiting.   His UA is positive for many bacteria.    RUS performed at providers location noted a 2 cm stone in the right kidney associated with hydronephrosis.    Former smoker.  Quit 6 months ago.    PMH: Past Medical History:  Diagnosis Date  . COPD (chronic obstructive pulmonary disease) ([place])   . Hepatitis C   . Hx of cholecystectomy   . Hypothyroidism   . Kidney cysts   . Major depressive disorder    Was treated at facility in [place] ([facility]) years ago  . Stroke ([place])   . Tobacco abuse   . Vitamin D deficiency     Surgical History: Past Surgical History:  Procedure Laterality Date  . LAPAROSCOPIC CHOLECYSTECTOMY      Home Medications:  Allergies as of 04/07/2018   No Known Allergies     Medication List        Accurate as of 04/07/18  1:30 PM. Always use your most recent med list.          albuterol 108 (90 Base) MCG/ACT inhaler Commonly known as:  PROVENTIL HFA;VENTOLIN HFA Inhale into the lungs every 6 (six) hours as needed for wheezing or shortness of breath.   alendronate 70 MG tablet Commonly known as:  FOSAMAX Take 70 mg by mouth once a week. Fridays   aspirin 325 MG tablet Take 325 mg daily by mouth.   atorvastatin 40 MG tablet Commonly known as:  LIPITOR Take 40 mg daily by mouth.   buPROPion 150 MG 24 hr tablet Commonly known as:  WELLBUTRIN  XL Take 150 mg by mouth daily.   calcium carbonate 600 MG Tabs tablet Commonly known as:  OS-CAL Take 600 mg by mouth 2 (two) times daily with a meal.   Cholecalciferol 1000 units tablet Take 1 tablet (1,000 Units total) by mouth daily.   citalopram 40 MG tablet Commonly known as:  CELEXA Take 40 mg by mouth daily.   clonazePAM 0.5 MG tablet Commonly known as:  KLONOPIN Take 0.5 mg by mouth daily.   clopidogrel 75 MG tablet Commonly known as:  PLAVIX Take 75 mg daily by mouth.   fluticasone 50 MCG/ACT nasal spray Commonly known as:  FLONASE Place 1 spray into both nostrils daily.   GERI-LANTA 200-200-20 MG/5ML suspension Generic drug:  alum & mag hydroxide-simeth Take 15 mLs by mouth every 6 (six) hours as needed for indigestion or heartburn.   loratadine 10 MG tablet Commonly known as:  CLARITIN Take 10 mg by mouth daily.   polyethylene glycol packet Commonly known as:  MIRALAX / GLYCOLAX Take 17 g by mouth daily as needed.   risperidone 4 MG tablet Commonly known as:  RISPERDAL Take 4 mg by mouth at bedtime.   traZODone 50 MG tablet Commonly known as:  DESYREL Take 1 tablet (50 mg total) by mouth at  bedtime.       Allergies: No Known Allergies  Family History: Family History  Adopted: Yes  Problem Relation Age of Onset  . Diabetes Maternal Grandmother   . Prostate cancer Neg Hx   . Bladder Cancer Neg Hx   . Kidney cancer Neg Hx     Social History:  reports that he has been smoking cigarettes.  He has a 19.50 pack-year smoking history. He has never used smokeless tobacco. He reports that he does not drink alcohol or use drugs.  ROS: UROLOGY Frequent Urination?: Yes Hard to postpone urination?: No Burning/pain with urination?: No Get up at night to urinate?: No Leakage of urine?: No Urine stream starts and stops?: No Trouble starting stream?: No Do you have to strain to urinate?: No Blood in urine?: No Urinary tract infection?: Yes Sexually  transmitted disease?: No Injury to kidneys or bladder?: No Painful intercourse?: No Weak stream?: No Erection problems?: No Penile pain?: No  Gastrointestinal Nausea?: No Vomiting?: No Indigestion/heartburn?: No Diarrhea?: No Constipation?: No  Constitutional Fever: No Night sweats?: No Weight loss?: No Fatigue?: No  Skin Skin rash/lesions?: No Itching?: No  Eyes Blurred vision?: No Double vision?: No  Ears/Nose/Throat Sore throat?: No Sinus problems?: No  Hematologic/Lymphatic Swollen glands?: No Easy bruising?: No  Cardiovascular Leg swelling?: No Chest pain?: No  Respiratory Cough?: No Shortness of breath?: No  Endocrine Excessive thirst?: No  Musculoskeletal Back pain?: No Joint pain?: No  Neurological Headaches?: No Dizziness?: No  Psychologic Depression?: No Anxiety?: No  Physical Exam: BP 108/79 (BP Location: Left Arm, Patient Position: Sitting, Cuff Size: Normal)   Pulse 63   Ht 5\' 6"  (1.676 m)   BMI 30.99 kg/m   Constitutional:  Well nourished. Alert and oriented, No acute distress. HEENT: [place] AT, moist mucus membranes.  Trachea midline, no masses. Cardiovascular: No clubbing, cyanosis, or edema. Respiratory: Normal respiratory effort, no increased work of breathing. GI: Abdomen is soft, non tender, non distended, no abdominal masses. Liver and spleen not palpable.  No hernias appreciated.  Stool sample for occult testing is not indicated.   GU: No CVA tenderness.  No bladder fullness or masses.  Patient with uncircumcised phallus.  Foreskin easily retracted Urethral meatus is patent.  No penile discharge. No penile lesions or rashes. Scrotum without lesions, cysts, rashes and/or edema.  Testicles are located scrotally bilaterally. No masses are appreciated in the testicles. Left and right epididymis are normal. Rectal: Patient with  normal sphincter tone. Anus and perineum without scarring or rashes. No rectal masses are appreciated.  Prostate is approximately 50 grams, could only palpate the apex and midportion due to buttocks, no nodules are appreciated.  Skin: No rashes, bruises or suspicious lesions. Lymph: No cervical or inguinal adenopathy. Neurologic: Grossly intact, no focal deficits, moving all 4 extremities. Psychiatric: Normal mood and affect.  Laboratory Data: Lab Results  Component Value Date   WBC 4.8 03/08/2018   HGB 14.8 03/08/2018   HCT 45.7 03/08/2018   MCV 86.6 03/08/2018   PLT 96 (L) 03/08/2018    Lab Results  Component Value Date   CREATININE 1.10 03/08/2018    No results found for: PSA  No results found for: TESTOSTERONE  Lab Results  Component Value Date   HGBA1C 5.9 (H) 05/15/2017    No results found for: TSH     Component Value Date/Time   CHOL 138 05/15/2017 0433   HDL 33 (L) 05/15/2017 0433   CHOLHDL 4.2 05/15/2017 0433   VLDL  25 05/15/2017 0433   LDLCALC 80 05/15/2017 0433    Lab Results  Component Value Date   AST 28 03/08/2018   Lab Results  Component Value Date   ALT 26 03/08/2018   No components found for: ALKALINEPHOPHATASE No components found for: BILIRUBINTOTAL  No results found for: ESTRADIOL  Urinalysis See HPI and Epic.   I have reviewed the labs.   Assessment & Plan:    1. Right renal stone Will need a CT Renal stone study to evaluate for ureteral stones, exact location and size of the right renal stone so that the best treatment plan can be made to treat his stones Advised to contact our office or seek treatment in the ED if becomes febrile or pain/ vomiting are difficult control in order to arrange for emergent/urgent intervention RTC for report   2. Right hydronephrosis See above  3. BPH with LUTS Continue conservative management, avoiding bladder irritants and timed voiding's PSA drawn today RTC pending PSA      Return for CT report .  These notes generated with voice recognition software. I apologize for typographical  errors.  Zara Council, PA-C  [facility] [address]  [place], [place] [address] [phone]

## 2018-04-07 ENCOUNTER — Encounter: Payer: Self-pay | Admitting: Urology

## 2018-04-07 ENCOUNTER — Ambulatory Visit (INDEPENDENT_AMBULATORY_CARE_PROVIDER_SITE_OTHER): Payer: Medicare Other | Admitting: Urology

## 2018-04-07 VITALS — BP 108/79 | HR 63 | Ht 66.0 in

## 2018-04-07 DIAGNOSIS — N401 Enlarged prostate with lower urinary tract symptoms: Secondary | ICD-10-CM

## 2018-04-07 DIAGNOSIS — N132 Hydronephrosis with renal and ureteral calculous obstruction: Secondary | ICD-10-CM | POA: Diagnosis not present

## 2018-04-07 DIAGNOSIS — I639 Cerebral infarction, unspecified: Secondary | ICD-10-CM

## 2018-04-07 DIAGNOSIS — N200 Calculus of kidney: Secondary | ICD-10-CM | POA: Diagnosis not present

## 2018-04-07 DIAGNOSIS — N138 Other obstructive and reflux uropathy: Secondary | ICD-10-CM | POA: Diagnosis not present

## 2018-04-07 LAB — URINALYSIS, COMPLETE
Bilirubin, UA: NEGATIVE
GLUCOSE, UA: NEGATIVE
Ketones, UA: NEGATIVE
Nitrite, UA: NEGATIVE
PH UA: 6.5 (ref 5.0–7.5)
PROTEIN UA: NEGATIVE
RBC, UA: NEGATIVE
Specific Gravity, UA: >1.03 — ABNORMAL HIGH (ref 1.005–1.030)
Urobilinogen, Ur: 0.2 mg/dL (ref 0.2–1.0)

## 2018-04-07 LAB — MICROSCOPIC EXAMINATION: RBC, UA: NONE SEEN /hpf (ref 0–2)

## 2018-04-08 LAB — CBC WITH DIFFERENTIAL/PLATELET
BASOS ABS: 0 10*3/uL (ref 0.0–0.2)
Basos: 1 %
EOS (ABSOLUTE): 0.4 10*3/uL (ref 0.0–0.4)
Eos: 7 %
HEMOGLOBIN: 13.6 g/dL (ref 13.0–17.7)
Hematocrit: 42.6 % (ref 37.5–51.0)
Immature Grans (Abs): 0 10*3/uL (ref 0.0–0.1)
Immature Granulocytes: 0 %
LYMPHS ABS: 2.3 10*3/uL (ref 0.7–3.1)
Lymphs: 48 %
MCH: 27.4 pg (ref 26.6–33.0)
MCHC: 31.9 g/dL (ref 31.5–35.7)
MCV: 86 fL (ref 79–97)
MONOCYTES: 9 %
Monocytes Absolute: 0.4 10*3/uL (ref 0.1–0.9)
Neutrophils Absolute: 1.7 10*3/uL (ref 1.4–7.0)
Neutrophils: 35 %
PLATELETS: 121 10*3/uL — AB (ref 150–450)
RBC: 4.96 x10E6/uL (ref 4.14–5.80)
RDW: 12.2 % — AB (ref 12.3–15.4)
WBC: 4.8 10*3/uL (ref 3.4–10.8)

## 2018-04-08 LAB — BASIC METABOLIC PANEL
BUN / CREAT RATIO: 10 (ref 9–20)
BUN: 12 mg/dL (ref 6–24)
CALCIUM: 9.9 mg/dL (ref 8.7–10.2)
CHLORIDE: 100 mmol/L (ref 96–106)
CO2: 25 mmol/L (ref 20–29)
Creatinine, Ser: 1.21 mg/dL (ref 0.76–1.27)
GFR calc Af Amer: 78 mL/min/{1.73_m2} (ref >59)
GFR calc non Af Amer: 67 mL/min/{1.73_m2} (ref >59)
GLUCOSE: 122 mg/dL — AB (ref 65–99)
Potassium: 3.9 mmol/L (ref 3.5–5.2)
Sodium: 140 mmol/L (ref 134–144)

## 2018-04-08 LAB — PSA: PROSTATE SPECIFIC AG, SERUM: 0.6 ng/mL (ref 0.0–4.0)

## 2018-04-10 LAB — CULTURE, URINE COMPREHENSIVE

## 2018-04-12 ENCOUNTER — Telehealth: Payer: Self-pay

## 2018-04-12 MED ORDER — SULFAMETHOXAZOLE-TRIMETHOPRIM 800-160 MG PO TABS: 1.0000 | ORAL_TABLET | Freq: Two times a day (BID) | ORAL | 0 refills | Status: AC

## 2018-04-12 NOTE — Telephone Encounter (Signed)
Pt caretaker informed

## 2018-04-12 NOTE — Telephone Encounter (Signed)
-----   Message from Nori Riis, PA-C sent at 04/10/2018  3:11 PM EDT ----- Please let Mr. Sinning care taker that he has a positive urine culture and needs to start Septra DS, one twice daily for seven days.

## 2018-04-19 ENCOUNTER — Ambulatory Visit
Admission: RE | Admit: 2018-04-19 | Discharge: 2018-04-19 | Disposition: A | Discharge status: Home / Self Care | Payer: Medicare Other | Source: Ambulatory Visit | Attending: Urology | Admitting: Urology

## 2018-04-19 DIAGNOSIS — M5136 Other intervertebral disc degeneration, lumbar region: Secondary | ICD-10-CM | POA: Diagnosis not present

## 2018-04-19 DIAGNOSIS — M47816 Spondylosis without myelopathy or radiculopathy, lumbar region: Secondary | ICD-10-CM | POA: Insufficient documentation

## 2018-04-19 DIAGNOSIS — N281 Cyst of kidney, acquired: Secondary | ICD-10-CM | POA: Diagnosis not present

## 2018-04-19 DIAGNOSIS — N132 Hydronephrosis with renal and ureteral calculous obstruction: Secondary | ICD-10-CM | POA: Diagnosis present

## 2018-04-20 NOTE — Progress Notes (Signed)
04/21/2018 10:35 AM   Mario Liu November 07, 1960 [id number]  Referring provider: [name], [place], [place] [address]  Chief Complaint  Patient presents with  . Follow-up    CT results    HPI: Patient is a 57 year old African American male who presents today for his CT Renal stone study.  Background history Patient is a 57 year old [place] American male who is referred by Mario Liu, AGNP for nephrolithiasis from the [facility] center with staff worker, [place].  He is not having any flank pain.  He is having frequency and incontinence.  He is having dysuria for Patient denies any gross hematuria, dysuria or suprapubic/flank pain.  Patient denies any fevers, chills, nausea or vomiting.  His UA is positive for many bacteria.  RUS performed at providers location noted a 2 cm stone in the right kidney associated with hydronephrosis.  Former smoker.  Quit 6 months ago.    CT Renal stone study on 04/19/2018 noted enlargement of the primarily hypodense lesion of the right kidney lower pole with curvilinear calcifications along its margins. This had a cystic appearance on ultrasound of 04/23/2017 but is currently larger than at that time. The dense marginal calcification indicates complexity. Given the degree of motion artifact as well as the lack of IV contrast, exclusion of cystic malignancy is problematic. Given the degree of motion artifact on CT, MRI is likely impractical.  Surveillance by ultrasound is likely warranted to assess for any visible soft tissue components.  Fluid density cyst of the right mid kidney noted.  No appreciable definite stones separate from the marginal calcification of the cystic lesion of the right kidney lower pole.  Prominent stool throughout the colon favors constipation.  Lumbar spondylosis and degenerative disc disease.  His caregiver states that she has noticed that his urine has been dark.  She is also stating that he is  still having times of confusion.  Patient denies any gross hematuria, dysuria or suprapubic/flank pain.  Patient denies any fevers, chills, nausea or vomiting.    PMH: Past Medical History:  Diagnosis Date  . COPD (chronic obstructive pulmonary disease) ([place])   . Hepatitis C   . Hx of cholecystectomy   . Hypothyroidism   . Kidney cysts   . Major depressive disorder    Was treated at facility in [place] ([facility]) years ago  . Stroke ([place])   . Tobacco abuse   . Vitamin D deficiency     Surgical History: Past Surgical History:  Procedure Laterality Date  . LAPAROSCOPIC CHOLECYSTECTOMY      Home Medications:  Allergies as of 04/21/2018   No Known Allergies     Medication List        Accurate as of 04/21/18 10:35 AM. Always use your most recent med list.          albuterol 108 (90 Base) MCG/ACT inhaler Commonly known as:  PROVENTIL HFA;VENTOLIN HFA Inhale into the lungs every 6 (six) hours as needed for wheezing or shortness of breath.   alendronate 70 MG tablet Commonly known as:  FOSAMAX Take 70 mg by mouth once a week. Fridays   aspirin 325 MG tablet Take 325 mg daily by mouth.   atorvastatin 40 MG tablet Commonly known as:  LIPITOR Take 40 mg daily by mouth.   buPROPion 150 MG 24 hr tablet Commonly known as:  WELLBUTRIN XL Take 150 mg by mouth daily.   calcium carbonate  600 MG Tabs tablet Commonly known as:  OS-CAL Take 600 mg by mouth 2 (two) times daily with a meal.   Cholecalciferol 1000 units tablet Take 1 tablet (1,000 Units total) by mouth daily.   citalopram 40 MG tablet Commonly known as:  CELEXA Take 40 mg by mouth daily.   clonazePAM 0.5 MG tablet Commonly known as:  KLONOPIN Take 0.5 mg by mouth daily.   clopidogrel 75 MG tablet Commonly known as:  PLAVIX Take 75 mg daily by mouth.   fluticasone 50 MCG/ACT nasal spray Commonly known as:  FLONASE Place 1 spray into both nostrils daily.   GERI-LANTA 200-200-20 MG/5ML  suspension Generic drug:  alum & mag hydroxide-simeth Take 15 mLs by mouth every 6 (six) hours as needed for indigestion or heartburn.   loratadine 10 MG tablet Commonly known as:  CLARITIN Take 10 mg by mouth daily.   polyethylene glycol packet Commonly known as:  MIRALAX / GLYCOLAX Take 17 g by mouth daily as needed.   risperidone 4 MG tablet Commonly known as:  RISPERDAL Take 4 mg by mouth at bedtime.   traZODone 50 MG tablet Commonly known as:  DESYREL Take 1 tablet (50 mg total) by mouth at bedtime.       Allergies: No Known Allergies  Family History: Family History  Adopted: Yes  Problem Relation Age of Onset  . Diabetes Maternal Grandmother   . Prostate cancer Neg Hx   . Bladder Cancer Neg Hx   . Kidney cancer Neg Hx     Social History:  reports that he has been smoking cigarettes.  He has a 19.50 pack-year smoking history. He has never used smokeless tobacco. He reports that he does not drink alcohol or use drugs.  ROS: UROLOGY Frequent Urination?: No Hard to postpone urination?: No Burning/pain with urination?: No Get up at night to urinate?: No Leakage of urine?: No Urine stream starts and stops?: No Trouble starting stream?: No Do you have to strain to urinate?: No Blood in urine?: No Urinary tract infection?: No Sexually transmitted disease?: No Injury to kidneys or bladder?: No Painful intercourse?: No Weak stream?: No Erection problems?: No Penile pain?: No  Gastrointestinal Nausea?: No Vomiting?: No Indigestion/heartburn?: No Diarrhea?: No Constipation?: No  Constitutional Fever: No Night sweats?: No Weight loss?: No Fatigue?: No  Skin Skin rash/lesions?: No Itching?: No  Eyes Blurred vision?: Yes Double vision?: No  Ears/Nose/Throat Sore throat?: No Sinus problems?: No  Hematologic/Lymphatic Swollen glands?: No Easy bruising?: No  Cardiovascular Leg swelling?: No Chest pain?: No  Respiratory Cough?:  No Shortness of breath?: No  Endocrine Excessive thirst?: No  Musculoskeletal Back pain?: No Joint pain?: No  Neurological Headaches?: No Dizziness?: No  Psychologic Depression?: No Anxiety?: No  Physical Exam: BP 101/79 (BP Location: Left Arm, Patient Position: Sitting, Cuff Size: Normal)   Pulse 87   Ht 5\' 6"  (1.676 m)   BMI 30.99 kg/m   Constitutional: Well nourished. Alert and oriented, No acute distress. HEENT: [place] AT, moist mucus membranes. Trachea midline, no masses. Cardiovascular: No clubbing, cyanosis, or edema. Respiratory: Normal respiratory effort, no increased work of breathing. Skin: No rashes, bruises or suspicious lesions. Lymph: No cervical or inguinal adenopathy. Neurologic: Grossly intact, no focal deficits, moving all 4 extremities. Psychiatric: Normal mood and affect.  Laboratory Data: PSA Trend  0.6 in 03/2018  Lab Results  Component Value Date   WBC 4.8 04/07/2018   HGB 13.6 04/07/2018   HCT 42.6 04/07/2018   MCV  86 04/07/2018   PLT 121 (L) 04/07/2018    Lab Results  Component Value Date   CREATININE 1.21 04/07/2018    No results found for: PSA  No results found for: TESTOSTERONE  Lab Results  Component Value Date   HGBA1C 5.9 (H) 05/15/2017    No results found for: TSH     Component Value Date/Time   CHOL 138 05/15/2017 0433   HDL 33 (L) 05/15/2017 0433   CHOLHDL 4.2 05/15/2017 0433   VLDL 25 05/15/2017 0433   LDLCALC 80 05/15/2017 0433    Lab Results  Component Value Date   AST 28 03/08/2018   Lab Results  Component Value Date   ALT 26 03/08/2018   No components found for: ALKALINEPHOPHATASE No components found for: BILIRUBINTOTAL  No results found for: ESTRADIOL  I have reviewed the labs.  Pertinent Imaging CLINICAL DATA:  Nephrolithiasis. Urinary frequency and incontinence. Bacteriuria.  EXAM: CT ABDOMEN AND PELVIS WITHOUT CONTRAST  TECHNIQUE: Multidetector CT imaging of the abdomen and pelvis  was performed following the standard protocol without IV contrast.  COMPARISON:  Abdominal ultrasound 04/23/2017  FINDINGS: Despite efforts by the technologist and patient, motion artifact is present on today's exam and could not be eliminated. This reduces exam sensitivity and specificity.  Lower chest: Right paraesophageal lymph node 0.8 cm in short axis on image 9/2.  Hepatobiliary: Cholecystectomy.  Otherwise unremarkable.  Pancreas: Unremarkable  Spleen: Unremarkable  Adrenals/Urinary Tract: Adrenal glands unremarkable. Kidneys partially blurred by motion artifact. 5.5 by 4.6 by 4.3 cm hypodense lesion of the right kidney lower pole with a curvilinear calcification measuring 2 cm in length and about 0.5 cm in thickness along the anterior inferior margin, and some faint additional calcifications posteriorly. The overall lesion measured 4.4 by 2.3 by 2.8 cm on the ultrasound from 04/23/2017.  Lobular margin of the right kidney upper pole on image 26/2. Right lateral mid kidney hypodense 2.8 by 2.8 cm lesion with fluid density internally, probably a cyst.  No compelling findings of duplicated collecting system. Urinary bladder unremarkable.  Stomach/Bowel: Prominent stool throughout the colon favors constipation. Appendix not well seen.  Vascular/Lymphatic: Unremarkable  Reproductive: Unremarkable  Other: No supplemental non-categorized findings.  Musculoskeletal: Lumbar spondylosis and degenerative disc disease.  IMPRESSION: 1. Enlargement of the primarily hypodense lesion of the right kidney lower pole with curvilinear calcifications along its margins. This had a cystic appearance on ultrasound of 04/23/2017 but is currently larger than at that time. The dense marginal calcification indicates complexity. Given the degree of motion artifact as well as the lack of IV contrast, exclusion of cystic malignancy is problematic. Given the degree of  motion artifact on CT, MRI is likely impractical. Surveillance by ultrasound is likely warranted to assess for any visible soft tissue components. 2. Fluid density cyst of the right mid kidney noted. 3. No appreciable definite stones separate from the marginal calcification of the cystic lesion of the right kidney lower pole. 4.  Prominent stool throughout the colon favors constipation. 5. Lumbar spondylosis and degenerative disc disease.   Electronically Signed   By: Van Clines M.D.   On: 04/19/2018 15:04    I have independently reviewed the films  Assessment & Plan:    1. Right renal complex cyst Explained to the patient that the "stone" is actually the margin of a complex renal cyst which needs to be examined further Due to motion artifact it was difficult to evaluate for malignancy Radiologist recommends ultrasound for further evaluation We  will obtain renal ultrasound and patient will return for report  2. BPH with LUTS Continue conservative management, avoiding bladder irritants and timed voiding's Patient to return on annual basis for IPSS score, exam and PSA.  Return for RUS report .  These notes generated with voice recognition software. I apologize for typographical errors.  Zara Council, PA-C  [facility] [address]  [place], [place] [address] [phone]

## 2018-04-21 ENCOUNTER — Telehealth: Payer: Self-pay | Admitting: Urology

## 2018-04-21 ENCOUNTER — Encounter: Payer: Self-pay | Admitting: Urology

## 2018-04-21 ENCOUNTER — Ambulatory Visit (INDEPENDENT_AMBULATORY_CARE_PROVIDER_SITE_OTHER): Payer: Medicare Other | Admitting: Urology

## 2018-04-21 VITALS — BP 101/79 | HR 87 | Ht 66.0 in

## 2018-04-21 DIAGNOSIS — N138 Other obstructive and reflux uropathy: Secondary | ICD-10-CM

## 2018-04-21 DIAGNOSIS — N281 Cyst of kidney, acquired: Secondary | ICD-10-CM

## 2018-04-21 DIAGNOSIS — I639 Cerebral infarction, unspecified: Secondary | ICD-10-CM

## 2018-04-21 DIAGNOSIS — N401 Enlarged prostate with lower urinary tract symptoms: Secondary | ICD-10-CM

## 2018-04-21 NOTE — Telephone Encounter (Signed)
Pt's legal guardian, [name], would like a call to talk about patient's care.  Please call her at [phone].

## 2018-04-21 NOTE — Telephone Encounter (Signed)
Spoke with Mario Liu regarding the CT scan results and the need to have a RUS to have another look at the complex cyst in his kidney.  We also need the caregiver to bring another urine specimen to look for blood in the urine.

## 2018-05-04 ENCOUNTER — Inpatient Hospital Stay: Payer: Medicare Other | Attending: Oncology

## 2018-05-04 ENCOUNTER — Other Ambulatory Visit: Payer: Self-pay

## 2018-05-04 ENCOUNTER — Inpatient Hospital Stay (HOSPITAL_BASED_OUTPATIENT_CLINIC_OR_DEPARTMENT_OTHER): Payer: Medicare Other | Admitting: Oncology

## 2018-05-04 ENCOUNTER — Encounter: Payer: Self-pay | Admitting: Oncology

## 2018-05-04 VITALS — BP 103/77 | HR 72 | Temp 96.4°F | Resp 18 | Wt 178.6 lb

## 2018-05-04 DIAGNOSIS — F17210 Nicotine dependence, cigarettes, uncomplicated: Secondary | ICD-10-CM | POA: Insufficient documentation

## 2018-05-04 DIAGNOSIS — Z79899 Other long term (current) drug therapy: Secondary | ICD-10-CM | POA: Diagnosis not present

## 2018-05-04 DIAGNOSIS — E039 Hypothyroidism, unspecified: Secondary | ICD-10-CM | POA: Diagnosis not present

## 2018-05-04 DIAGNOSIS — R768 Other specified abnormal immunological findings in serum: Secondary | ICD-10-CM | POA: Diagnosis not present

## 2018-05-04 DIAGNOSIS — Z7902 Long term (current) use of antithrombotics/antiplatelets: Secondary | ICD-10-CM | POA: Diagnosis not present

## 2018-05-04 DIAGNOSIS — Z8673 Personal history of transient ischemic attack (TIA), and cerebral infarction without residual deficits: Secondary | ICD-10-CM

## 2018-05-04 DIAGNOSIS — Z7982 Long term (current) use of aspirin: Secondary | ICD-10-CM

## 2018-05-04 DIAGNOSIS — Q909 Down syndrome, unspecified: Secondary | ICD-10-CM

## 2018-05-04 DIAGNOSIS — F329 Major depressive disorder, single episode, unspecified: Secondary | ICD-10-CM | POA: Insufficient documentation

## 2018-05-04 DIAGNOSIS — J449 Chronic obstructive pulmonary disease, unspecified: Secondary | ICD-10-CM | POA: Insufficient documentation

## 2018-05-04 DIAGNOSIS — D696 Thrombocytopenia, unspecified: Secondary | ICD-10-CM

## 2018-05-04 LAB — CBC WITH DIFFERENTIAL/PLATELET
BASOS ABS: 0 10*3/uL (ref 0–0.1)
BASOS PCT: 0 %
EOS PCT: 7 %
Eosinophils Absolute: 0.3 10*3/uL (ref 0–0.7)
HEMATOCRIT: 40.9 % (ref 40.0–52.0)
Hemoglobin: 13.2 g/dL (ref 13.0–18.0)
Lymphocytes Relative: 46 %
Lymphs Abs: 1.9 10*3/uL (ref 1.0–3.6)
MCH: 28.5 pg (ref 26.0–34.0)
MCHC: 32.3 g/dL (ref 32.0–36.0)
MCV: 88.2 fL (ref 80.0–100.0)
MONO ABS: 0.5 10*3/uL (ref 0.2–1.0)
Monocytes Relative: 11 %
NEUTROS ABS: 1.5 10*3/uL (ref 1.4–6.5)
Neutrophils Relative %: 36 %
PLATELETS: 108 10*3/uL — AB (ref 150–440)
RBC: 4.63 MIL/uL (ref 4.40–5.90)
RDW: 13.4 % (ref 11.5–14.5)
WBC: 4.2 10*3/uL (ref 3.8–10.6)

## 2018-05-04 NOTE — Progress Notes (Signed)
Patient rescheduled as he had not had his renal ultrasound.

## 2018-05-04 NOTE — Progress Notes (Signed)
Patient here for follow up. Caregiver present.

## 2018-05-04 NOTE — Progress Notes (Signed)
[facility] follow up Visit:  Patient Care Team: [name], FNP as PCP - General (Family Medicine)  CHIEF COMPLAINTS/PURPOSE OF CONSULTATION: Low platelets  HISTORY OF PRESENTING ILLNESS: Mario Liu 57 y.o. male is here for evaluation of low platelet count. He has Down syndrome.  The patient lives in a group home and is accompanied by caregiver from group home. He is referred to [place] for evaluation of thrombocytopenia. He had labs on with primary care provider on December 15 2016 which showed WBC 5.3 hemoglobin 14.3 MCV 88 RDW 13.8 MPV 11.37 platelet 111 lymphocyte 46.69% absolute lymphocytes 2.5 normal is BMPnormal LFT. It was noted that hepatitis C antibody reactive. Patient was seen by [facility] for workupof her positive hepatitis C antibody Further tests including HCV RNA by PCR was negative.  Hep B core antibody negative, Hb surface antibody nonreactive,A antibody positive,Hep A IgM negative. So that patient did not really have hepatitis C, additional ultrasound and fibrocystic was canceled by GI.Marland Kitchen Patient reports feeling well, good energy level, good appetite, denies any weight loss fatigue, fever or chills. Patient is a poor historian. He told me that he used to drink liquor but haven't drink lately. In September 2018, patient had a stroke, acute ischemic left MCA stroke. His in getting physical therapy. Denies any easy bruising or bleeding. He appears to be less duct if compared to prior to the stroke. Able to have simple conversation with me.Denies any recent infection, bleeding, surgery. He has some dysphagia and speech intelligibility which slowly improving with the physical therapy and speech therapy according to the group home staff.  INTERVAL HISTORY Patient presents to clinic for follow-up of thrombocytopenia.  Accompanied by staff in group home. Per staff patient recently had urinary symptoms including increasing frequency and incontinence.  UA was  positive for many bacteria.  Renal ultrasound performed and provide dislocation noted kidney stone in the right kidney associated with hydronephrosis. 04/19/2018 CT renal stone study.  Enlargement of primary hypodense lesion of the right kidney lower pole.  Fluid density cyst of right mid kidney.  No appreciable definitive stones Patient was seen and evaluated by urology [facility]: [place].  Patient is going to have a renal ultrasound to have another look at the complex cyst in his kidney.  Per group home staff will come the patient today, patient has good appetite, no reports of fever or chills.  Feels well.  No bleeding events.  Review of Systems  Unable to perform ROS: Other (down syndrome)  Constitutional: Negative for appetite change, diaphoresis, fatigue and unexpected weight change.  HENT:   Negative for hearing loss and lump/mass.   Eyes: Negative for eye problems and icterus.  Respiratory: Negative for chest tightness and cough.   Cardiovascular: Negative for chest pain.  Gastrointestinal: Negative for abdominal distention and diarrhea.  Endocrine: Negative for hot flashes.  Genitourinary: Positive for bladder incontinence and frequency. Negative for difficulty urinating, dysuria, hematuria and nocturia.   Musculoskeletal: Negative for arthralgias, back pain and myalgias.  Skin: Negative for itching and rash.  Hematological: Does not bruise/bleed easily.    MEDICAL HISTORY: Past Medical History:  Diagnosis Date  . COPD (chronic obstructive pulmonary disease) ([place])   . Hepatitis C   . Hx of cholecystectomy   . Hypothyroidism   . Kidney cysts   . Major depressive disorder    Was treated at facility in [place] ([facility]) years ago  . Stroke ([place])   . Tobacco  abuse   . Vitamin D deficiency     SURGICAL HISTORY: Past Surgical History:  Procedure Laterality Date  . LAPAROSCOPIC CHOLECYSTECTOMY      SOCIAL HISTORY: Social History   Socioeconomic History  . Marital status:  Single    Spouse name: Not on file  . Number of children: Not on file  . Years of education: Not on file  . Highest education level: Not on file  Occupational History  . Not on file  Social Needs  . Financial resource strain: Not on file  . Food insecurity:    Worry: Not on file    Inability: Not on file  . Transportation needs:    Medical: Not on file    Non-medical: Not on file  Tobacco Use  . Smoking status: Current Every Day Smoker    Packs/day: 0.50    Years: 39.00    Pack years: 19.50    Types: Cigarettes  . Smokeless tobacco: Never Used  . Tobacco comment: pt is still smoking  Substance and Sexual Activity  . Alcohol use: No  . Drug use: No  . Sexual activity: Not on file  Lifestyle  . Physical activity:    Days per week: Not on file    Minutes per session: Not on file  . Stress: Not on file  Relationships  . Social connections:    Talks on phone: Not on file    Gets together: Not on file    Attends religious service: Not on file    Active member of club or organization: Not on file    Attends meetings of clubs or organizations: Not on file    Relationship status: Not on file  . Intimate partner violence:    Fear of current or ex partner: Not on file    Emotionally abused: Not on file    Physically abused: Not on file    Forced sexual activity: Not on file  Other Topics Concern  . Not on file  Social History Narrative  . Not on file    FAMILY HISTORY Family History  Adopted: Yes  Problem Relation Age of Onset  . Diabetes Maternal Grandmother   . Prostate cancer Neg Hx   . Bladder Cancer Neg Hx   . Kidney cancer Neg Hx     ALLERGIES:  has No Known Allergies.  MEDICATIONS:  Current Outpatient Medications  Medication Sig Dispense Refill  . albuterol (PROVENTIL HFA;VENTOLIN HFA) 108 (90 Base) MCG/ACT inhaler Inhale into the lungs every 6 (six) hours as needed for wheezing or shortness of breath.    Marland Kitchen alendronate (FOSAMAX) 70 MG tablet Take 70 mg  by mouth once a week. Fridays    . alum & mag hydroxide-simeth (GERI-LANTA) 200-200-20 MG/5ML suspension Take 15 mLs by mouth every 6 (six) hours as needed for indigestion or heartburn.    Marland Kitchen aspirin 325 MG tablet Take 325 mg daily by mouth.    Marland Kitchen atorvastatin (LIPITOR) 40 MG tablet Take 40 mg daily by mouth.    Marland Kitchen buPROPion (WELLBUTRIN XL) 150 MG 24 hr tablet Take 150 mg by mouth daily.     . calcium carbonate (OS-CAL) 600 MG TABS tablet Take 600 mg by mouth 2 (two) times daily with a meal.     . citalopram (CELEXA) 40 MG tablet Take 40 mg by mouth daily.     . clonazePAM (KLONOPIN) 0.5 MG tablet Take 0.5 mg by mouth daily.     . clopidogrel (PLAVIX) 75 MG  tablet Take 75 mg daily by mouth.    . fluticasone (FLONASE) 50 MCG/ACT nasal spray Place 1 spray into both nostrils daily.     Marland Kitchen loratadine (CLARITIN) 10 MG tablet Take 10 mg by mouth daily.    . polyethylene glycol (MIRALAX / GLYCOLAX) packet Take 17 g by mouth daily as needed.    . risperidone (RISPERDAL) 4 MG tablet Take 4 mg by mouth at bedtime.     . traZODone (DESYREL) 50 MG tablet Take 1 tablet (50 mg total) by mouth at bedtime. (Patient taking differently: Take 100 mg by mouth at bedtime. )    . Cholecalciferol (VITAMIN D-1000 MAX ST) 1000 units tablet Take by mouth.     Current Facility-Administered Medications  Medication Dose Route Frequency Provider Last Rate Last Dose  . lacosamide (VIMPAT) tablet 200 mg  200 mg Oral BID Garvin Fila, MD        PHYSICAL EXAMINATION:  ECOG PERFORMANCE STATUS: 1 - Symptomatic but completely ambulatory   Vitals:   05/04/18 1035  BP: 103/77  Pulse: 72  Resp: 18  Temp: (!) 96.4 F (35.8 C)    Filed Weights   05/04/18 1035  Weight: 178 lb 9.6 oz (81 kg)     Physical Exam  Constitutional: He is oriented to person, place, and time and well-developed, well-nourished, and in no distress. No distress.  HENT:  Head: Normocephalic and atraumatic.  Nose: Nose normal.  Mouth/Throat:  Oropharynx is clear and moist. No oropharyngeal exudate.  Eyes: Pupils are equal, round, and reactive to light. Conjunctivae and EOM are normal. Left eye exhibits no discharge. No scleral icterus.  Neck: Normal range of motion. Neck supple.  Cardiovascular: Normal rate and regular rhythm.  No murmur heard. Pulmonary/Chest: Effort normal and breath sounds normal. No respiratory distress. He has no rales. He exhibits no tenderness.  Abdominal: Soft. Bowel sounds are normal. He exhibits no distension and no mass. There is no tenderness.  Musculoskeletal: Normal range of motion. He exhibits no edema.  Lymphadenopathy:    He has no cervical adenopathy.  Neurological: He is alert and oriented to person, place, and time. No cranial nerve deficit. He exhibits normal muscle tone. Coordination normal.  Skin: Skin is warm and dry. He is not diaphoretic. No erythema.  Psychiatric: Affect normal.       LABORATORY DATA: I have personally reviewed the data as listed: He had labs on with primary care provider on December 15 2016 which showed WBC 5.3 hemoglobin 14.3 MCV 88 RDW 13.8 MPV 11.37 platelet 111 lymphocyte 46.69% absolute lymphocytes 2.5 normal is BMPnormal LFT. It was noted that hepatitis C antibody reactive. Patient was seen by [facility] for workupof her positive hepatitis C antibody Further tests including HCV RNA by PCR was negative.  Hep B core antibody negative, Hb surface antibody nonreactive,A antibody positive,Hep A IgM negative. CBC    Component Value Date/Time   WBC 4.2 05/04/2018 1010   RBC 4.63 05/04/2018 1010   HGB 13.2 05/04/2018 1010   HGB 13.6 04/07/2018 1320   HCT 40.9 05/04/2018 1010   HCT 42.6 04/07/2018 1320   PLT 108 (L) 05/04/2018 1010   PLT 121 (L) 04/07/2018 1320   MCV 88.2 05/04/2018 1010   MCV 86 04/07/2018 1320   MCH 28.5 05/04/2018 1010   MCHC 32.3 05/04/2018 1010   RDW 13.4 05/04/2018 1010   RDW 12.2 (L) 04/07/2018 1320   LYMPHSABS 1.9 05/04/2018  1010   LYMPHSABS 2.3  04/07/2018 1320   MONOABS 0.5 05/04/2018 1010   EOSABS 0.3 05/04/2018 1010   EOSABS 0.4 04/07/2018 1320   BASOSABS 0.0 05/04/2018 1010   BASOSABS 0.0 04/07/2018 1320   CMP Latest Ref Rng & Units 04/07/2018 03/08/2018 03/02/2018  Glucose 65 - 99 mg/dL 122(H) 132(H) 100(H)  BUN 6 - 24 mg/dL _0 Creatinine 0.76 - 1.27 mg/dL 1.21 1.10 0.96  Sodium 134 - 144 mmol/L 140 138 138  Potassium 3.5 - 5.2 mmol/L 3.9 4.0 3.6  Chloride 96 - 106 mmol/L 100 103 108  CO2 20 - 29 mmol/L _1 Calcium 8.7 - 10.2 mg/dL 9.9 10.3 8.1(L)  Total Protein 6.5 - 8.1 g/dL - 8.8(H) -  Total Bilirubin 0.3 - 1.2 mg/dL - 0.7 -  Alkaline Phos 38 - 126 U/L - 53 -  AST 15 - 41 U/L - 28 -  ALT 17 - 63 U/L - 26 -  Results for NESHAWN, AIRD (MRN [id number]) as of 04/27/2017 11:09  Ref. Range 04/20/2017 12:10 04/20/2017 12:21  Folate Latest Ref Range: >5.9 ng/mL  11.0  Vitamin B12 Latest Ref Range: 180 - 914 pg/mL 392    Results for EXANDER, SHAUL (MRN [id number]) as of 04/27/2017 11:09  Ref. Range 04/20/2017 12:21  LDH Latest Ref Range: 98 - 192 U/L 185   Peripheral lab flow cytometry No monoclonal B cell population is detected. kappa:lambda ratio 1.8  There is no loss of, or aberrant expression of, the pan T cell  antigens to  suggest a neoplastic T cell process.  CD4:CD8 ratio 2.5  No circulating blasts are detected. There is no immunophenotypic  evidence  of abnormal myeloid maturation.  Analysis of the leukocyte population shows: granulocytes 50%,  monocytes  10%, lymphocytes 40%, blasts <0.5%, B cells 8%, T cells 30%, NK cells  2%.    RADIOGRAPHIC STUDIES: I have personally reviewed the radiological images as listed and agree with the findings in the report US abdomen 04/23/2017 FINDINGS: Gallbladder: Surgically absent.Common bile duct: Diameter: 3 mm. No intrahepatic, common hepatic, common bile duct dilatation.Liver: No focal lesion identified. Within normal limits inparench ymal  echogenicity. IVC: No abnormality visualized in areas which can be interrogated.Portions of the infrahepatic inferior vena cava are obscured by gas. Pancreas: Pancreas essentially completely obscured by gas. Spleen: Size and appearance within normal limits. Spleen measures 9.1 x 10.0 x 3.9 cm with a measured splenic volume of 186 cubic cm. Right Kidney: Length: 10.8 cm. Echogenicity within normal limits. No hydronephrosis visualized. There is a mildly complex cystic structure in the lower pole the right kidney measuring 4.4 x 2.3 x 2.8 cm, similar to prior study. There is a nearby second mildly complex cystic structure toward the mid right kidney measuring 1.4 x1.4 x 1.7 cm, essentially stable from prior study. There is a cyst in the midportion of the kidney measuring 2.7 x 2.4 x 2.7 cm. No newrenal lesions evident. Left Kidney: Length: 11.5 cm. Echogenicity within normal limits. No hydronephrosis visualized. There is a simple cyst in the mid left kidney measuring 0.9 x 1.0 x 1.1 cm. Abdominal aorta: No aneurysm visualized in areas which can be interrogated. Portions of the aorta are obscured by gas. Other findings: No demonstrable ascites.  IMPRESSION:1.  No liver or splenic lesions.2.  Gallbladder absent.3. Stable mildly complex cystic areas in the right kidney. No new renal lesion identified on the right. Small simple cyst mid left kidney.4. Pancreas essentially completely obscured by gas. Portions of inferior  vena cava and aorta obscured by gas. Visualized portions ofthese structures appear unremarkable.    ASSESSMENT/PLAN 1. Thrombocytopenia ([place])    #Labs reviewed and discussed with patient and group home staff.  Platelet count 10 8000, relatively stable. Patient has had extensive laboratory work-up without revealing etiology explaining thrombocytopenia. Risperidone potentially can cause thrombocytopenia.  Since reticulocyte count has been stable, and with his baseline mental status  and chronic multiple other problems, I will hold bone marrow biopsy for further evaluation. If he develops rapid decline of thrombocytopenia, will trigger bone marrow biopsy at that time. Repeat CBC in 3 months and follow-up in clinic.  All questions were answered. The patient knows to call the clinic with any problems, questions or concerns. Earlie Server, MD, PhD Hematology Oncology [facility] at [facility] Pager- [phone] 05/04/2018

## 2018-05-05 ENCOUNTER — Ambulatory Visit (INDEPENDENT_AMBULATORY_CARE_PROVIDER_SITE_OTHER): Payer: Medicare Other | Admitting: Urology

## 2018-05-05 ENCOUNTER — Encounter: Payer: Self-pay | Admitting: Urology

## 2018-05-05 VITALS — BP 93/67 | HR 76 | Wt 178.0 lb

## 2018-05-05 DIAGNOSIS — N281 Cyst of kidney, acquired: Secondary | ICD-10-CM

## 2018-05-10 ENCOUNTER — Ambulatory Visit
Admission: RE | Admit: 2018-05-10 | Discharge: 2018-05-10 | Disposition: A | Discharge status: Home / Self Care | Payer: Medicare Other | Source: Ambulatory Visit | Attending: Urology | Admitting: Urology

## 2018-05-10 ENCOUNTER — Ambulatory Visit: Payer: Medicare Other

## 2018-05-10 DIAGNOSIS — N281 Cyst of kidney, acquired: Secondary | ICD-10-CM

## 2018-05-24 ENCOUNTER — Ambulatory Visit (INDEPENDENT_AMBULATORY_CARE_PROVIDER_SITE_OTHER): Payer: Medicare Other | Admitting: Urology

## 2018-05-24 ENCOUNTER — Encounter: Payer: Self-pay | Admitting: Urology

## 2018-05-24 VITALS — BP 111/74 | HR 78 | Ht 62.0 in | Wt 179.0 lb

## 2018-05-24 DIAGNOSIS — N281 Cyst of kidney, acquired: Secondary | ICD-10-CM | POA: Diagnosis not present

## 2018-05-24 DIAGNOSIS — I639 Cerebral infarction, unspecified: Secondary | ICD-10-CM

## 2018-05-24 NOTE — Progress Notes (Signed)
05/24/2018 2:34 PM   [name] July 02, 1961 [id number]  Referring provider: [name], [place], [place] [id number]  Chief Complaint  Patient presents with  . other    Acquired complex renal cyst    HPI: Patient is a 57 year old African American male with Down Syndrome who presents today for his RUS report.    Background history Patient is a 57 year old [place] American male who is referred by [name], AGNP for nephrolithiasis from the [place] center with staff worker, [place].  He is not having any flank pain.  He is having frequency and incontinence.  He is having dysuria for Patient denies any gross hematuria, dysuria or suprapubic/flank pain.  Patient denies any fevers, chills, nausea or vomiting.  His UA is positive for many bacteria.  RUS performed at providers location noted a 2 cm stone in the right kidney associated with hydronephrosis.  Former smoker.  Quit 6 months ago.    CT Renal stone study on 04/19/2018 noted enlargement of the primarily hypodense lesion of the right kidney lower pole with curvilinear calcifications along its margins. This had a cystic appearance on ultrasound of 04/23/2017 but is currently larger than at that time. The dense marginal calcification indicates complexity. Given the degree of motion artifact as well as the lack of IV contrast, exclusion of cystic malignancy is problematic. Given the degree of motion artifact on CT, MRI is likely impractical.  Surveillance by ultrasound is likely warranted to assess for any visible soft tissue components.  Fluid density cyst of the right mid kidney noted.  No appreciable definite stones separate from the marginal calcification of the cystic lesion of the right kidney lower pole.  Prominent stool throughout the colon favors constipation.  Lumbar spondylosis and degenerative disc disease.  RUS on 05/10/2018 revealed multiloculated cystic mass or 2 adjacent cysts in the  lower pole of the right kidney, spanning 6.5 x 5.4 x 5.3 cm. There are multiple new cysts an enlarging cysts in that region. On 04/23/2017, the largest cyst measured 4.4 x 2.8 x 2.3 cm, currently measuring 5.9 x 3.1 x 2.5 cm. No solid components are visualized.  Also demonstrated is a 3.4 x 3.0 x 2.5 cm mid right renal cyst. No solid masses were seen by ultrasound.  Patient caregiver states he has no complaints at this time.  Patient denies any gross hematuria, dysuria or suprapubic/flank pain.  Patient denies any fevers, chills, nausea or vomiting.    PMH: Past Medical History:  Diagnosis Date  . COPD (chronic obstructive pulmonary disease) ([place])   . Hepatitis C   . Hx of cholecystectomy   . Hypothyroidism   . Kidney cysts   . Major depressive disorder    Was treated at facility in [place] ([facility]) years ago  . Stroke ([place])   . Tobacco abuse   . Vitamin D deficiency     Surgical History: Past Surgical History:  Procedure Laterality Date  . LAPAROSCOPIC CHOLECYSTECTOMY      Home Medications:  Allergies as of 05/24/2018   No Known Allergies     Medication List        Accurate as of 05/24/18  2:34 PM. Always use your most recent med list.          albuterol 108 (90 Base) MCG/ACT inhaler Commonly known as:  PROVENTIL HFA;VENTOLIN HFA Inhale into the lungs every 6 (six) hours as needed for wheezing or shortness  of breath.   alendronate 70 MG tablet Commonly known as:  FOSAMAX Take 70 mg by mouth once a week. Fridays   aspirin 325 MG tablet Take 325 mg daily by mouth.   atorvastatin 40 MG tablet Commonly known as:  LIPITOR Take 40 mg daily by mouth.   buPROPion 150 MG 24 hr tablet Commonly known as:  WELLBUTRIN XL Take 150 mg by mouth daily.   calcium carbonate 600 MG Tabs tablet Commonly known as:  OS-CAL Take 600 mg by mouth 2 (two) times daily with a meal.   citalopram 40 MG tablet Commonly known as:  CELEXA Take 40 mg by mouth daily.   clonazePAM 0.5 MG  tablet Commonly known as:  KLONOPIN Take 0.5 mg by mouth daily.   clopidogrel 75 MG tablet Commonly known as:  PLAVIX Take 75 mg daily by mouth.   fluticasone 50 MCG/ACT nasal spray Commonly known as:  FLONASE Place 1 spray into both nostrils daily.   GERI-LANTA 200-200-20 MG/5ML suspension Generic drug:  alum & mag hydroxide-simeth Take 15 mLs by mouth every 6 (six) hours as needed for indigestion or heartburn.   loratadine 10 MG tablet Commonly known as:  CLARITIN Take 10 mg by mouth daily.   polyethylene glycol packet Commonly known as:  MIRALAX / GLYCOLAX Take 17 g by mouth daily as needed.   risperidone 4 MG tablet Commonly known as:  RISPERDAL Take 4 mg by mouth at bedtime.   traZODone 50 MG tablet Commonly known as:  DESYREL Take 1 tablet (50 mg total) by mouth at bedtime.   VITAMIN D-1000 MAX ST 1000 units tablet Generic drug:  Cholecalciferol Take by mouth.       Allergies: No Known Allergies  Family History: Family History  Adopted: Yes  Problem Relation Age of Onset  . Diabetes Maternal Grandmother   . Prostate cancer Neg Hx   . Bladder Cancer Neg Hx   . Kidney cancer Neg Hx     Social History:  reports that he has been smoking cigarettes. He has a 19.50 pack-year smoking history. He has never used smokeless tobacco. He reports that he does not drink alcohol or use drugs.  ROS: Patient unable to report due to MR.  Physical Exam: BP 111/74   Pulse 78   Ht 5\' 2"  (1.575 m)   Wt 179 lb (81.2 kg)   BMI 32.74 kg/m   Constitutional: Well nourished. Alert and oriented, No acute distress. HEENT: [place] AT, moist mucus membranes. Trachea midline, no masses. Cardiovascular: No clubbing, cyanosis, or edema. Respiratory: Normal respiratory effort, no increased work of breathing. Skin: No rashes, bruises or suspicious lesions. Lymph: No cervical or inguinal adenopathy. Neurologic: Grossly intact, no focal deficits, moving all 4  extremities. Psychiatric: Normal mood and affect.  Laboratory Data: PSA Trend  0.6 in 03/2018  Lab Results  Component Value Date   WBC 4.2 05/04/2018   HGB 13.2 05/04/2018   HCT 40.9 05/04/2018   MCV 88.2 05/04/2018   PLT 108 (L) 05/04/2018    Lab Results  Component Value Date   CREATININE 1.21 04/07/2018    No results found for: PSA  No results found for: TESTOSTERONE  Lab Results  Component Value Date   HGBA1C 5.9 (H) 05/15/2017    No results found for: TSH     Component Value Date/Time   CHOL 138 05/15/2017 0433   HDL 33 (L) 05/15/2017 0433   CHOLHDL 4.2 05/15/2017 0433   VLDL 25 05/15/2017 0433  [place] 80 05/15/2017 0433    Lab Results  Component Value Date   AST 28 03/08/2018   Lab Results  Component Value Date   ALT 26 03/08/2018   No components found for: ALKALINEPHOPHATASE No components found for: BILIRUBINTOTAL  No results found for: ESTRADIOL  I have reviewed the labs.  Pertinent Imaging CLINICAL DATA:  Follow-up complicated right renal cyst seen on CT dated 04/19/2018 and ultrasound dated 04/23/2017.  EXAM: RENAL / URINARY TRACT ULTRASOUND COMPLETE  COMPARISON:  Abdomen pelvis CT without contrast dated 04/19/2018 and abdomen ultrasound dated 04/23/2017.  FINDINGS: Right Kidney:  Length: 12.0 cm. Normal echotexture. No hydronephrosis. Multiloculated cystic mass or 2 adjacent cysts in the lower pole of the right kidney, spanning 6.5 x 5.4 x 5.3 cm. There are multiple new cysts an enlarging cysts in that region. On 04/23/2017, the largest cyst measured 4.4 x 2.8 x 2.3 cm, currently measuring 5.9 x 3.1 x 2.5 cm. No solid components are visualized.  Also demonstrated is a 3.4 x 3.0 x 2.5 cm mid right renal cyst. No solid masses were seen by ultrasound.  Left Kidney:  Length: 10.8 cm. Normal echotexture. No hydronephrosis. 1.2 cm mid simple cyst.  Bladder:  Appears normal for degree of bladder  distention.  IMPRESSION: Bilateral renal cysts, with progression on the right. No visible solid components.   Electronically Signed   By: Claudie Revering M.D.   On: 05/10/2018 17:05  I have independently reviewed the films  Assessment & Plan:    1. Right renal complex cyst Follow up in 3 months with RUS  2. BPH with LUTS Continue conservative management, avoiding bladder irritants and timed voiding's Patient to return on annual basis for IPSS score, exam and PSA.  Return in about 3 months (around 08/23/2018) for RUS report .  These notes generated with voice recognition software. I apologize for typographical errors.  Zara Council, PA-C  [facility] [address]  [place],  [id number] [phone]

## 2018-05-27 ENCOUNTER — Ambulatory Visit (INDEPENDENT_AMBULATORY_CARE_PROVIDER_SITE_OTHER): Payer: Medicare Other | Admitting: Adult Health

## 2018-05-27 ENCOUNTER — Encounter: Payer: Self-pay | Admitting: Adult Health

## 2018-05-27 VITALS — BP 92/67 | HR 64 | Ht 62.0 in | Wt 179.0 lb

## 2018-05-27 DIAGNOSIS — G40909 Epilepsy, unspecified, not intractable, without status epilepticus: Secondary | ICD-10-CM

## 2018-05-27 DIAGNOSIS — Z8673 Personal history of transient ischemic attack (TIA), and cerebral infarction without residual deficits: Secondary | ICD-10-CM | POA: Diagnosis not present

## 2018-05-27 DIAGNOSIS — E785 Hyperlipidemia, unspecified: Secondary | ICD-10-CM | POA: Diagnosis not present

## 2018-05-27 DIAGNOSIS — I10 Essential (primary) hypertension: Secondary | ICD-10-CM | POA: Diagnosis not present

## 2018-05-27 NOTE — Progress Notes (Signed)
[facility] [address] [place]. [place] [address] [phone]       OFFICE FOLLOW-UP NOTE  Mario. Mario Liu Date of Birth:  Sep 25, 1960 Medical Record Number:  [id number]   HPI:  Initial visit 08/17/17 Mario Liu is a 57 year old African-American male with Down syndrome who is seen today for first office follow-up visit following hospital admission for stroke in August 2018. He is accompanied by his caregiver from group home in [place] where he lives. History is obtained from them as well as review of electronic medical records. I have personally reviewed imaging films.Mario Liu is a 57yo male with PMH significant for depression, Down's syndrome (currently living in a group home), COPD, and tobacco use who presents with aphasia since this morning at around 7am. History obtained from group home caregivers. Patient was in his usual state of health last night before he went to bed around 10pm. He was woken this morning and got up to get dressed. He came down to the kitchen and his caregiver noted that he was holding his underwear and a shirt in his hands, was not talking, and kept looking to his left side. No gait abnormalities. Caregivers state that he has not complained of any chest pain, palpitations, trouble breathing, fevers, or other symptoms recently. Patient has never had a stroke before. They called EMS and he was brought to [facility]. Head CT at [facility] demonstrated hypodensity in L MCA territory, consistent with infarct. He was transferred to [facility] for further evaluation and potential intervention. At baseline, patient is able to walk on his own, talk, dress himself, do some chores, but unable to complete activities of daily leaving, including cooking, cleaning, and needs some help with bathing. On interviewing the patient, he endorses that he is hungry, has trouble breathing, and his left shoulder hurts. Denies chest pain, abdominal pain, or headaches. Caregivers state that he did  recognize who they were and that his speech is still different than it has been. He does smoke ~1ppd, no alcohol use or other illicit drugs. His sister, Mario Liu, is his legal guardian. She lives in [place] and is on her way to [place]. He has never had a stroke before, but caregivers state that a few years ago, he did have a seizure that lasted a few minutes where his whole body was tremoring/shaking. He came out of the seizure on his own and was confused for a little but returned to baseline soon after. He has never had another seizure. Patient was not administered IV t-PA secondary to unclear time of onset. CT angiogram and perfusion were obtained which showed a small mismatch but given patient's poor baseline functioning he was not considered a candidate for endovascular treatment. MRI scan of the brain showed a large left MCA infarct with small petechial hemorrhagic transformation. 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 Lower extremity venous Dopplers were negative for DVT. Transthoracic echo showed normal ejection fraction. Bubble study was positive for small PFO/atrial septal defect. LDL cholesterol was elevated to 80 mg percent and hemoglobin A1c was 5.9. It was decided not to consider further workup with a TEE and loop recorder given his poor functional baseline he is not a candidate for PFO closure on anticoagulation. Patient was started on Lipitor and aspirin. He has done well. His speech is still impaired and slurred but the can be understood with some difficulty. He is able to return almost back to his previous baseline. He can swallow well but at times chokes if he  eats too quickly. His blood pressure seems to be well controlled and today it is [id number]. Is tolerating Lipitor without muscle aches and pains. He has no complaints today.  11/24/2017 visit PS :Patient is seen today for follow-up after his last visit 3 months ago. He is accompanied by his caregiver from the group home. Patient  is unable to provide history. I have reviewed notes which accompany him and also I  called and spoke to a nurse at the group home where he stays. Patient apparently was visiting family during Christmas time and had a bunch of seizures. Detailed description is not available. Patient was initially started on Keppra but for some reason was switched to Vimpat. He is currently on 200 mg twice daily and apparently tolerating it well and has had no witnessed seizures in the group home. He is otherwise in good health with no neurological changes. His tolerating aspirin without any bruising or bleeding. His blood pressure seems well controlled. He has not had any recurrent stroke or TIA symptoms. Patient has not had any brain imaging or EEG study done for seizures.  Interval history 05/27/2018: Patient is being seen today for scheduled follow-up visit and is accompanied by group home caregiver.  Since previous appointment, he was seen at [facility] on 03/08/2018 with concerns of altered mental status.  He was seen previously on 03/01/2018 with similar complaints and was diagnosed with a UTI at that time.  UTI negative on 03/08/2018 admission.  MRI head reviewed and was negative for acute abnormality.  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  Patient was discharged back to group home in stable condition.  Patient states overall he has been doing well.  He has not had additional altered mental status episodes since most recent hospitalization.  On the hospitalization of 03/08/2018, Vimpat was discontinued for unclear reasons -possibly due to potential side effects of increased fatigue and altered mental status.  Since thisdiscontinuation, patient has not had any seizure like activity.  He has been more active in his group home.   Continues to take Plavix and Lipitor without side effects for secondary stroke prevention.  He was seen by psychiatry who restarted him on his psychiatric medications.  Denies new or worsening stroke/TIA symptoms or seizure-like activity.   ROS:   14 system review of systems is positive for no complaints and all other systems negative PMH:  Past Medical History:  Diagnosis Date  . COPD (chronic obstructive pulmonary disease) ([place])   . Hepatitis C   . Hx of cholecystectomy   . Hypothyroidism   . Kidney cysts   . Major depressive disorder    Was treated at facility in [place] (D and G) years ago  . Stroke ([place])   . Tobacco abuse   . Vitamin D deficiency     Social History:  Social History   Socioeconomic History  . Marital status: Single    Spouse name: Not on file  . Number of children: Not on file  . Years of education: Not on file  . Highest education level: Not on file  Occupational History  . Not on file  Social Needs  . Financial resource strain: Not on file  . Food insecurity:    Worry: Not on file    Inability: Not on file  . Transportation needs:  Medical: Not on file    Non-medical: Not on file  Tobacco Use  . Smoking status: Current Every Day Smoker    Packs/day: 0.50    Years: 39.00    Pack years: 19.50    Types: Cigarettes  . Smokeless tobacco: Never Used  . Tobacco comment: pt is still smoking  Substance and Sexual Activity  . Alcohol use: No  . Drug use: No  . Sexual activity: Not on file  Lifestyle  . Physical activity:    Days per week: Not on file    Minutes per session: Not on file  . Stress: Not on file  Relationships  . Social connections:    Talks on phone: Not on file    Gets together: Not on file    Attends religious service: Not on file    Active member of club or organization: Not on file    Attends meetings of clubs or organizations: Not on file    Relationship status: Not on file  . Intimate partner violence:    Fear of  current or ex partner: Not on file    Emotionally abused: Not on file    Physically abused: Not on file    Forced sexual activity: Not on file  Other Topics Concern  . Not on file  Social History Narrative  . Not on file    Medications:   Current Outpatient Medications on File Prior to Visit  Medication Sig Dispense Refill  . albuterol (PROVENTIL HFA;VENTOLIN HFA) 108 (90 Base) MCG/ACT inhaler Inhale into the lungs every 6 (six) hours as needed for wheezing or shortness of breath.    Marland Kitchen alendronate (FOSAMAX) 70 MG tablet Take 70 mg by mouth once a week. Fridays    . alum & mag hydroxide-simeth (GERI-LANTA) 200-200-20 MG/5ML suspension Take 15 mLs by mouth every 6 (six) hours as needed for indigestion or heartburn.    Marland Kitchen aspirin 325 MG tablet Take 325 mg daily by mouth.    Marland Kitchen atorvastatin (LIPITOR) 40 MG tablet Take 40 mg daily by mouth.    Marland Kitchen buPROPion (WELLBUTRIN XL) 150 MG 24 hr tablet Take 150 mg by mouth daily.     . calcium carbonate (OS-CAL) 600 MG TABS tablet Take 600 mg by mouth 2 (two) times daily with a meal.     . Cholecalciferol (VITAMIN D-1000 MAX ST) 1000 units tablet Take by mouth.    . citalopram (CELEXA) 40 MG tablet Take 40 mg by mouth daily.     . clonazePAM (KLONOPIN) 0.5 MG tablet Take 0.5 mg by mouth daily.     . clopidogrel (PLAVIX) 75 MG tablet Take 75 mg daily by mouth.    . fluticasone (FLONASE) 50 MCG/ACT nasal spray Place 1 spray into both nostrils daily.     Marland Kitchen loratadine (CLARITIN) 10 MG tablet Take 10 mg by mouth daily.    . polyethylene glycol (MIRALAX / GLYCOLAX) packet Take 17 g by mouth daily as needed.    . risperidone (RISPERDAL) 4 MG tablet Take 4 mg by mouth at bedtime.     . traZODone (DESYREL) 50 MG tablet Take 1 tablet (50 mg total) by mouth at bedtime. (Patient taking differently: Take 100 mg by mouth at bedtime. )     Current Facility-Administered Medications on File Prior to Visit  Medication Dose Route Frequency Provider Last Rate Last Dose  .  lacosamide (VIMPAT) tablet 200 mg  200 mg Oral BID Garvin Fila, MD  Allergies:  No Known Allergies  Physical Exam General: Obese middle-aged pleasant African-American male, seated, in no evident distress Head: head normocephalic and atraumatic.  Neck: supple with no carotid or supraclavicular bruits Cardiovascular: regular rate and rhythm, no murmurs Musculoskeletal: no deformity Skin:  no rash/petichiae Vascular:  Normal pulses all extremities  Vitals:   05/27/18 1015  BP: 92/67  Pulse: 64   Neurologic Exam Mental Status: Awake and fully alert. Diminished attention, registration and recall. Speech is slurred but can be understood with some difficulty. Can comprehend midline and one-step commands only. Attention span, concentration and fund of knowledge poor. Mood and affect appropriate and cooperative during the assessment.  Cranial Nerves: Fundoscopic exam not done. Pupils equal, briskly reactive to light. Extraocular movements full without nystagmus. Visual fields full to confrontation. Hearing intact. Facial sensation intact. Face, tongue, palate moves normally and symmetrically.  Motor: Normal bulk and tone. Normal strength in all tested extremity muscles. Sensory.: intact to touch ,pinprick .position and vibratory sensation.  Coordination: Rapid alternating movements normal in all extremities. Finger-to-nose and heel-to-shin performed accurately bilaterally. Gait and Station: Arises from chair without difficulty. Stance is normal. Gait demonstrates normal stride length and balance . Able to heel, toe and tandem walk with difficulty.  Reflexes: 1+ and symmetric. Toes downgoing.      IMAGING/LABS:  CT HEAD WO CONTRAST 03/01/2018 IMPRESSION: Changes of prior left MCA infarct with encephalomalacia change. No acute abnormality is noted.  Mario BRAIN WO CONTRAST 03/02/2018 IMPRESSION: 1. No acute intracranial process on this moderately motion degraded examination. 2.  Old LEFT MCA territory infarct. 3. Abnormally nasal cavity mucosal thickening, recommend direct Inspection  CT HEAD WO CONTRAST 03/08/2018 IMPRESSION: 1. No acute intracranial abnormality. 2. Stable chronic left posterior MCA distribution infarct.  Mario BRAIN WO CONTRAST 03/08/2018 IMPRESSION: 1. No acute intracranial abnormality. 2. Stable chronic encephalomalacia involving the left parietal and temporal lobe.  EEG ADULT 01/11/2018 Summary Normal electroencephalogram, awake, asleep and with activation procedures. There are no focal lateralizing or epileptiform features.     ASSESSMENT: 57 year old African-American male with Down syndrome with cryptogenic left MCA infarct in August 2018 who seems to be doing reasonably well. Vascular risk factors of hypertension and hyperlipidemia and mild obesity. Patient is being seen today for scheduled office visit with recent hospitalizations due to AMS.  Vimpat was stopped possibly due to potential side effects and since this time, patient has not had additional AMS episodes or seizure activity.    PLAN: -Continue clopidogrel 75 mg daily  and Lipitor for secondary stroke prevention -F/u with PCP regarding your HLD and HTN management -No indication to restart antiepileptics at this time as patient has been stable without additional seizure activity since discontinuation of Vimpat.  If patient does have additional seizure activity, consider restarting antiepileptics if needed -Advised to continue to stay active and maintain a healthy diet -continue to monitor BP at home -Maintain strict control of hypertension with blood pressure goal below 130/90, diabetes with hemoglobin A1c goal below 6.5% and cholesterol with LDL cholesterol (bad cholesterol) goal below 70 mg/dL. I also advised the patient to eat a healthy diet with plenty of whole grains, cereals, fruits and vegetables, exercise regularly and maintain ideal body weight.  Follow up in 6  months or call earlier if needed or with additional seizure activity  Greater than 50% of time during this 25 minute visit was spent on counseling,explanation of diagnosis of stroke, seizures cognitive impairment, planning of further management, discussion with patient and family  and coordination of care  Mario Liu, AGNP-BC  [facility] [address] [place], [place] [address]  Phone [phone] Fax [phone] Note: This document was prepared with digital dictation and possible smart phrase technology. Any transcriptional errors that result from this process are unintentional.

## 2018-05-27 NOTE — Patient Instructions (Signed)
Continue clopidogrel 75 mg daily  and lipitor  for secondary stroke prevention  Continue to follow up with PCP regarding cholesterol and blood pressure management   We will not restart anti seizure medications at this time as he has been stable without additional seizure activity. We will consider in future if needed  Continue to stay active and maintain a healthy diet  Continue to monitor blood pressure at home  Maintain strict control of hypertension with blood pressure goal below 130/90, diabetes with hemoglobin A1c goal below 6.5% and cholesterol with LDL cholesterol (bad cholesterol) goal below 70 mg/dL. I also advised the patient to eat a healthy diet with plenty of whole grains, cereals, fruits and vegetables, exercise regularly and maintain ideal body weight.  Followup in the future with me in 6 months or call earlier if needed       Thank you for coming to see [place] at [facility]. I hope we have been able to provide you high quality care today.  You may receive a patient satisfaction survey over the next few weeks. We would appreciate your feedback and comments so that we may continue to improve ourselves and the health of our patients.

## 2018-05-30 NOTE — Progress Notes (Signed)
I agree with the above plan 

## 2018-07-05 ENCOUNTER — Emergency Department: Payer: Medicare Other

## 2018-07-05 ENCOUNTER — Other Ambulatory Visit: Payer: Self-pay

## 2018-07-05 ENCOUNTER — Encounter: Payer: Self-pay | Admitting: Emergency Medicine

## 2018-07-05 ENCOUNTER — Observation Stay: Payer: Medicare Other

## 2018-07-05 ENCOUNTER — Inpatient Hospital Stay
Admission: EM | Admit: 2018-07-05 | Discharge: 2018-07-07 | DRG: 683 | Disposition: A | Discharge status: Home / Self Care | Payer: Medicare Other | Attending: Internal Medicine | Admitting: Internal Medicine

## 2018-07-05 DIAGNOSIS — J449 Chronic obstructive pulmonary disease, unspecified: Secondary | ICD-10-CM | POA: Diagnosis present

## 2018-07-05 DIAGNOSIS — Z9049 Acquired absence of other specified parts of digestive tract: Secondary | ICD-10-CM

## 2018-07-05 DIAGNOSIS — N3000 Acute cystitis without hematuria: Secondary | ICD-10-CM | POA: Diagnosis present

## 2018-07-05 DIAGNOSIS — N179 Acute kidney failure, unspecified: Secondary | ICD-10-CM | POA: Diagnosis not present

## 2018-07-05 DIAGNOSIS — Z79899 Other long term (current) drug therapy: Secondary | ICD-10-CM

## 2018-07-05 DIAGNOSIS — G40909 Epilepsy, unspecified, not intractable, without status epilepticus: Secondary | ICD-10-CM | POA: Diagnosis present

## 2018-07-05 DIAGNOSIS — R531 Weakness: Secondary | ICD-10-CM | POA: Diagnosis not present

## 2018-07-05 DIAGNOSIS — F17210 Nicotine dependence, cigarettes, uncomplicated: Secondary | ICD-10-CM | POA: Diagnosis present

## 2018-07-05 DIAGNOSIS — Q909 Down syndrome, unspecified: Secondary | ICD-10-CM

## 2018-07-05 DIAGNOSIS — Z7951 Long term (current) use of inhaled steroids: Secondary | ICD-10-CM

## 2018-07-05 DIAGNOSIS — Z8673 Personal history of transient ischemic attack (TIA), and cerebral infarction without residual deficits: Secondary | ICD-10-CM

## 2018-07-05 DIAGNOSIS — I951 Orthostatic hypotension: Secondary | ICD-10-CM | POA: Diagnosis present

## 2018-07-05 DIAGNOSIS — Z7982 Long term (current) use of aspirin: Secondary | ICD-10-CM

## 2018-07-05 DIAGNOSIS — R4182 Altered mental status, unspecified: Secondary | ICD-10-CM | POA: Diagnosis present

## 2018-07-05 DIAGNOSIS — E559 Vitamin D deficiency, unspecified: Secondary | ICD-10-CM | POA: Diagnosis present

## 2018-07-05 DIAGNOSIS — Z7902 Long term (current) use of antithrombotics/antiplatelets: Secondary | ICD-10-CM

## 2018-07-05 DIAGNOSIS — E860 Dehydration: Secondary | ICD-10-CM | POA: Diagnosis present

## 2018-07-05 DIAGNOSIS — F329 Major depressive disorder, single episode, unspecified: Secondary | ICD-10-CM | POA: Diagnosis present

## 2018-07-05 DIAGNOSIS — Z833 Family history of diabetes mellitus: Secondary | ICD-10-CM

## 2018-07-05 LAB — COMPREHENSIVE METABOLIC PANEL
ALBUMIN: 3.9 g/dL (ref 3.5–5.0)
ALK PHOS: 46 U/L (ref 38–126)
ALT: 26 U/L (ref 0–44)
ANION GAP: 11 (ref 5–15)
AST: 30 U/L (ref 15–41)
BILIRUBIN TOTAL: 1.1 mg/dL (ref 0.3–1.2)
BUN: 19 mg/dL (ref 6–20)
CALCIUM: 9.4 mg/dL (ref 8.9–10.3)
CO2: 25 mmol/L (ref 22–32)
CREATININE: 1.54 mg/dL — AB (ref 0.61–1.24)
Chloride: 103 mmol/L (ref 98–111)
GFR calc Af Amer: 57 mL/min — ABNORMAL LOW (ref >60)
GFR calc non Af Amer: 49 mL/min — ABNORMAL LOW (ref >60)
GLUCOSE: 128 mg/dL — AB (ref 70–99)
Potassium: 4 mmol/L (ref 3.5–5.1)
Sodium: 139 mmol/L (ref 135–145)
TOTAL PROTEIN: 7.5 g/dL (ref 6.5–8.1)

## 2018-07-05 LAB — URINALYSIS, ROUTINE W REFLEX MICROSCOPIC
BILIRUBIN URINE: NEGATIVE
GLUCOSE, UA: NEGATIVE mg/dL
Ketones, ur: 5 mg/dL — AB
NITRITE: NEGATIVE
PH: 6 (ref 5.0–8.0)
Protein, ur: 30 mg/dL — AB
Specific Gravity, Urine: 1.025 (ref 1.005–1.030)

## 2018-07-05 LAB — CBC WITH DIFFERENTIAL/PLATELET
Abs Immature Granulocytes: 0.09 10*3/uL — ABNORMAL HIGH (ref 0.00–0.07)
BASOS ABS: 0 10*3/uL (ref 0.0–0.1)
Basophils Relative: 0 %
EOS PCT: 0 %
Eosinophils Absolute: 0 10*3/uL (ref 0.0–0.5)
HEMATOCRIT: 41.2 % (ref 39.0–52.0)
HEMOGLOBIN: 12.9 g/dL — AB (ref 13.0–17.0)
Immature Granulocytes: 1 %
LYMPHS ABS: 1.5 10*3/uL (ref 0.7–4.0)
LYMPHS PCT: 19 %
MCH: 28.2 pg (ref 26.0–34.0)
MCHC: 31.3 g/dL (ref 30.0–36.0)
MCV: 90.2 fL (ref 80.0–100.0)
MONO ABS: 0.6 10*3/uL (ref 0.1–1.0)
Monocytes Relative: 8 %
Neutro Abs: 5.5 10*3/uL (ref 1.7–7.7)
Neutrophils Relative %: 72 %
Platelets: 100 10*3/uL — ABNORMAL LOW (ref 150–400)
RBC: 4.57 MIL/uL (ref 4.22–5.81)
RDW: 12.4 % (ref 11.5–15.5)
WBC: 7.6 10*3/uL (ref 4.0–10.5)
nRBC: 0 % (ref 0.0–0.2)

## 2018-07-05 LAB — TROPONIN I: Troponin I: <0.03 ng/mL (ref <0.03)

## 2018-07-05 LAB — GLUCOSE, CAPILLARY: GLUCOSE-CAPILLARY: 117 mg/dL — AB (ref 70–99)

## 2018-07-05 MED ORDER — CITALOPRAM HYDROBROMIDE 20 MG PO TABS
40.0000 mg | ORAL_TABLET | Freq: Every day | ORAL | Status: DC
  Administered 2018-07-05 – 2018-07-07 (×3): 40 mg via ORAL
  Filled 2018-07-05 (×3): qty 2

## 2018-07-05 MED ORDER — ONDANSETRON HCL 4 MG PO TABS: 4.0000 mg | ORAL_TABLET | Freq: Four times a day (QID) | ORAL | Status: DC | PRN

## 2018-07-05 MED ORDER — POLYETHYLENE GLYCOL 3350 17 G PO PACK: 17.0000 g | PACK | Freq: Every day | ORAL | Status: DC | PRN

## 2018-07-05 MED ORDER — SODIUM CHLORIDE 0.9 % IV SOLN
Freq: Once | INTRAVENOUS | Status: AC
  Administered 2018-07-05: 13:00:00 via INTRAVENOUS

## 2018-07-05 MED ORDER — SODIUM CHLORIDE 0.9 % IV BOLUS
1000.0000 mL | Freq: Once | INTRAVENOUS | Status: AC
  Administered 2018-07-05: 1000 mL via INTRAVENOUS

## 2018-07-05 MED ORDER — ALUM & MAG HYDROXIDE-SIMETH 200-200-20 MG/5ML PO SUSP: 15.0000 mL | Freq: Four times a day (QID) | ORAL | Status: DC | PRN

## 2018-07-05 MED ORDER — ACETAMINOPHEN 650 MG RE SUPP: 650.0000 mg | Freq: Four times a day (QID) | RECTAL | Status: DC | PRN

## 2018-07-05 MED ORDER — ACETAMINOPHEN 325 MG PO TABS: 650.0000 mg | ORAL_TABLET | Freq: Four times a day (QID) | ORAL | Status: DC | PRN

## 2018-07-05 MED ORDER — FLUTICASONE PROPIONATE 50 MCG/ACT NA SUSP
2.0000 | Freq: Every day | NASAL | Status: DC
  Administered 2018-07-05 – 2018-07-07 (×2): 2 via NASAL
  Filled 2018-07-05: qty 16

## 2018-07-05 MED ORDER — ASPIRIN EC 325 MG PO TBEC
325.0000 mg | DELAYED_RELEASE_TABLET | Freq: Every day | ORAL | Status: DC
  Administered 2018-07-05 – 2018-07-07 (×3): 325 mg via ORAL
  Filled 2018-07-05 (×3): qty 1

## 2018-07-05 MED ORDER — CALCIUM CARBONATE ANTACID 500 MG PO CHEW
600.0000 mg | CHEWABLE_TABLET | Freq: Two times a day (BID) | ORAL | Status: DC
  Administered 2018-07-05 – 2018-07-07 (×3): 600 mg via ORAL
  Filled 2018-07-05 (×3): qty 3

## 2018-07-05 MED ORDER — ATORVASTATIN CALCIUM 20 MG PO TABS
40.0000 mg | ORAL_TABLET | Freq: Every day | ORAL | Status: DC
  Administered 2018-07-05 – 2018-07-07 (×3): 40 mg via ORAL
  Filled 2018-07-05 (×3): qty 2

## 2018-07-05 MED ORDER — BENZTROPINE MESYLATE 1 MG PO TABS
1.0000 mg | ORAL_TABLET | Freq: Every day | ORAL | Status: DC
  Administered 2018-07-05 – 2018-07-06 (×2): 1 mg via ORAL
  Filled 2018-07-05 (×4): qty 1

## 2018-07-05 MED ORDER — CLONAZEPAM 0.5 MG PO TABS
0.5000 mg | ORAL_TABLET | Freq: Every day | ORAL | Status: DC
  Administered 2018-07-05 – 2018-07-07 (×3): 0.5 mg via ORAL
  Filled 2018-07-05 (×3): qty 1

## 2018-07-05 MED ORDER — LORATADINE 10 MG PO TABS
10.0000 mg | ORAL_TABLET | Freq: Every day | ORAL | Status: DC
  Administered 2018-07-05 – 2018-07-07 (×3): 10 mg via ORAL
  Filled 2018-07-05 (×3): qty 1

## 2018-07-05 MED ORDER — VITAMIN D 1000 UNITS PO TABS
1000.0000 [IU] | ORAL_TABLET | Freq: Every day | ORAL | Status: DC
  Administered 2018-07-05 – 2018-07-07 (×3): 1000 [IU] via ORAL
  Filled 2018-07-05 (×3): qty 1

## 2018-07-05 MED ORDER — BUPROPION HCL ER (XL) 150 MG PO TB24
150.0000 mg | ORAL_TABLET | Freq: Every day | ORAL | Status: DC
  Administered 2018-07-05 – 2018-07-07 (×3): 150 mg via ORAL
  Filled 2018-07-05 (×4): qty 1

## 2018-07-05 MED ORDER — ONDANSETRON HCL 4 MG/2ML IJ SOLN: 4.0000 mg | Freq: Four times a day (QID) | INTRAMUSCULAR | Status: DC | PRN

## 2018-07-05 MED ORDER — ENOXAPARIN SODIUM 40 MG/0.4ML ~~LOC~~ SOLN
40.0000 mg | SUBCUTANEOUS | Status: DC
  Administered 2018-07-05 – 2018-07-06 (×2): 40 mg via SUBCUTANEOUS
  Filled 2018-07-05 (×2): qty 0.4

## 2018-07-05 MED ORDER — CLOPIDOGREL BISULFATE 75 MG PO TABS
75.0000 mg | ORAL_TABLET | Freq: Every day | ORAL | Status: DC
  Administered 2018-07-05 – 2018-07-07 (×3): 75 mg via ORAL
  Filled 2018-07-05 (×3): qty 1

## 2018-07-05 NOTE — ED Triage Notes (Signed)
Pt arrived via EMS from [facility] with reports of fall around 0400, but was found around 0600. Pt has hx of CVA 2 mos ago and has some generalized lower extremity weakness.  Per EMS staff at group home report the patient is not acting normal and is more confused.  Per EMS pt did walk with walker on scene, but is unsteady without it.  Pt has urine odor noted on arrival. Speech is clear and pt is able to answer questions.

## 2018-07-05 NOTE — ED Notes (Signed)
Attempted IV access x 1 in right AC by this RN, unsuccessful.

## 2018-07-05 NOTE — ED Notes (Addendum)
Pt resting quietly at this time, group home member at bedside.  Waiting for MRI and admitting provider.  Left message for legal guardian (sister) to call back.

## 2018-07-05 NOTE — ED Provider Notes (Signed)
[facility] Emergency Department Provider Note  Time seen: 7:38 AM  I have reviewed the triage vital signs and the nursing notes.   HISTORY  Chief Complaint Weakness    HPI Mario Liu is a 57 y.o. male with a past medical history of Down syndrome, seizure disorder, major depression, left MCA CVA, COPD, presents from a group home for altered mental status.  According to EMS report patient was found on the ground around 6 AM this morning, believe her to have fallen down at some point overnight.  Patient had urinated on himself.  Per EMS the patient had no complaints, did not want to come to the hospital but group home staff encouraged the patient to come to the hospital for evaluation.  Here the patient is not able to tell me where he is or the year, it is not clear what his baseline is normally.  He overall appears well however, has no complaints, lying in bed appears comfortable.   Past Medical History:  Diagnosis Date  . COPD (chronic obstructive pulmonary disease) ([place])   . Hepatitis C   . Hx of cholecystectomy   . Hypothyroidism   . Kidney cysts   . Major depressive disorder    Was treated at facility in [place] (D and G) years ago  . Stroke ([place])   . Tobacco abuse   . Vitamin D deficiency     Patient Active Problem List   Diagnosis Date Noted  . Altered mental status 03/09/2018  . Recurrent falls 03/01/2018  . Seizure disorder ([place]) 11/24/2017  . Major depressive disorder   . Hepatitis C antibody test positive 05/16/2017  . Acute ischemic left MCA stroke ([place])   . Down's syndrome   . PFO with atrial septal aneurysm     Past Surgical History:  Procedure Laterality Date  . LAPAROSCOPIC CHOLECYSTECTOMY      Prior to Admission medications   Medication Sig Start Date End Date Taking? Authorizing Provider  albuterol (PROVENTIL HFA;VENTOLIN HFA) 108 (90 Base) MCG/ACT inhaler Inhale into the lungs every 6 (six) hours as needed for wheezing or  shortness of breath.    [provider]  alendronate (FOSAMAX) 70 MG tablet Take 70 mg by mouth once a week. Fridays    [provider]  alum & mag hydroxide-simeth (GERI-LANTA) 200-200-20 MG/5ML suspension Take 15 mLs by mouth every 6 (six) hours as needed for indigestion or heartburn.    [provider]  aspirin 325 MG tablet Take 325 mg daily by mouth.    [provider]  atorvastatin (LIPITOR) 40 MG tablet Take 40 mg daily by mouth.    [provider]  buPROPion (WELLBUTRIN XL) 150 MG 24 hr tablet Take 150 mg by mouth daily.     [provider]  calcium carbonate (OS-CAL) 600 MG TABS tablet Take 600 mg by mouth 2 (two) times daily with a meal.     [provider]  Cholecalciferol (VITAMIN D-1000 MAX ST) 1000 units tablet Take by mouth.    [provider]  citalopram (CELEXA) 40 MG tablet Take 40 mg by mouth daily.     [provider]  clonazePAM (KLONOPIN) 0.5 MG tablet Take 0.5 mg by mouth daily.     [provider]  clopidogrel (PLAVIX) 75 MG tablet Take 75 mg daily by mouth.    [provider]  fluticasone (FLONASE) 50 MCG/ACT nasal spray Place 1 spray into both nostrils daily.  [provider]  loratadine (CLARITIN) 10 MG tablet Take 10 mg by mouth daily.    [provider]  polyethylene glycol (MIRALAX / GLYCOLAX) packet Take 17 g by mouth daily as needed.    [provider]  risperidone (RISPERDAL) 4 MG tablet Take 4 mg by mouth at bedtime.     [provider]  traZODone (DESYREL) 50 MG tablet Take 1 tablet (50 mg total) by mouth at bedtime. Patient taking differently: Take 100 mg by mouth at bedtime.  03/11/18   Loletha Grayer, MD    No Known Allergies  Family History  Adopted: Yes  Problem Relation Age of Onset  . Diabetes Maternal Grandmother   . Prostate cancer Neg Hx   . Bladder Cancer Neg Hx   . Kidney cancer Neg Hx     Social  History Social History   Tobacco Use  . Smoking status: Current Every Day Smoker    Packs/day: 0.50    Years: 39.00    Pack years: 19.50    Types: Cigarettes  . Smokeless tobacco: Never Used  . Tobacco comment: pt is still smoking  Substance Use Topics  . Alcohol use: No  . Drug use: No    Review of Systems Unable to obtain an adequate/accurate review of systems secondary to altered mental status/baseline mental status.  ____________________________________________   PHYSICAL EXAM:  Constitutional: Patient is awake and alert, overall well-appearing lying in bed, no distress. Eyes: Normal exam ENT   Head: Normocephalic and atraumatic.   Mouth/Throat: Mucous membranes are moist. Cardiovascular: Normal rate, regular rhythm Respiratory: Normal respiratory effort without tachypnea nor retractions. Breath sounds are clear  Gastrointestinal: Soft, appears nontender without reaction to palpation.  No distention. Musculoskeletal: Nontender with normal range of motion in all extremities. Neurologic: Patient is somewhat slurred speech although is not clear what his baseline is.  Is able to follow basic commands such as grabbing my hands or squeezing fingers.  Appears to have equal grip strength bilaterally. Skin:  Skin is warm, dry and intact.  Psychiatric: Mood and affect are normal.   ____________________________________________    EKG  EKG reviewed and interpreted by myself shows sinus rhythm at 79 bpm with a narrow QRS, normal axis, normal intervals, no concerning ST changes.  ____________________________________________    RADIOLOGY  CT is negative for acute infarct.  Prior stable infarct. X-ray negative  ____________________________________________   INITIAL IMPRESSION / ASSESSMENT AND PLAN / ED COURSE  Pertinent labs & imaging results that were available during my care of the patient were reviewed by me and considered in my medical decision making (see chart  for details).  Patient presents to the emergency department for altered mental status, found on the floor this morning.  Patient is not able to give any history or review of systems however it is not clear if this is baseline for the patient or if this is new.  And patient overall appears well but he does appear somewhat weak.  Has a history of a left CVA, it is unclear what deficits he has from the stroke.  Appears to have equal grip strength on my exam.  He concerningly patient's blood pressure found to be low 87/67 we will dose IV fluids.  Differential at this time would include infectious etiology, metabolic or electrolyte abnormality, dehydration, CVA/ICH.  We will check labs, CT scan of the head, chest x-ray and continue to closely monitor.  EKG shows no concerning findings at this time.  Group home  worker is here with the patient.  Normally cares for the patient 5 days a week.  States normally he ambulates without any assistance is very talkative.  Since this morning patient has been extremely fatigued, unable to ambulate this morning has been very weak unable to stand unassisted.  Has been very confused.  States at baseline patient has dementia and would not be able to tell you the date but would be able to tell you his name and birthday.  States normally he is very talkative and today has had slurred speech and is not very talkative.  Given the acute decrease in mental status today compared to his baseline we will admit to the hospital service for continued work-up.  Patient did recently have a stroke several months ago, CVA remains on the differential this time.  ____________________________________________   FINAL CLINICAL IMPRESSION(S) / ED DIAGNOSES  Weakness    Harvest Dark, MD 07/05/18 [phone]

## 2018-07-05 NOTE — ED Notes (Signed)
Pt in CT.

## 2018-07-05 NOTE — Progress Notes (Signed)
   07/05/18 1600  What Happened  Was fall witnessed? No  Was patient injured? No  Patient found on floor  Found by Staff-comment Regino Schultze RN )  Stated prior activity other (comment) (Pt did not know how he ended up on the floor. )  Follow Up  MD notified Dr. Fritzi Mandes   Time MD notified [phone]  Family notified Yes-comment (Legal Guardian acknowledged )  Time family notified 1630  Additional tests No  Simple treatment  (none)  Adult Fall Risk Assessment  Risk Factor Category (scoring not indicated) High fall risk per protocol (document High fall risk)  Age 57  Fall History: Fall within 6 months prior to admission 5  Elimination; Bowel and/or Urine Incontinence 2  Elimination; Bowel and/or Urine Urgency/Frequency 2  Medications: includes PCA/Opiates, Anti-convulsants, Anti-hypertensives, Diuretics, Hypnotics, Laxatives, Sedatives, and Psychotropics 3  Patient Care Equipment 1  Mobility-Assistance 2  Mobility-Gait 2  Mobility-Sensory Deficit 2  Altered awareness of immediate physical environment 1  Impulsiveness 2  Lack of understanding of one's physical/cognitive limitations 4  Total Score 26  Patient Fall Risk Level High fall risk  Adult Fall Risk Interventions  Required Bundle Interventions *See Row Information* High fall risk - low, moderate, and high requirements implemented  Additional Interventions Use of appropriate toileting equipment (bedpan, BSC, etc.);Room near nurses station  Screening for Fall Injury Risk (To be completed on HIGH fall risk patients) - Assessing Need for Low Bed  Risk For Fall Injury- Low Bed Criteria None identified - Continue screening  Screening for Fall Injury Risk (To be completed on HIGH fall risk patients who do not meet crieteria for Low Bed) - Assessing Need for Floor Mats Only  Risk For Fall Injury- Criteria for Floor Mats Confusion/dementia (+CAM, CIWA, TBI, etc.)

## 2018-07-05 NOTE — H&P (Signed)
[place] [place] NAME: Mario Liu    MR#:  [id number]  DATE OF BIRTH:  01-19-1961  DATE OF ADMISSION:  07/05/2018  PRIMARY CARE PHYSICIAN: [name], FNP   REQUESTING/REFERRING PHYSICIAN: Dr. Kerman Passey  CHIEF COMPLAINT:   Not his usual self lethargic our staff at the group home HISTORY OF PRESENT ILLNESS:  Mario Liu  is a 57 y.o. male with a known history of *major depression, Down syndrome, COPD, history of stroke comes to the emergency room from group home after staff member found him not his usual self since this morning. Not sure if patient fell at some point however they found him urinated him on himself.  In the ER patient was brought in blood pressure systolic was in the 19F. He received IV fluids blood pressure during my evaluation was 104/78. He is more awake he is oriented to person and self.  CT had shows no acute stroke. MRI brain pending.  Caregiver in the room states he is more awake now.  Creatinine was 1.54. Clinically patient seems to be dehydrated. He is being admitted for acute renal failure secondary to dehydration.  PAST MEDICAL HISTORY:   Past Medical History:  Diagnosis Date  . COPD (chronic obstructive pulmonary disease) ([place])   . Hepatitis C   . Hx of cholecystectomy   . Hypothyroidism   . Kidney cysts   . Major depressive disorder    Was treated at facility in [place] [facility] years ago  . Stroke ([place])   . Tobacco abuse   . Vitamin D deficiency     PAST SURGICAL HISTOIRY:   Past Surgical History:  Procedure Laterality Date  . LAPAROSCOPIC CHOLECYSTECTOMY      SOCIAL HISTORY:   Social History   Tobacco Use  . Smoking status: Current Every Day Smoker    Packs/day: 0.50    Years: 39.00    Pack years: 19.50    Types: Cigarettes  . Smokeless tobacco: Never Used  . Tobacco comment: pt is still smoking  Substance Use Topics  . Alcohol use: No    FAMILY HISTORY:    Family History  Adopted: Yes  Problem Relation Age of Onset  . Diabetes Maternal Grandmother   . Prostate cancer Neg Hx   . Bladder Cancer Neg Hx   . Kidney cancer Neg Hx     DRUG ALLERGIES:  No Known Allergies  REVIEW OF SYSTEMS:  Review of Systems  Unable to perform ROS: Mental status change     MEDICATIONS AT HOME:   Prior to Admission medications   Medication Sig Start Date End Date Taking? Authorizing Provider  albuterol (PROVENTIL HFA;VENTOLIN HFA) 108 (90 Base) MCG/ACT inhaler Inhale into the lungs every 6 (six) hours as needed for wheezing or shortness of breath.   Yes [provider]  alendronate (FOSAMAX) 70 MG tablet Take 70 mg by mouth once a week. Fridays   Yes [provider]  alum & mag hydroxide-simeth (GERI-LANTA) 200-200-20 MG/5ML suspension Take 15 mLs by mouth every 6 (six) hours as needed for indigestion or heartburn.   Yes [provider]  aspirin 325 MG tablet Take 325 mg daily by mouth.   Yes [provider]  atorvastatin (LIPITOR) 40 MG tablet Take 40 mg daily by mouth.   Yes [provider]  benztropine (COGENTIN) 1 MG tablet Take 1 mg by mouth at bedtime.   Yes [provider]  buPROPion (  WELLBUTRIN XL) 150 MG 24 hr tablet Take 150 mg by mouth daily.    Yes [provider]  calcium carbonate (OS-CAL) 600 MG TABS tablet Take 600 mg by mouth 2 (two) times daily with a meal.    Yes [provider]  cholecalciferol (VITAMIN D) 1000 units tablet Take 1,000 Units by mouth daily.   Yes [provider]  citalopram (CELEXA) 40 MG tablet Take 40 mg by mouth daily.    Yes [provider]  clonazePAM (KLONOPIN) 0.5 MG tablet Take 0.5 mg by mouth daily.    Yes [provider]  clopidogrel (PLAVIX) 75 MG tablet Take 75 mg daily by mouth.   Yes [provider]  fluticasone (FLONASE) 50 MCG/ACT nasal spray Place 2 sprays into both nostrils daily.    Yes [provider]  Glycopyrrolate-Formoterol (BEVESPI AEROSPHERE) 9-4.8 MCG/ACT AERO Inhale 2 puffs into the lungs 2 (two) times daily.   Yes [provider]  loratadine (CLARITIN) 10 MG tablet Take 10 mg by mouth daily.   Yes [provider]  polyethylene glycol (MIRALAX / GLYCOLAX) packet Take 17 g by mouth daily as needed for mild constipation or moderate constipation.    Yes [provider]  risperidone (RISPERDAL) 4 MG tablet Take 4 mg by mouth at bedtime.    Yes [provider]  traZODone (DESYREL) 50 MG tablet Take 1 tablet (50 mg total) by mouth at bedtime. 03/11/18  Yes Wieting, Richard, MD      VITAL SIGNS:  Blood pressure 97/77, pulse 78, temperature 98.1 F (36.7 C), temperature source Oral, resp. rate 16, height 5\' 7"  (1.702 m), weight 75.8 kg, SpO2 98 %.  PHYSICAL EXAMINATION:  GENERAL:  57 y.o.-year-old patient lying in the bed with no acute distress.  EYES: Pupils equal, round, reactive to light and accommodation. No scleral icterus. Extraocular muscles intact.  HEENT: Head atraumatic, normocephalic. Oropharynx and nasopharynx clear. It is dry oral mucosa dry chapped lips. NECK:  Supple, no jugular venous distention. No thyroid enlargement, no tenderness.  LUNGS: Normal breath sounds bilaterally, no wheezing, rales,rhonchi or crepitation. No use of accessory muscles of respiration.  CARDIOVASCULAR: S1, S2 normal. No murmurs, rubs, or gallops.  ABDOMEN: Soft, nontender, nondistended. Bowel sounds present. No organomegaly or mass.  EXTREMITIES: No pedal edema, cyanosis, or clubbing.  NEUROLOGIC exam due to patient's mental status. Moves all extremities well. Gait not checked. No focal deficit. PSYCHIATRIC: The patient is alert and oriented person and self SKIN: No obvious rash, lesion, or ulcer.   LABORATORY PANEL:   CBC Recent Labs  Lab 07/05/18 0756  WBC 7.6  HGB 12.9*  HCT 41.2  PLT 100*    ------------------------------------------------------------------------------------------------------------------  Chemistries  Recent Labs  Lab 07/05/18 0756  NA 139  K 4.0  CL 103  CO2 25  GLUCOSE 128*  BUN 19  CREATININE 1.54*  CALCIUM 9.4  AST 30  ALT 26  ALKPHOS 46  BILITOT 1.1   ------------------------------------------------------------------------------------------------------------------  Cardiac Enzymes Recent Labs  Lab 07/05/18 0756  TROPONINI <0.03   ------------------------------------------------------------------------------------------------------------------  RADIOLOGY:  Ct Head Wo Contrast  Result Date: 07/05/2018 CLINICAL DATA:  Fall earlier today. Generalized lower extremity weakness. Confusion. EXAM: CT HEAD WITHOUT CONTRAST TECHNIQUE: Contiguous axial images were obtained from the base of the skull through the vertex without intravenous contrast. COMPARISON:  Head CT March 08, 2018 and brain MRI March 08, 2018 FINDINGS: Brain: Ventricles and sulci are stable and within normal limits for age. There is  evidence of a prior infarct involving portions of the posterior temporal lobe on the left as well as much of the left occipital lobe, stable. This prior infarct extends to the temporoparietal and parieto-occipital junction regions on the left, stable. There is no intracranial mass, hemorrhage, or extra-axial fluid collection. No acute infarct evident. No midline shift. There is slight small vessel disease in the centra semiovale bilaterally. Vascular: No evident hyperdense vessel. There is slight calcification in each carotid siphon region. Skull: The bony calvarium appears intact. Sinuses/Orbits: There is mucosal thickening and opacification in multiple ethmoid air cells bilaterally. There is opacification in portions of the right sphenoid sinus. There are retention cysts in the posterior left maxillary antrum. Orbits appear symmetric bilaterally. Other:  Mastoid air cells are clear. IMPRESSION: 1. Stable prior infarct involving a portion of the left posterior cerebral artery distribution. There is slight periventricular small vessel disease. No acute infarct evident. No mass or hemorrhage. 2. Mild arterial vascular calcification in each carotid siphon region. 3.  Multifocal paranasal sinus disease noted. Electronically Signed   By: Lowella Grip III M.D.   On: 07/05/2018 08:42   Dg Chest Portable 1 View  Result Date: 07/05/2018 CLINICAL DATA:  fall around 0400, but was found around 0600. Pt has hx of CVA 2 mos ago and has some generalized lower extremity weakness. Per EMS staff at group home report the patient is not acting normal and is more confused, history of COPD, HEP C, stroke, smoker EXAM: PORTABLE CHEST - 1 VIEW COMPARISON:  none FINDINGS: Low lung volumes with crowding of bronchovascular structures. No definite focal airspace disease or overt edema. Heart size and mediastinal contours are within normal limits. No effusion. Visualized bones unremarkable.  Cholecystectomy clips. IMPRESSION: Low volumes.  No acute disease. Electronically Signed   By: Lucrezia Europe M.D.   On: 07/05/2018 08:37    EKG:    IMPRESSION AND PLAN:   Mario Liu  is a 57 y.o. male with a known history of *major depression, Down syndrome, COPD, history of stroke comes to the emergency room from group home after staff member found him not his usual self since this morning. Not sure if patient fell at some point however they found him urinated him on himself.  1. altered mental status/lethargic suspected due to dehydration from workup so far and the emergency room -patient's baseline creatinine is around 1.2--- came in with creatinine of 1.54 -clinically appears dehydrated-- patient came in with blood pressure of 70 systolic much improved after IV fluids more awake alert now them before per caregiver in the room -patient for overnight observation -MRI brain  ordered -UA negative for UTI  2. history of major depression with down syndrome -continue psych meds  3. History of CVA -no focal deficits. MRI brain pending -continue aspirin Plavix statins  4. DVT prophylaxis subcu Lovenox    All the records are reviewed and case discussed with ED provider. Management plans discussed with the patient, family and they are in agreement.  CODE STATUS: full  TOTAL TIME TAKING CARE OF THIS PATIENT: *40* minutes.    Fritzi Mandes M.D on 07/05/2018 at 10:34 AM  Between 7am to 6pm - Pager - [phone]  After 6pm go to [email or web address] - password [email or web address] [facility]  [facility]  Office  [phone]  CC: Primary care physician; [name], FNP

## 2018-07-05 NOTE — ED Notes (Signed)
Spoke to his POA (pts sister) on the phone. She is aware of his admission and has spoken to MRI. Her number is [phone]. She would like his RN on the floor to call and help her set up a password.

## 2018-07-06 LAB — BASIC METABOLIC PANEL
Anion gap: 7 (ref 5–15)
BUN: 14 mg/dL (ref 6–20)
CALCIUM: 8.6 mg/dL — AB (ref 8.9–10.3)
CHLORIDE: 112 mmol/L — AB (ref 98–111)
CO2: 24 mmol/L (ref 22–32)
CREATININE: 0.97 mg/dL (ref 0.61–1.24)
GFR calc Af Amer: >60 mL/min (ref >60)
GFR calc non Af Amer: >60 mL/min (ref >60)
Glucose, Bld: 116 mg/dL — ABNORMAL HIGH (ref 70–99)
Potassium: 4.4 mmol/L (ref 3.5–5.1)
SODIUM: 143 mmol/L (ref 135–145)

## 2018-07-06 LAB — URINE CULTURE

## 2018-07-06 MED ORDER — HALOPERIDOL LACTATE 5 MG/ML IJ SOLN
1.0000 mg | Freq: Once | INTRAMUSCULAR | Status: AC
  Administered 2018-07-06: 1 mg via INTRAVENOUS

## 2018-07-06 MED ORDER — QUETIAPINE FUMARATE 25 MG PO TABS
25.0000 mg | ORAL_TABLET | Freq: Once | ORAL | Status: AC
  Administered 2018-07-06: 14:00:00 25 mg via ORAL
  Filled 2018-07-06: qty 1

## 2018-07-06 MED ORDER — HALOPERIDOL LACTATE 5 MG/ML IJ SOLN
1.0000 mg | Freq: Four times a day (QID) | INTRAMUSCULAR | Status: DC | PRN
  Administered 2018-07-06: 1 mg via INTRAVENOUS
  Filled 2018-07-06: qty 1

## 2018-07-06 MED ORDER — NICOTINE 21 MG/24HR TD PT24
21.0000 mg | MEDICATED_PATCH | Freq: Every day | TRANSDERMAL | Status: DC
  Administered 2018-07-06 – 2018-07-07 (×2): 21 mg via TRANSDERMAL
  Filled 2018-07-06: qty 1

## 2018-07-06 MED ORDER — HALOPERIDOL LACTATE 5 MG/ML IJ SOLN
INTRAMUSCULAR | Status: AC
  Administered 2018-07-06: 14:00:00 1 mg via INTRAVENOUS
  Filled 2018-07-06: qty 1

## 2018-07-06 MED ORDER — AMOXICILLIN-POT CLAVULANATE 875-125 MG PO TABS
1.0000 | ORAL_TABLET | Freq: Two times a day (BID) | ORAL | Status: DC
  Administered 2018-07-06 – 2018-07-07 (×3): 1 via ORAL
  Filled 2018-07-06 (×2): qty 1

## 2018-07-06 MED ORDER — SODIUM CHLORIDE 0.9 % IV BOLUS
500.0000 mL | Freq: Once | INTRAVENOUS | Status: AC
  Administered 2018-07-06: 500 mL via INTRAVENOUS

## 2018-07-06 NOTE — Care Management Note (Signed)
Case Management Note  Patient Details  Name: Mario Liu MRN: [id number] Date of Birth: 09-Apr-1961  Subjective/Objective:     Admitted to [facility] under observation status with the diagnosis of altered mental status/post fall. Lives at [facility]. Sister/ legal guardian  is [name] [phone]).  Followed by [facility] in the past.               Action/Plan: Received referral for home health needs. Will continue to follow for discharge plans   Expected Discharge Date:                  Expected Discharge Plan:     In-House Referral:   yes  Discharge planning Services   yes  Post Acute Care Choice:   yes Choice offered to:   will be offered to sister  DME Arranged:    DME Agency:     HH Arranged:    [place] Agency:     Status of Service:     If discussed at [employer], dates discussed:    Additional Comments:  Shelbie Ammons, RN MSN CCM Care Management [phone] 07/06/2018, 8:14 AM

## 2018-07-06 NOTE — Progress Notes (Signed)
Patient ID: Mario Liu, male   DOB: 1961-07-21, 57 y.o.   MRN: [id number]  [employer] PROGRESS NOTE  Mario Liu WUJ:[id number] DOB: 16-Jul-1961 DOA: 07/05/2018 PCP: [name], FNP  HPI/Subjective: Patient feels okay.  Last night patient was found sitting in the corner on the floor.  They had a market as a fall but it is unclear if he did fall.  Patient not able to give me that history.  Patient was brought in with dehydration.  Patient has become more agitated this afternoon.  Objective: Vitals:   07/05/18 2358 07/06/18 0429  BP: 104/73 119/86  Pulse: 74 70  Resp: 16 14  Temp: (!) 97.5 F (36.4 C) 97.8 F (36.6 C)  SpO2: 100% 100%   No intake or output data in the 24 hours ending 07/06/18 1412 Filed Weights   07/05/18 0738  Weight: 75.8 kg    ROS: Review of Systems  Unable to perform ROS: Mental acuity  Respiratory: Negative for cough and shortness of breath.   Gastrointestinal: Negative for abdominal pain.  Musculoskeletal: Negative for joint pain.   Exam: Physical Exam  HENT:  Nose: No mucosal edema.  Mouth/Throat: No oropharyngeal exudate or posterior oropharyngeal edema.  Eyes: Pupils are equal, round, and reactive to light. Conjunctivae, EOM and lids are normal.  Neck: No JVD present. Carotid bruit is not present. No edema present. No thyroid mass and no thyromegaly present.  Cardiovascular: S1 normal and S2 normal. Exam reveals no gallop.  No murmur heard. Pulses:      Dorsalis pedis pulses are 2+ on the right side, and 2+ on the left side.  Respiratory: No respiratory distress. He has no wheezes. He has no rhonchi. He has no rales.  GI: Soft. Bowel sounds are normal. There is no tenderness.  Musculoskeletal:       Right ankle: He exhibits no swelling.       Left ankle: He exhibits no swelling.  Lymphadenopathy:    He has no cervical adenopathy.  Neurological: He is alert. No cranial nerve deficit.  Skin: Skin is warm. No rash noted. Nails show  no clubbing.  Psychiatric: He has a normal mood and affect.      Data Reviewed: Basic Metabolic Panel: Recent Labs  Lab 07/05/18 0756 07/06/18 0621  NA 139 143  K 4.0 4.4  CL 103 112*  CO2 25 24  GLUCOSE 128* 116*  BUN 19 14  CREATININE 1.54* 0.97  CALCIUM 9.4 8.6*   Liver Function Tests: Recent Labs  Lab 07/05/18 0756  AST 30  ALT 26  ALKPHOS 46  BILITOT 1.1  PROT 7.5  ALBUMIN 3.9   CBC: Recent Labs  Lab 07/05/18 0756  WBC 7.6  NEUTROABS 5.5  HGB 12.9*  HCT 41.2  MCV 90.2  PLT 100*   Cardiac Enzymes: Recent Labs  Lab 07/05/18 0756  TROPONINI <0.03   BNP (last 3 results) Recent Labs    03/08/18 1259  BNP 15.0     CBG: Recent Labs  Lab 07/05/18 0745  GLUCAP 117*    Recent Results (from the past 240 hour(s))  Urine Culture     Status: Abnormal   Collection Time: 07/05/18  8:23 AM  Result Value Ref Range Status   Specimen Description   Final    URINE, RANDOM Performed at [facility], [address], [place], [place] [address]    Special Requests   Final    NONE Performed at [facility], [address] [place], [place] [address]    Culture (A)  Final    >=100,000 COLONIES/mL GROUP B STREP(S.AGALACTIAE)ISOLATED TESTING AGAINST S. AGALACTIAE NOT ROUTINELY PERFORMED DUE TO PREDICTABILITY OF AMP/PEN/VAN SUSCEPTIBILITY. Performed at [facility], [place] [address] [place], [place] [address]    Report Status 07/06/2018 FINAL  Final     Studies: Ct Head Wo Contrast  Addendum Date: 07/06/2018   ADDENDUM REPORT: 07/06/2018 13:39 ADDENDUM: -- Electronically Signed   By: Lowella Grip III M.D.   On: 07/06/2018 13:39   Result Date: 07/06/2018 CLINICAL DATA:  Fall earlier today. Generalized lower extremity weakness. Confusion. EXAM: CT HEAD WITHOUT CONTRAST TECHNIQUE: Contiguous axial images were obtained from the base of the skull through the vertex without intravenous contrast. COMPARISON:  Head CT March 08, 2018 and brain MRI March 08, 2018 FINDINGS: Brain: Ventricles and sulci are stable and within normal limits for age. There is evidence of a prior infarct involving portions of the posterior temporal lobe on the left as well as much of the left occipital lobe, stable. This prior infarct extends to the temporoparietal and parieto-occipital junction regions on the left, stable. There is no intracranial mass, hemorrhage, or extra-axial fluid collection. No acute infarct evident. No midline shift. There is slight small vessel disease in the centra semiovale bilaterally. Vascular: No evident hyperdense vessel. There is slight calcification in each carotid siphon region. Skull: The bony calvarium appears intact. Sinuses/Orbits: There is mucosal thickening and opacification in multiple ethmoid air cells bilaterally. There is opacification in portions of the right sphenoid sinus. There are retention cysts in the posterior left maxillary antrum. Orbits appear symmetric bilaterally. Other: Mastoid air cells are clear. IMPRESSION: 1. Stable prior infarct involving a portion of the left posterior cerebral artery distribution. There is slight periventricular small vessel disease. No acute infarct evident. No mass or hemorrhage. 2. Mild arterial vascular calcification in each carotid siphon region. 3.  Multifocal paranasal sinus disease noted. Electronically Signed: By: Lowella Grip III M.D. On: 07/05/2018 08:42   Mr Brain Wo Contrast  Result Date: 07/05/2018 CLINICAL DATA:  Down syndrome. Depression. Mental status changes today. EXAM: MRI HEAD WITHOUT CONTRAST TECHNIQUE: Multiplanar, multiecho pulse sequences of the brain and surrounding structures were obtained without intravenous contrast. COMPARISON:  CT same day.  MRI 03/08/2018 FINDINGS: Brain: Diffusion imaging does not show any acute or subacute infarction. The brainstem and cerebellum are normal. There is old left MCA branch vessel territory infarction affecting  the lateral temporal lobe and temporoparietal junction region. This has progressed to atrophy, encephalomalacia and gliosis. Minor hemosiderin deposition is noted in the region of infarction. The remainder of the brain is negative. No hydrocephalus. No extra-axial collection. Vascular: Major vessels at the base of the brain show flow. Skull and upper cervical spine: Negative Sinuses/Orbits: Clear except for scattered retention cysts. Orbits negative. Other: None IMPRESSION: No acute or reversible finding by MRI. Old infarction in a left MCA branch vessel territory affecting the lateral temporal lobe and temporoparietal junction region. Electronically Signed   By: Nelson Chimes M.D.   On: 07/05/2018 13:46   Dg Chest Portable 1 View  Result Date: 07/05/2018 CLINICAL DATA:  fall around 0400, but was found around 0600. Pt has hx of CVA 2 mos ago and has some generalized lower extremity weakness. Per EMS staff at group home report the patient is not acting normal and is more confused, history of COPD, HEP C, stroke, smoker EXAM: PORTABLE CHEST - 1 VIEW COMPARISON:  none FINDINGS: Low lung volumes with crowding of bronchovascular structures. No definite focal airspace disease or overt edema. Heart size and mediastinal contours are within normal limits. No effusion. Visualized bones unremarkable.  Cholecystectomy clips. IMPRESSION: Low volumes.  No acute disease. Electronically Signed   By: Lucrezia Europe M.D.   On: 07/05/2018 08:37    Scheduled Meds: . amoxicillin-clavulanate  1 tablet Oral Q12H  . aspirin EC  325 mg Oral Daily  . atorvastatin  40 mg Oral Daily  . benztropine  1 mg Oral QHS  . buPROPion  150 mg Oral Daily  . calcium carbonate  600 mg of elemental calcium Oral BID WC  . cholecalciferol  1,000 Units Oral Daily  . citalopram  40 mg Oral Daily  . clonazePAM  0.5 mg Oral Daily  . clopidogrel  75 mg Oral Daily  . enoxaparin (LOVENOX) injection  40 mg Subcutaneous Q24H  . fluticasone  2 spray Each  Nare Daily  . loratadine  10 mg Oral Daily  . nicotine  21 mg Transdermal Daily  . QUEtiapine  25 mg Oral Once   Continuous Infusions: . sodium chloride 500 mL (07/06/18 1405)    Assessment/Plan:  1. Acute kidney injury, dehydration, orthostatic hypotension.  Give another fluid bolus today.  Kidney function has come back to normal. 2. Acute cystitis.  This morning urine culture was unidentified organism which usually means a gram-positive organism.  This afternoon has come back strep agalactiae.  Patient was placed on Augmentin. 3. Agitation.  Given Haldol as needed and will give a dose of Seroquel currently. 4. History of CVA on aspirin and Plavix and statin.  Walked around well with physical therapy earlier before agitation.  MRI of the brain negative. 5. Hopefully can go back to group home tomorrow.  Code Status:     Code Status Orders  (From admission, onward)         Start     Ordered   07/05/18 1245  Full code  Continuous     07/05/18 1244        Code Status History    Date Active Date Inactive Code Status Order ID Comments User Context   03/09/2018 0410 03/11/2018 1627 Full Code [id number]  Arta Silence, MD Inpatient   03/01/2018 2228 03/03/2018 1724 Full Code [id number]  Dustin Flock, MD Inpatient   05/18/2017 1552 05/26/2017 1637 Full Code [id number]  Flora Lipps Inpatient   05/18/2017 1552 05/18/2017 1552 Full Code [id number]  Flora Lipps Inpatient   05/14/2017 1847 05/18/2017 1542 Full Code [id number]  Maryellen Pile, MD Inpatient     Family Communication: Left message for sister Disposition Plan: Back to group home tomorrow  Antibiotics:  Augmentin  Time spent: 28 minutes  [place]

## 2018-07-06 NOTE — Evaluation (Signed)
Physical Therapy Evaluation Patient Details Name: Mario Liu MRN: [id number] DOB: [birth year] Today's Date: 07/06/2018   History of Present Illness  57 y.o. male with a known history of depression, Down's syndrome, COPD, history of stroke comes to the emergency room from group home after staff member found him not his usual self.  Clinical Impression  Pt with some initial impulsivity that did cause some unsteadiness, but overall this appears to be close to his baseline (similar presentation as when this PT saw him ~4 months ago).  Today he was able to circumambulate the nurses' station X 3 w/o fatigue, or overt safety issues apart from PT needing to give nearly constant cues for directions/awareness/staying on task.  Pt should be able to return to his group home, but does need cuing secondary to baseline mental status.    Follow Up Recommendations No PT follow up    Equipment Recommendations       Recommendations for Other Services       Precautions / Restrictions Precautions Precautions: Fall Restrictions Weight Bearing Restrictions: No      Mobility  Bed Mobility Overal bed mobility: Modified Independent             General bed mobility comments: Pt able to get up to sitting at EOB w/o direct assist  Transfers Overall transfer level: Modified independent Equipment used: None             General transfer comment: Pt impulsive in getting to standing, reaching for sink to pull up, able to rise w/o direct assist but showed some unsteadiness  Ambulation/Gait Ambulation/Gait assistance: Min guard Gait Distance (Feet): 500 Feet Assistive device: None       General Gait Details: Pt with some initial impulsive unsteadiness, but ultimately was able to circumambulate the nurses' station 3x w/o issue.  He needed constant directional cuing to stay on task, but phyiscally had no issues.  He again did show some impulsivity and poor decision-making but overall appears to be  near his baseline  Financial trader Rankin (Stroke Patients Only)       Balance Overall balance assessment: Mild deficits observed, not formally tested(limitaions seem more related to awareness/mental status )                                           Pertinent Vitals/Pain Pain Assessment: No/denies pain    Home Living Family/patient expects to be discharged to:: Group home                 Additional Comments: difficult to obtain accurate history from pt due to communication difficulties. Pt states that he lives in a group home however unclear as to home layout and amount of assistance available.    Prior Function           Comments: amb multiple loops around nurses station with PT when he was here ~ 4 months ago     Hand Dominance   Dominant Hand: Left    Extremity/Trunk Assessment   Upper Extremity Assessment Upper Extremity Assessment: Overall WFL for tasks assessed;Difficult to assess due to impaired cognition    Lower Extremity Assessment Lower Extremity Assessment: Overall WFL for tasks assessed;Difficult to assess due to impaired cognition       Communication   Communication: Expressive  difficulties  Cognition Arousal/Alertness: Awake/alert Behavior During Therapy: Impulsive Overall Cognitive Status: History of cognitive impairments - at baseline                                 General Comments: seems near baseline, similar demeanor per this PT's last assessment in June      General Comments      Exercises     Assessment/Plan    PT Assessment Patent does not need any further PT services  PT Problem List         PT Treatment Interventions      PT Goals (Current goals can be found in the Care Plan section)  Acute Rehab PT Goals Patient Stated Goal: go outside PT Goal Formulation: All assessment and education complete, DC therapy    Frequency     Barriers  to discharge        Co-evaluation               AM-PAC PT "6 Clicks" Daily Activity  Outcome Measure Difficulty turning over in bed (including adjusting bedclothes, sheets and blankets)?: None Difficulty moving from lying on back to sitting on the side of the bed? : None Difficulty sitting down on and standing up from a chair with arms (e.g., wheelchair, bedside commode, etc,.)?: A Little Help needed moving to and from a bed to chair (including a wheelchair)?: None Help needed walking in hospital room?: None Help needed climbing 3-5 steps with a railing? : None 6 Click Score: 23    End of Session Equipment Utilized During Treatment: Gait belt Activity Tolerance: Patient tolerated treatment well Patient left: with chair alarm set;with call bell/phone within reach Nurse Communication: Mobility status PT Visit Diagnosis: History of falling (Z91.81)    Time: 1130-1150 PT Time Calculation (min) (ACUTE ONLY): 20 min   Charges:   PT Evaluation $PT Eval Low Complexity: 1 Low          Kreg Shropshire, DPT 07/06/2018, 12:32 PM

## 2018-07-06 NOTE — Care Management Obs Status (Signed)
[facility] NOTIFICATION   Patient Details  Name: Mario Liu MRN: [id number] Date of Birth: 02-05-1961   Medicare Observation Status Notification Given:  Yes: Explained to sister/legal guardian Mario Barrios, RN 07/06/2018, 8:20 AM

## 2018-07-06 NOTE — NC FL2 (Signed)
[place] LEVEL OF CARE SCREENING TOOL     IDENTIFICATION  Patient Name: Mario Liu Birthdate: 1961/06/24 Sex: male Admission Date (Current Location): 07/05/2018  [place] and [place] Number:  Engineering geologist and Address:  [facility], [address], ,  [address]      Provider Number: [id number]  Attending Physician Name and Address:  Loletha Grayer, MD  Relative Name and Phone Number:  [name]- guardian [phone]    Current Level of Care: Hospital Recommended Level of Care: Other (Comment)(Group Home ) Prior Approval Number:    Date Approved/Denied:   PASRR Number:    Discharge Plan: Other (Comment)(Group Home )    Current Diagnoses: Patient Active Problem List   Diagnosis Date Noted  . Altered mental status 03/09/2018  . Recurrent falls 03/01/2018  . Seizure disorder ([place]) 11/24/2017  . Major depressive disorder   . Hepatitis C antibody test positive 05/16/2017  . Acute ischemic left MCA stroke ([place])   . Down's syndrome   . PFO with atrial septal aneurysm     Orientation RESPIRATION BLADDER Height & Weight     Self, Time, Place  Normal Continent Weight: 167 lb 1.6 oz (75.8 kg) Height:  5\' 7"  (170.2 cm)  BEHAVIORAL SYMPTOMS/MOOD NEUROLOGICAL BOWEL NUTRITION STATUS  (none) (none) Continent Diet(soft diet )  AMBULATORY STATUS COMMUNICATION OF NEEDS Skin   Limited Assist Verbally Normal                       Personal Care Assistance Level of Assistance  Bathing, Feeding, Dressing Bathing Assistance: Limited assistance Feeding assistance: Independent Dressing Assistance: Independent     Functional Limitations Info  Sight, Hearing, Speech Sight Info: Adequate Hearing Info: Adequate Speech Info: Adequate    SPECIAL CARE FACTORS FREQUENCY                       Contractures Contractures Info: Not present    Additional Factors Info  Code Status, Allergies Code Status  Info: Full Code  Allergies Info: NKA           Current Medications (07/06/2018):  This is the current hospital active medication list Current Facility-Administered Medications  Medication Dose Route Frequency Provider Last Rate Last Dose  . acetaminophen (TYLENOL) tablet 650 mg  650 mg Oral Q6H PRN Fritzi Mandes, MD       Or  . acetaminophen (TYLENOL) suppository 650 mg  650 mg Rectal Q6H PRN Fritzi Mandes, MD      . alum & mag hydroxide-simeth (MAALOX/MYLANTA) 200-200-20 MG/5ML suspension 15 mL  15 mL Oral Q6H PRN Fritzi Mandes, MD      . aspirin EC tablet 325 mg  325 mg Oral Daily Fritzi Mandes, MD   325 mg at 07/06/18 1037  . atorvastatin (LIPITOR) tablet 40 mg  40 mg Oral Daily Fritzi Mandes, MD   40 mg at 07/06/18 1037  . benztropine (COGENTIN) tablet 1 mg  1 mg Oral QHS Fritzi Mandes, MD   1 mg at 07/05/18 1732  . buPROPion (WELLBUTRIN XL) 24 hr tablet 150 mg  150 mg Oral Daily Fritzi Mandes, MD   150 mg at 07/06/18 1040  . calcium carbonate (TUMS - dosed in mg elemental calcium) chewable tablet 600 mg of elemental calcium  600 mg of elemental calcium Oral BID WC Fritzi Mandes, MD   600 mg of elemental calcium at 07/06/18 1037  . cholecalciferol (VITAMIN D) tablet  1,000 Units  1,000 Units Oral Daily Fritzi Mandes, MD   1,000 Units at 07/06/18 1037  . citalopram (CELEXA) tablet 40 mg  40 mg Oral Daily Fritzi Mandes, MD   40 mg at 07/06/18 1037  . clonazePAM (KLONOPIN) tablet 0.5 mg  0.5 mg Oral Daily Fritzi Mandes, MD   0.5 mg at 07/06/18 1037  . clopidogrel (PLAVIX) tablet 75 mg  75 mg Oral Daily Fritzi Mandes, MD   75 mg at 07/06/18 1037  . enoxaparin (LOVENOX) injection 40 mg  40 mg Subcutaneous Q24H Fritzi Mandes, MD   40 mg at 07/05/18 2233  . fluticasone (FLONASE) 50 MCG/ACT nasal spray 2 spray  2 spray Each Nare Daily Fritzi Mandes, MD   2 spray at 07/05/18 1732  . loratadine (CLARITIN) tablet 10 mg  10 mg Oral Daily Fritzi Mandes, MD   10 mg at 07/06/18 1037  . ondansetron (ZOFRAN) tablet 4 mg  4 mg Oral  Q6H PRN Fritzi Mandes, MD       Or  . ondansetron [facility]) injection 4 mg  4 mg Intravenous Q6H PRN Fritzi Mandes, MD      . polyethylene glycol (MIRALAX / GLYCOLAX) packet 17 g  17 g Oral Daily PRN Fritzi Mandes, MD         Discharge Medications: Please see discharge summary for a list of discharge medications.  Relevant Imaging Results:  Relevant Lab Results:   Additional Information    Teosha Casso  Louretta Shorten, LCSWA

## 2018-07-06 NOTE — Clinical Social Work Note (Signed)
Clinical Social Work Assessment  Patient Details  Name: Mario Liu MRN: [id number] Date of Birth: October 24, 1960  Date of referral:  07/06/18               Reason for consult:  Facility Placement                Permission sought to share information with:  Case Manager, Customer service manager, Family Supports Permission granted to share information::  Yes, Verbal Permission Granted  Name::        Agency::     Relationship::     Contact Information:     Housing/Transportation Living arrangements for the past 2 months:  Group Home Source of Information:  Patient, Guardian Patient Interpreter Needed:  None Criminal Activity/Legal Involvement Pertinent to Current Situation/Hospitalization:  No - Comment as needed Significant Relationships:  Siblings, Mario Liu Lives with:  Facility Resident Do you feel safe going back to the place where you live?  Yes Need for family participation in patient care:  Yes (Comment)  Care giving concerns:  Patient lives at [facility].    Social Worker assessment / plan:  CSW consulted for facility placement. CSW spoke with patient and he is confused but able to tell CSW that he lives at [facility] spoke with patient's guardian Mario Liu [phone] and she confirms that patient is from group home and should return there is physically able. CSW will continue to follow for discharge planning.   Employment status:  Disabled (Comment on whether or not currently receiving Disability) Insurance information:  Medicare PT Recommendations:  Home with Liu / Referral to community resources:     Patient/Family's Response to care:  Patient and guardian thanked CSW for assistance   Patient/Family's Understanding of and Emotional Response to Diagnosis, Current Treatment, and Prognosis:  Guardian understands current treatment   Emotional Assessment Appearance:  Appears stated age Attitude/Demeanor/Rapport:     Affect (typically observed):  Accepting, Pleasant, Restless Orientation:  Oriented to Self, Oriented to Place Alcohol / Substance use:  Not Applicable Psych involvement (Current and /or in the community):  No (Comment)  Discharge Needs  Concerns to be addressed:  Discharge Planning Concerns Readmission within the last 30 days:  No Current discharge risk:  None Barriers to Discharge:  Continued Medical Work up   Mario Liu, Mario Liu 07/06/2018, 12:07 PM

## 2018-07-07 DIAGNOSIS — F17210 Nicotine dependence, cigarettes, uncomplicated: Secondary | ICD-10-CM | POA: Diagnosis present

## 2018-07-07 DIAGNOSIS — Z8673 Personal history of transient ischemic attack (TIA), and cerebral infarction without residual deficits: Secondary | ICD-10-CM | POA: Diagnosis not present

## 2018-07-07 DIAGNOSIS — G40909 Epilepsy, unspecified, not intractable, without status epilepticus: Secondary | ICD-10-CM | POA: Diagnosis present

## 2018-07-07 DIAGNOSIS — E559 Vitamin D deficiency, unspecified: Secondary | ICD-10-CM | POA: Diagnosis present

## 2018-07-07 DIAGNOSIS — Z79899 Other long term (current) drug therapy: Secondary | ICD-10-CM | POA: Diagnosis not present

## 2018-07-07 DIAGNOSIS — Z9049 Acquired absence of other specified parts of digestive tract: Secondary | ICD-10-CM | POA: Diagnosis not present

## 2018-07-07 DIAGNOSIS — Z833 Family history of diabetes mellitus: Secondary | ICD-10-CM | POA: Diagnosis not present

## 2018-07-07 DIAGNOSIS — N179 Acute kidney failure, unspecified: Secondary | ICD-10-CM | POA: Diagnosis present

## 2018-07-07 DIAGNOSIS — E860 Dehydration: Secondary | ICD-10-CM | POA: Diagnosis present

## 2018-07-07 DIAGNOSIS — Q909 Down syndrome, unspecified: Secondary | ICD-10-CM | POA: Diagnosis not present

## 2018-07-07 DIAGNOSIS — R4182 Altered mental status, unspecified: Secondary | ICD-10-CM | POA: Diagnosis present

## 2018-07-07 DIAGNOSIS — Z7951 Long term (current) use of inhaled steroids: Secondary | ICD-10-CM | POA: Diagnosis not present

## 2018-07-07 DIAGNOSIS — F329 Major depressive disorder, single episode, unspecified: Secondary | ICD-10-CM | POA: Diagnosis present

## 2018-07-07 DIAGNOSIS — Z7902 Long term (current) use of antithrombotics/antiplatelets: Secondary | ICD-10-CM | POA: Diagnosis not present

## 2018-07-07 DIAGNOSIS — I951 Orthostatic hypotension: Secondary | ICD-10-CM | POA: Diagnosis present

## 2018-07-07 DIAGNOSIS — J449 Chronic obstructive pulmonary disease, unspecified: Secondary | ICD-10-CM | POA: Diagnosis present

## 2018-07-07 DIAGNOSIS — Z7982 Long term (current) use of aspirin: Secondary | ICD-10-CM | POA: Diagnosis not present

## 2018-07-07 DIAGNOSIS — R531 Weakness: Secondary | ICD-10-CM | POA: Diagnosis present

## 2018-07-07 DIAGNOSIS — N3000 Acute cystitis without hematuria: Secondary | ICD-10-CM | POA: Diagnosis present

## 2018-07-07 MED ORDER — AMOXICILLIN-POT CLAVULANATE 875-125 MG PO TABS: 1.0000 | ORAL_TABLET | Freq: Two times a day (BID) | ORAL | 0 refills | Status: DC

## 2018-07-07 NOTE — Plan of Care (Signed)

## 2018-07-07 NOTE — Progress Notes (Signed)
Spoke to [employer] about pnt status. Updated [name] that pnt had severe agitation throughout dayshift requiring staff to need a sitter at bedside. Informed [name] that pnt was pinching and swinging at staff however they were able to calm him down with reassurance and addition of haldol and seroquel. [name] said she did not want pnt going home on Haldol because she feels he "doesn't need it". Explained that per report from day nurse the combination of meds was the only  Intervention that worked for pnt but that I would communicate to MD. [name] did say she was OK with him taking it here but feels that everytime he comes to the hospital he is put on additional meds. Assured family that we have been monitoring very closely so pnt is not sedated and only given whats absolutely necessary so pnt is safe. With the addition of the sitter at bedside, we can be sure pnt is not getting up out of bed without staff to [place] requests to have MD call her in AM prior to discharge.

## 2018-07-07 NOTE — Progress Notes (Signed)
Pt has been discharged back to group home.  AVS given and explained to Rochele Raring, pt's caregiver. Verbalized understanding.  Meds and f/u reviewed.

## 2018-07-07 NOTE — Clinical Social Work Note (Signed)
Patient is medically ready for discharge today. CSW contacted patient's guardian Mario Liu [phone]. Mario Liu is in agreement with discharge today. Per Mario Liu she has spoken with the group home and they plan to pick up patient today. CSW attempted to contact group home director Janetta Hora [phone] but had to leave a voicemail. CSW prepared discharge packet for group home to pick up with the patient.   [place], [place]

## 2018-07-07 NOTE — Discharge Instructions (Signed)

## 2018-07-07 NOTE — Discharge Summary (Signed)
[place] [facility] [place] NAME: Mario Liu    MR#:  [id number]  DATE OF BIRTH:  September 05, 1961  DATE OF ADMISSION:  07/05/2018   ADMITTING PHYSICIAN: Fritzi Mandes, MD  DATE OF DISCHARGE: 07/07/2018  PRIMARY CARE PHYSICIAN: [name], FNP   ADMISSION DIAGNOSIS:  Weakness [R53.1] Seizure disorder ([place]) [G40.909] DISCHARGE DIAGNOSIS:  Active Problems:   Altered mental status   AMS (altered mental status)  SECONDARY DIAGNOSIS:   Past Medical History:  Diagnosis Date  . COPD (chronic obstructive pulmonary disease) ([place])   . Hepatitis C   . Hx of cholecystectomy   . Hypothyroidism   . Kidney cysts   . Major depressive disorder    Was treated at facility in [place] ([facility]) years ago  . Stroke ([place])   . Tobacco abuse   . Vitamin D deficiency    HOSPITAL COURSE:   1. Acute kidney injury, dehydration, orthostatic hypotension: resolved. Kidney function has come back to normal. 2. Acute cystitis. Urine culture growing strep agalactiae.  Patient will be discharged on Augmentin. 3. Agitation: resolved 4. History of CVA on aspirin and Plavix and statin.  Walked around well with physical therapy earlier before agitation.  MRI of the brain negative.  DISCHARGE CONDITIONS:  stable CONSULTS OBTAINED:   DRUG ALLERGIES:  No Known Allergies DISCHARGE MEDICATIONS:   Allergies as of 07/07/2018   No Known Allergies     Medication List    TAKE these medications   albuterol 108 (90 Base) MCG/ACT inhaler Commonly known as:  PROVENTIL HFA;VENTOLIN HFA Inhale into the lungs every 6 (six) hours as needed for wheezing or shortness of breath.   alendronate 70 MG tablet Commonly known as:  FOSAMAX Take 70 mg by mouth once a week. Fridays   amoxicillin-clavulanate 875-125 MG tablet Commonly known as:  AUGMENTIN Take 1 tablet by mouth every 12 (twelve) hours.   aspirin 325 MG tablet Take 325 mg daily by mouth.   atorvastatin 40 MG  tablet Commonly known as:  LIPITOR Take 40 mg daily by mouth.   benztropine 1 MG tablet Commonly known as:  COGENTIN Take 1 mg by mouth at bedtime.   BEVESPI AEROSPHERE 9-4.8 MCG/ACT Aero Generic drug:  Glycopyrrolate-Formoterol Inhale 2 puffs into the lungs 2 (two) times daily.   buPROPion 150 MG 24 hr tablet Commonly known as:  WELLBUTRIN XL Take 150 mg by mouth daily.   calcium carbonate 600 MG Tabs tablet Commonly known as:  OS-CAL Take 600 mg by mouth 2 (two) times daily with a meal.   cholecalciferol 1000 units tablet Commonly known as:  VITAMIN D Take 1,000 Units by mouth daily.   citalopram 40 MG tablet Commonly known as:  CELEXA Take 40 mg by mouth daily.   clonazePAM 0.5 MG tablet Commonly known as:  KLONOPIN Take 0.5 mg by mouth daily.   clopidogrel 75 MG tablet Commonly known as:  PLAVIX Take 75 mg daily by mouth.   fluticasone 50 MCG/ACT nasal spray Commonly known as:  FLONASE Place 2 sprays into both nostrils daily.   GERI-LANTA 200-200-20 MG/5ML suspension Generic drug:  alum & mag hydroxide-simeth Take 15 mLs by mouth every 6 (six) hours as needed for indigestion or heartburn.   loratadine 10 MG tablet Commonly known as:  CLARITIN Take 10 mg by mouth daily.   polyethylene glycol packet Commonly known as:  MIRALAX / GLYCOLAX Take 17 g by mouth daily as needed for mild  constipation or moderate constipation.   risperidone 4 MG tablet Commonly known as:  RISPERDAL Take 4 mg by mouth at bedtime.   traZODone 50 MG tablet Commonly known as:  DESYREL Take 1 tablet (50 mg total) by mouth at bedtime.      DISCHARGE INSTRUCTIONS:   DIET:  Regular diet DISCHARGE CONDITION:  Stable ACTIVITY:  Activity as tolerated OXYGEN:  Home Oxygen: No.  Oxygen Delivery: room air DISCHARGE LOCATION:  group home   If you experience worsening of your admission symptoms, develop shortness of breath, life threatening emergency, suicidal or homicidal  thoughts you must seek medical attention immediately by calling 911 or calling your MD immediately  if symptoms less severe.  You Must read complete instructions/literature along with all the possible adverse reactions/side effects for all the Medicines you take and that have been prescribed to you. Take any new Medicines after you have completely understood and accpet all the possible adverse reactions/side effects.   Please note  You were cared for by a hospitalist during your hospital stay. If you have any questions about your discharge medications or the care you received while you were in the hospital after you are discharged, you can call the unit and asked to speak with the hospitalist on call if the hospitalist that took care of you is not available. Once you are discharged, your primary care physician will handle any further medical issues. Please note that NO REFILLS for any discharge medications will be authorized once you are discharged, as it is imperative that you return to your primary care physician (or establish a relationship with a primary care physician if you do not have one) for your aftercare needs so that they can reassess your need for medications and monitor your lab values.    On the day of Discharge:  VITAL SIGNS:  Blood pressure 108/84, pulse 83, temperature (!) 97.5 F (36.4 C), temperature source Oral, resp. rate 18, height 5\' 7"  (1.702 m), weight 75.8 kg, SpO2 96 %. PHYSICAL EXAMINATION:  GENERAL:  57 y.o.-year-old patient lying in the bed with no acute distress.  EYES: Pupils equal, round, reactive to light and accommodation. No scleral icterus. Extraocular muscles intact.  HEENT: Head atraumatic, normocephalic. Oropharynx and nasopharynx clear.  NECK:  Supple, no jugular venous distention. No thyroid enlargement, no tenderness.  LUNGS: Normal breath sounds bilaterally, no wheezing, rales,rhonchi or crepitation. No use of accessory muscles of respiration.    CARDIOVASCULAR: S1, S2 normal. No murmurs, rubs, or gallops.  ABDOMEN: Soft, non-tender, non-distended. Bowel sounds present. No organomegaly or mass.  EXTREMITIES: No pedal edema, cyanosis, or clubbing.  NEUROLOGIC: Cranial nerves II through XII are intact. Muscle strength 5/5 in all extremities. Sensation intact. Gait not checked.  PSYCHIATRIC: The patient is alert and oriented x 3.  SKIN: No obvious rash, lesion, or ulcer.  DATA REVIEW:   CBC Recent Labs  Lab 07/05/18 0756  WBC 7.6  HGB 12.9*  HCT 41.2  PLT 100*    Chemistries  Recent Labs  Lab 07/05/18 0756 07/06/18 0621  NA 139 143  K 4.0 4.4  CL 103 112*  CO2 25 24  GLUCOSE 128* 116*  BUN 19 14  CREATININE 1.54* 0.97  CALCIUM 9.4 8.6*  AST 30  --   ALT 26  --   ALKPHOS 46  --   BILITOT 1.1  --      Microbiology Results  Results for orders placed or performed during the hospital encounter of 07/05/18  Urine Culture     Status: Abnormal   Collection Time: 07/05/18  8:23 AM  Result Value Ref Range Status   Specimen Description   Final    URINE, RANDOM Performed at [facility], [address], [place], [place] [address]    Special Requests   Final    NONE Performed at [facility], [address] [place], [place] [address]    Culture (A)  Final    >=100,000 COLONIES/mL GROUP B STREP(S.AGALACTIAE)ISOLATED TESTING AGAINST S. AGALACTIAE NOT ROUTINELY PERFORMED DUE TO PREDICTABILITY OF AMP/PEN/VAN SUSCEPTIBILITY. Performed at [facility], [place] [address] [place], [place] [address]    Report Status 07/06/2018 FINAL  Final    RADIOLOGY:  No results found.   Management plans discussed with the patient, family and they are in agreement.  CODE STATUS: Full Code   TOTAL TIME TAKING CARE OF THIS PATIENT: 45 minutes.    Max Sane M.D on 07/07/2018 at 11:28 AM  Between 7am to 6pm - Pager - [phone]  After 6pm go to [email or web address] - Proofreader  [employer]  [facility]  Office  [phone]  CC: Primary care physician; [name], FNP   Note: This dictation was prepared with Dragon dictation along with smaller phrase technology. Any transcriptional errors that result from this process are unintentional.

## 2018-08-03 ENCOUNTER — Inpatient Hospital Stay: Payer: Medicare Other | Attending: Oncology | Admitting: Oncology

## 2018-08-03 ENCOUNTER — Inpatient Hospital Stay: Payer: Medicare Other | Admitting: Oncology

## 2018-08-03 ENCOUNTER — Encounter: Payer: Self-pay | Admitting: Oncology

## 2018-08-03 ENCOUNTER — Inpatient Hospital Stay: Payer: Medicare Other

## 2018-08-03 ENCOUNTER — Other Ambulatory Visit: Payer: Self-pay

## 2018-08-03 VITALS — BP 115/83 | HR 77 | Resp 18 | Wt 177.1 lb

## 2018-08-03 DIAGNOSIS — F329 Major depressive disorder, single episode, unspecified: Secondary | ICD-10-CM

## 2018-08-03 DIAGNOSIS — Q909 Down syndrome, unspecified: Secondary | ICD-10-CM | POA: Insufficient documentation

## 2018-08-03 DIAGNOSIS — Z8673 Personal history of transient ischemic attack (TIA), and cerebral infarction without residual deficits: Secondary | ICD-10-CM | POA: Insufficient documentation

## 2018-08-03 DIAGNOSIS — E039 Hypothyroidism, unspecified: Secondary | ICD-10-CM | POA: Diagnosis not present

## 2018-08-03 DIAGNOSIS — Z7982 Long term (current) use of aspirin: Secondary | ICD-10-CM | POA: Diagnosis not present

## 2018-08-03 DIAGNOSIS — D696 Thrombocytopenia, unspecified: Secondary | ICD-10-CM

## 2018-08-03 DIAGNOSIS — F17210 Nicotine dependence, cigarettes, uncomplicated: Secondary | ICD-10-CM | POA: Insufficient documentation

## 2018-08-03 DIAGNOSIS — B1920 Unspecified viral hepatitis C without hepatic coma: Secondary | ICD-10-CM | POA: Diagnosis not present

## 2018-08-03 DIAGNOSIS — Z79899 Other long term (current) drug therapy: Secondary | ICD-10-CM | POA: Insufficient documentation

## 2018-08-03 DIAGNOSIS — J449 Chronic obstructive pulmonary disease, unspecified: Secondary | ICD-10-CM | POA: Diagnosis not present

## 2018-08-03 DIAGNOSIS — E559 Vitamin D deficiency, unspecified: Secondary | ICD-10-CM | POA: Diagnosis not present

## 2018-08-03 LAB — CBC WITH DIFFERENTIAL/PLATELET
ABS IMMATURE GRANULOCYTES: 0 10*3/uL (ref 0.00–0.07)
BASOS ABS: 0 10*3/uL (ref 0.0–0.1)
Basophils Relative: 1 %
EOS PCT: 7 %
Eosinophils Absolute: 0.4 10*3/uL (ref 0.0–0.5)
HEMATOCRIT: 41.6 % (ref 39.0–52.0)
HEMOGLOBIN: 12.9 g/dL — AB (ref 13.0–17.0)
Immature Granulocytes: 0 %
LYMPHS PCT: 46 %
Lymphs Abs: 2.3 10*3/uL (ref 0.7–4.0)
MCH: 28 pg (ref 26.0–34.0)
MCHC: 31 g/dL (ref 30.0–36.0)
MCV: 90.2 fL (ref 80.0–100.0)
MONO ABS: 0.4 10*3/uL (ref 0.1–1.0)
Monocytes Relative: 9 %
NEUTROS ABS: 1.8 10*3/uL (ref 1.7–7.7)
NRBC: 0 % (ref 0.0–0.2)
Neutrophils Relative %: 37 %
Platelets: 106 10*3/uL — ABNORMAL LOW (ref 150–400)
RBC: 4.61 MIL/uL (ref 4.22–5.81)
RDW: 12.5 % (ref 11.5–15.5)
WBC: 4.8 10*3/uL (ref 4.0–10.5)

## 2018-08-03 LAB — COMPREHENSIVE METABOLIC PANEL
ALT: 39 U/L (ref 0–44)
AST: 35 U/L (ref 15–41)
Albumin: 3.8 g/dL (ref 3.5–5.0)
Alkaline Phosphatase: 60 U/L (ref 38–126)
Anion gap: 8 (ref 5–15)
BUN: 20 mg/dL (ref 6–20)
CHLORIDE: 108 mmol/L (ref 98–111)
CO2: 26 mmol/L (ref 22–32)
CREATININE: 0.98 mg/dL (ref 0.61–1.24)
Calcium: 9.3 mg/dL (ref 8.9–10.3)
GFR calc Af Amer: >60 mL/min (ref >60)
Glucose, Bld: 114 mg/dL — ABNORMAL HIGH (ref 70–99)
Potassium: 4.1 mmol/L (ref 3.5–5.1)
SODIUM: 142 mmol/L (ref 135–145)
Total Bilirubin: 0.6 mg/dL (ref 0.3–1.2)
Total Protein: 7.3 g/dL (ref 6.5–8.1)

## 2018-08-03 NOTE — Progress Notes (Signed)
Patient here for follow up. Lives at [facility]. Caregiver present during assessment. Med rec done thru facility MAR.

## 2018-08-04 NOTE — Progress Notes (Signed)
[facility] follow up Visit:  Patient Care Team: [name], FNP as PCP - General (Family Medicine)  CHIEF COMPLAINTS/PURPOSE OF CONSULTATION: Low platelets  HISTORY OF PRESENTING ILLNESS: Mario Liu 57 y.o. male is here for evaluation of low platelet count. He has Down syndrome.  The patient lives in a group home and is accompanied by caregiver from group home. He is referred to [place] for evaluation of thrombocytopenia. He had labs on with primary care provider on December 15 2016 which showed WBC 5.3 hemoglobin 14.3 MCV 88 RDW 13.8 MPV 11.37 platelet 111 lymphocyte 46.69% absolute lymphocytes 2.5 normal is BMPnormal LFT. It was noted that hepatitis C antibody reactive. Patient was seen by [facility] group for workupof her positive hepatitis C antibody Further tests including HCV RNA by PCR was negative.  Hep B core antibody negative, Hb surface antibody nonreactive,A antibody positive,Hep A IgM negative. So that patient did not really have hepatitis C, additional ultrasound and fibrocystic was canceled by GI.Marland Kitchen Patient reports feeling well, good energy level, good appetite, denies any weight loss fatigue, fever or chills. Patient is a poor historian. He told me that he used to drink liquor but haven't drink lately. In September 2018, patient had a stroke, acute ischemic left MCA stroke. His in getting physical therapy. Denies any easy bruising or bleeding. He appears to be less duct if compared to prior to the stroke. Able to have simple conversation with me.Denies any recent infection, bleeding, surgery. He has some dysphagia and speech intelligibility which slowly improving with the physical therapy and speech therapy according to the group home staff.  INTERVAL HISTORY Patient presents to clinic for all follow-up of thrombocytopenia.  Accompanied by staff in group home. Reports doing well.  Denies any easy bruising stools acute bleeding.  Appetite is good.  Denies any  dysuria, frequency. Patient is poor historian due to Down syndrome.  Patient was recently admitted from 07/05/2018 to 07/07/2018 due to after the mental status. He had acute cystitis urine culture grow strep  agalactiae.  Patient was treated with IV antibiotics and switched to oral Augmentin. Acute kidney injury/dehydration resolved after hydration.   Review of Systems  Unable to perform ROS: Other  Constitutional: Negative for appetite change, chills, fatigue and fever.  HENT:   Negative for hearing loss and voice change.   Eyes: Negative for eye problems.  Respiratory: Negative for chest tightness and hemoptysis.   Cardiovascular: Negative for chest pain.  Gastrointestinal: Negative for abdominal distention and abdominal pain.  Genitourinary: Negative for difficulty urinating.   Musculoskeletal: Negative for arthralgias.  Skin: Negative for itching.  Neurological: Negative for seizures and speech difficulty.  Hematological: Negative for adenopathy.  Psychiatric/Behavioral: Negative for confusion.    MEDICAL HISTORY: Past Medical History:  Diagnosis Date  . COPD (chronic obstructive pulmonary disease) ([place])   . Hepatitis C   . Hx of cholecystectomy   . Hypothyroidism   . Kidney cysts   . Major depressive disorder    Was treated at facility in [place] ([facility]) years ago  . Stroke ([place])   . Tobacco abuse   . Vitamin D deficiency     SURGICAL HISTORY: Past Surgical History:  Procedure Laterality Date  . LAPAROSCOPIC CHOLECYSTECTOMY      SOCIAL HISTORY: Social History   Socioeconomic History  . Marital status: Single    Spouse name: Not on file  . Number of children: Not on file  . Years of education:  Not on file  . Highest education level: Not on file  Occupational History  . Not on file  Social Needs  . Financial resource strain: Not on file  . Food insecurity:    Worry: Not on file    Inability: Not on file  . Transportation needs:    Medical: Not on  file    Non-medical: Not on file  Tobacco Use  . Smoking status: Current Every Day Smoker    Packs/day: 0.50    Years: 39.00    Pack years: 19.50    Types: Cigarettes  . Smokeless tobacco: Never Used  . Tobacco comment: pt is still smoking  Substance and Sexual Activity  . Alcohol use: No  . Drug use: No  . Sexual activity: Not on file  Lifestyle  . Physical activity:    Days per week: Not on file    Minutes per session: Not on file  . Stress: Not on file  Relationships  . Social connections:    Talks on phone: Not on file    Gets together: Not on file    Attends religious service: Not on file    Active member of club or organization: Not on file    Attends meetings of clubs or organizations: Not on file    Relationship status: Not on file  . Intimate partner violence:    Fear of current or ex partner: Not on file    Emotionally abused: Not on file    Physically abused: Not on file    Forced sexual activity: Not on file  Other Topics Concern  . Not on file  Social History Narrative  . Not on file    FAMILY HISTORY Family History  Adopted: Yes  Problem Relation Age of Onset  . Diabetes Maternal Grandmother   . Prostate cancer Neg Hx   . Bladder Cancer Neg Hx   . Kidney cancer Neg Hx     ALLERGIES:  has No Known Allergies.  MEDICATIONS:  Current Outpatient Medications  Medication Sig Dispense Refill  . alendronate (FOSAMAX) 70 MG tablet Take 70 mg by mouth once a week. Fridays    . aspirin 325 MG tablet Take 325 mg daily by mouth.    Marland Kitchen atorvastatin (LIPITOR) 40 MG tablet Take 40 mg daily by mouth.    . benztropine (COGENTIN) 1 MG tablet Take 1 mg by mouth at bedtime.    Marland Kitchen buPROPion (WELLBUTRIN XL) 150 MG 24 hr tablet Take 150 mg by mouth daily.     . calcium carbonate (OS-CAL) 600 MG TABS tablet Take 600 mg by mouth 2 (two) times daily with a meal.     . cholecalciferol (VITAMIN D) 1000 units tablet Take 1,000 Units by mouth daily.    . citalopram (CELEXA) 40  MG tablet Take 40 mg by mouth daily.     . clonazePAM (KLONOPIN) 0.5 MG tablet Take 0.5 mg by mouth daily.     . clopidogrel (PLAVIX) 75 MG tablet Take 75 mg daily by mouth.    . fluticasone (FLONASE) 50 MCG/ACT nasal spray Place 2 sprays into both nostrils daily.     . Glycopyrrolate-Formoterol (BEVESPI AEROSPHERE) 9-4.8 MCG/ACT AERO Inhale 2 puffs into the lungs 2 (two) times daily.    Marland Kitchen loratadine (CLARITIN) 10 MG tablet Take 10 mg by mouth daily.    . risperidone (RISPERDAL) 4 MG tablet Take 4 mg by mouth at bedtime.     . traZODone (DESYREL) 50 MG tablet Take  1 tablet (50 mg total) by mouth at bedtime.    Marland Kitchen albuterol (PROVENTIL HFA;VENTOLIN HFA) 108 (90 Base) MCG/ACT inhaler Inhale into the lungs every 6 (six) hours as needed for wheezing or shortness of breath.    Marland Kitchen alum & mag hydroxide-simeth (GERI-LANTA) 200-200-20 MG/5ML suspension Take 15 mLs by mouth every 6 (six) hours as needed for indigestion or heartburn.    Marland Kitchen amoxicillin-clavulanate (AUGMENTIN) 875-125 MG tablet Take 1 tablet by mouth every 12 (twelve) hours. (Patient not taking: Reported on 08/03/2018) 4 tablet 0  . polyethylene glycol (MIRALAX / GLYCOLAX) packet Take 17 g by mouth daily as needed for mild constipation or moderate constipation.      Current Facility-Administered Medications  Medication Dose Route Frequency Provider Last Rate Last Dose  . lacosamide (VIMPAT) tablet 200 mg  200 mg Oral BID Garvin Fila, MD        PHYSICAL EXAMINATION: ECOG PERFORMANCE STATUS: 1 - Symptomatic but completely ambulatory   Vitals:   08/03/18 1419  BP: 115/83  Pulse: 77  Resp: 18    Filed Weights   08/03/18 1419  Weight: 177 lb 1.6 oz (80.3 kg)   Physical Exam  Constitutional: No distress.  HENT:  Head: Normocephalic and atraumatic.  Nose: Nose normal.  Mouth/Throat: Oropharynx is clear and moist. No oropharyngeal exudate.  Eyes: Pupils are equal, round, and reactive to light. EOM are normal. No scleral icterus.   Neck: Normal range of motion. Neck supple.  Cardiovascular: Normal rate and regular rhythm.  No murmur heard. Pulmonary/Chest: Effort normal. No respiratory distress. He has no rales. He exhibits no tenderness.  Abdominal: Soft. He exhibits no distension. There is no tenderness.  Musculoskeletal: Normal range of motion. He exhibits no edema.  Neurological: He is alert. No cranial nerve deficit. He exhibits normal muscle tone. Coordination normal.  Skin: Skin is warm and dry. He is not diaphoretic. No erythema.  Psychiatric: Affect normal.  Nursing note and vitals reviewed.  LABORATORY DATA: I have personally reviewed the data as listed: He had labs on with primary care provider on December 15 2016 which showed WBC 5.3 hemoglobin 14.3 MCV 88 RDW 13.8 MPV 11.37 platelet 111 lymphocyte 46.69% absolute lymphocytes 2.5 normal is BMPnormal LFT. It was noted that hepatitis C antibody reactive. Patient was seen by [facility] group for workupof her positive hepatitis C antibody Further tests including HCV RNA by PCR was negative.  Hep B core antibody negative, Hb surface antibody nonreactive,A antibody positive,Hep A IgM negative. CBC    Component Value Date/Time   WBC 4.8 08/03/2018 1357   RBC 4.61 08/03/2018 1357   HGB 12.9 (L) 08/03/2018 1357   HGB 13.6 04/07/2018 1320   HCT 41.6 08/03/2018 1357   HCT 42.6 04/07/2018 1320   PLT 106 (L) 08/03/2018 1357   PLT 121 (L) 04/07/2018 1320   MCV 90.2 08/03/2018 1357   MCV 86 04/07/2018 1320   MCH 28.0 08/03/2018 1357   MCHC 31.0 08/03/2018 1357   RDW 12.5 08/03/2018 1357   RDW 12.2 (L) 04/07/2018 1320   LYMPHSABS 2.3 08/03/2018 1357   LYMPHSABS 2.3 04/07/2018 1320   MONOABS 0.4 08/03/2018 1357   EOSABS 0.4 08/03/2018 1357   EOSABS 0.4 04/07/2018 1320   BASOSABS 0.0 08/03/2018 1357   BASOSABS 0.0 04/07/2018 1320   CMP Latest Ref Rng & Units 08/03/2018 07/06/2018 07/05/2018  Glucose 70 - 99 mg/dL 114(H) 116(H) 128(H)  BUN 6 - 20 mg/dL '20  14 19  '$ Creatinine  0.61 - 1.24 mg/dL 0.98 0.97 1.54(H)  Sodium 135 - 145 mmol/L 142 143 139  Potassium 3.5 - 5.1 mmol/L 4.1 4.4 4.0  Chloride 98 - 111 mmol/L 108 112(H) 103  CO2 22 - 32 mmol/L '26 24 25  '$ Calcium 8.9 - 10.3 mg/dL 9.3 8.6(L) 9.4  Total Protein 6.5 - 8.1 g/dL 7.3 - 7.5  Total Bilirubin 0.3 - 1.2 mg/dL 0.6 - 1.1  Alkaline Phos 38 - 126 U/L 60 - 46  AST 15 - 41 U/L 35 - 30  ALT 0 - 44 U/L 39 - 26  Results for RAVINDER, LUKEHART (MRN [id number]) as of 04/27/2017 11:09  Ref. Range 04/20/2017 12:10 04/20/2017 12:21  Folate Latest Ref Range: >5.9 ng/mL  11.0  Vitamin B12 Latest Ref Range: 180 - 914 pg/mL 392    Results for ERICKSON, YAMASHIRO (MRN [id number]) as of 04/27/2017 11:09  Ref. Range 04/20/2017 12:21  LDH Latest Ref Range: 98 - 192 U/L 185   Peripheral lab flow cytometry No monoclonal B cell population is detected. kappa:lambda ratio 1.8  There is no loss of, or aberrant expression of, the pan T cell  antigens to  suggest a neoplastic T cell process.  CD4:CD8 ratio 2.5  No circulating blasts are detected. There is no immunophenotypic  evidence  of abnormal myeloid maturation.  Analysis of the leukocyte population shows: granulocytes 50%,  monocytes  10%, lymphocytes 40%, blasts <0.5%, B cells 8%, T cells 30%, NK cells  2%.    RADIOGRAPHIC STUDIES: I have personally reviewed the radiological images as listed and agree with the findings in the report US abdomen 04/23/2017 FINDINGS: Gallbladder: Surgically absent.Common bile duct: Diameter: 3 mm. No intrahepatic, common hepatic, common bile duct dilatation.Liver: No focal lesion identified. Within normal limits inparench ymal echogenicity. IVC: No abnormality visualized in areas which can be interrogated.Portions of the infrahepatic inferior vena cava are obscured by gas. Pancreas: Pancreas essentially completely obscured by gas. Spleen: Size and appearance within normal limits. Spleen measures 9.1 x 10.0 x 3.9 cm with a measured  splenic volume of 186 cubic cm. Right Kidney: Length: 10.8 cm. Echogenicity within normal limits. No hydronephrosis visualized. There is a mildly complex cystic structure in the lower pole the right kidney measuring 4.4 x 2.3 x 2.8 cm, similar to prior study. There is a nearby second mildly complex cystic structure toward the mid right kidney measuring 1.4 x1.4 x 1.7 cm, essentially stable from prior study. There is a cyst in the midportion of the kidney measuring 2.7 x 2.4 x 2.7 cm. No newrenal lesions evident. Left Kidney: Length: 11.5 cm. Echogenicity within normal limits. No hydronephrosis visualized. There is a simple cyst in the mid left kidney measuring 0.9 x 1.0 x 1.1 cm. Abdominal aorta: No aneurysm visualized in areas which can be interrogated. Portions of the aorta are obscured by gas. Other findings: No demonstrable ascites.  IMPRESSION:1.  No liver or splenic lesions.2.  Gallbladder absent.3. Stable mildly complex cystic areas in the right kidney. No new renal lesion identified on the right. Small simple cyst mid left kidney.4. Pancreas essentially completely obscured by gas. Portions of inferior vena cava and aorta obscured by gas. Visualized portions ofthese structures appear unremarkable.    ASSESSMENT/PLAN 1. Thrombocytopenia ([place])    #Labs are reviewed and discussed with patient and group home staff.  Platelet counts 106,000, stable. He has had extensive laboratory work-up without revealing etiology explaining thrombocytopenia. Possible drug-induced thrombocytopenia, or ITP.  Cannot rule out underlying bone marrow disorders.  Risperidone potentially can cause thrombocytopenia. Given patient's baseline mental status and chronic multiple other issues, hold bone marrow biopsy for further evaluation. If he develops rapid decline of thrombocytopenia, will give trial of steroids and consider switch to other psychiatry medication..  Follow-up in clinic in 6 months.  All  questions were answered. The patient knows to call the clinic with any problems, questions or concerns. Total face to face encounter time for this patient visit was 15 min. >50% of the time was  spent in counseling and coordination of care.    Earlie Server, MD, PhD Hematology Oncology [facility] Pager- [phone] 08/04/2018

## 2018-08-23 ENCOUNTER — Ambulatory Visit
Admission: RE | Admit: 2018-08-23 | Discharge: 2018-08-23 | Disposition: A | Discharge status: Home / Self Care | Payer: Medicare Other | Source: Ambulatory Visit | Attending: Urology | Admitting: Urology

## 2018-08-23 DIAGNOSIS — N281 Cyst of kidney, acquired: Secondary | ICD-10-CM

## 2018-08-23 NOTE — Progress Notes (Signed)
08/24/2018  10:05 AM   [name] 02/15/1961 [id number]  Referring provider: [name], [place], [place] [address]  Chief Complaint  Patient presents with  . Follow-up    RUS report    HPI: Mario Liu is a 57 y.o. African American male with Down Syndrome who presents today for his 3 month follow up for RUS followup with his caretaker, Dewaine Oats. He has a history of BPH with LUTS and a right renal complex cyst.  Background history Patient was referred by [name], AGNP for nephrolithiasis from the [place] [facility]. At that time, he was experiencing frequency, incontinence, is having dysuria. He denied any gross hematuria, dysuria, suprapubic/flank pain, fevers, chills, nausea, vomiting, and any other associated symptoms. His UA was positive for many bacteria.  RUS performed at providers location noted a 2 cm stone in the right kidney associated with hydronephrosis.  Former smoker.  Quit 6 months ago.    CT Renal stone study on 04/19/2018 noted enlargement of the primarily hypodense lesion of the right kidney lower pole with curvilinear calcifications along its margins. This had a cystic appearance on ultrasound of 04/23/2017 but is currently larger than at that time. The dense marginal calcification indicates complexity. Given the degree of motion artifact as well as the lack of IV contrast, exclusion of cystic malignancy is problematic. Given the degree of motion artifact on CT, MRI is likely impractical.  Surveillance by ultrasound is likely warranted to assess for any visible soft tissue components.  Fluid density cyst of the right mid kidney noted.  No appreciable definite stones separate from the marginal calcification of the cystic lesion of the right kidney lower pole.  Prominent stool throughout the colon favors constipation.  Lumbar spondylosis and degenerative disc disease.  On 04/21/2018, the patient's caregiver stated he has no complaints at  this time.  Patient denies any gross hematuria, dysuria or suprapubic/flank pain.  Patient denies any fevers, chills, nausea or vomiting.   RUS on 05/10/2018 revealed multiloculated cystic mass or 2 adjacent cysts in the lower pole of the right kidney, spanning 6.5 x 5.4 x 5.3 cm. There are multiple new cysts an enlarging cysts in that region. On 04/23/2017, the largest cyst measured 4.4 x 2.8 x 2.3 cm, currently measuring 5.9 x 3.1 x 2.5 cm. No solid components are visualized.  Also demonstrated is a 3.4 x 3.0 x 2.5 cm mid right renal cyst. No solid masses were seen by ultrasound.  RUS on 08/22/2018 revealed a complex/multiseptated cyst in the inferior pole of the right kidney with minimally thickened septation. There may be some vascularity to one of the septations. Further evaluation with MRI without and with contrast is recommended.  Patient reports that he was able to tolerate the MRI of his head without monitored anesthesia. Today, 08/24/2018, his caretaker has noted increased urgency, frequency and occasionally incontinence.  He recently completed a course of antibiotics for a UTI that his caretaker believes may be the reason for his urgency and frequency.     PMH: Past Medical History:  Diagnosis Date  . COPD (chronic obstructive pulmonary disease) ([place])   . Hepatitis C   . Hx of cholecystectomy   . Hypothyroidism   . Kidney cysts   . Major depressive disorder    Was treated at facility in [place] ([facility]) years ago  . Stroke ([place])   . Tobacco abuse   . Vitamin D deficiency    Surgical History:  Past Surgical History:  Procedure Laterality Date  . LAPAROSCOPIC CHOLECYSTECTOMY     Home Medications:  Allergies as of 08/24/2018   No Known Allergies     Medication List        Accurate as of 08/24/18 10:05 AM. Always use your most recent med list.          albuterol 108 (90 Base) MCG/ACT inhaler Commonly known as:  PROVENTIL HFA;VENTOLIN HFA Inhale into the lungs every 6  (six) hours as needed for wheezing or shortness of breath.   alendronate 70 MG tablet Commonly known as:  FOSAMAX Take 70 mg by mouth once a week. Fridays   aspirin 325 MG tablet Take 325 mg daily by mouth.   atorvastatin 40 MG tablet Commonly known as:  LIPITOR Take 40 mg daily by mouth.   benztropine 1 MG tablet Commonly known as:  COGENTIN Take 1 mg by mouth at bedtime.   BEVESPI AEROSPHERE 9-4.8 MCG/ACT Aero Generic drug:  Glycopyrrolate-Formoterol Inhale 2 puffs into the lungs 2 (two) times daily.   buPROPion 150 MG 24 hr tablet Commonly known as:  WELLBUTRIN XL Take 150 mg by mouth daily.   calcium carbonate 600 MG Tabs tablet Commonly known as:  OS-CAL Take 600 mg by mouth 2 (two) times daily with a meal.   cholecalciferol 1000 units tablet Commonly known as:  VITAMIN D Take 1,000 Units by mouth daily.   citalopram 40 MG tablet Commonly known as:  CELEXA Take 40 mg by mouth daily.   clonazePAM 0.5 MG tablet Commonly known as:  KLONOPIN Take 0.5 mg by mouth daily.   clopidogrel 75 MG tablet Commonly known as:  PLAVIX Take 75 mg daily by mouth.   fluticasone 50 MCG/ACT nasal spray Commonly known as:  FLONASE Place 2 sprays into both nostrils daily.   GERI-LANTA 200-200-20 MG/5ML suspension Generic drug:  alum & mag hydroxide-simeth Take 15 mLs by mouth every 6 (six) hours as needed for indigestion or heartburn.   loratadine 10 MG tablet Commonly known as:  CLARITIN Take 10 mg by mouth daily.   polyethylene glycol packet Commonly known as:  MIRALAX / GLYCOLAX Take 17 g by mouth daily as needed for mild constipation or moderate constipation.   risperidone 4 MG tablet Commonly known as:  RISPERDAL Take 4 mg by mouth at bedtime.   traZODone 50 MG tablet Commonly known as:  DESYREL Take 1 tablet (50 mg total) by mouth at bedtime.      Allergies: No Known Allergies  Family History: Family History  Adopted: Yes  Problem Relation Age of Onset    . Diabetes Maternal Grandmother   . Prostate cancer Neg Hx   . Bladder Cancer Neg Hx   . Kidney cancer Neg Hx    Social History:  reports that he has been smoking cigarettes. He has a 19.50 pack-year smoking history. He has never used smokeless tobacco. He reports that he does not drink alcohol or use drugs.  ROS: Patient unable to report due to MR.  Physical Exam: BP 109/75 (BP Location: Left Arm, Patient Position: Sitting, Cuff Size: Normal)   Pulse 87   Ht 5\' 7"  (1.702 m)   Wt 175 lb 11.2 oz (79.7 kg)   BMI 27.52 kg/m   Constitutional: Well nourished. Alert, No acute distress. Respiratory: Normal respiratory effort, no increased work of breathing. Skin: No rashes, bruises or suspicious lesions. Neurologic: Grossly intact, no focal deficits, moving all 4 extremities. Psychiatric: Normal mood and affect.  Laboratory Data:  Results for CYLER, KAPPES (MRN [id number]) as of 08/23/2018 09:36 COMPREHENSIVE METABOLIC PANEL Ref. Range 08/03/2018 13:57  BUN 6 - 20 mg/dL 20  Creatinine 0.61 - 1.24 mg/dL 0.98  Calcium 8.9 - 10.3 mg/dL 9.3  Alkaline Phosphatase 38 - 126 U/L 60  AST 15 - 41 U/L 35  ALT 0 - 44 U/L 39  Total Protein 6.5 - 8.1 g/dL 7.3  Total Bilirubin 0.3 - 1.2 mg/dL 0.6  GFR, Est Non African American >60 mL/min >60  GFR, Est African American >60 mL/min >60  WBC 4.0 - 10.5 K/uL 4.8  RBC 4.22 - 5.81 MIL/uL 4.61  Hemoglobin 13.0 - 17.0 g/dL 12.9 (L)  HCT 39.0 - 52.0 % 41.6  MCV 80.0 - 100.0 fL 90.2  MCH 26.0 - 34.0 pg 28.0  MCHC 30.0 - 36.0 g/dL 31.0  Platelets 150 - 400 K/uL 106 (L)   PSA trend 04/08/2018  Ref Range: 0.0 - 4.0ng/mL 0.6   I have reviewed the labs.  Pertinent Imaging CLINICAL DATA:  57 year old male with renal cysts.  EXAM: RENAL / URINARY TRACT ULTRASOUND COMPLETE  COMPARISON:  Ultrasound dated 05/10/2018 and CT dated 04/19/2018  FINDINGS: Right Kidney:  Renal measurements: 11.9 x 7.2 x 7.2 cm = volume: 265 mL. Slight increased  echogenicity. There is a complex/multi septated cyst in the inferior pole of the right kidney measuring 7.7 x 5.2 x 5.4 cm similar to the prior ultrasound. There is a mildly thickened peripheral septation measuring 4 mm with possible vascularity. A 9 mm echogenic focus along the inferior aspect of the cyst corresponds to the calcification seen on the CT. Additional smaller cysts noted. There is no hydronephrosis or obstructing stone.  Left Kidney:  Renal measurements: 11.3 x 6.0 x 5.1 cm = volume: 180 mL. Mild increased echogenicity. A 1.2 x 1.2 x 1.2 cm cyst with a thin septation along the lateral interpolar left kidney. No internal vascularity. No hydronephrosis or shadowing stone.  Bladder:  Mildly thickened and trabeculated bladder wall, likely partly related to underdistention.  IMPRESSION: Complex/multiseptated cyst in the inferior pole of the right kidney with minimally thickened septation. There may be some vascularity to one of the septations. Further evaluation with MRI without and with contrast is recommended.   Electronically Signed   By: Anner Crete M.D.   On: 08/24/2018 00:13  I have independently reviewed the films  Assessment & Plan:    1. Right renal complex cyst - RUS revealed vascularity in one of the cyst septations and confirmed complex/multiseptated cyst in the inferior pole of the right kidney with minimally thickened septation. MRI recommended, may be difficult due to patients condition. - Patient and caretaker expressed that the patient was able to tolerate his recent Head MRI without any anesthesia. Will schedule MRI. - Discussed 08/22/2018 RUS results, concerns and indications with patient and his caretaker. Addressed patient and caretaker questions and concerns.  - RTC pending kidney MRI for results  2. LU TS - patient's caregiver will monitor his urinary habits more closely over the next week so that we may get a clearer picture of  what they might be   Return for Kidney MRI report.  These notes generated with voice recognition software. I apologize for typographical errors.  Laneta Simmers   [facility] [address]  [place], [place] [address] [phone]  I, Temidayo Atanda-Ogunleye , am acting as a Education administrator for Nori Riis, PA-C  I have  reviewed the above documentation for accuracy and completeness, and I agree with the above.    Zara Council, PA-C  15-minute discussion with patient regarding abnormal lab results, diagnosis possibilities, treatment options, risks and benefits. The patient had few questions and concerns regarding these options and the long-term effects.

## 2018-08-24 ENCOUNTER — Encounter: Payer: Self-pay | Admitting: Urology

## 2018-08-24 ENCOUNTER — Ambulatory Visit (INDEPENDENT_AMBULATORY_CARE_PROVIDER_SITE_OTHER): Payer: Medicare Other | Admitting: Urology

## 2018-08-24 VITALS — BP 109/75 | HR 87 | Ht 67.0 in | Wt 175.7 lb

## 2018-08-24 DIAGNOSIS — I639 Cerebral infarction, unspecified: Secondary | ICD-10-CM

## 2018-08-24 DIAGNOSIS — N281 Cyst of kidney, acquired: Secondary | ICD-10-CM | POA: Diagnosis not present

## 2018-09-28 ENCOUNTER — Ambulatory Visit
Admission: RE | Admit: 2018-09-28 | Discharge: 2018-09-28 | Disposition: A | Discharge status: Home / Self Care | Payer: Medicare Other | Source: Ambulatory Visit | Attending: Urology | Admitting: Urology

## 2018-09-28 DIAGNOSIS — N281 Cyst of kidney, acquired: Secondary | ICD-10-CM

## 2018-09-28 LAB — POCT I-STAT CREATININE: Creatinine, Ser: 1.3 mg/dL — ABNORMAL HIGH (ref 0.61–1.24)

## 2018-09-28 MED ORDER — GADOBUTROL 1 MMOL/ML IV SOLN
8.0000 mL | Freq: Once | INTRAVENOUS | Status: AC | PRN
  Administered 2018-09-28: 8 mL via INTRAVENOUS

## 2018-09-30 ENCOUNTER — Ambulatory Visit (INDEPENDENT_AMBULATORY_CARE_PROVIDER_SITE_OTHER): Payer: Medicare Other | Admitting: Urology

## 2018-09-30 ENCOUNTER — Telehealth: Payer: Self-pay | Admitting: Urology

## 2018-09-30 ENCOUNTER — Encounter: Payer: Self-pay | Admitting: Urology

## 2018-09-30 VITALS — BP 107/75 | HR 78 | Ht 67.0 in | Wt 175.0 lb

## 2018-09-30 DIAGNOSIS — C641 Malignant neoplasm of right kidney, except renal pelvis: Secondary | ICD-10-CM | POA: Diagnosis not present

## 2018-09-30 NOTE — Telephone Encounter (Signed)
I have left a message for Mario Liu, (his sister and legal guardian [phone]) to schedule an appointment with Dr. Erlene Quan to discuss the next steps in treating his kidney cancer.  I realize she lives in [place] and she is in poor health, but it is very important that she comes with Mario Liu for the appointment vs an over the phone conference.  Dr. Erlene Quan would like to go over the images with her and explain in details his options and the benefits/risks of his options.

## 2018-09-30 NOTE — Progress Notes (Signed)
09/30/2018  4:41 PM   Mario Liu 03-30-1961 [id number]  Referring provider: [name], [place], [place] [address]  Chief Complaint  Patient presents with  . Results   HPI: Mario Liu is a 58 y.o. Black or [place] American male with Down Syndrome who presents today to discuss the results of his recent MRI Abd w/wo contrast. He is accompanied by a caretaker. He has a history of BPH with LUTS and a right renal complex cyst.   Background history Patient was referred by [name], AGNP for nephrolithiasis from the [facility]. At that time, he was experiencing frequency, incontinence, is having dysuria. He denied any gross hematuria, dysuria, suprapubic/flank pain, fevers, chills, nausea, vomiting, and any other associated symptoms. His UA was positive for many bacteria.  RUS performed at providers location noted a 2 cm stone in the right kidney associated with hydronephrosis.  Former smoker.  Quit 6 months ago.    CT Renal stone study on 04/19/2018 noted enlargement of the primarily hypodense lesion of the right kidney lower pole with curvilinear calcifications along its margins. This had a cystic appearance on ultrasound of 04/23/2017 but is currently larger than at that time. The dense marginal calcification indicates complexity. Given the degree of motion artifact as well as the lack of IV contrast, exclusion of cystic malignancy is problematic. Given the degree of motion artifact on CT, MRI is likely impractical.  Surveillance by ultrasound is likely warranted to assess for any visible soft tissue components.  Fluid density cyst of the right mid kidney noted.  No appreciable definite stones separate from the marginal calcification of the cystic lesion of the right kidney lower pole.  Prominent stool throughout the colon favors constipation.  Lumbar spondylosis and degenerative disc disease.  On 04/21/2018, the patient's caregiver stated he has no  complaints at this time.  Patient denies any gross hematuria, dysuria or suprapubic/flank pain.  Patient denies any fevers, chills, nausea or vomiting.   RUS on 05/10/2018 revealed multiloculated cystic mass or 2 adjacent cysts in the lower pole of the right kidney, spanning 6.5 x 5.4 x 5.3 cm. There are multiple new cysts an enlarging cysts in that region. On 04/23/2017, the largest cyst measured 4.4 x 2.8 x 2.3 cm, currently measuring 5.9 x 3.1 x 2.5 cm. No solid components are visualized.  Also demonstrated is a 3.4 x 3.0 x 2.5 cm mid right renal cyst. No solid masses were seen by ultrasound.  RUS on 08/22/2018 revealed a complex/multiseptated cyst in the inferior pole of the right kidney with minimally thickened septation. There may be some vascularity to one of the septations. Further evaluation with MRI without and with contrast is recommended.  Patient reports that he was able to tolerate the MRI of his head without monitored anesthesia. On 08/24/2018, his caretaker has noted increased urgency, frequency and occasionally incontinence.  He completed a course of antibiotics for a UTI that his caretaker believes may be the reason for his urgency and frequency.  MRI Abd w/wo contrast on 09/28/2018 found the following. A 3.3 x 2.8 cm enhancing mass of the right kidney upper pole that was relatively occult on prior imaging workups due to lack of contrast. 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 A 6.5 x 5.2 cm Bosniac category 63F cystic right renal hilum lesion extending into the lower pole with several internal septation, known calcification, larger than previously noted. Some calyceal  dilation along upper margin. No definite nodularity.  PMH: Past Medical History:  Diagnosis Date  . COPD (chronic obstructive pulmonary disease) ([place])   . Hepatitis C   . Hx of cholecystectomy   . Hypothyroidism   . Kidney cysts   . Major depressive disorder    Was  treated at facility in [place] ([facility]) years ago  . Stroke ([place])   . Tobacco abuse   . Vitamin D deficiency     Surgical History: Past Surgical History:  Procedure Laterality Date  . LAPAROSCOPIC CHOLECYSTECTOMY      Home Medications:  Allergies as of 09/30/2018   No Known Allergies     Medication List       Accurate as of September 30, 2018 11:59 PM. Always use your most recent med list.        albuterol 108 (90 Base) MCG/ACT inhaler Commonly known as:  PROVENTIL HFA;VENTOLIN HFA Inhale into the lungs every 6 (six) hours as needed for wheezing or shortness of breath.   alendronate 70 MG tablet Commonly known as:  FOSAMAX Take 70 mg by mouth once a week. Fridays   aspirin 325 MG tablet Take 325 mg daily by mouth.   atorvastatin 40 MG tablet Commonly known as:  LIPITOR Take 40 mg daily by mouth.   benztropine 1 MG tablet Commonly known as:  COGENTIN Take 1 mg by mouth at bedtime.   BEVESPI AEROSPHERE 9-4.8 MCG/ACT Aero Generic drug:  Glycopyrrolate-Formoterol Inhale 2 puffs into the lungs 2 (two) times daily.   buPROPion 150 MG 24 hr tablet Commonly known as:  WELLBUTRIN XL Take 150 mg by mouth daily.   calcium carbonate 600 MG Tabs tablet Commonly known as:  OS-CAL Take 600 mg by mouth 2 (two) times daily with a meal.   cholecalciferol 1000 units tablet Commonly known as:  VITAMIN D Take 1,000 Units by mouth daily.   citalopram 40 MG tablet Commonly known as:  CELEXA Take 40 mg by mouth daily.   clonazePAM 0.5 MG tablet Commonly known as:  KLONOPIN Take 0.5 mg by mouth daily.   clopidogrel 75 MG tablet Commonly known as:  PLAVIX Take 75 mg daily by mouth.   fluticasone 50 MCG/ACT nasal spray Commonly known as:  FLONASE Place 2 sprays into both nostrils daily.   GERI-LANTA 200-200-20 MG/5ML suspension Generic drug:  alum & mag hydroxide-simeth Take 15 mLs by mouth every 6 (six) hours as needed for indigestion or heartburn.   loratadine 10  MG tablet Commonly known as:  CLARITIN Take 10 mg by mouth daily.   polyethylene glycol packet Commonly known as:  MIRALAX / GLYCOLAX Take 17 g by mouth daily as needed for mild constipation or moderate constipation.   risperidone 4 MG tablet Commonly known as:  RISPERDAL Take 4 mg by mouth at bedtime.   traZODone 50 MG tablet Commonly known as:  DESYREL Take 1 tablet (50 mg total) by mouth at bedtime.       Allergies: No Known Allergies  Family History: Family History  Adopted: Yes  Problem Relation Age of Onset  . Diabetes Maternal Grandmother   . Prostate cancer Neg Hx   . Bladder Cancer Neg Hx   . Kidney cancer Neg Hx     Social History:  reports that he has been smoking cigarettes. He has a 19.50 pack-year smoking history. He has never used smokeless tobacco. He reports that he does not drink alcohol or use drugs.  ROS: UROLOGY Frequent  Urination?: No Hard to postpone urination?: No Burning/pain with urination?: No Get up at night to urinate?: No Leakage of urine?: No Urine stream starts and stops?: No Trouble starting stream?: No Do you have to strain to urinate?: No Blood in urine?: No Urinary tract infection?: No Sexually transmitted disease?: No Injury to kidneys or bladder?: No Painful intercourse?: No Weak stream?: No Erection problems?: No Penile pain?: No  Gastrointestinal Nausea?: No Vomiting?: No Indigestion/heartburn?: No Diarrhea?: No Constipation?: No  Constitutional Fever: No Night sweats?: No Weight loss?: No Fatigue?: No  Skin Skin rash/lesions?: No Itching?: No  Eyes Blurred vision?: No Double vision?: No  Ears/Nose/Throat Sore throat?: No Sinus problems?: No  Hematologic/Lymphatic Swollen glands?: No Easy bruising?: No  Cardiovascular Leg swelling?: No Chest pain?: No  Respiratory Cough?: No Shortness of breath?: No  Endocrine Excessive thirst?: No  Musculoskeletal Back pain?: No Joint pain?:  No  Neurological Headaches?: No Dizziness?: No  Psychologic Depression?: No Anxiety?: No  Physical Exam: BP 107/75   Pulse 78   Ht 5\' 7"  (1.702 m)   Wt 175 lb (79.4 kg)   BMI 27.41 kg/m   Constitutional:  Alert and oriented, No acute distress. Respiratory: Normal respiratory effort, no increased work of breathing. GU: No CVA tenderness.  Skin: No rashes, bruises or suspicious lesions. Neurologic: Grossly intact, no focal deficits, moving all 4 extremities. Psychiatric: Normal mood and affect.  Laboratory Data: Lab Results  Component Value Date   WBC 4.8 08/03/2018   HGB 12.9 (L) 08/03/2018   HCT 41.6 08/03/2018   MCV 90.2 08/03/2018   PLT 106 (L) 08/03/2018   Lab Results  Component Value Date   CREATININE 1.30 (H) 09/28/2018   Lab Results  Component Value Date   HGBA1C 5.9 (H) 05/15/2017   Pertinent Imaging: CLINICAL DATA:  Follow up right renal cyst found on CT scan of August 2019.  EXAM: MRI ABDOMEN WITHOUT AND WITH CONTRAST  TECHNIQUE: Multiplanar multisequence MR imaging of the abdomen was performed both before and after the administration of intravenous contrast.  CONTRAST:  8 cc Gadavist  COMPARISON:  04/19/2018 and ultrasound of 08/23/2018  FINDINGS: Despite efforts by the technologist and patient, motion artifact is present on today's exam and could not be eliminated. This reduces exam sensitivity and specificity.  Lower chest: Unremarkable  Hepatobiliary: Cholecystectomy.  Otherwise unremarkable.  Pancreas:  Unremarkable  Spleen:  Unremarkable  Adrenals/Urinary Tract: Slightly thickened adrenal glands without discrete mass.  3.3 by 2.9 cm enhancing mass of the right kidney upper pole has low T2 signal characteristics, suspicious for papillary renal cell carcinoma. Precontrast signal intensity measurements are in the 130s and postcontrast signal intensity measurements on arterial phase images (which are the post-contrast  series least affected by motion artifact) are in the 230 range.  The cystic lesion of concern along the right renal hilum in the right kidney lower pole measures 6.0 by 5.2 cm on image 23/9, and contains multiple internal septations but no overt nodularity. There potentially some small vessels tracking along the septations based on the coronal images such image 40/21. I suspect excreted contrast material settling into a fluid-level along the upper portion of this lesion example on image 40/19 and image 22/22, suggesting possible calyceal diverticulum along the margin of the lesion.  Several additional small but on cystic lesions are present in the kidneys.  Stomach/Bowel: Unremarkable  Vascular/Lymphatic:  No pathologic adenopathy identified.  Other:  No supplemental non-categorized findings.  Musculoskeletal: Degenerative endplate findings at D3-2 eccentric  to the right. Mild levoconvex lumbar scoliosis.  IMPRESSION: 1. A 3.3 by 2.9 cm enhancing mass of the right kidney upper pole is present. 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 This lesion is been relatively occult on the prior imaging workups due to lack of IV contrast. 2. 6.5 by 5.2 cm cystic lesion the right renal hilum extending into the lower pole contains several internal septations and has known calcification. Going back and measuring this lesion on 04/19/2018 exam it measured about 5.5 cm in diameter. I would categorize this as a Bosniak category 99F cystic lesion. There is potentially some calyceal dilatation along the upper margin of the lesion. No definite nodularity although there are some vessels running along the septations. Motion artifact obscures some detail and may affect characterization of this lesion.   Electronically Signed   By: Van Clines M.D.   On: 09/28/2018 12:37  I have reviewed the images  Assessment and Plan:  1. Bosniak  99F cystic lesion in right kidney   - Patient was accompanied today by a caretaker.  - We reviewed the findings of his MRI Abd w/wo contrast from 09/28/2018.   - We discussed that he will need to follow up with Dr. Erlene Quan to discuss his options given these findings with his legal guardian present.  - Called to notify his legal guardian, sister, of his MRI results and emphasized my strong recommendation to schedule a follow up appt with Dr. Erlene Quan. Sister would like to check her schedule and call back.  Return for appointment with Dr. Erlene Quan .  Zara Council, PA-C [facility] [address] [place], [place] [address] [phone]  I, Temidayo Atanda-Ogunleye , am acting as a scribe for [facility], PA-C  I have reviewed the above documentation for accuracy and completeness, and I agree with the above.    Zara Council, PA-C  I spent 15 minutes with this patient of in a face to face visit which greater than 50% was spent in counseling and coordination of care with the patient regarding the MRI findings and follow up.

## 2018-10-04 ENCOUNTER — Telehealth: Payer: Self-pay | Admitting: Urology

## 2018-10-04 NOTE — Telephone Encounter (Signed)
I have spoken with Mario Liu (sister and legal guardian) and Rise Paganini at his facility and notified them both of the appointment time and the seriousness of the need to keep that appointment to discuss his treatment for his renal mass.  Also, we need to take the [phone] out and put in [phone].  He no longer resides at the [address].

## 2018-10-06 ENCOUNTER — Ambulatory Visit (INDEPENDENT_AMBULATORY_CARE_PROVIDER_SITE_OTHER): Payer: Medicare Other | Admitting: Urology

## 2018-10-06 ENCOUNTER — Encounter: Payer: Self-pay | Admitting: Urology

## 2018-10-06 VITALS — BP 119/82 | HR 77 | Ht 67.0 in | Wt 179.8 lb

## 2018-10-06 DIAGNOSIS — N2889 Other specified disorders of kidney and ureter: Secondary | ICD-10-CM | POA: Diagnosis not present

## 2018-10-06 DIAGNOSIS — N281 Cyst of kidney, acquired: Secondary | ICD-10-CM | POA: Diagnosis not present

## 2018-10-06 NOTE — Progress Notes (Signed)
10/06/2018 4:57 PM   [name] 1961/08/16 [id number]  Referring provider: [name], [place], [place] [address]  Chief Complaint  Patient presents with  . discuss options    HPI: Mario Liu is a 58 y.o. 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    He is accompanied today by his health care power of attorney (sister), a family friend, and a caretaker.  He has no complaints including no urinary symptoms or flank pain.  Background history He initially was here for further evaluation of possible kidney stone and urinary symptoms.  CT Renal stone study on 04/19/2018 noted enlargement of the primarily hypodense lesion of the right kidney lower pole with curvilinear calcifications along its margins. This had a cystic appearance on ultrasound of 04/23/2017 but is currently larger than at that time. The dense marginal calcification indicates complexity. Given the degree of motion artifact as well as the lack of IV contrast, exclusion of cystic malignancy is problematic  RUS on 05/10/2018 revealed multiloculated cystic mass or 2 adjacent cysts in the lower pole of the right kidney, spanning 6.5 x 5.4 x 5.3 cm. There are multiple new cysts an enlarging cysts in that region. The largest cyst measured 4.4 x 2.8 x 2.3 cm, currently measuring 5.9 x 3.1 x 2.5 cm. No solid components are visualized. Also demonstrated is a 3.4 x 3.0 x 2.5 cm mid right renal cyst. No solid masses were seen by ultrasound.  RUS on 08/22/2018 revealeda complex/multiseptated cyst in the inferior pole of the right kidney with minimally thickened septation. There may be some vascularity to one of the septations. Further evaluation with MRI without and with contrast is recommended.  MRI Abd w/wo contrast on 09/28/2018 found the following. A 3.3 x 2.8 cm enhancing mass of the right kidney upper pole that was  relatively occult on prior imaging workups due to lack of contrast. 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 A 6.5 x 5.2 cm Bosniac category 75F cystic right renal hilum lesion extending into the lower pole with several internal septation, known calcification, larger than previously noted. Some calyceal dilation along upper margin. No definite nodularity.  PMH: Past Medical History:  Diagnosis Date  . COPD (chronic obstructive pulmonary disease) ([place])   . Hepatitis C   . Hx of cholecystectomy   . Hypothyroidism   . Kidney cysts   . Major depressive disorder    Was treated at facility in [place] ([facility]) years ago  . Stroke ([place])   . Tobacco abuse   . Vitamin D deficiency     Surgical History: Past Surgical History:  Procedure Laterality Date  . LAPAROSCOPIC CHOLECYSTECTOMY      Home Medications:  Allergies as of 10/06/2018   No Known Allergies     Medication List       Accurate as of October 06, 2018  4:57 PM. Always use your most recent med list.        albuterol 108 (90 Base) MCG/ACT inhaler Commonly known as:  PROVENTIL HFA;VENTOLIN HFA Inhale into the lungs every 6 (six) hours as needed for wheezing or shortness of breath.   alendronate 70 MG tablet Commonly known as:  FOSAMAX Take 70 mg by mouth once a week. Fridays   aspirin 325 MG tablet Take 325 mg daily by mouth.   atorvastatin 40 MG tablet Commonly known  as:  LIPITOR Take 40 mg daily by mouth.   benztropine 1 MG tablet Commonly known as:  COGENTIN Take 1 mg by mouth at bedtime.   BEVESPI AEROSPHERE 9-4.8 MCG/ACT Aero Generic drug:  Glycopyrrolate-Formoterol Inhale 2 puffs into the lungs 2 (two) times daily.   buPROPion 150 MG 24 hr tablet Commonly known as:  WELLBUTRIN XL Take 150 mg by mouth daily.   calcium carbonate 600 MG Tabs tablet Commonly known as:  OS-CAL Take 600 mg by mouth 2 (two) times daily with a meal.   cholecalciferol 1000 units  tablet Commonly known as:  VITAMIN D Take 1,000 Units by mouth daily.   citalopram 40 MG tablet Commonly known as:  CELEXA Take 40 mg by mouth daily.   clonazePAM 0.5 MG tablet Commonly known as:  KLONOPIN Take 0.5 mg by mouth daily.   clopidogrel 75 MG tablet Commonly known as:  PLAVIX Take 75 mg daily by mouth.   fluticasone 50 MCG/ACT nasal spray Commonly known as:  FLONASE Place 2 sprays into both nostrils daily.   GERI-LANTA 200-200-20 MG/5ML suspension Generic drug:  alum & mag hydroxide-simeth Take 15 mLs by mouth every 6 (six) hours as needed for indigestion or heartburn.   loratadine 10 MG tablet Commonly known as:  CLARITIN Take 10 mg by mouth daily.   polyethylene glycol packet Commonly known as:  MIRALAX / GLYCOLAX Take 17 g by mouth daily as needed for mild constipation or moderate constipation.   risperidone 4 MG tablet Commonly known as:  RISPERDAL Take 4 mg by mouth at bedtime.   traZODone 50 MG tablet Commonly known as:  DESYREL Take 1 tablet (50 mg total) by mouth at bedtime.       Allergies: No Known Allergies  Family History: Family History  Adopted: Yes  Problem Relation Age of Onset  . Diabetes Maternal Grandmother   . Prostate cancer Neg Hx   . Bladder Cancer Neg Hx   . Kidney cancer Neg Hx     Social History:  reports that he has been smoking cigarettes. He has a 19.50 pack-year smoking history. He has never used smokeless tobacco. He reports that he does not drink alcohol or use drugs.  ROS: UROLOGY Frequent Urination?: No Hard to postpone urination?: No Burning/pain with urination?: No Get up at night to urinate?: No Leakage of urine?: No Urine stream starts and stops?: No Trouble starting stream?: No Do you have to strain to urinate?: No Blood in urine?: No Urinary tract infection?: No Sexually transmitted disease?: No Injury to kidneys or bladder?: No Painful intercourse?: No Weak stream?: No Erection problems?:  No Penile pain?: No  Gastrointestinal Nausea?: No Vomiting?: No Indigestion/heartburn?: No Diarrhea?: No Constipation?: No  Constitutional Fever: No Night sweats?: No Weight loss?: No Fatigue?: No  Skin Skin rash/lesions?: No Itching?: No  Eyes Blurred vision?: No Double vision?: No  Ears/Nose/Throat Sore throat?: No Sinus problems?: No  Hematologic/Lymphatic Swollen glands?: No Easy bruising?: No  Cardiovascular Leg swelling?: No Chest pain?: No  Respiratory Cough?: No Shortness of breath?: No  Endocrine Excessive thirst?: No  Musculoskeletal Back pain?: No Joint pain?: No  Neurological Headaches?: No Dizziness?: No  Psychologic Depression?: No Anxiety?: No  Physical Exam: BP 119/82 (BP Location: Left Arm, Patient Position: Sitting, Cuff Size: Normal)   Pulse 77   Ht 5\' 7"  (1.702 m)   Wt 179 lb 12.8 oz (81.6 kg)   BMI 28.16 kg/m   Constitutional:  Alert and oriented, No acute  distress. He is accompanied with his legal guardian (sister), caregiver, and family member.   HEENT: [place] AT, moist mucus membranes.  Trachea midline, no masses. Cardiovascular: No clubbing, cyanosis, or edema. Respiratory: Normal respiratory effort, no increased work of breathing. Skin: No rashes, bruises or suspicious lesions. Neurologic: Grossly intact, no focal deficits, moving all 4 extremities. Psychiatric: Normal mood and affect.  Laboratory Data:  Component     Latest Ref Rng & Units 04/20/2017 05/14/2017 05/15/2017 05/19/2017  Creatinine     0.61 - 1.24 mg/dL 1.19 1.64 (H) 1.19 1.02   Component     Latest Ref Rng & Units 05/21/2017 07/27/2017  Creatinine     0.61 - 1.24 mg/dL 0.93 0.96    Pertinent Imaging: CLINICAL DATA:  Follow up right renal cyst found on CT scan of August 2019.  EXAM: MRI ABDOMEN WITHOUT AND WITH CONTRAST  TECHNIQUE: Multiplanar multisequence MR imaging of the abdomen was performed both before and after the administration of  intravenous contrast.  CONTRAST:  8 cc Gadavist  COMPARISON:  04/19/2018 and ultrasound of 08/23/2018  FINDINGS: Despite efforts by the technologist and patient, motion artifact is present on today's exam and could not be eliminated. This reduces exam sensitivity and specificity.  Lower chest: Unremarkable  Hepatobiliary: Cholecystectomy.  Otherwise unremarkable.  Pancreas:  Unremarkable  Spleen:  Unremarkable  Adrenals/Urinary Tract: Slightly thickened adrenal glands without discrete mass.  3.3 by 2.9 cm enhancing mass of the right kidney upper pole has low T2 signal characteristics, suspicious for papillary renal cell carcinoma. Precontrast signal intensity measurements are in the 130s and postcontrast signal intensity measurements on arterial phase images (which are the post-contrast series least affected by motion artifact) are in the 230 range.  The cystic lesion of concern along the right renal hilum in the right kidney lower pole measures 6.0 by 5.2 cm on image 23/9, and contains multiple internal septations but no overt nodularity. There potentially some small vessels tracking along the septations based on the coronal images such image 40/21. I suspect excreted contrast material settling into a fluid-level along the upper portion of this lesion example on image 40/19 and image 22/22, suggesting possible calyceal diverticulum along the margin of the lesion.  Several additional small but on cystic lesions are present in the kidneys.  Stomach/Bowel: Unremarkable  Vascular/Lymphatic:  No pathologic adenopathy identified.  Other:  No supplemental non-categorized findings.  Musculoskeletal: Degenerative endplate findings at M4-2 eccentric to the right. Mild levoconvex lumbar scoliosis.  IMPRESSION: 1. A 3.3 by 2.9 cm enhancing mass of the right kidney upper pole is present. This has low T2 signal characteristics and is probably a papillary renal  cell carcinoma. This lesion is been relatively occult on the prior imaging workups due to lack of IV contrast. 2. 6.5 by 5.2 cm cystic lesion the right renal hilum extending into the lower pole contains several internal septations and has known calcification. Going back and measuring this lesion on 04/19/2018 exam it measured about 5.5 cm in diameter. I would categorize this as a Bosniak category 75F cystic lesion. There is potentially some calyceal dilatation along the upper margin of the lesion. No definite nodularity although there are some vessels running along the septations. Motion artifact obscures some detail and may affect characterization of this lesion.   Electronically Signed   By: Van Clines M.D.   On: 09/28/2018 12:37  I have images independently and with pt.   Assessment & Plan:    1. Right renal mass/  Bosniak 26F cystic lesion MR abdomen w w/o contrast shows 3.3 by 2.9 cm enhancing mass of the right kidney upper pole 6.5 by 5.2 cm cystic lesion the right renal hilum extending into the lower pole contains several internal septations and has known calcification.  We discussed that the enhancing 3.3 cm renal masses concerning for possible papillary renal cell carcinoma which tends to be a more indolent type of tumor, less aggressive.  Differential diagnosis for this lesion was also discussed including benign etiologies.  Family meeting today with caretakers and legal guardian present to facilitate discussion.  Active surveillance vs cryotherapy were discussed; surgery was also discussed however given that there is 2 suspicious lesions including a large Bosniak 26F as well as the renal mass in question, nephrectomy would most strongly be considered and thus possibility for progression of chronic kidney disease.  In addition he is multiple medical comorbidities. Risk and benefits of each modality were discussed in detail.  After lengthy discussion, the patient and his  family are most interested in either surveillance or cryo ablation with biopsy at the time.  We discussed the risks and benefits as well as the typical procedure course.  The lesion does appear to be amenable to cryotherapy in an ideal location.  They understand that he would need surveillance postop to follow the Bosniak 26F lesion as well.  Will schedule with interventional radiology in [facility] for consultation.   [place] [address], [place], [place] [address] [phone]  I, Lucas Mallow, am acting as a scribe for Dr. Hollice Espy,  I have reviewed the above documentation for accuracy and completeness, and I agree with the above.   Hollice Espy, MD   I spent 25 min with this patient of which greater than 50% was spent in counseling and coordination of care with the patient.

## 2018-10-07 ENCOUNTER — Other Ambulatory Visit: Payer: Self-pay | Admitting: Radiology

## 2018-10-07 DIAGNOSIS — N2889 Other specified disorders of kidney and ureter: Secondary | ICD-10-CM

## 2018-10-13 ENCOUNTER — Ambulatory Visit
Admission: RE | Admit: 2018-10-13 | Discharge: 2018-10-13 | Disposition: A | Discharge status: Home / Self Care | Payer: Medicare Other | Source: Ambulatory Visit | Attending: Urology | Admitting: Urology

## 2018-10-13 ENCOUNTER — Encounter: Payer: Self-pay | Admitting: Radiology

## 2018-10-13 DIAGNOSIS — N2889 Other specified disorders of kidney and ureter: Secondary | ICD-10-CM

## 2018-10-13 HISTORY — PX: IR RADIOLOGIST EVAL & MGMT: IMG5224

## 2018-10-13 NOTE — Consult Note (Signed)
Chief Complaint: Patient was seen in consultation today for  Chief Complaint  Patient presents with  . Consult    Consult for Cryoablation of Right Renal Mass   at the request of [place]  Referring Physician(s): Brandon,Ashley  History of Present Illness:  Mario Liu is a 58 y.o. male with a suspicious right renal lesion.  Patient had a CT of the abdomen performed on 04/19/2018 and MRI on 09/28/2018.  MRI demonstrated a 3.3 x 2.9 cm enhancing lesion along the medial right kidney upper pole.  There is also a complex cystic lesion in the right renal hilum measuring up to 6.5 cm.  Patient was evaluated by Dr. Erlene Quan in urology.  Patient's past medical history is significant for Down syndrome, depressive disorder, COPD, stroke and hepatitis C.  In addition, the patient's sister, who is his power of attorney, states that he has a PFO and there has been concern about using general anesthesia in the past.  Patient is alert,  oriented and able to answer my questions but the patient was not asking any questions himself.  Conversation was mostly between the caretakers and the patient's sister who was on the telephone.  Dr. Erlene Quan discussed the possibilities of a right nephrectomy.  Concern for right nephrectomy is the patient's comorbidities and history of recent acute renal failure and possibility of progression to chronic kidney disease.  At this point, the patient and family are considering a percutaneous ablation versus surveillance.  Past Medical History:  Diagnosis Date  . COPD (chronic obstructive pulmonary disease) ([place])   . Hepatitis C   . Hx of cholecystectomy   . Hypothyroidism   . Kidney cysts   . Major depressive disorder    Was treated at facility in [place] [facility] years ago  . Stroke ([place])   . Tobacco abuse   . Vitamin D deficiency     Past Surgical History:  Procedure Laterality Date  . LAPAROSCOPIC CHOLECYSTECTOMY      Allergies: Patient has no known  allergies.  Medications: Prior to Admission medications   Medication Sig Start Date End Date Taking? Authorizing Provider  albuterol (PROVENTIL HFA;VENTOLIN HFA) 108 (90 Base) MCG/ACT inhaler Inhale into the lungs every 6 (six) hours as needed for wheezing or shortness of breath.   Yes [provider]  alendronate (FOSAMAX) 70 MG tablet Take 70 mg by mouth once a week. Fridays   Yes [provider]  atorvastatin (LIPITOR) 40 MG tablet Take 40 mg daily by mouth.   Yes [provider]  benztropine (COGENTIN) 1 MG tablet Take 1 mg by mouth at bedtime.   Yes [provider]  buPROPion (WELLBUTRIN XL) 150 MG 24 hr tablet Take 150 mg by mouth daily.    Yes [provider]  calcium carbonate (OS-CAL) 600 MG TABS tablet Take 600 mg by mouth 2 (two) times daily with a meal.    Yes [provider]  cholecalciferol (VITAMIN D) 1000 units tablet Take 1,000 Units by mouth daily.   Yes [provider]  clopidogrel (PLAVIX) 75 MG tablet Take 75 mg daily by mouth.   Yes [provider]  donepezil (ARICEPT) 5 MG tablet Take 5 mg by mouth at bedtime.   Yes [provider]  Glycopyrrolate-Formoterol (BEVESPI AEROSPHERE) 9-4.8 MCG/ACT AERO Inhale 2 puffs into the lungs 2 (two) times daily.   Yes [provider]  loratadine (CLARITIN) 10 MG tablet Take 10 mg by mouth daily.   Yes  [provider]  traZODone (DESYREL) 50 MG tablet Take 1 tablet (50 mg total) by mouth at bedtime. 03/11/18  Yes Wieting, Richard, MD  alum & mag hydroxide-[facility]) 200-200-20 MG/5ML suspension Take 15 mLs by mouth every 6 (six) hours as needed for indigestion or heartburn.    [provider]  aspirin 325 MG tablet Take 325 mg daily by mouth.    [provider]  citalopram (CELEXA) 40 MG tablet Take 40 mg by mouth daily.     [provider]  clonazePAM (KLONOPIN) 0.5 MG tablet Take 0.5 mg by mouth daily.      [provider]  fluticasone (FLONASE) 50 MCG/ACT nasal spray Place 2 sprays into both nostrils daily.     [provider]  polyethylene glycol (MIRALAX / GLYCOLAX) packet Take 17 g by mouth daily as needed for mild constipation or moderate constipation.     [provider]  risperidone (RISPERDAL) 4 MG tablet Take 4 mg by mouth at bedtime.     [provider]     Family History  Adopted: Yes  Problem Relation Age of Onset  . Diabetes Maternal Grandmother   . Prostate cancer Neg Hx   . Bladder Cancer Neg Hx   . Kidney cancer Neg Hx     Social History   Socioeconomic History  . Marital status: Single    Spouse name: Not on file  . Number of children: Not on file  . Years of education: Not on file  . Highest education level: Not on file  Occupational History  . Not on file  Social Needs  . Financial resource strain: Not on file  . Food insecurity:    Worry: Not on file    Inability: Not on file  . Transportation needs:    Medical: Not on file    Non-medical: Not on file  Tobacco Use  . Smoking status: Current Every Day Smoker    Packs/day: 0.50    Years: 39.00    Pack years: 19.50    Types: Cigarettes  . Smokeless tobacco: Never Used  . Tobacco comment: pt is still smoking  Substance and Sexual Activity  . Alcohol use: No  . Drug use: No  . Sexual activity: Not on file  Lifestyle  . Physical activity:    Days per week: Not on file    Minutes per session: Not on file  . Stress: Not on file  Relationships  . Social connections:    Talks on phone: Not on file    Gets together: Not on file    Attends religious service: Not on file    Active member of club or organization: Not on file    Attends meetings of clubs or organizations: Not on file    Relationship status: Not on file  Other Topics Concern  . Not on file  Social History Narrative  . Not on file      Review of Systems  Constitutional: Negative.   Respiratory:  Negative.   Cardiovascular: Negative.   Gastrointestinal: Negative.   Genitourinary: Negative.     Vital Signs: BP 131/90   Pulse 72   Temp 98 F (36.7 C) (Oral)   Resp 14   Ht 5\' 7"  (1.702 m)   Wt 81.2 kg   SpO2 99%   BMI 28.04 kg/m   Physical Exam Vitals signs reviewed.  Cardiovascular:     Rate and Rhythm: Normal rate and regular rhythm.  Pulmonary:  Breath sounds: Normal breath sounds.  Abdominal:     General: Abdomen is flat.     Palpations: Abdomen is soft.     Tenderness: There is no abdominal tenderness.  Neurological:     Mental Status: He is alert.          Imaging: Mr Abdomen W Wo Contrast  Result Date: 09/28/2018 CLINICAL DATA:  Follow up right renal cyst found on CT scan of August 2019. EXAM: MRI ABDOMEN WITHOUT AND WITH CONTRAST TECHNIQUE: Multiplanar multisequence MR imaging of the abdomen was performed both before and after the administration of intravenous contrast. CONTRAST:  8 cc Gadavist COMPARISON:  04/19/2018 and ultrasound of 08/23/2018 FINDINGS: Despite efforts by the technologist and patient, motion artifact is present on today's exam and could not be eliminated. This reduces exam sensitivity and specificity. Lower chest: Unremarkable Hepatobiliary: Cholecystectomy.  Otherwise unremarkable. Pancreas:  Unremarkable Spleen:  Unremarkable Adrenals/Urinary Tract: Slightly thickened adrenal glands without discrete mass. 3.3 by 2.9 cm enhancing mass of the right kidney upper pole has low T2 signal characteristics, suspicious for papillary renal cell carcinoma. Precontrast signal intensity measurements are in the 130s and postcontrast signal intensity measurements on arterial phase images (which are the post-contrast series least affected by motion artifact) are in the 230 range. The cystic lesion of concern along the right renal hilum in the right kidney lower pole measures 6.0 by 5.2 cm on image 23/9, and contains multiple internal septations but no  overt nodularity. There potentially some small vessels tracking along the septations based on the coronal images such image 40/21. I suspect excreted contrast material settling into a fluid-level along the upper portion of this lesion example on image 40/19 and image 22/22, suggesting possible calyceal diverticulum along the margin of the lesion. Several additional small but on cystic lesions are present in the kidneys. Stomach/Bowel: Unremarkable Vascular/Lymphatic:  No pathologic adenopathy identified. Other:  No supplemental non-categorized findings. Musculoskeletal: Degenerative endplate findings at F0-2 eccentric to the right. Mild levoconvex lumbar scoliosis. IMPRESSION: 1. A 3.3 by 2.9 cm enhancing mass of the right kidney upper pole is present. 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 This lesion is been relatively occult on the prior imaging workups due to lack of IV contrast. 2. 6.5 by 5.2 cm cystic lesion the right renal hilum extending into the lower pole contains several internal septations and has known calcification. Going back and measuring this lesion on 04/19/2018 exam it measured about 5.5 cm in diameter. I would categorize this as a Bosniak category 32F cystic lesion. There is potentially some calyceal dilatation along the upper margin of the lesion. No definite nodularity although there are some vessels running along the septations. Motion artifact obscures some detail and may affect characterization of this lesion. Electronically Signed   By: Van Clines M.D.   On: 09/28/2018 12:37    Labs:  CBC: Recent Labs    04/07/18 1320 05/04/18 1010 07/05/18 0756 08/03/18 1357  WBC 4.8 4.2 7.6 4.8  HGB 13.6 13.2 12.9* 12.9*  HCT 42.6 40.9 41.2 41.6  PLT 121* 108* 100* 106*    COAGS: No results for input(s): INR, APTT in the last 8760 hours.  BMP: Recent Labs    04/07/18 1320 07/05/18 0756 07/06/18 0621 08/03/18 1357  09/28/18 1023  NA 140 139 143 142  --   K 3.9 4.0 4.4 4.1  --   CL 100 103 112* 108  --   CO2 25  25 24 26   --   GLUCOSE 122* 128* 116* 114*  --   BUN 12 19 14 20   --   CALCIUM 9.9 9.4 8.6* 9.3  --   CREATININE 1.21 1.54* 0.97 0.98 1.30*  GFRNONAA 67 49* >60 >60  --   GFRAA 78 57* >60 >60  --     LIVER FUNCTION TESTS: Recent Labs    03/01/18 1847 03/08/18 1259 07/05/18 0756 08/03/18 1357  BILITOT 0.8 0.7 1.1 0.6  AST 25 28 30  35  ALT 21 26 26  39  ALKPHOS 47 53 46 60  PROT 7.2 8.8* 7.5 7.3  ALBUMIN 3.8 4.6 3.9 3.8    TUMOR MARKERS: No results for input(s): AFPTM, CEA, CA199, CHROMGRNA in the last 8760 hours.  Assessment and Plan:  59 year old male with suspicious 3.3 cm enhancing lesion in the right kidney upper pole.  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  There is also a Bosniak 37F cystic lesion in the right renal hilum.  Urology felt that the patient would likely need a right nephrectomy but patient's family would like to avoid nephrectomy at this time.  We discussed the options of surveillance versus percutaneous ablation.  We spent most of the time discussing image guided cryoablation of the lesion.  Explained that the procedure is generally performed with general anesthesia although it could be performed with moderate sedation.  Risks of the procedure include bleeding, infection, decrease in renal function, damage to adjacent structures and incomplete tumor treatment.  Patient's sister appears to have a very good understanding of the procedure.  Although the patient is able to answer all my questions, I do not think that he fully comprehends the situation and the procedure.  I reviewed the imaging and the right renal lesion is suitable for percutaneous cryoablation.  At this time, the patient and sister would like to pursue image guided cryoablation of the right renal lesion with a biopsy at the same time.  We did discuss the possibility  of performing a biopsy prior to the procedure they were comfortable with performing a biopsy at the same time of the cryoablation.  Patient's past medical history is significant for a stroke and he is on Plavix.  In addition, the patient appears to have a history of a PFO and some cardiology issues.  Patient will need to get cardiology clearance prior to his procedure for general anesthesia and clearance to stop the Plavix.  If the patient cannot have general anesthesia, we could consider trying the procedure with moderate sedation although that would be less desirable.  Plan for image guided cryoablation of the right renal lesion after cardiac clearance.  Patient and sister are also aware that there is a complex cystic lesion in the right renal hilum and he will need continued follow-up after the ablation to follow the cystic lesion and the ablation lesion.  Thank you for this interesting consult.  I greatly enjoyed meeting [employer] and look forward to participating in their care.  A copy of this report was sent to the requesting provider on this date.  Electronically Signed: Burman Riis 10/13/2018, 3:40 PM   I spent a total of  30 Minutes   in face to face in clinical consultation, greater than 50% of which was counseling/coordinating care for a right renal lesion.

## 2018-10-20 ENCOUNTER — Telehealth: Payer: Self-pay

## 2018-10-20 ENCOUNTER — Telehealth: Payer: Self-pay | Admitting: Cardiovascular Disease

## 2018-10-20 NOTE — Telephone Encounter (Signed)
° °  [facility] Pre-operative Risk Assessment    Request for surgical clearance:  1. What type of surgery is being performed?  Cryoablation  R Renal Mass   2. When is this surgery scheduled? TBD   3. What type of clearance is required (medical clearance vs. Pharmacy clearance to hold med vs. Both)? Both   4. Are there any medications that need to be held prior to surgery and how long? Plavix 5 days prior to procedure    5. Practice name and name of physician performing surgery?  [facility]   6. What is your office phone number [phone]    7.   What is your office fax number [phone]   8.   Anesthesia type (None, local, MAC, general) ? General Anesthesia   See also note in epic    Clarisse Gouge 10/20/2018, 4:32 PM  _________________________________________________________________   (provider comments below)

## 2018-10-20 NOTE — Telephone Encounter (Signed)
Received fax From [facility] requesting approval to hold plavix prior to a procedure. Per Dr. Tasia Catchings, patient is being seeing here for thrombocytopenia and PCP should be contacted for approval to hold plavix. Called Marcelyn Bruins, clinic coordinator, to notify her.

## 2018-10-22 NOTE — Telephone Encounter (Signed)
Called and spoke with a person form the center/facility and they confirmed that someone called earlier to remind Mario Liu of his upcoming appointment.

## 2018-10-22 NOTE — Telephone Encounter (Signed)
   Primary Cardiologist:Steven Caryl Comes, MD  Chart reviewed as part of pre-operative protocol coverage. Because of Dijon Ivey's past medical history and time since last visit, he/she will require a follow-up visit in order to better assess preoperative cardiovascular risk.  Per Epic, pt has been scheduled with Dr. Bettina Gavia next week for clearance. Please confirm appt.  Pre-op covering staff: - Please schedule appointment and call patient to inform them. - Please contact requesting surgeon's office via preferred method (i.e, phone, fax) to inform them of need for appointment prior to surgery.  If applicable, this message will also be routed to pharmacy pool and/or primary cardiologist for input on holding anticoagulant/antiplatelet agent as requested below so that this information is available at time of patient's appointment.   Tami Lin Duke, PA  10/22/2018, 2:26 PM

## 2018-10-27 ENCOUNTER — Ambulatory Visit: Payer: Medicare Other | Admitting: Cardiology

## 2018-10-27 ENCOUNTER — Encounter: Payer: Self-pay | Admitting: Cardiology

## 2018-10-27 ENCOUNTER — Ambulatory Visit (INDEPENDENT_AMBULATORY_CARE_PROVIDER_SITE_OTHER): Payer: Medicare Other | Admitting: Cardiology

## 2018-10-27 VITALS — BP 108/70 | HR 74 | Ht 67.0 in | Wt 180.0 lb

## 2018-10-27 DIAGNOSIS — Q211 Atrial septal defect: Secondary | ICD-10-CM

## 2018-10-27 DIAGNOSIS — Z01810 Encounter for preprocedural cardiovascular examination: Secondary | ICD-10-CM | POA: Diagnosis not present

## 2018-10-27 DIAGNOSIS — E782 Mixed hyperlipidemia: Secondary | ICD-10-CM

## 2018-10-27 DIAGNOSIS — Q2112 Patent foramen ovale: Secondary | ICD-10-CM

## 2018-10-27 DIAGNOSIS — E785 Hyperlipidemia, unspecified: Secondary | ICD-10-CM | POA: Insufficient documentation

## 2018-10-27 MED ORDER — ASPIRIN EC 81 MG PO TBEC: 81.0000 mg | DELAYED_RELEASE_TABLET | Freq: Every day | ORAL | 3 refills | Status: DC

## 2018-10-27 NOTE — Progress Notes (Signed)
Cardiology Office Note:    Date:  10/27/2018   ID:  Mario Liu, DOB 1960-11-18, MRN [id number]  PCP:  [name], FNP  Cardiologist:  Shirlee More, MD   Referring MD: [name], FNP  ASSESSMENT:    1. PFO with atrial septal aneurysm   2. Preoperative cardiovascular examination   3. Mixed hyperlipidemia    PLAN:    In order of problems listed above:  1. Plan procedure is low risk interventional radiology.  The patient has stable patent foramen ovale and hyperlipidemia.  I reviewed the previous cardiac notes and decision was made not to pursue closure at the time of the stroke.  Since then he is done well he has had no chest pain shortness of breath arrhythmia syncope or recurrent neurologic event and is been treated with dual antiplatelet therapy.  I do not think he requires any further preoperative cardiology testing his EKG today shows sinus rhythm and insignificant Q waves, normal variant.  He can have the procedure as an outpatient if any issues arise please contact heart care.  He withdraws antiplatelet therapy around the time of the procedure I would reinstitute low-dose aspirin 24 to 48 hours afterwards and can restart clopidogrel when safe from a bleeding perspective usually 2 to 4 days. 2. Stable continue antiplatelet therapy unless he has recurrence I would not consider closure 3. Stable continue his high intensity status his care aide tells me his labs are being followed with a primary care physician outside of epic and is up-to-date  Next appointment 1 year to follow-up for his patent foramen ovale   Medication Adjustments/Labs and Tests Ordered: Current medicines are reviewed at length with the patient today.  Concerns regarding medicines are outlined above.  Orders Placed This Encounter  Procedures  . EKG 12-Lead   No orders of the defined types were placed in this encounter.    Chief Complaint  Patient presents with  . Pre-op Exam    History of  Present Illness:    Lenox Bink is a 58 y.o. male with Bosniak 44F cystic lesion in right kidney stroke in August 2018 and small PFO with atrial septal aneurysmwho is being seen today fopreoperative cardiac evaluationat the request of Dr Anselm Pancoast.  He has a right renal mass presumed cancer and is being scheduled for interventional radiology ablation  Echo 05/15/17:   Study Conclusions  - Left ventricle: The cavity size was normal. Wall thickness was   normal. Systolic function was normal. The estimated ejection   fraction was in the range of 55% to 60%. - Mitral valve: There was mild regurgitation. - Left atrium: The atrium was mildly dilated. - Atrial septum: With injection of agitated saline with cough there   were bubbles seen in left sided chambers consistent with small   PFO/ASD. Interatrial septum is hypermobile.  He is compliant with his medications and has had no cardiac symptoms of chest pain shortness of breath palpitation or syncope and is pending interventional radiology ablation renal mass.  He can safely withdraw dual antiplatelet therapy for 5 to 7 days before the procedure start aspirin 24 to 48 hours and resume clopidogrel and stable.  He requires no further preoperative evaluation and anticipates the procedure as an outpatient.  I do not think his patent foramen ovale puts him at any increased risk Past Medical History:  Diagnosis Date  . Acute ischemic left MCA stroke ([place])   . Altered mental status 03/09/2018  . COPD (chronic obstructive  pulmonary disease) ([place])   . Down's syndrome   . Hepatitis C   . Hepatitis C antibody test positive 05/16/2017  . Hx of cholecystectomy   . Hypothyroidism   . Kidney cysts   . Major depressive disorder    Was treated at facility in [place] ([facility]) years ago  . PFO with atrial septal aneurysm   . Recurrent falls 03/01/2018  . Seizure disorder ([place]) 11/24/2017  . Stroke ([place])   . Tobacco abuse   . Vitamin D deficiency     Past  Surgical History:  Procedure Laterality Date  . IR RADIOLOGIST EVAL & MGMT  10/13/2018  . LAPAROSCOPIC CHOLECYSTECTOMY      Current Medications: Current Meds  Medication Sig  . albuterol (PROVENTIL HFA;VENTOLIN HFA) 108 (90 Base) MCG/ACT inhaler Inhale into the lungs every 6 (six) hours as needed for wheezing or shortness of breath.  Marland Kitchen alendronate (FOSAMAX) 70 MG tablet Take 70 mg by mouth once a week. Fridays  . alum & mag hydroxide-simeth (GERI-LANTA) 200-200-20 MG/5ML suspension Take 15 mLs by mouth every 6 (six) hours as needed for indigestion or heartburn.  Marland Kitchen aspirin 325 MG tablet Take 325 mg daily by mouth.  Marland Kitchen atorvastatin (LIPITOR) 40 MG tablet Take 40 mg daily by mouth.  . benztropine (COGENTIN) 1 MG tablet Take 1 mg by mouth at bedtime.  Marland Kitchen buPROPion (WELLBUTRIN XL) 150 MG 24 hr tablet Take 150 mg by mouth daily.   . calcium carbonate (OS-CAL) 600 MG TABS tablet Take 600 mg by mouth 2 (two) times daily with a meal.   . cholecalciferol (VITAMIN D) 1000 units tablet Take 1,000 Units by mouth daily.  . citalopram (CELEXA) 40 MG tablet Take 40 mg by mouth daily.   . clopidogrel (PLAVIX) 75 MG tablet Take 75 mg daily by mouth.  . donepezil (ARICEPT) 5 MG tablet Take 5 mg by mouth at bedtime.  . fluticasone (FLONASE) 50 MCG/ACT nasal spray Place 2 sprays into both nostrils daily.   . Glycopyrrolate-Formoterol (BEVESPI AEROSPHERE) 9-4.8 MCG/ACT AERO Inhale 2 puffs into the lungs 2 (two) times daily.  Marland Kitchen lactose free nutrition (BOOST) LIQD Take 237 mLs by mouth 2 (two) times daily between meals.  Marland Kitchen loratadine (CLARITIN) 10 MG tablet Take 10 mg by mouth daily.  . polyethylene glycol (MIRALAX / GLYCOLAX) packet Take 17 g by mouth daily as needed for mild constipation or moderate constipation.   . risperidone (RISPERDAL) 4 MG tablet Take 4 mg by mouth at bedtime.   . traZODone (DESYREL) 50 MG tablet Take 1 tablet (50 mg total) by mouth at bedtime.   Current Facility-Administered Medications  for the 10/27/18 encounter (Office Visit) with Richardo Priest, MD  Medication  . lacosamide (VIMPAT) tablet 200 mg     Allergies:   Patient has no known allergies.   Social History   Socioeconomic History  . Marital status: Single    Spouse name: Not on file  . Number of children: Not on file  . Years of education: Not on file  . Highest education level: Not on file  Occupational History  . Not on file  Social Needs  . Financial resource strain: Not on file  . Food insecurity:    Worry: Not on file    Inability: Not on file  . Transportation needs:    Medical: Not on file    Non-medical: Not on file  Tobacco Use  . Smoking status: Current Some Day Smoker  Packs/day: 0.50    Years: 39.00    Pack years: 19.50    Types: Cigarettes  . Smokeless tobacco: Never Used  . Tobacco comment: pt is still smoking  Substance and Sexual Activity  . Alcohol use: No  . Drug use: No  . Sexual activity: Not on file  Lifestyle  . Physical activity:    Days per week: Not on file    Minutes per session: Not on file  . Stress: Not on file  Relationships  . Social connections:    Talks on phone: Not on file    Gets together: Not on file    Attends religious service: Not on file    Active member of club or organization: Not on file    Attends meetings of clubs or organizations: Not on file    Relationship status: Not on file  Other Topics Concern  . Not on file  Social History Narrative  . Not on file     Family History: The patient's family history includes Diabetes in his maternal grandmother; Heart attack in his maternal aunt. There is no history of Prostate cancer, Bladder Cancer, or Kidney cancer. He was adopted.  ROS:   ROS Please see the history of present illness.     All other systems reviewed and are negative.  EKGs/Labs/Other Studies Reviewed:    The following studies were reviewed today:   EKG:  EKG is  ordered today.  The ekg ordered today demonstrates [place]  Normal inferior Q waves are small and insignificant and  nonspecific  Recent Labs: 03/08/2018: B Natriuretic Peptide 15.0 08/03/2018: ALT 39; BUN 20; Hemoglobin 12.9; Platelets 106; Potassium 4.1; Sodium 142 09/28/2018: Creatinine, Ser 1.30  Recent Lipid Panel    Component Value Date/Time   CHOL 138 05/15/2017 0433   TRIG 125 05/15/2017 0433   HDL 33 (L) 05/15/2017 0433   CHOLHDL 4.2 05/15/2017 0433   VLDL 25 05/15/2017 0433   LDLCALC 80 05/15/2017 0433    Physical Exam:    VS:  BP 108/70 (BP Location: Left Arm, Patient Position: Sitting, Cuff Size: Normal)   Pulse 74   Ht 5\' 7"  (1.702 m)   Wt 180 lb (81.6 kg)   SpO2 92%   BMI 28.19 kg/m     Wt Readings from Last 3 Encounters:  10/27/18 180 lb (81.6 kg)  10/13/18 179 lb (81.2 kg)  10/06/18 179 lb 12.8 oz (81.6 kg)     GEN:  Well nourished, well developed in no acute distress HEENT: Normal NECK: No JVD; No carotid bruits LYMPHATICS: No lymphadenopathy CARDIAC: small stature RRR, no murmurs, rubs, gallops RESPIRATORY:  Clear to auscultation without rales, wheezing or rhonchi  ABDOMEN: Soft, non-tender, non-distended MUSCULOSKELETAL:  No edema; No deformity  SKIN: Warm and dry NEUROLOGIC:  Alert and oriented x 3 PSYCHIATRIC:  Normal affect     Signed, Shirlee More, MD  10/27/2018 2:11 PM    [facility]

## 2018-10-27 NOTE — Patient Instructions (Addendum)
Medication Instructions:  Your physician has recommended you make the following change in your medication:  DECREASE aspirin dose from 325mg  to 81 mg per day.  If you need a refill on your cardiac medications before your next appointment, please call your pharmacy.   Lab work: NONE   Testing/Procedures: You had an EKG performed today.  Follow-Up: At [facility], you and your health needs are our priority.  As part of our continuing mission to provide you with exceptional heart care, we have created designated Provider Care Teams.  These Care Teams include your primary Cardiologist (physician) and Advanced Practice Providers (APPs -  Physician Assistants and Nurse Practitioners) who all work together to provide you with the care you need, when you need it. You will need a follow up appointment in 1 years.  Please call our office 2 months in advance to schedule this appointment.

## 2018-11-12 ENCOUNTER — Other Ambulatory Visit (HOSPITAL_COMMUNITY): Payer: Self-pay | Admitting: Diagnostic Radiology

## 2018-11-12 DIAGNOSIS — N2889 Other specified disorders of kidney and ureter: Secondary | ICD-10-CM

## 2018-11-15 ENCOUNTER — Other Ambulatory Visit: Payer: Self-pay | Admitting: Radiology

## 2018-11-19 ENCOUNTER — Encounter (HOSPITAL_COMMUNITY): Payer: Self-pay

## 2018-11-19 NOTE — Pre-Procedure Instructions (Signed)
The following are in epic: Cardiac clearance and last office visit note Dr. Bettina Gavia 10/27/2018 EKG 10/27/2018 CXR 07/05/2018 ECHO 05/15/2017

## 2018-11-19 NOTE — Patient Instructions (Signed)
Your procedure is scheduled on: Wednesday, November 24, 2018   Surgery Time:  8:30AM-11:30AM   Report to [facility] Main  Entrance    Report to admitting at 6:30 AM   Call this number if you have problems the morning of surgery [phone]   Do not eat food or drink liquids :After Midnight.   Brush your teeth the morning of surgery.   Do NOT smoke after Midnight   Take these medicines the morning of surgery with A SIP OF WATER: Atorvastatin, Loratidine, Bupropion, Citalopram, Vimpat   Use Inhaler and Flonase per normal routine day of surgery   Bring Inhaler day of surgery                               You may not have any metal on your body including jewelry, and body piercings             Do not wear lotions, powders, perfumes/cologne, or deodorant                           Men may shave face and neck.   Do not bring valuables to the hospital. [place].   Contacts, dentures or bridgework may not be worn into surgery.   Leave suitcase in the car. After surgery it may be brought to your room.    Special Instructions: Bring a copy of your healthcare power of attorney and living will documents         the day of surgery if you haven't scanned them in before.              Please read over the following fact sheets you were given:  [facility] - Preparing for Surgery Before surgery, you can play an important role.  Because skin is not sterile, your skin needs to be as free of germs as possible.  You can reduce the number of germs on your skin by washing with CHG (chlorahexidine gluconate) soap before surgery.  CHG is an antiseptic cleaner which kills germs and bonds with the skin to continue killing germs even after washing. Please DO NOT use if you have an allergy to CHG or antibacterial soaps.  If your skin becomes reddened/irritated stop using the CHG and inform your nurse when you arrive at Short Stay. Do not  shave (including legs and underarms) for at least 48 hours prior to the first CHG shower.  You may shave your face/neck.  Please follow these instructions carefully:  1.  Shower with CHG Soap the night before surgery and the  morning of surgery.  2.  If you choose to wash your hair, wash your hair first as usual with your normal  shampoo.  3.  After you shampoo, rinse your hair and body thoroughly to remove the shampoo.                             4.  Use CHG as you would any other liquid soap.  You can apply chg directly to the skin and wash.  Gently with a scrungie or clean washcloth.  5.  Apply the CHG Soap to your body ONLY FROM THE NECK DOWN.   Do   not  use on face/ open                           Wound or open sores. Avoid contact with eyes, ears mouth and   genitals (private parts).                       Wash face,  Genitals (private parts) with your normal soap.             6.  Wash thoroughly, paying special attention to the area where your    surgery  will be performed.  7.  Thoroughly rinse your body with warm water from the neck down.  8.  DO NOT shower/wash with your normal soap after using and rinsing off the CHG Soap.                9.  Pat yourself dry with a clean towel.            10.  Wear clean pajamas.            11.  Place clean sheets on your bed the night of your first shower and do not  sleep with pets. Day of Surgery : Do not apply any lotions/deodorants the morning of surgery.  Please wear clean clothes to the hospital/surgery center.  FAILURE TO FOLLOW THESE INSTRUCTIONS MAY RESULT IN THE CANCELLATION OF YOUR SURGERY  PATIENT SIGNATURE_________________________________  NURSE SIGNATURE__________________________________  ________________________________________________________________________  WHAT IS A BLOOD TRANSFUSION? Blood Transfusion Information  A transfusion is the replacement of blood or some of its parts. Blood is made up of multiple cells which provide  different functions.  Red blood cells carry oxygen and are used for blood loss replacement.  White blood cells fight against infection.  Platelets control bleeding.  Plasma helps clot blood.  Other blood products are available for specialized needs, such as hemophilia or other clotting disorders. BEFORE THE TRANSFUSION  Who gives blood for transfusions?   Healthy volunteers who are fully evaluated to make sure their blood is safe. This is blood bank blood. Transfusion therapy is the safest it has ever been in the practice of medicine. Before blood is taken from a donor, a complete history is taken to make sure that person has no history of diseases nor engages in risky social behavior (examples are intravenous drug use or sexual activity with multiple partners). The donor's travel history is screened to minimize risk of transmitting infections, such as malaria. The donated blood is tested for signs of infectious diseases, such as HIV and hepatitis. The blood is then tested to be sure it is compatible with you in order to minimize the chance of a transfusion reaction. If you or a relative donates blood, this is often done in anticipation of surgery and is not appropriate for emergency situations. It takes many days to process the donated blood. RISKS AND COMPLICATIONS Although transfusion therapy is very safe and saves many lives, the main dangers of transfusion include:   Getting an infectious disease.  Developing a transfusion reaction. This is an allergic reaction to something in the blood you were given. Every precaution is taken to prevent this. The decision to have a blood transfusion has been considered carefully by your caregiver before blood is given. Blood is not given unless the benefits outweigh the risks. AFTER THE TRANSFUSION  Right after receiving a blood transfusion, you will usually feel much better and more energetic.  This is especially true if your red blood cells have  gotten low (anemic). The transfusion raises the level of the red blood cells which carry oxygen, and this usually causes an energy increase.  The nurse administering the transfusion will monitor you carefully for complications. HOME CARE INSTRUCTIONS  No special instructions are needed after a transfusion. You may find your energy is better. Speak with your caregiver about any limitations on activity for underlying diseases you may have. SEEK MEDICAL CARE IF:   Your condition is not improving after your transfusion.  You develop redness or irritation at the intravenous (IV) site. SEEK IMMEDIATE MEDICAL CARE IF:  Any of the following symptoms occur over the next 12 hours:  Shaking chills.  You have a temperature by mouth above 102 F (38.9 C), not controlled by medicine.  Chest, back, or muscle pain.  People around you feel you are not acting correctly or are confused.  Shortness of breath or difficulty breathing.  Dizziness and fainting.  You get a rash or develop hives.  You have a decrease in urine output.  Your urine turns a dark color or changes to pink, red, or brown. Any of the following symptoms occur over the next 10 days:  You have a temperature by mouth above 102 F (38.9 C), not controlled by medicine.  Shortness of breath.  Weakness after normal activity.  The white part of the eye turns yellow (jaundice).  You have a decrease in the amount of urine or are urinating less often.  Your urine turns a dark color or changes to pink, red, or brown. Document Released: 08/29/2000 Document Revised: 11/24/2011 Document Reviewed: 04/17/2008 [facility] Patient Information 2014 [place], [place].  _______________________________________________________________________

## 2018-11-22 ENCOUNTER — Other Ambulatory Visit: Payer: Self-pay | Admitting: Radiology

## 2018-11-22 ENCOUNTER — Encounter (HOSPITAL_COMMUNITY): Payer: Self-pay

## 2018-11-22 ENCOUNTER — Encounter (INDEPENDENT_AMBULATORY_CARE_PROVIDER_SITE_OTHER): Payer: Self-pay

## 2018-11-22 ENCOUNTER — Other Ambulatory Visit: Payer: Self-pay

## 2018-11-22 ENCOUNTER — Encounter (HOSPITAL_COMMUNITY)
Admission: RE | Admit: 2018-11-22 | Discharge: 2018-11-22 | Disposition: A | Discharge status: Home / Self Care | Payer: Medicare Other | Source: Ambulatory Visit | Attending: Diagnostic Radiology | Admitting: Diagnostic Radiology

## 2018-11-22 ENCOUNTER — Ambulatory Visit (HOSPITAL_COMMUNITY)
Admission: RE | Admit: 2018-11-22 | Discharge: 2018-11-22 | Disposition: A | Discharge status: Home / Self Care | Payer: Medicare Other | Source: Ambulatory Visit | Attending: Radiology | Admitting: Radiology

## 2018-11-22 DIAGNOSIS — Z01818 Encounter for other preprocedural examination: Secondary | ICD-10-CM | POA: Diagnosis present

## 2018-11-22 HISTORY — DX: Other intervertebral disc degeneration, lumbar region: M51.36

## 2018-11-22 HISTORY — DX: Spondylosis without myelopathy or radiculopathy, lumbar region: M47.816

## 2018-11-22 HISTORY — DX: Synovial cyst of popliteal space (Baker), left knee: M71.22

## 2018-11-22 HISTORY — DX: Gastro-esophageal reflux disease without esophagitis: K21.9

## 2018-11-22 HISTORY — DX: Cyst of kidney, acquired: N28.1

## 2018-11-22 HISTORY — DX: Other specified disorders of kidney and ureter: N28.89

## 2018-11-22 HISTORY — DX: Thrombocytopenia, unspecified: D69.6

## 2018-11-22 HISTORY — DX: Other intervertebral disc degeneration, lumbar region without mention of lumbar back pain or lower extremity pain: M51.369

## 2018-11-22 HISTORY — DX: Nausea with vomiting, unspecified: R11.2

## 2018-11-22 HISTORY — DX: Other specified postprocedural states: Z98.890

## 2018-11-22 LAB — CBC WITH DIFFERENTIAL/PLATELET
Abs Immature Granulocytes: 0 10*3/uL (ref 0.00–0.07)
Basophils Absolute: 0 10*3/uL (ref 0.0–0.1)
Basophils Relative: 1 %
EOS ABS: 0.3 10*3/uL (ref 0.0–0.5)
Eosinophils Relative: 7 %
HCT: 44.3 % (ref 39.0–52.0)
Hemoglobin: 13.7 g/dL (ref 13.0–17.0)
Immature Granulocytes: 0 %
Lymphocytes Relative: 43 %
Lymphs Abs: 1.5 10*3/uL (ref 0.7–4.0)
MCH: 29 pg (ref 26.0–34.0)
MCHC: 30.9 g/dL (ref 30.0–36.0)
MCV: 93.9 fL (ref 80.0–100.0)
Monocytes Absolute: 0.4 10*3/uL (ref 0.1–1.0)
Monocytes Relative: 10 %
Neutro Abs: 1.4 10*3/uL — ABNORMAL LOW (ref 1.7–7.7)
Neutrophils Relative %: 39 %
Platelets: 88 10*3/uL — ABNORMAL LOW (ref 150–400)
RBC: 4.72 MIL/uL (ref 4.22–5.81)
RDW: 12.5 % (ref 11.5–15.5)
WBC: 3.6 10*3/uL — ABNORMAL LOW (ref 4.0–10.5)
nRBC: 0 % (ref 0.0–0.2)

## 2018-11-22 LAB — ABO/RH: ABO/RH(D): B POS

## 2018-11-22 LAB — BASIC METABOLIC PANEL
Anion gap: 6 (ref 5–15)
BUN: 19 mg/dL (ref 6–20)
CALCIUM: 9.3 mg/dL (ref 8.9–10.3)
CO2: 28 mmol/L (ref 22–32)
Chloride: 107 mmol/L (ref 98–111)
Creatinine, Ser: 0.95 mg/dL (ref 0.61–1.24)
GFR calc Af Amer: >60 mL/min (ref >60)
GFR calc non Af Amer: >60 mL/min (ref >60)
Glucose, Bld: 120 mg/dL — ABNORMAL HIGH (ref 70–99)
Potassium: 3.9 mmol/L (ref 3.5–5.1)
Sodium: 141 mmol/L (ref 135–145)

## 2018-11-22 LAB — PROTIME-INR
INR: 1 (ref 0.8–1.2)
Prothrombin Time: 13.3 seconds (ref 11.4–15.2)

## 2018-11-22 NOTE — Pre-Procedure Instructions (Signed)
Last dose of Plvix on November 18, 2018 instructed by Dr. Markus Daft per patient.  Chart sent to [facility] for review History of PFO atrial septal aneursym, Stroke, COPD, Down's Syndrome, and Seizure disorder.  Platelets 88 11/22/2018.  CBC/diff sent to Dr. Anselm Pancoast via epic.

## 2018-11-23 ENCOUNTER — Other Ambulatory Visit: Payer: Self-pay | Admitting: Radiology

## 2018-11-23 NOTE — Progress Notes (Signed)
Anesthesia Chart Review   Case:  [id number] Date/Time:  11/24/18 0815   Procedure:  CT WITH ANESTHESIA RENAL CRYOABLATION WITH BIOPSY (Right )   Anesthesia type:  General   Pre-op diagnosis:  RIGHT RENAL MASS   Location:  [facility] / [facility]   Surgeon:  Markus Daft, MD      DISCUSSION:58 yo current some day smoker (19.5 pack years) with h/o Down's Syndrome, PONV, COPD, PFO with atrial septal aneurysm, thrombocytopenia (etiology unknown, followed by hematology/oncology), depression, hypothyroidism, GERD, right renal mass scheduled for above procedure 11/24/18 with Dr. Markus Daft.   Pt reported at PAT visit his last dose of Plavix 11/18/18 as instructed by Dr. Anselm Pancoast.   Pt last seen by cardiologist, Dr. Shirlee More, on 10/27/18.  Per his note, "Plan procedure is low risk interventional radiology.  The patient has stable patent foramen ovale and hyperlipidemia.  I reviewed the previous cardiac notes and decision was made not to pursue closure at the time of the stroke.  Since then he is done well he has had no chest pain shortness of breath arrhythmia syncope or recurrent neurologic event and is been treated with dual antiplatelet therapy.  I do not think he requires any further preoperative cardiology testing his EKG today shows sinus rhythm and insignificant Q waves, normal variant.  He can have the procedure as an outpatient if any issues arise please contact heart care.  He withdraws antiplatelet therapy around the time of the procedure I would reinstitute low-dose aspirin 24 to 48 hours afterwards and can restart clopidogrel when safe from a bleeding perspective usually 2 to 4 days."    Pt can proceed with planned procedure barring acute status change.  VS: BP 110/79   Pulse 70   Temp 36.6 C (Oral)   Resp 16   Ht 5\' 6"  (1.676 m)   Wt 81.8 kg   SpO2 100%   BMI 29.11 kg/m   PROVIDERS: [name], FNP is PCP   Shirlee More, MD is Cardiologist  Earlie Server, MD with hematology/oncology   LABS:  Labs reviewed: Acceptable for surgery. (all labs ordered are listed, but only abnormal results are displayed)  Labs Reviewed  BASIC METABOLIC PANEL - Abnormal; Notable for the following components:      Result Value   Glucose, Bld 120 (*)    All other components within normal limits  CBC WITH DIFFERENTIAL/PLATELET - Abnormal; Notable for the following components:   WBC 3.6 (*)    Platelets 88 (*)    Neutro Abs 1.4 (*)    All other components within normal limits  PROTIME-INR  TYPE AND SCREEN  ABO/RH     IMAGES: Chest Xray 11/22/2018 FINDINGS: The heart size and mediastinal contours are within normal limits. Normal pulmonary vascularity. No focal consolidation, pleural effusion, or pneumothorax. No acute osseous abnormality.  IMPRESSION: No active disease.  EKG: 10/27/18 Rate 74 bpm Normal sinus rhythm  Possible inferior infarct, age undetermined  CV: Echo 05/15/17:   Study Conclusions  - Left ventricle: The cavity size was normal. Wall thickness was normal. Systolic function was normal. The estimated ejection fraction was in the range of 55% to 60%. - Mitral valve: There was mild regurgitation. - Left atrium: The atrium was mildly dilated. - Atrial septum: With injection of agitated saline with cough there were bubbles seen in left sided chambers consistent with small PFO/ASD. Interatrial septum is hypermobile. Past Medical History:  Diagnosis Date  . Acute ischemic left MCA stroke (  [place])   . Altered mental status 03/09/2018  . Baker's cyst of knee, left 2018  . Bilateral renal cysts   . COPD (chronic obstructive pulmonary disease) ([place])   . DDD (degenerative disc disease), lumbar   . Down's syndrome   . GERD (gastroesophageal reflux disease)   . Hepatitis C   . Hepatitis C antibody test positive 05/16/2017  . Hx of cholecystectomy   . Hypothyroidism   . Kidney cysts   . Lumbar spondylosis   . Major depressive disorder    Was treated at facility in  [place] ([facility]) years ago  . PFO with atrial septal aneurysm   . PONV (postoperative nausea and vomiting)   . Recurrent falls 03/01/2018  . Right kidney mass   . Seizure disorder ([place]) 11/24/2017  . Thrombocytopenia ([place])   . Tobacco abuse   . Vitamin D deficiency     Past Surgical History:  Procedure Laterality Date  . IR RADIOLOGIST EVAL & MGMT  10/13/2018  . LAPAROSCOPIC CHOLECYSTECTOMY      MEDICATIONS: . acetaminophen (TYLENOL) 325 MG tablet  . albuterol (PROVENTIL HFA;VENTOLIN HFA) 108 (90 Base) MCG/ACT inhaler  . alendronate (FOSAMAX) 70 MG tablet  . alum & mag hydroxide-simeth (GERI-LANTA) 200-200-20 MG/5ML suspension  . aspirin 325 MG tablet  . aspirin EC 81 MG tablet  . atorvastatin (LIPITOR) 40 MG tablet  . benztropine (COGENTIN) 1 MG tablet  . buPROPion (WELLBUTRIN XL) 150 MG 24 hr tablet  . calcium carbonate (OS-CAL) 600 MG TABS tablet  . cholecalciferol (VITAMIN D) 1000 units tablet  . citalopram (CELEXA) 40 MG tablet  . clonazePAM (KLONOPIN) 0.5 MG tablet  . clopidogrel (PLAVIX) 75 MG tablet  . Dextromethorphan-guaiFENesin (ROBAFEN DM) 10-100 MG/5ML liquid  . dimenhyDRINATE (DRAMAMINE) 50 MG tablet  . donepezil (ARICEPT) 5 MG tablet  . fluticasone (FLONASE) 50 MCG/ACT nasal spray  . food thickener (THICK IT) POWD  . Glycopyrrolate-Formoterol (BEVESPI AEROSPHERE) 9-4.8 MCG/ACT AERO  . guaiFENesin (ROBITUSSIN) 100 MG/5ML liquid  . lactose free nutrition (BOOST) LIQD  . loperamide (IMODIUM) 2 MG capsule  . loratadine (CLARITIN) 10 MG tablet  . magnesium hydroxide (MILK OF MAGNESIA) 400 MG/5ML suspension  . phenol (CHLORASEPTIC) 1.4 % LIQD  . polyethylene glycol (MIRALAX / GLYCOLAX) packet  . risperidone (RISPERDAL) 4 MG tablet  . traZODone (DESYREL) 50 MG tablet   . lacosamide (VIMPAT) tablet 200 mg     Maia Plan [facility] Pre-Surgical Testing [phone] 11/23/18 10:13 AM

## 2018-11-23 NOTE — Anesthesia Preprocedure Evaluation (Addendum)
Anesthesia Evaluation  Patient identified by MRN, date of birth, ID band Patient awake    Reviewed: Allergy & Precautions, NPO status , Patient's Chart, lab work & pertinent test results  History of Anesthesia Complications (+) PONV and history of anesthetic complications  Airway Mallampati: IV  TM Distance: >3 FB Neck ROM: Full  Mouth opening: Limited Mouth Opening  Dental  (+) Teeth Intact   Pulmonary COPD, Current Smoker,    breath sounds clear to auscultation       Cardiovascular  Rhythm:Regular  PFO   Neuro/Psych Seizures -,  PSYCHIATRIC DISORDERS Depression CVA    GI/Hepatic GERD  ,(+) Hepatitis -, C  Endo/Other  Hypothyroidism Morbid obesity  Renal/GU Renal disease     Musculoskeletal  (+) Arthritis ,   Abdominal   Peds  Hematology   Anesthesia Other Findings 58 yo current some day smoker (19.5 pack years) with h/o Down's Syndrome, PONV, COPD, PFO with atrial septal aneurysm, thrombocytopenia (etiology unknown, followed by hematology/oncology), depression, hypothyroidism, GERD, right renal mass scheduled for above procedure 11/24/18 with Dr. Markus Daft.   Pt reported at PAT visit his last dose of Plavix 11/18/18 as instructed by Dr. Anselm Pancoast.   Pt last seen by cardiologist, Dr. Shirlee More, on 10/27/18.  Per his note, "Plan procedure is low risk interventional radiology. The patient has stable patent foramen ovale and hyperlipidemia. I reviewed the previous cardiac notes and decision was made not to pursue closure at the time of the stroke. Since then he is done well he has had no chest pain shortness of breath arrhythmia syncope or recurrent neurologic event and is been treated with dual antiplatelet therapy. I do not think he requires any further preoperative cardiology testing his EKG today shows sinus rhythm and insignificant Q waves, normal variant. He can have the procedure as an outpatient if any issues arise  please contact heart care.He withdraws antiplatelet therapy around the time of the procedure I would reinstitute low-dose aspirin 24 to 48 hours afterwards and can restart clopidogrel when safe from a bleeding perspective usually 2 to 4 days."    Reproductive/Obstetrics                           Anesthesia Physical Anesthesia Plan  ASA: III  Anesthesia Plan: General   Post-op Pain Management:    Induction: Intravenous  PONV Risk Score and Plan: 2 and Dexamethasone and Ondansetron  Airway Management Planned: Oral ETT  Additional Equipment: None  Intra-op Plan:   Post-operative Plan: Extubation in OR  Informed Consent: I have reviewed the patients History and Physical, chart, labs and discussed the procedure including the risks, benefits and alternatives for the proposed anesthesia with the patient or authorized representative who has indicated his/her understanding and acceptance.     Dental advisory given and Consent reviewed with POA  Plan Discussed with: CRNA and Surgeon  Anesthesia Plan Comments: (See PAT note 11/22/18, Konrad Felix, PA-C)       Anesthesia Quick Evaluation

## 2018-11-24 ENCOUNTER — Ambulatory Visit (HOSPITAL_COMMUNITY): Payer: Medicare Other | Admitting: Physician Assistant

## 2018-11-24 ENCOUNTER — Ambulatory Visit (HOSPITAL_COMMUNITY)
Admission: RE | Admit: 2018-11-24 | Discharge: 2018-11-24 | Disposition: A | Discharge status: Home / Self Care | Payer: Medicare Other | Source: Ambulatory Visit | Attending: Diagnostic Radiology | Admitting: Diagnostic Radiology

## 2018-11-24 ENCOUNTER — Other Ambulatory Visit: Payer: Self-pay

## 2018-11-24 ENCOUNTER — Ambulatory Visit (HOSPITAL_COMMUNITY): Payer: Medicare Other | Admitting: Certified Registered"

## 2018-11-24 ENCOUNTER — Observation Stay (HOSPITAL_COMMUNITY)
Admission: RE | Admit: 2018-11-24 | Discharge: 2018-11-25 | Disposition: A | Discharge status: Home / Self Care | Payer: Medicare Other | Attending: Diagnostic Radiology | Admitting: Diagnostic Radiology

## 2018-11-24 ENCOUNTER — Encounter (HOSPITAL_COMMUNITY): Payer: Self-pay | Admitting: Certified Registered"

## 2018-11-24 ENCOUNTER — Encounter (HOSPITAL_COMMUNITY)
Admission: RE | Disposition: A | Discharge status: Home / Self Care | Payer: Self-pay | Source: Home / Self Care | Attending: Diagnostic Radiology

## 2018-11-24 DIAGNOSIS — B1920 Unspecified viral hepatitis C without hepatic coma: Secondary | ICD-10-CM | POA: Insufficient documentation

## 2018-11-24 DIAGNOSIS — F329 Major depressive disorder, single episode, unspecified: Secondary | ICD-10-CM | POA: Diagnosis not present

## 2018-11-24 DIAGNOSIS — Z6829 Body mass index (BMI) 29.0-29.9, adult: Secondary | ICD-10-CM | POA: Insufficient documentation

## 2018-11-24 DIAGNOSIS — J449 Chronic obstructive pulmonary disease, unspecified: Secondary | ICD-10-CM | POA: Insufficient documentation

## 2018-11-24 DIAGNOSIS — F17200 Nicotine dependence, unspecified, uncomplicated: Secondary | ICD-10-CM | POA: Diagnosis not present

## 2018-11-24 DIAGNOSIS — M1990 Unspecified osteoarthritis, unspecified site: Secondary | ICD-10-CM | POA: Diagnosis not present

## 2018-11-24 DIAGNOSIS — M5136 Other intervertebral disc degeneration, lumbar region: Secondary | ICD-10-CM | POA: Diagnosis not present

## 2018-11-24 DIAGNOSIS — C641 Malignant neoplasm of right kidney, except renal pelvis: Principal | ICD-10-CM | POA: Insufficient documentation

## 2018-11-24 DIAGNOSIS — Q909 Down syndrome, unspecified: Secondary | ICD-10-CM | POA: Insufficient documentation

## 2018-11-24 DIAGNOSIS — Z79899 Other long term (current) drug therapy: Secondary | ICD-10-CM | POA: Insufficient documentation

## 2018-11-24 DIAGNOSIS — E785 Hyperlipidemia, unspecified: Secondary | ICD-10-CM | POA: Diagnosis not present

## 2018-11-24 DIAGNOSIS — Z8673 Personal history of transient ischemic attack (TIA), and cerebral infarction without residual deficits: Secondary | ICD-10-CM | POA: Diagnosis not present

## 2018-11-24 DIAGNOSIS — N2889 Other specified disorders of kidney and ureter: Secondary | ICD-10-CM

## 2018-11-24 DIAGNOSIS — Z7951 Long term (current) use of inhaled steroids: Secondary | ICD-10-CM | POA: Diagnosis not present

## 2018-11-24 DIAGNOSIS — Z9049 Acquired absence of other specified parts of digestive tract: Secondary | ICD-10-CM | POA: Insufficient documentation

## 2018-11-24 DIAGNOSIS — Z7902 Long term (current) use of antithrombotics/antiplatelets: Secondary | ICD-10-CM | POA: Diagnosis not present

## 2018-11-24 DIAGNOSIS — G40909 Epilepsy, unspecified, not intractable, without status epilepticus: Secondary | ICD-10-CM | POA: Insufficient documentation

## 2018-11-24 DIAGNOSIS — K219 Gastro-esophageal reflux disease without esophagitis: Secondary | ICD-10-CM | POA: Insufficient documentation

## 2018-11-24 DIAGNOSIS — I253 Aneurysm of heart: Secondary | ICD-10-CM | POA: Insufficient documentation

## 2018-11-24 DIAGNOSIS — N289 Disorder of kidney and ureter, unspecified: Secondary | ICD-10-CM

## 2018-11-24 DIAGNOSIS — Z7982 Long term (current) use of aspirin: Secondary | ICD-10-CM | POA: Diagnosis not present

## 2018-11-24 DIAGNOSIS — E559 Vitamin D deficiency, unspecified: Secondary | ICD-10-CM | POA: Diagnosis not present

## 2018-11-24 DIAGNOSIS — D696 Thrombocytopenia, unspecified: Secondary | ICD-10-CM | POA: Diagnosis not present

## 2018-11-24 DIAGNOSIS — E039 Hypothyroidism, unspecified: Secondary | ICD-10-CM | POA: Diagnosis not present

## 2018-11-24 HISTORY — PX: RADIOLOGY WITH ANESTHESIA: SHX6223

## 2018-11-24 LAB — TYPE AND SCREEN
ABO/RH(D): B POS
Antibody Screen: NEGATIVE

## 2018-11-24 SURGERY — CT WITH ANESTHESIA
Anesthesia: General | Laterality: Right

## 2018-11-24 MED ORDER — ALBUTEROL SULFATE (2.5 MG/3ML) 0.083% IN NEBU: 2.5000 mg | INHALATION_SOLUTION | RESPIRATORY_TRACT | Status: DC | PRN

## 2018-11-24 MED ORDER — ONDANSETRON HCL 4 MG/2ML IJ SOLN
INTRAMUSCULAR | Status: DC | PRN
  Administered 2018-11-24 (×2): 4 mg via INTRAVENOUS

## 2018-11-24 MED ORDER — FENTANYL CITRATE (PF) 250 MCG/5ML IJ SOLN
INTRAMUSCULAR | Status: DC | PRN
  Administered 2018-11-24: 25 ug via INTRAVENOUS
  Administered 2018-11-24: 75 ug via INTRAVENOUS

## 2018-11-24 MED ORDER — DOCUSATE SODIUM 100 MG PO CAPS
100.0000 mg | ORAL_CAPSULE | Freq: Two times a day (BID) | ORAL | Status: DC
  Administered 2018-11-24 – 2018-11-25 (×2): 100 mg via ORAL
  Filled 2018-11-24 (×3): qty 1

## 2018-11-24 MED ORDER — CLONAZEPAM 0.5 MG PO TABS: 0.5000 mg | ORAL_TABLET | Freq: Three times a day (TID) | ORAL | Status: DC | PRN

## 2018-11-24 MED ORDER — ROCURONIUM BROMIDE 10 MG/ML (PF) SYRINGE
PREFILLED_SYRINGE | INTRAVENOUS | Status: DC | PRN
  Administered 2018-11-24 (×3): 10 mg via INTRAVENOUS
  Administered 2018-11-24: 20 mg via INTRAVENOUS
  Administered 2018-11-24: 60 mg via INTRAVENOUS
  Administered 2018-11-24: 10 mg via INTRAVENOUS

## 2018-11-24 MED ORDER — LIDOCAINE 2% (20 MG/ML) 5 ML SYRINGE
INTRAMUSCULAR | Status: DC | PRN
  Administered 2018-11-24: 60 mg via INTRAVENOUS

## 2018-11-24 MED ORDER — ACETAMINOPHEN 10 MG/ML IV SOLN: 1000.0000 mg | Freq: Once | INTRAVENOUS | Status: DC | PRN

## 2018-11-24 MED ORDER — ACETAMINOPHEN 160 MG/5ML PO SOLN: 1000.0000 mg | Freq: Once | ORAL | Status: DC | PRN

## 2018-11-24 MED ORDER — ACETAMINOPHEN 500 MG PO TABS: 1000.0000 mg | ORAL_TABLET | Freq: Once | ORAL | Status: DC | PRN

## 2018-11-24 MED ORDER — LACOSAMIDE 50 MG PO TABS
200.0000 mg | ORAL_TABLET | Freq: Two times a day (BID) | ORAL | Status: DC
  Administered 2018-11-24 – 2018-11-25 (×2): 200 mg via ORAL
  Filled 2018-11-24 (×2): qty 4

## 2018-11-24 MED ORDER — MIDAZOLAM HCL 2 MG/2ML IJ SOLN
INTRAMUSCULAR | Status: AC
  Filled 2018-11-24: qty 4

## 2018-11-24 MED ORDER — CITALOPRAM HYDROBROMIDE 20 MG PO TABS
40.0000 mg | ORAL_TABLET | Freq: Every day | ORAL | Status: DC
  Administered 2018-11-25: 40 mg via ORAL
  Filled 2018-11-24: qty 2

## 2018-11-24 MED ORDER — PROPOFOL 10 MG/ML IV BOLUS
INTRAVENOUS | Status: DC | PRN
  Administered 2018-11-24: 90 mg via INTRAVENOUS
  Administered 2018-11-24: 30 mg via INTRAVENOUS

## 2018-11-24 MED ORDER — SODIUM CHLORIDE (PF) 0.9 % IJ SOLN
INTRAMUSCULAR | Status: AC
  Filled 2018-11-24: qty 50

## 2018-11-24 MED ORDER — HYDROCODONE-ACETAMINOPHEN 5-325 MG PO TABS: 1.0000 | ORAL_TABLET | ORAL | Status: DC | PRN

## 2018-11-24 MED ORDER — FENTANYL CITRATE (PF) 100 MCG/2ML IJ SOLN: 25.0000 ug | INTRAMUSCULAR | Status: DC | PRN

## 2018-11-24 MED ORDER — IOHEXOL 300 MG/ML  SOLN
30.0000 mL | Freq: Once | INTRAMUSCULAR | Status: AC | PRN
  Administered 2018-11-24: 30 mL

## 2018-11-24 MED ORDER — TRAZODONE HCL 50 MG PO TABS
50.0000 mg | ORAL_TABLET | Freq: Every day | ORAL | Status: DC
  Administered 2018-11-24: 50 mg via ORAL
  Filled 2018-11-24: qty 1

## 2018-11-24 MED ORDER — CEFAZOLIN SODIUM-DEXTROSE 2-4 GM/100ML-% IV SOLN
2.0000 g | INTRAVENOUS | Status: AC
  Administered 2018-11-24: 2 g via INTRAVENOUS
  Filled 2018-11-24: qty 100

## 2018-11-24 MED ORDER — ATORVASTATIN CALCIUM 40 MG PO TABS
40.0000 mg | ORAL_TABLET | Freq: Every day | ORAL | Status: DC
  Administered 2018-11-25: 40 mg via ORAL
  Filled 2018-11-24: qty 1

## 2018-11-24 MED ORDER — IOHEXOL 350 MG/ML SOLN
100.0000 mL | Freq: Once | INTRAVENOUS | Status: AC | PRN
  Administered 2018-11-24: 60 mL via INTRAVENOUS

## 2018-11-24 MED ORDER — FENTANYL CITRATE (PF) 250 MCG/5ML IJ SOLN
INTRAMUSCULAR | Status: AC
  Filled 2018-11-24: qty 5

## 2018-11-24 MED ORDER — OXYCODONE HCL 5 MG PO TABS: 5.0000 mg | ORAL_TABLET | Freq: Once | ORAL | Status: DC | PRN

## 2018-11-24 MED ORDER — SODIUM CHLORIDE 0.9 % IV SOLN
INTRAVENOUS | Status: AC
  Filled 2018-11-24: qty 250

## 2018-11-24 MED ORDER — GUAIFENESIN-DM 100-10 MG/5ML PO SYRP: 5.0000 mL | ORAL_SOLUTION | Freq: Two times a day (BID) | ORAL | Status: DC | PRN

## 2018-11-24 MED ORDER — ALBUTEROL SULFATE HFA 108 (90 BASE) MCG/ACT IN AERS: 1.0000 | INHALATION_SPRAY | RESPIRATORY_TRACT | Status: DC | PRN

## 2018-11-24 MED ORDER — MIDAZOLAM HCL 2 MG/2ML IJ SOLN
INTRAMUSCULAR | Status: DC | PRN
  Administered 2018-11-24: 0.5 mg via INTRAVENOUS

## 2018-11-24 MED ORDER — SODIUM CHLORIDE 0.9 % IV SOLN
INTRAVENOUS | Status: DC | PRN
  Administered 2018-11-24: 25 ug/min via INTRAVENOUS

## 2018-11-24 MED ORDER — OXYCODONE HCL 5 MG/5ML PO SOLN: 5.0000 mg | Freq: Once | ORAL | Status: DC | PRN

## 2018-11-24 MED ORDER — SUGAMMADEX SODIUM 500 MG/5ML IV SOLN
INTRAVENOUS | Status: DC | PRN
  Administered 2018-11-24: 300 mg via INTRAVENOUS

## 2018-11-24 MED ORDER — BUPROPION HCL ER (XL) 150 MG PO TB24
150.0000 mg | ORAL_TABLET | Freq: Every day | ORAL | Status: DC
  Administered 2018-11-25: 150 mg via ORAL
  Filled 2018-11-24: qty 1

## 2018-11-24 MED ORDER — DONEPEZIL HCL 10 MG PO TABS
5.0000 mg | ORAL_TABLET | Freq: Every day | ORAL | Status: DC
  Administered 2018-11-24: 5 mg via ORAL
  Filled 2018-11-24: qty 1

## 2018-11-24 MED ORDER — ONDANSETRON HCL 4 MG/2ML IJ SOLN: 4.0000 mg | Freq: Four times a day (QID) | INTRAMUSCULAR | Status: DC | PRN

## 2018-11-24 MED ORDER — ALENDRONATE SODIUM 70 MG PO TABS: 70.0000 mg | ORAL_TABLET | ORAL | Status: DC

## 2018-11-24 MED ORDER — LACTATED RINGERS IV SOLN
INTRAVENOUS | Status: DC
  Administered 2018-11-24 (×2): via INTRAVENOUS

## 2018-11-24 MED ORDER — RISPERIDONE 1 MG PO TABS
4.0000 mg | ORAL_TABLET | Freq: Every day | ORAL | Status: DC
  Administered 2018-11-24: 4 mg via ORAL
  Filled 2018-11-24: qty 4

## 2018-11-24 MED ORDER — BENZTROPINE MESYLATE 0.5 MG PO TABS
1.0000 mg | ORAL_TABLET | Freq: Every day | ORAL | Status: DC
  Administered 2018-11-24: 1 mg via ORAL
  Filled 2018-11-24: qty 2

## 2018-11-24 MED ORDER — DEXAMETHASONE SODIUM PHOSPHATE 10 MG/ML IJ SOLN
INTRAMUSCULAR | Status: DC | PRN
  Administered 2018-11-24: 10 mg via INTRAVENOUS

## 2018-11-24 NOTE — Transfer of Care (Signed)
Immediate Anesthesia Transfer of Care Note  Patient: Mario Liu  Procedure(s) Performed: CT WITH ANESTHESIA RENAL CRYOABLATION WITH BIOPSY (Right )  Patient LocatioPACU   Anesthesia Type:General  Level of Consciousness: sedated  Airway & Oxygen Therapy: Patient Spontanous Breathing and Patient connected to face mask oxygen  Post-op Assessment: Report given to RN and Post -op Vital signs reviewed and stable  Post vital signs: Reviewed and stable  Last Vitals:  Vitals Value Taken Time  BP 114/89 11/24/2018  1:23 PM  Temp    Pulse 88 11/24/2018  1:26 PM  Resp 15 11/24/2018  1:26 PM  SpO2 95 % 11/24/2018  1:26 PM  Vitals shown include unvalidated device data.  Last Pain:  Vitals:   11/24/18 0720  PainSc: 0-No pain      Patients Stated Pain Goal: 3 (81/01/75 1025)  Complications: hypotension intraop requiring Neo Gtt/ Moser/Green aware-- stable on arrival to PACU with Neo off

## 2018-11-24 NOTE — H&P (Signed)
Referring Physician(s): Anderson  Supervising Physician: Markus Daft  Patient Status:  [facility] OP TBA  Chief Complaint: Right renal mass   Subjective: Patient familiar to IR service from recent consultation with Dr. Anselm Pancoast on 10/13/2018 to discuss treatment options for suspicious right renal lesion. Recent imaging has demonstrated a 3.3 x 2.9 cm enhancing lesion along the medial  upper pole of right kidney.  There is also a complex cystic lesion in the right renal hilum measuring up to 6.5 cm.  Past medical history is significant for Down syndrome, hypothyroidism, depressive disorder, COPD, thrombocytopenia, stroke and hepatitis C.  He also has a PFO with atrial septal aneurysm.  He was deemed an appropriate candidate for CT-guided cryoablation and biopsy of the right renal lesion and presents today for the procedure.  He currently denies fever, headache, respiratory issues, abdominal, back pain, nausea, vomiting or bleeding. Additional history as below.  Past Medical History:  Diagnosis Date  . Acute ischemic left MCA stroke ([place])   . Altered mental status 03/09/2018  . Baker's cyst of knee, left 2018  . Bilateral renal cysts   . COPD (chronic obstructive pulmonary disease) ([place])   . DDD (degenerative disc disease), lumbar   . Down's syndrome   . GERD (gastroesophageal reflux disease)   . Hepatitis C   . Hepatitis C antibody test positive 05/16/2017  . Hx of cholecystectomy   . Hypothyroidism   . Kidney cysts   . Lumbar spondylosis   . Major depressive disorder    Was treated at facility in [place] [facility] years ago  . PFO with atrial septal aneurysm   . PONV (postoperative nausea and vomiting)   . Recurrent falls 03/01/2018  . Right kidney mass   . Seizure disorder ([place]) 11/24/2017  . Thrombocytopenia ([place])   . Tobacco abuse   . Vitamin D deficiency    Past Surgical History:  Procedure Laterality Date  . IR RADIOLOGIST EVAL & MGMT  10/13/2018  . LAPAROSCOPIC CHOLECYSTECTOMY         Allergies: Patient has no known allergies.  Medications: Prior to Admission medications   Medication Sig Start Date End Date Taking? Authorizing Provider  albuterol (PROVENTIL HFA;VENTOLIN HFA) 108 (90 Base) MCG/ACT inhaler Inhale 1-2 puffs into the lungs every 4 (four) hours as needed for wheezing or shortness of breath.    Yes [provider]  alendronate (FOSAMAX) 70 MG tablet Take 70 mg by mouth once a week. Fridays   Yes [provider]  aspirin 325 MG tablet Take 325 mg by mouth daily.   Yes [provider]  atorvastatin (LIPITOR) 40 MG tablet Take 40 mg by mouth at bedtime.    Yes [provider]  buPROPion (WELLBUTRIN XL) 150 MG 24 hr tablet Take 150 mg by mouth daily.    Yes [provider]  calcium carbonate (OS-CAL) 600 MG TABS tablet Take 600 mg by mouth 2 (two) times daily with a meal.    Yes [provider]  cholecalciferol (VITAMIN D) 1000 units tablet Take 1,000 Units by mouth daily.   Yes [provider]  citalopram (CELEXA) 40 MG tablet Take 40 mg by mouth daily.    Yes [provider]  clopidogrel (PLAVIX) 75 MG tablet Take 75 mg daily by mouth.   Yes [provider]  donepezil (ARICEPT) 5 MG tablet Take 5 mg by mouth at bedtime.   Yes [provider]  fluticasone (FLONASE) 50 MCG/ACT nasal spray Place  2 sprays into both nostrils daily.    Yes [provider]  Glycopyrrolate-Formoterol (BEVESPI AEROSPHERE) 9-4.8 MCG/ACT AERO Inhale 2 puffs into the lungs 2 (two) times daily.   Yes [provider]  lactose free nutrition (BOOST) LIQD Take 237 mLs by mouth 2 (two) times daily between meals.   Yes [provider]  loratadine (CLARITIN) 10 MG tablet Take 10 mg by mouth daily.   Yes [provider]  risperidone (RISPERDAL) 4 MG tablet Take 4 mg by mouth at bedtime.    Yes [provider]  traZODone (DESYREL) 50 MG tablet Take 1 tablet (50  mg total) by mouth at bedtime. 03/11/18  Yes Wieting, Richard, MD  acetaminophen (TYLENOL) 325 MG tablet Take 650 mg by mouth every 6 (six) hours as needed for mild pain or fever.    [provider]  alum & mag hydroxide-simeth (GERI-LANTA) 200-200-20 MG/5ML suspension Take 15 mLs by mouth every 6 (six) hours as needed for indigestion or heartburn.    [provider]  aspirin EC 81 MG tablet Take 1 tablet (81 mg total) by mouth daily. 10/27/18   Revankar, Reita Cliche, MD  benztropine (COGENTIN) 1 MG tablet Take 1 mg by mouth at bedtime.    [provider]  clonazePAM (KLONOPIN) 0.5 MG tablet Take 0.5 mg by mouth 3 (three) times daily as needed (for agitation).    [provider]  Dextromethorphan-guaiFENesin (ROBAFEN DM) 10-100 MG/5ML liquid Take 5 mLs by mouth every 12 (twelve) hours as needed (for cough).    [provider]  dimenhyDRINATE (DRAMAMINE) 50 MG tablet Take 50 mg by mouth every 8 (eight) hours as needed for nausea.    [provider]  food thickener (THICK IT) POWD Take 1 g by mouth as needed (for meals).    [provider]  guaiFENesin (ROBITUSSIN) 100 MG/5ML liquid Take 200 mg by mouth 3 (three) times daily as needed for cough.    [provider]  loperamide (IMODIUM) 2 MG capsule Take 2-4 mg by mouth as needed for diarrhea or loose stools.    [provider]  magnesium hydroxide (MILK OF MAGNESIA) 400 MG/5ML suspension Take 15 mLs by mouth daily as needed for mild constipation.    [provider]  phenol (CHLORASEPTIC) 1.4 % LIQD Use as directed 1 spray in the mouth or throat as needed for throat irritation / pain.    [provider]  polyethylene glycol (MIRALAX / GLYCOLAX) packet Take 17 g by mouth daily as needed for mild constipation or moderate constipation.     [provider]     Vital Signs: Blood pressure 114/84, heart rate 71, temp 97.5, respirations 18, O2 sat 100% room  air Ht 5\' 6"  (1.676 m)   Wt 180 lb 6 oz (81.8 kg)   BMI 29.11 kg/m   Physical Exam patient awake, does answer questions but not fully aware of details of procedure to be done today.  Chest clear to auscultation bilaterally.  Heart with regular rate and rhythm.  Abdomen soft, slightly protuberant, positive bowel sounds, nontender.  Trace pretibial edema bilaterally.  Imaging: Dg Chest 1 View  Result Date: 11/22/2018 CLINICAL DATA:  Preoperative study. EXAM: CHEST  1 VIEW COMPARISON:  Chest x-ray dated July 05, 2018. FINDINGS: The heart size and mediastinal contours are within normal limits. Normal pulmonary vascularity. No focal consolidation, pleural effusion, or pneumothorax. No acute osseous abnormality. IMPRESSION: No active disease. Electronically Signed   By: Gwyndolyn Saxon  Marzella Schlein M.D.   On: 11/22/2018 16:24    Labs:  CBC: Recent Labs    05/04/18 1010 07/05/18 0756 08/03/18 1357 11/22/18 1030  WBC 4.2 7.6 4.8 3.6*  HGB 13.2 12.9* 12.9* 13.7  HCT 40.9 41.2 41.6 44.3  PLT 108* 100* 106* 88*    COAGS: Recent Labs    11/22/18 1030  INR 1.0    BMP: Recent Labs    07/05/18 0756 07/06/18 0621 08/03/18 1357 09/28/18 1023 11/22/18 1030  NA 139 143 142  --  141  K 4.0 4.4 4.1  --  3.9  CL 103 112* 108  --  107  CO2 25 24 26   --  28  GLUCOSE 128* 116* 114*  --  120*  BUN 19 14 20   --  19  CALCIUM 9.4 8.6* 9.3  --  9.3  CREATININE 1.54* 0.97 0.98 1.30* 0.95  GFRNONAA 49* >60 >60  --  >60  GFRAA 57* >60 >60  --  >60    LIVER FUNCTION TESTS: Recent Labs    03/01/18 1847 03/08/18 1259 07/05/18 0756 08/03/18 1357  BILITOT 0.8 0.7 1.1 0.6  AST 25 28 30  35  ALT 21 26 26  39  ALKPHOS 47 53 46 60  PROT 7.2 8.8* 7.5 7.3  ALBUMIN 3.8 4.6 3.9 3.8    Assessment and Plan:  Pt with hx of 3.3 x 2.9 cm enhancing lesion along the medial upper pole of right kidney suspicious for renal cell carcinoma.  There is also a complex cystic lesion in the right renal hilum measuring  up to 6.5 cm.  Past medical history  significant for Down syndrome, hypothyroidism, depressive disorder, COPD, thrombocytopenia, stroke and hepatitis C.  He also has a PFO with atrial septal aneurysm.  He was deemed an appropriate candidate for CT-guided cryoablation and biopsy of the right renal lesion following recent consultation with Dr.Henn  and presents today for the procedure.  He has been cleared by cardiology undergo procedure.  Details/risk of procedure, including but not limited to, internal bleeding, infection, injury to adjacent structures, anesthesia related complications discussed with patient's legal guardian, [name], with her understanding and consent.  Post procedure he will be admitted for overnight observation.  Per staff patient has been off Plavix for at least 5 days.   Electronically Signed: D. Rowe Robert, PA-C 11/24/2018, 8:12 AM   I spent a total of 30 minutes at the the patient's bedside AND on the patient's hospital floor or unit, greater than 50% of which was counseling/coordinating care for ET guided cryoablation and possible biopsy of right renal lesion

## 2018-11-24 NOTE — Anesthesia Procedure Notes (Signed)
Date/Time: 11/24/2018 1:12 PM Performed by: Cynda Familia, CRNA Oxygen Delivery Method: Simple face mask Placement Confirmation: breath sounds checked- equal and bilateral Dental Injury: Teeth and Oropharynx as per pre-operative assessment

## 2018-11-24 NOTE — Procedures (Signed)
Interventional Radiology Procedure:   Indications: Suspicious right renal lesion  Procedure: CT guided cryoablation and CT guided core biopsy of right renal lesion  Findings: Mass in right kidney upper pole.  2 core biopsies obtained.  Lesion treated with 3 IceForce Galil cryoablation needles.  Dilated and obstructed lower pole collecting system, upper pole collection system is decompressed.    Complications: None     EBL: 10 ml  Plan: PACU for recovery and then overnight observation.     Estelene Carmack R. Anselm Pancoast, MD  Pager: [phone]

## 2018-11-24 NOTE — Sedation Documentation (Signed)
Anesthesia in to sedate and monitor. 

## 2018-11-24 NOTE — Progress Notes (Addendum)
Patient ID: Mario Liu, male   DOB: [birth year], 58 y.o.   MRN: [id number] Pt currently without c/o ;denies abd /flank pain VSS; afebrile Puncture site rt flank clean and dry,NT; yellow urine in foley cath   A/P: s/p cryoablation/bx of right renal mass earlier today; for overnight obs; check am labs; f/u in IR clinic with Dr. Anselm Pancoast in 3-4 weeks; d/w urology noted chronically obstructed rt lower pole calices   [facility]

## 2018-11-24 NOTE — Anesthesia Procedure Notes (Signed)
Procedure Name: Intubation Date/Time: 11/24/2018 8:59 AM Performed by: Cynda Familia, CRNA Pre-anesthesia Checklist: Patient identified, Emergency Drugs available, Suction available and Patient being monitored Patient Re-evaluated:Patient Re-evaluated prior to induction Oxygen Delivery Method: Circle System Utilized Preoxygenation: Pre-oxygenation with 100% oxygen Induction Type: IV induction Ventilation: Mask ventilation without difficulty Laryngoscope Size: Miller and 2 Grade View: Grade I Tube type: Oral Tube size: 7.5 mm Number of attempts: 1 Airway Equipment and Method: Stylet Placement Confirmation: ETT inserted through vocal cords under direct vision,  positive ETCO2 and breath sounds checked- equal and bilateral Secured at: 22 cm Tube secured with: Tape Dental Injury: Teeth and Oropharynx as per pre-operative assessment  Comments: Smooth IV induction Moser-intubation AM CRNA atraumatic-- teeth and mouth as preop-- head and neck maintained in neutral position-- bilat BS [employer]

## 2018-11-25 ENCOUNTER — Ambulatory Visit: Payer: Medicare Other | Admitting: Adult Health

## 2018-11-25 DIAGNOSIS — C641 Malignant neoplasm of right kidney, except renal pelvis: Secondary | ICD-10-CM | POA: Diagnosis not present

## 2018-11-25 LAB — BASIC METABOLIC PANEL
Anion gap: 8 (ref 5–15)
BUN: 19 mg/dL (ref 6–20)
CHLORIDE: 103 mmol/L (ref 98–111)
CO2: 29 mmol/L (ref 22–32)
Calcium: 9 mg/dL (ref 8.9–10.3)
Creatinine, Ser: 1.14 mg/dL (ref 0.61–1.24)
GFR calc Af Amer: >60 mL/min (ref >60)
GFR calc non Af Amer: >60 mL/min (ref >60)
Glucose, Bld: 143 mg/dL — ABNORMAL HIGH (ref 70–99)
POTASSIUM: 4.2 mmol/L (ref 3.5–5.1)
Sodium: 140 mmol/L (ref 135–145)

## 2018-11-25 NOTE — Discharge Instructions (Signed)
Cryoablation, Care After  This sheet gives you information about how to care for yourself after your procedure. Your health care provider may also give you more specific instructions. If you have problems or questions, contact your health care provider.  What can I expect after the procedure?  After the procedure, it is common to have:   Soreness around the treatment area.   Mild pain and swelling in the treatment area.  Follow these instructions at home:  Treatment area care     Follow instructions from your health care provider about how to take care of your incision. Make sure you:  ? Wash your hands with soap and water before you change your bandage (dressing). If soap and water are not available, use hand sanitizer.  ? Change your dressing as told by your health care provider.  ? Leave stitches (sutures) in place. They may need to stay in place for 2 weeks or longer.   Check your treatment area every day for signs of infection. Check for:  ? More redness, swelling, or pain.  ? More fluid or blood.  ? Warmth.  ? Pus or a bad smell.   Keep the treated area clean, dry, and covered with a dressing until it has healed. Clean the area with soap and water or as told by your health care provider.   You may shower if your health care provider approves. If your bandage gets wet, change it right away.  Activity   Follow instructions from your health care provider about any activity limitations.   Do not drive for 24 hours if you received a medicine to help you relax (sedative).  General instructions   Take over-the-counter and prescription medicines only as told by your health care provider.   Keep all follow-up visits as told by your health care provider. This is important.  Contact a health care provider if:   You do not have a bowel movement for 2 days.   You have nausea or vomiting.   You have more redness, swelling, or pain around your treatment area.   You have more fluid or blood coming from your  treatment area.   Your treatment area feels warm to the touch.   You have pus or a bad smell coming from your treatment area.   You have a fever.  Get help right away if:   You have severe pain.   You have trouble swallowing or breathing.   You have severe weakness or dizziness.   You have chest pain or shortness of breath.  This information is not intended to replace advice given to you by your health care provider. Make sure you discuss any questions you have with your health care provider.  Document Released: 06/22/2013 Document Revised: 03/21/2016 Document Reviewed: 01/30/2016  [employer]vier Interactive Patient Education  2019 [employer]

## 2018-11-25 NOTE — Discharge Summary (Signed)
Patient ID: Mario Liu MRN: [id number] DOB/AGE: 56/08/1961 56 y.o.  Admit date: 11/24/2018 Discharge date: 11/25/2018  Supervising Physician: Jacqulynn Cadet  Patient Status: [facility] - In-pt  Admission Diagnoses: Renal lesion  Discharge Diagnoses:  Active Problems:   Renal lesion   Discharged Condition: stable  Hospital Course:  Patient presented to [facility] 11/24/2018 for an image-guided right renal lesion biopsy and cryoablation with Dr. Anselm Pancoast. Procedure occurred without major complications and patient was transferred to floor in stable condition (VSS) for overnight observation. No major events occurred overnight.  Patient awake and alert laying in bed with no complaints at this time. Denies flank pain. Right flank puncture incision sites c/d/i. Plan to discharge home today and follow-up with Dr. Anselm Pancoast in clinic in 3-4 weeks.   Consults: None  Significant Diagnostic Studies: Dg Chest 1 View  Result Date: 11/22/2018 CLINICAL DATA:  Preoperative study. EXAM: CHEST  1 VIEW COMPARISON:  Chest x-ray dated July 05, 2018. FINDINGS: The heart size and mediastinal contours are within normal limits. Normal pulmonary vascularity. No focal consolidation, pleural effusion, or pneumothorax. No acute osseous abnormality. IMPRESSION: No active disease. Electronically Signed   By: Titus Dubin M.D.   On: 11/22/2018 16:24   Ct Guide Tissue Ablation  Result Date: 11/24/2018 INDICATION: 58 year old with a suspicious right renal lesion. Patient is scheduled for CT-guided biopsy and cryoablation of the lesion. EXAM: CT-GUIDED CRYOABLATION OF RIGHT RENAL LESION CT-GUIDED BIOPSY OF RIGHT RENAL LESION COMPARISON:  MRI 09/28/2018 MEDICATIONS: Ancef 2 g; The antibiotic was administered in an appropriate time interval prior to needle puncture of the skin. ANESTHESIA/SEDATION: General - as administered by the Anesthesia department FLUOROSCOPY TIME:  No fluoroscopy time. Procedure was performed in CT.  COMPLICATIONS: None immediate. TECHNIQUE: Informed written consent was obtained from the patient's sister after a thorough discussion of the procedural risks, benefits and alternatives. All questions were addressed. A timeout was performed prior to the initiation of the procedure. Patient was placed prone on the CT scanner. Images through the abdomen were obtained. The right renal lesion was difficult to see on the non contrast images. Therefore, 60 mL Omnipaque 350 was given intravenously and follow up CT images were obtained. Ultrasound was prepped for the procedure but it was difficult to identify the kidney lesion with ultrasound due to ribs. Using CT guidance, a total of 3 IceFORCE Galil cryoablation needles were placed in the right kidney upper pole lesion. Needles were placed in a superior, mid and inferior aspect of the lesion. Multiple CT images were used to confirm needle placement within the lesion. Needle tips were advanced to the edge or just beyond the edge of the posterior aspect of the lesion. A 17 gauge coaxial needle was directed along the medial aspect of lesion with CT guidance. Needle was placed along the edge of the lesion and 2 core biopsies were obtained with an 18 gauge core device. Specimens placed in formalin. [place] needle was directed along the superior aspect of the left renal lesion and dilute contrast was injected for hydro dissection. Approximately 250 mL of this dilute contrast was injected. Follow up CT images were obtained to confirm hydro dissection along the posterior and medial aspect of the lesion. The lesion was treated with 10 minutes freeze, 8 minutes thaw and a second 10 minutes freeze. CT images were obtained at the end of the first 10 minutes freeze to confirm position of the ice balls. Cautery function was used for the 3 ablation needles prior to  removal. All 3 needles removed without complication. The hydro dissection needle was removed without complication.  Final CT images were obtained. Bandages placed over the puncture sites. FINDINGS: Again noted is a lesion in the posterior right kidney upper pole. Maximum dimension of lesion measured up to 3.9 cm on this study. Three cryoablation needles were positioned within the lesion. Two adequate core biopsies were obtained from the lesion. Significant amount of hydro dissection was performed along the posterior and lateral aspect of the lesion. Ice ball coverage appeared to be adequate to treat the entire lesion. High-density material along the most superior needle track on the post ablation images is indeterminate. Contrast fills dilated calyces in the right kidney lower pole. 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 No evidence for renal pelvic or upper pole obstruction. IMPRESSION: CT-guided cryoablation of the right kidney upper pole lesion. CT-guided core biopsy of right kidney upper pole lesion. 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 Electronically Signed   By: Markus Daft M.D.   On: 11/24/2018 14:09   Ct Biopsy  Result Date: 11/24/2018 INDICATION: 58 year old with a suspicious right renal lesion. Patient is scheduled for CT-guided biopsy and cryoablation of the lesion. EXAM: CT-GUIDED CRYOABLATION OF RIGHT RENAL LESION CT-GUIDED BIOPSY OF RIGHT RENAL LESION COMPARISON:  MRI 09/28/2018 MEDICATIONS: Ancef 2 g; The antibiotic was administered in an appropriate time interval prior to needle puncture of the skin. ANESTHESIA/SEDATION: General - as administered by the Anesthesia department FLUOROSCOPY TIME:  No fluoroscopy time. Procedure was performed in CT. COMPLICATIONS: None immediate. TECHNIQUE: Informed written consent was obtained from the patient's sister after a thorough discussion of the procedural risks, benefits and alternatives. All questions were addressed. A timeout was performed prior to the initiation of the procedure. Patient was placed prone on the CT  scanner. Images through the abdomen were obtained. The right renal lesion was difficult to see on the non contrast images. Therefore, 60 mL Omnipaque 350 was given intravenously and follow up CT images were obtained. Ultrasound was prepped for the procedure but it was difficult to identify the kidney lesion with ultrasound due to ribs. Using CT guidance, a total of 3 IceFORCE Galil cryoablation needles were placed in the right kidney upper pole lesion. Needles were placed in a superior, mid and inferior aspect of the lesion. Multiple CT images were used to confirm needle placement within the lesion. Needle tips were advanced to the edge or just beyond the edge of the posterior aspect of the lesion. A 17 gauge coaxial needle was directed along the medial aspect of lesion with CT guidance. Needle was placed along the edge of the lesion and 2 core biopsies were obtained with an 18 gauge core device. Specimens placed in formalin. [place] needle was directed along the superior aspect of the left renal lesion and dilute contrast was injected for hydro dissection. Approximately 250 mL of this dilute contrast was injected. Follow up CT images were obtained to confirm hydro dissection along the posterior and medial aspect of the lesion. The lesion was treated with 10 minutes freeze, 8 minutes thaw and a second 10 minutes freeze. CT images were obtained at the end of the first 10 minutes freeze to confirm position of the ice balls. Cautery function was used for the 3 ablation needles prior to removal. All 3 needles removed without complication. The hydro dissection needle was removed without complication. Final CT images were obtained. Bandages placed over the puncture sites. FINDINGS: Again noted  is a lesion in the posterior right kidney upper pole. Maximum dimension of lesion measured up to 3.9 cm on this study. Three cryoablation needles were positioned within the lesion. Two adequate core biopsies were obtained  from the lesion. Significant amount of hydro dissection was performed along the posterior and lateral aspect of the lesion. Ice ball coverage appeared to be adequate to treat the entire lesion. High-density material along the most superior needle track on the post ablation images is indeterminate. Contrast fills dilated calyces in the right kidney lower pole. 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 No evidence for renal pelvic or upper pole obstruction. IMPRESSION: CT-guided cryoablation of the right kidney upper pole lesion. CT-guided core biopsy of right kidney upper pole lesion. 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 Electronically Signed   By: Markus Daft M.D.   On: 11/24/2018 14:09    Treatments: CT-guided cryoablation of right renal lesion  Discharge Exam: Blood pressure 92/63, pulse 82, temperature 97.8 F (36.6 C), temperature source Oral, resp. rate 16, height 5\' 6"  (1.676 m), weight 180 lb 6 oz (81.8 kg), SpO2 95 %. Physical Exam Vitals signs and nursing note reviewed.  Constitutional:      General: He is not in acute distress.    Appearance: Normal appearance.  Cardiovascular:     Rate and Rhythm: Normal rate and regular rhythm.     Heart sounds: Normal heart sounds.  Pulmonary:     Effort: Pulmonary effort is normal. No respiratory distress.     Breath sounds: Normal breath sounds. No wheezing.  Abdominal:     Comments: Right flank puncture sites without tenderness, erythema, bleeding, or drainage.  Skin:    General: Skin is warm and dry.  Neurological:     Mental Status: He is alert and oriented to person, place, and time.  Psychiatric:        Mood and Affect: Mood normal.        Behavior: Behavior normal.        Thought Content: Thought content normal.        Judgment: Judgment normal.     Disposition: Discharge disposition: 01-Home or Self Care       Discharge Instructions    Call MD for:  difficulty  breathing, headache or visual disturbances   Complete by:  As directed    Call MD for:  extreme fatigue   Complete by:  As directed    Call MD for:  hives   Complete by:  As directed    Call MD for:  persistant dizziness or light-headedness   Complete by:  As directed    Call MD for:  persistant nausea and vomiting   Complete by:  As directed    Call MD for:  redness, tenderness, or signs of infection (pain, swelling, redness, odor or green/yellow discharge around incision site)   Complete by:  As directed    Call MD for:  severe uncontrolled pain   Complete by:  As directed    Call MD for:  temperature >100.4   Complete by:  As directed    Diet - low sodium heart healthy   Complete by:  As directed    Discharge instructions   Complete by:  As directed    Can shower today. No submerging (bathing, swimming) until Saturday 11/27/2018   Increase activity slowly   Complete by:  As directed    No dressing needed   Complete by:  As directed  Allergies as of 11/25/2018   No Known Allergies     Medication List    TAKE these medications   acetaminophen 325 MG tablet Commonly known as:  TYLENOL Take 650 mg by mouth every 6 (six) hours as needed for mild pain or fever.   albuterol 108 (90 Base) MCG/ACT inhaler Commonly known as:  PROVENTIL HFA;VENTOLIN HFA Inhale 1-2 puffs into the lungs every 4 (four) hours as needed for wheezing or shortness of breath.   alendronate 70 MG tablet Commonly known as:  FOSAMAX Take 70 mg by mouth once a week. Fridays   aspirin 325 MG tablet Take 325 mg by mouth daily.   aspirin EC 81 MG tablet Take 1 tablet (81 mg total) by mouth daily.   atorvastatin 40 MG tablet Commonly known as:  LIPITOR Take 40 mg by mouth at bedtime.   benztropine 1 MG tablet Commonly known as:  COGENTIN Take 1 mg by mouth at bedtime.   Bevespi Aerosphere 9-4.8 MCG/ACT Aero Generic drug:  Glycopyrrolate-Formoterol Inhale 2 puffs into the lungs 2 (two) times  daily.   buPROPion 150 MG 24 hr tablet Commonly known as:  WELLBUTRIN XL Take 150 mg by mouth daily.   calcium carbonate 600 MG Tabs tablet Commonly known as:  OS-CAL Take 600 mg by mouth 2 (two) times daily with a meal.   cholecalciferol 1000 units tablet Commonly known as:  VITAMIN D Take 1,000 Units by mouth daily.   citalopram 40 MG tablet Commonly known as:  CELEXA Take 40 mg by mouth daily.   clonazePAM 0.5 MG tablet Commonly known as:  KLONOPIN Take 0.5 mg by mouth 3 (three) times daily as needed (for agitation).   clopidogrel 75 MG tablet Commonly known as:  PLAVIX Take 75 mg daily by mouth.   dimenhyDRINATE 50 MG tablet Commonly known as:  DRAMAMINE Take 50 mg by mouth every 8 (eight) hours as needed for nausea.   donepezil 5 MG tablet Commonly known as:  ARICEPT Take 5 mg by mouth at bedtime.   fluticasone 50 MCG/ACT nasal spray Commonly known as:  FLONASE Place 2 sprays into both nostrils daily.   food thickener Powd Commonly known as:  THICK IT Take 1 g by mouth as needed (for meals).   [place] Spar 200-200-20 MG/5ML suspension Generic drug:  alum & mag hydroxide-simeth Take 15 mLs by mouth every 6 (six) hours as needed for indigestion or heartburn.   guaiFENesin 100 MG/5ML liquid Commonly known as:  ROBITUSSIN Take 200 mg by mouth 3 (three) times daily as needed for cough.   lactose free nutrition Liqd Take 237 mLs by mouth 2 (two) times daily between meals.   loperamide 2 MG capsule Commonly known as:  IMODIUM Take 2-4 mg by mouth as needed for diarrhea or loose stools.   loratadine 10 MG tablet Commonly known as:  CLARITIN Take 10 mg by mouth daily.   magnesium hydroxide 400 MG/5ML suspension Commonly known as:  MILK OF MAGNESIA Take 15 mLs by mouth daily as needed for mild constipation.   phenol 1.4 % Liqd Commonly known as:  CHLORASEPTIC Use as directed 1 spray in the mouth or throat as needed for throat irritation / pain.    polyethylene glycol packet Commonly known as:  MIRALAX / GLYCOLAX Take 17 g by mouth daily as needed for mild constipation or moderate constipation.   risperidone 4 MG tablet Commonly known as:  RISPERDAL Take 4 mg by mouth at bedtime.   Robafen DM  10-100 MG/5ML liquid Generic drug:  Dextromethorphan-guaiFENesin Take 5 mLs by mouth every 12 (twelve) hours as needed (for cough).   traZODone 50 MG tablet Commonly known as:  DESYREL Take 1 tablet (50 mg total) by mouth at bedtime.         Electronically Signed: Earley Abide, PA-C 11/25/2018, 10:29 AM   I have spent Less Than 30 Minutes discharging [employer].

## 2018-11-25 NOTE — Anesthesia Postprocedure Evaluation (Signed)
Anesthesia Post Note  Patient: Mario Liu  Procedure(s) Performed: CT WITH ANESTHESIA RENAL CRYOABLATION WITH BIOPSY (Right )     Anesthesia Post Evaluation  Last Vitals:  Vitals:   11/24/18 1902 11/25/18 0523  BP: 96/64 92/63  Pulse: 80 82  Resp: 16 16  Temp: 36.5 C 36.6 C  SpO2: 95% 95%    Last Pain:  Vitals:   11/25/18 1000  TempSrc:   PainSc: 0-No pain                 Apoorva Bugay

## 2018-11-26 ENCOUNTER — Encounter (HOSPITAL_COMMUNITY): Payer: Self-pay | Admitting: Diagnostic Radiology

## 2018-11-26 ENCOUNTER — Telehealth: Payer: Self-pay | Admitting: Urology

## 2018-11-26 NOTE — Telephone Encounter (Signed)
[facility] called and faxed over pt's kidney pathology report to [facility].  They wanted to make sure you knew it was on Epic.

## 2018-11-30 ENCOUNTER — Other Ambulatory Visit: Payer: Self-pay | Admitting: Internal Medicine

## 2018-11-30 ENCOUNTER — Other Ambulatory Visit: Payer: Self-pay | Admitting: Adult Health

## 2018-11-30 ENCOUNTER — Other Ambulatory Visit: Payer: Self-pay

## 2018-11-30 ENCOUNTER — Ambulatory Visit
Admission: RE | Admit: 2018-11-30 | Discharge: 2018-11-30 | Disposition: A | Discharge status: Home / Self Care | Payer: Medicare Other | Source: Ambulatory Visit | Attending: Internal Medicine | Admitting: Internal Medicine

## 2018-11-30 DIAGNOSIS — S0990XA Unspecified injury of head, initial encounter: Secondary | ICD-10-CM

## 2018-12-29 ENCOUNTER — Other Ambulatory Visit: Payer: Medicare Other

## 2019-02-01 ENCOUNTER — Ambulatory Visit: Payer: Medicare Other | Admitting: Oncology

## 2019-02-01 ENCOUNTER — Other Ambulatory Visit: Payer: Medicare Other

## 2019-02-12 ENCOUNTER — Other Ambulatory Visit: Payer: Self-pay

## 2019-02-12 ENCOUNTER — Encounter: Payer: Self-pay | Admitting: Emergency Medicine

## 2019-02-12 ENCOUNTER — Emergency Department
Admission: EM | Admit: 2019-02-12 | Discharge: 2019-02-13 | Disposition: A | Discharge status: Home / Self Care | Payer: Medicare Other | Attending: Student in an Organized Health Care Education/Training Program | Admitting: Student in an Organized Health Care Education/Training Program

## 2019-02-12 DIAGNOSIS — Z79899 Other long term (current) drug therapy: Secondary | ICD-10-CM | POA: Insufficient documentation

## 2019-02-12 DIAGNOSIS — Q909 Down syndrome, unspecified: Secondary | ICD-10-CM | POA: Diagnosis not present

## 2019-02-12 DIAGNOSIS — Z8673 Personal history of transient ischemic attack (TIA), and cerebral infarction without residual deficits: Secondary | ICD-10-CM | POA: Diagnosis not present

## 2019-02-12 DIAGNOSIS — F17210 Nicotine dependence, cigarettes, uncomplicated: Secondary | ICD-10-CM | POA: Diagnosis not present

## 2019-02-12 DIAGNOSIS — F329 Major depressive disorder, single episode, unspecified: Secondary | ICD-10-CM | POA: Insufficient documentation

## 2019-02-12 DIAGNOSIS — T675XXA Heat exhaustion, unspecified, initial encounter: Secondary | ICD-10-CM | POA: Diagnosis not present

## 2019-02-12 DIAGNOSIS — Z7902 Long term (current) use of antithrombotics/antiplatelets: Secondary | ICD-10-CM | POA: Insufficient documentation

## 2019-02-12 DIAGNOSIS — J449 Chronic obstructive pulmonary disease, unspecified: Secondary | ICD-10-CM | POA: Diagnosis not present

## 2019-02-12 DIAGNOSIS — Z9049 Acquired absence of other specified parts of digestive tract: Secondary | ICD-10-CM | POA: Insufficient documentation

## 2019-02-12 DIAGNOSIS — R4182 Altered mental status, unspecified: Secondary | ICD-10-CM | POA: Diagnosis present

## 2019-02-12 DIAGNOSIS — Z7982 Long term (current) use of aspirin: Secondary | ICD-10-CM | POA: Diagnosis not present

## 2019-02-12 DIAGNOSIS — E039 Hypothyroidism, unspecified: Secondary | ICD-10-CM | POA: Diagnosis not present

## 2019-02-12 LAB — CBC WITH DIFFERENTIAL/PLATELET
Abs Immature Granulocytes: 0.04 K/uL (ref 0.00–0.07)
Basophils Absolute: 0 K/uL (ref 0.0–0.1)
Basophils Relative: 0 %
Eosinophils Absolute: 0 K/uL (ref 0.0–0.5)
Eosinophils Relative: 0 %
HCT: 39.4 % (ref 39.0–52.0)
Hemoglobin: 12.4 g/dL — ABNORMAL LOW (ref 13.0–17.0)
Immature Granulocytes: 1 %
Lymphocytes Relative: 20 %
Lymphs Abs: 1.6 K/uL (ref 0.7–4.0)
MCH: 28.3 pg (ref 26.0–34.0)
MCHC: 31.5 g/dL (ref 30.0–36.0)
MCV: 90 fL (ref 80.0–100.0)
Monocytes Absolute: 0.6 K/uL (ref 0.1–1.0)
Monocytes Relative: 8 %
Neutro Abs: 5.5 K/uL (ref 1.7–7.7)
Neutrophils Relative %: 71 %
Platelets: 79 K/uL — ABNORMAL LOW (ref 150–400)
RBC: 4.38 MIL/uL (ref 4.22–5.81)
RDW: 12.6 % (ref 11.5–15.5)
WBC: 7.8 K/uL (ref 4.0–10.5)
nRBC: 0 % (ref 0.0–0.2)

## 2019-02-12 LAB — BASIC METABOLIC PANEL
Anion gap: 8 (ref 5–15)
BUN: 24 mg/dL — ABNORMAL HIGH (ref 6–20)
CO2: 23 mmol/L (ref 22–32)
Calcium: 8.8 mg/dL — ABNORMAL LOW (ref 8.9–10.3)
Chloride: 103 mmol/L (ref 98–111)
Creatinine, Ser: 1.65 mg/dL — ABNORMAL HIGH (ref 0.61–1.24)
GFR calc Af Amer: 53 mL/min — ABNORMAL LOW (ref >60)
GFR calc non Af Amer: 46 mL/min — ABNORMAL LOW (ref >60)
Glucose, Bld: 121 mg/dL — ABNORMAL HIGH (ref 70–99)
Potassium: 4 mmol/L (ref 3.5–5.1)
Sodium: 134 mmol/L — ABNORMAL LOW (ref 135–145)

## 2019-02-12 MED ORDER — SODIUM CHLORIDE 0.9 % IV BOLUS
1000.0000 mL | Freq: Once | INTRAVENOUS | Status: AC
  Administered 2019-02-12: 22:00:00 1000 mL via INTRAVENOUS

## 2019-02-12 MED ORDER — SODIUM CHLORIDE 0.9 % IV BOLUS: 250.0000 mL | Freq: Once | INTRAVENOUS | Status: DC

## 2019-02-12 NOTE — ED Provider Notes (Addendum)
[facility] Emergency Department Provider Note    First MD Initiated Contact with Patient 02/12/19 2259     (approximate)  I have reviewed the triage vital signs and the nursing notes.   HISTORY  Chief Complaint Dehydration   HPI Mario Liu is a 58 y.o. male presents to the ER for evaluation of AMS.  Was out on the front porch at his group home this afternoon and got overheated.  He reportedly went inside was feeling "tired."  He was sent to the ER for evaluation.  He denies any symptoms or complaints at this time after having received food and water and rested in the [facility].   No HA.  No blurry vision.   Past Medical History:  Diagnosis Date  . Acute ischemic left MCA stroke ([place])   . Altered mental status 03/09/2018  . Baker's cyst of knee, left 2018  . Bilateral renal cysts   . COPD (chronic obstructive pulmonary disease) ([place])   . DDD (degenerative disc disease), lumbar   . Down's syndrome   . GERD (gastroesophageal reflux disease)   . Hepatitis C   . Hepatitis C antibody test positive 05/16/2017  . Hx of cholecystectomy   . Hypothyroidism   . Kidney cysts   . Lumbar spondylosis   . Major depressive disorder    Was treated at facility in [place] ([facility] years ago  . PFO with atrial septal aneurysm   . PONV (postoperative nausea and vomiting)   . Recurrent falls 03/01/2018  . Right kidney mass   . Seizure disorder ([place]) 11/24/2017  . Thrombocytopenia ([place])   . Tobacco abuse   . Vitamin D deficiency    Family History  Adopted: Yes  Problem Relation Age of Onset  . Diabetes Maternal Grandmother   . Heart attack Maternal Aunt   . Prostate cancer Neg Hx   . Bladder Cancer Neg Hx   . Kidney cancer Neg Hx    Past Surgical History:  Procedure Laterality Date  . IR RADIOLOGIST EVAL & MGMT  10/13/2018  . LAPAROSCOPIC CHOLECYSTECTOMY    . RADIOLOGY WITH ANESTHESIA Right 11/24/2018   Procedure: CT WITH ANESTHESIA RENAL CRYOABLATION WITH BIOPSY;   Surgeon: Markus Daft, MD;  Location: [facility] ORS;  Service: Anesthesiology;  Laterality: Right;   Patient Active Problem List   Diagnosis Date Noted  . Renal lesion 11/24/2018  . Preoperative cardiovascular examination 10/27/2018  . Hyperlipidemia 10/27/2018  . AMS (altered mental status) 07/07/2018  . Altered mental status 03/09/2018  . Recurrent falls 03/01/2018  . Seizure disorder ([place]) 11/24/2017  . Major depressive disorder   . Hepatitis C antibody test positive 05/16/2017  . Acute ischemic left MCA stroke ([place])   . Down's syndrome   . PFO with atrial septal aneurysm       Prior to Admission medications   Medication Sig Start Date End Date Taking? Authorizing Provider  acetaminophen (TYLENOL) 325 MG tablet Take 650 mg by mouth every 6 (six) hours as needed for mild pain or fever.    [provider]  albuterol (PROVENTIL HFA;VENTOLIN HFA) 108 (90 Base) MCG/ACT inhaler Inhale 1-2 puffs into the lungs every 4 (four) hours as needed for wheezing or shortness of breath.     [provider]  alendronate (FOSAMAX) 70 MG tablet Take 70 mg by mouth once a week. Fridays    [provider]  alum & mag hydroxide-simeth (GERI-LANTA) 200-200-20 MG/5ML suspension Take 15 mLs by mouth every  6 (six) hours as needed for indigestion or heartburn.    [provider]  aspirin 325 MG tablet Take 325 mg by mouth daily.    [provider]  aspirin EC 81 MG tablet Take 1 tablet (81 mg total) by mouth daily. 10/27/18   Revankar, Reita Cliche, MD  atorvastatin (LIPITOR) 40 MG tablet Take 40 mg by mouth at bedtime.     [provider]  benztropine (COGENTIN) 1 MG tablet Take 1 mg by mouth at bedtime.    [provider]  buPROPion (WELLBUTRIN XL) 150 MG 24 hr tablet Take 150 mg by mouth daily.     [provider]  calcium carbonate (OS-CAL) 600 MG TABS tablet Take 600 mg by mouth 2 (two) times daily with a meal.     [provider]   cholecalciferol (VITAMIN D) 1000 units tablet Take 1,000 Units by mouth daily.    [provider]  citalopram (CELEXA) 40 MG tablet Take 40 mg by mouth daily.     [provider]  clonazePAM (KLONOPIN) 0.5 MG tablet Take 0.5 mg by mouth 3 (three) times daily as needed (for agitation).    [provider]  clopidogrel (PLAVIX) 75 MG tablet Take 75 mg daily by mouth.    [provider]  Dextromethorphan-guaiFENesin (ROBAFEN DM) 10-100 MG/5ML liquid Take 5 mLs by mouth every 12 (twelve) hours as needed (for cough).    [provider]  dimenhyDRINATE (DRAMAMINE) 50 MG tablet Take 50 mg by mouth every 8 (eight) hours as needed for nausea.    [provider]  donepezil (ARICEPT) 5 MG tablet Take 5 mg by mouth at bedtime.    [provider]  fluticasone (FLONASE) 50 MCG/ACT nasal spray Place 2 sprays into both nostrils daily.     [provider]  food thickener (THICK IT) POWD Take 1 g by mouth as needed (for meals).    [provider]  Glycopyrrolate-Formoterol (BEVESPI AEROSPHERE) 9-4.8 MCG/ACT AERO Inhale 2 puffs into the lungs 2 (two) times daily.    [provider]  guaiFENesin (ROBITUSSIN) 100 MG/5ML liquid Take 200 mg by mouth 3 (three) times daily as needed for cough.    [provider]  lactose free nutrition (BOOST) LIQD Take 237 mLs by mouth 2 (two) times daily between meals.    [provider]  loperamide (IMODIUM) 2 MG capsule Take 2-4 mg by mouth as needed for diarrhea or loose stools.    [provider]  loratadine (CLARITIN) 10 MG tablet Take 10 mg by mouth daily.    [provider]  magnesium hydroxide (MILK OF MAGNESIA) 400 MG/5ML suspension Take 15 mLs by mouth daily as needed for mild constipation.    [provider]  phenol (CHLORASEPTIC) 1.4 % LIQD Use as directed 1 spray in the mouth or throat as needed for throat irritation / pain.    [provider]  polyethylene glycol (MIRALAX / GLYCOLAX) packet Take 17 g by mouth daily as needed for mild constipation or moderate constipation.     [provider]  risperidone (RISPERDAL) 4 MG tablet Take 4 mg by mouth at bedtime.     [provider]  traZODone (DESYREL) 50 MG tablet Take 1 tablet (50 mg total) by mouth at bedtime. 03/11/18   Loletha Grayer, MD    Allergies Patient has no known allergies.    Social History Social History   Tobacco Use  . Smoking status: Current  Some Day Smoker    Packs/day: 0.50    Years: 39.00    Pack years: 19.50    Types: Cigarettes  . Smokeless tobacco: Never Used  . Tobacco comment: pt is still smoking  Substance Use Topics  . Alcohol use: Not Currently  . Drug use: No    Review of Systems Patient denies headaches, rhinorrhea, blurry vision, numbness, shortness of breath, chest pain, edema, cough, abdominal pain, nausea, vomiting, diarrhea, dysuria, fevers, rashes or hallucinations unless otherwise stated above in HPI. ____________________________________________   PHYSICAL EXAM:  VITAL SIGNS: Vitals:   02/12/19 1949  BP: 99/68  Pulse: 80  Resp: 18  Temp: 98.1 F (36.7 C)  SpO2: 97%    Constitutional: Alert and oriented.  Eyes: Conjunctivae are normal.  Head: Atraumatic. Nose: No congestion/rhinnorhea. Mouth/Throat: Mucous membranes are moist.   Neck: No stridor. Painless ROM.  Cardiovascular: Normal rate, regular rhythm. Grossly normal heart sounds.  Good peripheral circulation. Respiratory: Normal respiratory effort.  No retractions. Lungs CTAB. Gastrointestinal: Soft and nontender. No distention. No abdominal bruits. No CVA tenderness. Genitourinary:  Musculoskeletal: No lower extremity tenderness nor edema.  No joint effusions. Neurologic:  Normal speech and language. No gross focal neurologic deficits are appreciated. No facial droop Skin:  Skin is warm, dry and intact. No rash noted.  Psychiatric: Mood and affect are normal. Speech and behavior are normal.  ____________________________________________   LABS (all labs ordered are listed, but only abnormal results are displayed)  Results for orders placed or performed during the hospital encounter of 02/12/19 (from the past 24 hour(s))  Basic metabolic panel     Status: Abnormal   Collection Time: 02/12/19 10:54 PM  Result Value Ref Range   Sodium 134 (L) 135 - 145 mmol/L   Potassium 4.0 3.5 - 5.1 mmol/L   Chloride 103 98 - 111 mmol/L   CO2 23 22 - 32 mmol/L   Glucose, Bld 121 (H) 70 - 99 mg/dL   BUN 24 (H) 6 - 20 mg/dL   Creatinine, Ser 1.65 (H) 0.61 - 1.24 mg/dL   Calcium 8.8 (L) 8.9 - 10.3 mg/dL   GFR calc non Af Amer 46 (L) >60 mL/min   GFR calc Af Amer 53 (L) >60 mL/min   Anion gap 8 5 - 15  CBC with Differential     Status: Abnormal   Collection Time: 02/12/19 10:54 PM  Result Value Ref Range   WBC 7.8 4.0 - 10.5 K/uL   RBC 4.38 4.22 - 5.81 MIL/uL   Hemoglobin 12.4 (L) 13.0 - 17.0 g/dL   HCT 39.4 39.0 - 52.0 %   MCV 90.0 80.0 - 100.0 fL   MCH 28.3 26.0 - 34.0 pg   MCHC 31.5 30.0 - 36.0 g/dL   RDW 12.6 11.5 - 15.5 %   Platelets 79 (L) 150 - 400 K/uL   nRBC 0.0 0.0 - 0.2 %   Neutrophils Relative % 71 %   Neutro Abs 5.5 1.7 - 7.7 K/uL   Lymphocytes Relative 20 %   Lymphs Abs 1.6 0.7 - 4.0 K/uL   Monocytes Relative 8 %   Monocytes Absolute 0.6 0.1 - 1.0 K/uL   Eosinophils Relative 0 %   Eosinophils Absolute 0.0 0.0 - 0.5 K/uL   Basophils Relative 0 %   Basophils Absolute 0.0 0.0 - 0.1 K/uL   Immature Granulocytes 1 %   Abs Immature Granulocytes 0.04 0.00 - 0.07 K/uL   ____________________________________________  EK____________________________________________  RADIOLOGY   ____________________________________________  PROCEDURES  Procedure(s) performed:  Procedures    Critical Care performed: no ____________________________________________   INITIAL IMPRESSION / ASSESSMENT AND  PLAN / ED COURSE  Pertinent labs & imaging results that were available during my care of the patient were reviewed by me and considered in my medical decision making (see chart for details).   DDX: heat syncope, heat exhaustion, heat stroke, electrolyte abn, cva  Mario Liu is a 58 y.o. who presents to the ED with symptoms as desceribed above. Patient is AFVSS in ED. Exam as above. Given current presentation have considered the above differential.  Non toxic and well appearing now having been observed in the ER.  The patient will be placed on continuous pulse oximetry and telemetry for monitoring.  Laboratory evaluation will be sent to evaluate for the above complaints.    Clinical Course as of May 31 0001  Sat Feb 12, 2019  2356 Patient mildly elevated cr as compared to previous.  No hyperkalemia.  Na just below normal range.  Has received bolus.  Patient was able to tolerate PO and was able to ambulate with a steady gait.  Stable and appropriate for outpatient follow up.   [PR]    Clinical Course User Index [PR] Merlyn Lot, MD    The patient was evaluated in Emergency Department today for the symptoms described in the history of present illness. He/she was evaluated in the context of the global COVID-19 pandemic, which necessitated consideration that the patient might be at risk for infection with the SARS-CoV-2 virus that causes COVID-19. Institutional protocols and algorithms that pertain to the evaluation of patients at risk for COVID-19 are in a state of rapid change based on information released by regulatory bodies including the CDC and federal and state organizations. These policies and algorithms were followed during the patient's care in the ED.  As part of my medical decision making, I reviewed the following data within the [place] notes reviewed and incorporated, Labs reviewed, notes from prior ED visits and [place] Controlled Substance Database    ____________________________________________   FINAL CLINICAL IMPRESSION(S) / ED DIAGNOSES  Final diagnoses:  Heat exhaustion, initial encounter      NEW MEDICATIONS STARTED DURING THIS VISIT:  New Prescriptions   No medications on file     Note:  This document was prepared using Dragon voice recognition software and may include unintentional dictation errors.    Merlyn Lot, MD 02/12/19 2312    Merlyn Lot, MD 02/13/19 0001

## 2019-02-12 NOTE — ED Notes (Signed)
Iv team at bedside  

## 2019-02-12 NOTE — ED Notes (Signed)
Pt finished his nap and is requesting food and water. Given both. Lab was able to obtain his bloodwork

## 2019-02-12 NOTE — ED Notes (Signed)
The iv is infiltrated. Fluids stopped. Dr was made aware. Lab called for the blood draw.

## 2019-02-12 NOTE — ED Triage Notes (Signed)
Per ems pt was left by the group home out in the heat on the front porch for 3 hours and when they brought him back inside he was not walking well (hx of stumbles and falls), and they felt he wasn't acting himself. Pt states he is tired, declines anything to eat or drink. No complaints

## 2019-02-12 NOTE — ED Notes (Signed)
Iv team was able to establish US guided iv into left bicep, but unable to obtain bloodwork. The blood they did collect coagulated before it could be put into tubes.

## 2019-02-13 NOTE — ED Notes (Signed)
Spoke with group home - they didn't want to come and get him until morning. Advised him that in a pandemic we cant wait until morning. Advised they could pay for transport or come and get him. Mario Liu (group Runner, broadcasting/film/video) states they will come and get him.

## 2019-02-13 NOTE — ED Notes (Signed)
Discussed patient with caregiver who stated he would come to pick up patient shortly.  Patient resting quietly at this time.

## 2019-02-13 NOTE — ED Notes (Signed)
Mother called and informed of discharge. Attempted call to the number given to ems for pick up [phone] with no answer.

## 2019-02-23 ENCOUNTER — Telehealth: Payer: Self-pay

## 2019-02-23 NOTE — Telephone Encounter (Addendum)
I called pts grouo home facility and spoke with Geraldine Solar. I explain appt will be change to virtual video due to the COVID 19. I explain that a smart phone, computer can be use with a camera.I have email of [email or web address] for link to be sent to. I explain the doxy process and to click link 10 minutes early before appt. I explain pt and her need to be in front of camera. [place] verbalized understanding.

## 2019-02-23 NOTE — Telephone Encounter (Signed)
Video link email to [facility] for March 01, 2019 at 115pm.

## 2019-02-23 NOTE — Telephone Encounter (Signed)
Revised. 

## 2019-03-01 ENCOUNTER — Ambulatory Visit: Payer: Medicare Other | Admitting: Adult Health

## 2019-03-01 ENCOUNTER — Other Ambulatory Visit: Payer: Self-pay | Admitting: Diagnostic Radiology

## 2019-03-01 ENCOUNTER — Other Ambulatory Visit: Payer: Self-pay

## 2019-03-01 DIAGNOSIS — N289 Disorder of kidney and ureter, unspecified: Secondary | ICD-10-CM

## 2019-03-01 NOTE — Telephone Encounter (Signed)
I called group home to get pts medications updated. I was on hold for 5 to 8 minutes for someone to pick up.I spoke with Rise Paganini at the group home. I stated pt has a video visit at 0945am with [facility] group home coordinator.I requested copy of pts medications to be fax to our office. Rise Paganini stated she was in another building and could not leave the office with residents. She updated the med list with RN over the phone. Med list updated and pharmacy is [facility].

## 2019-03-01 NOTE — Progress Notes (Deleted)
[facility] [address] [place]. [place] [address] [phone]       FOLLOW-UP NOTE  [name] Date of Birth:  1961-03-19 Medical Record Number:  [id number]   Virtual Visit via Video Note  I connected with Mario Liu on 03/01/19 at  9:45 AM EDT by a video enabled telemedicine application located remotely in my own home and verified that I am speaking with the correct person using two identifiers who was located at their own home.   I discussed the limitations of evaluation and management by telemedicine and the availability of in person appointments. The patient expressed understanding and agreed to proceed.   HPI:  Initial visit 08/17/17 [name] is a 58 year old African-American male with Down syndrome who is seen today for first office follow-up visit following hospital admission for stroke in August 2018. He is accompanied by his caregiver from group home in [place] where he lives. History is obtained from them as well as review of electronic medical records. I have personally reviewed imaging films.Mario Liu is a 58yo male with PMH significant for depression, Down's syndrome (currently living in a group home), COPD, and tobacco use who presents with aphasia since this morning at around 7am.  They called EMS and he was brought to [facility]. Head CT at [facility] demonstrated hypodensity in L MCA territory, consistent with infarct. He was transferred to [facility] for further evaluation and potential intervention. Patient was not administered IV t-PA secondary to unclear time of onset. CT angiogram and perfusion were obtained which showed a small mismatch but given patient's poor baseline functioning he was not considered a candidate for endovascular treatment. MRI scan of the brain showed a large left MCA infarct with small petechial hemorrhagic transformation. 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 Lower extremity venous Dopplers were negative for DVT. Transthoracic echo  showed normal ejection fraction. Bubble study was positive for small PFO/atrial septal defect. LDL cholesterol was elevated to 80 mg percent and hemoglobin A1c was 5.9. It was decided not to consider further workup with a TEE and loop recorder given his poor functional baseline he is not a candidate for PFO closure on anticoagulation. Patient was started on Lipitor and aspirin. He has done well. His speech is still impaired and slurred but the can be understood with some difficulty. He is able to return almost back to his previous baseline. He can swallow well but at times chokes if he eats too quickly. His blood pressure seems to be well controlled and today it is [id number]. Is tolerating Lipitor without muscle aches and pains. He has no complaints today.  11/24/2017 visit PS :Patient is seen today for follow-up after his last visit 3 months ago. He is accompanied by his caregiver from the group home. Patient is unable to provide history. I have reviewed notes which accompany him and also I  called and spoke to a nurse at the group home where he stays. Patient apparently was visiting family during Christmas time and had a bunch of seizures. Detailed description is not available. Patient was initially started on Keppra but for some reason was switched to Vimpat. He is currently on 200 mg twice daily and apparently tolerating it well and has had no witnessed seizures in the group home. He is otherwise in good health with no neurological changes. His tolerating aspirin without any bruising or bleeding. His blood pressure seems well controlled. He has not had any recurrent stroke or TIA symptoms. Patient has not had any  brain imaging or EEG study done for seizures.  05/27/2018 visit: Patient is being seen today for scheduled follow-up visit and is accompanied by group home caregiver.  Since previous appointment, he was seen at [facility] on 03/08/2018 with concerns of altered mental status.  He was seen previously  on 03/01/2018 with similar complaints and was diagnosed with a UTI at that time.  UTI negative on 03/08/2018 admission.  MRI head reviewed and was negative for acute abnormality.  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  Patient was discharged back to group home in stable condition.  Patient states overall he has been doing well.  He has not had additional altered mental status episodes since most recent hospitalization.  On the hospitalization of 03/08/2018, Vimpat was discontinued for unclear reasons -possibly due to potential side effects of increased fatigue and altered mental status.  Since thisdiscontinuation, patient has not had any seizure like activity.  He has been more active in his group home.  Continues to take Plavix and Lipitor without side effects for secondary stroke prevention.  He was seen by psychiatry who restarted him on his psychiatric medications.  Denies new or worsening stroke/TIA symptoms or seizure-like activity.  03/01/2019 VIRTUAL VISIT: Mario Liu is being seen today for follow-up regarding left MCA infarct in 04/2017.  He continues to reside at group home and has been stable from a neurological standpoint.  He continues on aspirin and atorvastatin without reported side effects.  He has been undergoing extensive urological work-up with potential findings of renal cell carcinoma and underwent renal biopsy on 11/24/2018.   Aspirin dosage     ROS:   14 system review of systems is positive for no complaints and all other systems negative PMH:  Past Medical History:  Diagnosis Date   Acute ischemic left MCA stroke (HCC)    Altered mental status 03/09/2018   Baker's cyst of knee, left 2018   Bilateral renal cysts    COPD (chronic obstructive pulmonary disease) (HCC)    DDD  (degenerative disc disease), lumbar    Down's syndrome    GERD (gastroesophageal reflux disease)    Hepatitis C    Hepatitis C antibody test positive 05/16/2017   Hx of cholecystectomy    Hypothyroidism    Kidney cysts    Lumbar spondylosis    Major depressive disorder    Was treated at facility in [place] [facility] years ago   PFO with atrial septal aneurysm    PONV (postoperative nausea and vomiting)    Recurrent falls 03/01/2018   Right kidney mass    Seizure disorder ([place]) 11/24/2017   Thrombocytopenia (HCC)    Tobacco abuse    Vitamin D deficiency     Social History:  Social History   Socioeconomic History   Marital status: Single    Spouse name: Not on file   Number of children: Not on file   Years of education: Not on file   Highest education level: Not on file  Occupational History   Not on file  Social Needs   Financial resource strain: Not on file   Food insecurity    Worry: Not on file    Inability: Not on file   Transportation needs    Medical: Not on file    Non-medical: Not on file  Tobacco Use   Smoking status: Current Some Day Smoker    Packs/day: 0.50    Years: 39.00    Pack years: 19.50    Types: Cigarettes   Smokeless tobacco: Never Used   Tobacco comment: pt is still smoking  Substance and Sexual Activity   Alcohol use: Not Currently   Drug use: No   Sexual activity: Not on file  Lifestyle   Physical activity    Days per week: Not on file    Minutes per session: Not on file   Stress: Not on file  Relationships   Social connections    Talks on phone: Not on file    Gets together: Not on file    Attends religious service: Not on file    Active member of club or organization: Not on file    Attends meetings of clubs or organizations: Not on file    Relationship status: Not on file   Intimate partner violence    Fear of current or ex partner: Not on file    Emotionally abused: Not on file    Physically  abused: Not on file    Forced sexual activity: Not on file  Other Topics Concern   Not on file  Social History Narrative   Not on file    Medications:   Current Outpatient Medications on File Prior to Visit  Medication Sig Dispense Refill   acetaminophen (TYLENOL) 325 MG tablet Take 650 mg by mouth every 6 (six) hours as needed for mild pain or fever.     albuterol (PROVENTIL HFA;VENTOLIN HFA) 108 (90 Base) MCG/ACT inhaler Inhale 1-2 puffs into the lungs every 4 (four) hours as needed for wheezing or shortness of breath.      alendronate (FOSAMAX) 70 MG tablet Take 70 mg by mouth once a week. Fridays     alum & mag hydroxide-simeth (GERI-LANTA) 200-200-20 MG/5ML suspension Take 15 mLs by mouth every 6 (six) hours as needed for indigestion or heartburn.     aspirin 325 MG tablet Take 325 mg by mouth daily.     aspirin EC 81 MG tablet Take 1 tablet (81 mg total) by mouth daily. 90 tablet 3   atorvastatin (LIPITOR) 40 MG tablet Take 40 mg by mouth at bedtime.      benztropine (COGENTIN) 1 MG tablet Take 1 mg by mouth at bedtime.     buPROPion (WELLBUTRIN XL) 150 MG 24 hr tablet Take 150 mg by mouth daily.      calcium carbonate (OS-CAL) 600 MG TABS tablet Take 600 mg by mouth 2 (two) times daily with a meal.      cholecalciferol (VITAMIN D) 1000 units tablet Take 1,000 Units by mouth daily.     citalopram (CELEXA) 40 MG tablet Take 40 mg by mouth daily.      clonazePAM (KLONOPIN) 0.5 MG tablet Take 0.5 mg by mouth 3 (three) times daily as needed (for agitation).     clopidogrel (PLAVIX) 75 MG tablet Take 75 mg daily by mouth.     Dextromethorphan-guaiFENesin (ROBAFEN DM) 10-100 MG/5ML liquid Take 5 mLs by mouth every 12 (twelve) hours as needed (for cough).     dimenhyDRINATE (DRAMAMINE) 50 MG tablet Take 50 mg by mouth every 8 (eight) hours as needed for nausea.     donepezil (ARICEPT) 5 MG tablet Take 5 mg by mouth at bedtime.     fluticasone (FLONASE) 50 MCG/ACT nasal  spray Place 2 sprays into both nostrils daily.  food thickener (THICK IT) POWD Take 1 g by mouth as needed (for meals).     Glycopyrrolate-Formoterol (BEVESPI AEROSPHERE) 9-4.8 MCG/ACT AERO Inhale 2 puffs into the lungs 2 (two) times daily.     guaiFENesin (ROBITUSSIN) 100 MG/5ML liquid Take 200 mg by mouth 3 (three) times daily as needed for cough.     lactose free nutrition (BOOST) LIQD Take 237 mLs by mouth 2 (two) times daily between meals.     loperamide (IMODIUM) 2 MG capsule Take 2-4 mg by mouth as needed for diarrhea or loose stools.     loratadine (CLARITIN) 10 MG tablet Take 10 mg by mouth daily.     magnesium hydroxide (MILK OF MAGNESIA) 400 MG/5ML suspension Take 15 mLs by mouth daily as needed for mild constipation.     phenol (CHLORASEPTIC) 1.4 % LIQD Use as directed 1 spray in the mouth or throat as needed for throat irritation / pain.     polyethylene glycol (MIRALAX / GLYCOLAX) packet Take 17 g by mouth daily as needed for mild constipation or moderate constipation.      risperidone (RISPERDAL) 4 MG tablet Take 4 mg by mouth at bedtime.      traZODone (DESYREL) 50 MG tablet Take 1 tablet (50 mg total) by mouth at bedtime.     Current Facility-Administered Medications on File Prior to Visit  Medication Dose Route Frequency Provider Last Rate Last Dose   lacosamide (VIMPAT) tablet 200 mg  200 mg Oral BID Garvin Fila, MD        Allergies:  No Known Allergies  Physical Exam General: Obese middle-aged pleasant African-American male, seated, in no evident distress Head: head normocephalic and atraumatic.  Neck: supple with no carotid or supraclavicular bruits Cardiovascular: regular rate and rhythm, no murmurs Musculoskeletal: no deformity Skin:  no rash/petichiae Vascular:  Normal pulses all extremities  There were no vitals filed for this visit. Neurologic Exam Mental Status: Awake and fully alert. Diminished attention, registration and recall. Speech  is slurred but can be understood with some difficulty. Can comprehend midline and one-step commands only. Attention span, concentration and fund of knowledge poor. Mood and affect appropriate and cooperative during the assessment.  Cranial Nerves: Fundoscopic exam not done. Pupils equal, briskly reactive to light. Extraocular movements full without nystagmus. Visual fields full to confrontation. Hearing intact. Facial sensation intact. Face, tongue, palate moves normally and symmetrically.  Motor: Normal bulk and tone. Normal strength in all tested extremity muscles. Sensory.: intact to touch ,pinprick .position and vibratory sensation.  Coordination: Rapid alternating movements normal in all extremities. Finger-to-nose and heel-to-shin performed accurately bilaterally. Gait and Station: Arises from chair without difficulty. Stance is normal. Gait demonstrates normal stride length and balance . Able to heel, toe and tandem walk with difficulty.  Reflexes: 1+ and symmetric. Toes downgoing.      IMAGING/LABS:  CT HEAD WO CONTRAST 03/01/2018 IMPRESSION: Changes of prior left MCA infarct with encephalomalacia change. No acute abnormality is noted.  MR BRAIN WO CONTRAST 03/02/2018 IMPRESSION: 1. No acute intracranial process on this moderately motion degraded examination. 2. Old LEFT MCA territory infarct. 3. Abnormally nasal cavity mucosal thickening, recommend direct Inspection  CT HEAD WO CONTRAST 03/08/2018 IMPRESSION: 1. No acute intracranial abnormality. 2. Stable chronic left posterior MCA distribution infarct.  MR BRAIN WO CONTRAST 03/08/2018 IMPRESSION: 1. No acute intracranial abnormality. 2. Stable chronic encephalomalacia involving the left parietal and temporal lobe.  EEG ADULT 01/11/2018 Summary Normal electroencephalogram, awake, asleep and with activation procedures.  There are no focal lateralizing or epileptiform features.     ASSESSMENT: 58 year old  African-American male with Down syndrome with cryptogenic left MCA infarct in August 2018 who seems to be doing reasonably well. Vascular risk factors of hypertension and hyperlipidemia and mild obesity. Patient is being seen today for scheduled office visit with recent hospitalizations due to AMS.  Vimpat was stopped possibly due to potential side effects and since this time, patient has not had additional AMS episodes or seizure activity.    PLAN: -Continue clopidogrel 75 mg daily  and Lipitor for secondary stroke prevention -F/u with PCP regarding your HLD and HTN management -No indication to restart antiepileptics at this time as patient has been stable without additional seizure activity since discontinuation of Vimpat.  If patient does have additional seizure activity, consider restarting antiepileptics if needed -Advised to continue to stay active and maintain a healthy diet -continue to monitor BP at home -Maintain strict control of hypertension with blood pressure goal below 130/90, diabetes with hemoglobin A1c goal below 6.5% and cholesterol with LDL cholesterol (bad cholesterol) goal below 70 mg/dL. I also advised the patient to eat a healthy diet with plenty of whole grains, cereals, fruits and vegetables, exercise regularly and maintain ideal body weight.  Follow up in 6 months or call earlier if needed or with additional seizure activity  Greater than 50% of time during this 25 minute visit was spent on counseling,explanation of diagnosis of stroke, seizures cognitive impairment, planning of further management, discussion with patient and family and coordination of care  Venancio Poisson, AGNP-BC  [facility] [address] [place], [place] [address]  Phone [phone] Fax [phone] Note: This document was prepared with digital dictation and possible smart phrase technology. Any transcriptional errors that result from this process are  unintentional.

## 2019-03-02 ENCOUNTER — Ambulatory Visit
Admission: RE | Admit: 2019-03-02 | Discharge: 2019-03-02 | Disposition: A | Discharge status: Home / Self Care | Payer: Medicare Other | Source: Ambulatory Visit | Attending: Diagnostic Radiology | Admitting: Diagnostic Radiology

## 2019-03-02 ENCOUNTER — Other Ambulatory Visit: Payer: Self-pay

## 2019-03-02 DIAGNOSIS — N289 Disorder of kidney and ureter, unspecified: Secondary | ICD-10-CM | POA: Insufficient documentation

## 2019-03-02 MED ORDER — GADOBUTROL 1 MMOL/ML IV SOLN
7.0000 mL | Freq: Once | INTRAVENOUS | Status: AC | PRN
  Administered 2019-03-02: 7 mL via INTRAVENOUS

## 2019-03-03 ENCOUNTER — Ambulatory Visit
Admission: RE | Admit: 2019-03-03 | Discharge: 2019-03-03 | Disposition: A | Discharge status: Home / Self Care | Payer: Medicare Other | Source: Ambulatory Visit | Attending: Student | Admitting: Student

## 2019-03-03 ENCOUNTER — Encounter: Payer: Self-pay | Admitting: *Deleted

## 2019-03-03 DIAGNOSIS — N289 Disorder of kidney and ureter, unspecified: Secondary | ICD-10-CM

## 2019-03-03 HISTORY — PX: IR RADIOLOGIST EVAL & MGMT: IMG5224

## 2019-03-03 NOTE — Progress Notes (Signed)
Chief Complaint: Patient was consulted remotely today (TeleHealth) for follow-up of cryoablation at the request of Louk,Alexandra M.    Referring Physician(s): Delfin Gant MD  History of Present Illness: Mario Liu is a 58 y.o. male with a past medical history significant for Down syndrome and resides at [facility] in [place].  Patient had a suspicious right renal lesion that was biopsied and treated with CT-guided cryoablation on 11/24/2018.  Biopsy demonstrated papillary renal cell carcinoma, solid type I, nuclear grade 2.  At the time of the ablation and biopsy, the lesion measured up to 3.9 cm.  Patient tolerated the ablation well and was observed overnight in the hospital.  Patient was discharged the day following the procedure.  Consultation was performed over the telephone due to the COVID-19 pandemic.  Due to patient's Down syndrome and mental capacity, the follow-up visit today was performed with 2 separate phone calls.  One phone conservation with the patient's sister who is his legal guardian.  The second phone conversation was with the patient's caretaker at the patient's residence, [facility].  According to the caretaker, the patient has been doing well.  He has a good appetite and no evidence for fevers or chills.  He denies abdominal pain.  No urinary or bowel issues.  Specifically, the patient does not have dysuria or hematuria to her knowledge.  Patient was treated in the emergency department recently on 02/12/2019 for altered mental status due to heat exhaustion.  Reportedly, that the patient has been asymptomatic since that emergency department visit.  Past Medical History:  Diagnosis Date   Acute ischemic left MCA stroke (HCC)    Altered mental status 03/09/2018   Baker's cyst of knee, left 2018   Bilateral renal cysts    COPD (chronic obstructive pulmonary disease) (HCC)    DDD (degenerative disc disease), lumbar     Down's syndrome    GERD (gastroesophageal reflux disease)    Hepatitis C    Hepatitis C antibody test positive 05/16/2017   Hx of cholecystectomy    Hypothyroidism    Kidney cysts    Lumbar spondylosis    Major depressive disorder    Was treated at facility in [employer] [facility] years ago   PFO with atrial septal aneurysm    PONV (postoperative nausea and vomiting)    Recurrent falls 03/01/2018   Right kidney mass    Seizure disorder ([facility]) 11/24/2017   Thrombocytopenia ([facility])    Tobacco abuse    Vitamin D deficiency     Past Surgical History:  Procedure Laterality Date   IR RADIOLOGIST EVAL & MGMT  10/13/2018   LAPAROSCOPIC CHOLECYSTECTOMY     RADIOLOGY WITH ANESTHESIA Right 11/24/2018   Procedure: CT WITH ANESTHESIA RENAL CRYOABLATION WITH BIOPSY;  Surgeon: Markus Daft, MD;  Location: [facility];  Service: Anesthesiology;  Laterality: Right;    Allergies: Patient has no known allergies.  Medications: Prior to Admission medications   Medication Sig Start Date End Date Taking? Authorizing Provider  acetaminophen (TYLENOL) 325 MG tablet Take 650 mg by mouth every 6 (six) hours as needed for mild pain or fever.    [provider]  albuterol (PROVENTIL HFA;VENTOLIN HFA) 108 (90 Base) MCG/ACT inhaler Inhale 1-2 puffs into the lungs every 4 (four) hours as needed for wheezing or shortness of breath.     [provider]  alendronate (FOSAMAX) 70 MG tablet Take 70 mg by mouth once a week. Fridays  [provider]  alum & mag hydroxide-simeth (GERI-LANTA) 200-200-20 MG/5ML suspension Take 15 mLs by mouth every 6 (six) hours as needed for indigestion or heartburn.    [provider]  aspirin EC 81 MG tablet Take 1 tablet (81 mg total) by mouth daily. 10/27/18   Revankar, Reita Cliche, MD  atorvastatin (LIPITOR) 40 MG tablet Take 40 mg by mouth at bedtime.     [provider]  benztropine (COGENTIN) 1 MG tablet Take 1 mg by mouth at  bedtime.    [provider]  buPROPion (WELLBUTRIN XL) 150 MG 24 hr tablet Take 150 mg by mouth daily.     [provider]  calcium carbonate (OS-CAL) 600 MG TABS tablet Take 600 mg by mouth daily with breakfast.     [provider]  cholecalciferol (VITAMIN D) 1000 units tablet Take 1,000 Units by mouth daily.    [provider]  citalopram (CELEXA) 40 MG tablet Take 40 mg by mouth daily.     [provider]  clonazePAM (KLONOPIN) 0.5 MG tablet Take 0.5 mg by mouth 3 (three) times daily as needed (for agitation).    [provider]  clopidogrel (PLAVIX) 75 MG tablet Take 75 mg daily by mouth.    [provider]  Dextromethorphan-guaiFENesin (ROBAFEN DM) 10-100 MG/5ML liquid Take 5 mLs by mouth every 12 (twelve) hours as needed (for cough).    [provider]  dimenhyDRINATE (DRAMAMINE) 50 MG tablet Take 50 mg by mouth every 8 (eight) hours as needed for nausea.    [provider]  donepezil (ARICEPT) 5 MG tablet Take 5 mg by mouth at bedtime.    [provider]  fluticasone (FLONASE) 50 MCG/ACT nasal spray Place 2 sprays into both nostrils daily.     [provider]  food thickener (THICK IT) POWD Take 1 g by mouth as needed (for meals).    [provider]  Glycopyrrolate-Formoterol (BEVESPI AEROSPHERE) 9-4.8 MCG/ACT AERO Inhale 2 puffs into the lungs 2 (two) times daily.    [provider]  guaiFENesin (ROBITUSSIN) 100 MG/5ML liquid Take 200 mg by mouth 3 (three) times daily as needed for cough.    [provider]  lactose free nutrition (BOOST) LIQD Take 237 mLs by mouth 2 (two) times daily between meals.    [provider]  loperamide (IMODIUM) 2 MG capsule Take 2-4 mg by mouth as needed for diarrhea or loose stools.    [provider]  loratadine (CLARITIN) 10 MG tablet Take 10 mg by mouth daily.    [provider]  magnesium hydroxide (MILK OF  MAGNESIA) 400 MG/5ML suspension Take 15 mLs by mouth daily as needed for mild constipation.    [provider]  phenol (CHLORASEPTIC) 1.4 % LIQD Use as directed 1 spray in the mouth or throat as needed for throat irritation / pain.    [provider]  polyethylene glycol (MIRALAX / GLYCOLAX) packet Take 17 g by mouth daily as needed for mild constipation or moderate constipation.     [provider]  risperidone (RISPERDAL) 4 MG tablet Take 4 mg by mouth at bedtime.     [provider]  traZODone (DESYREL) 50 MG tablet Take 1 tablet (50 mg total) by mouth at bedtime. 03/11/18   Loletha Grayer, MD     Family History  Adopted: Yes  Problem Relation Age of Onset   Diabetes Maternal Grandmother    Heart attack Maternal Aunt  Prostate cancer Neg Hx    Bladder Cancer Neg Hx    Kidney cancer Neg Hx     Social History   Socioeconomic History   Marital status: Single    Spouse name: Not on file   Number of children: Not on file   Years of education: Not on file   Highest education level: Not on file  Occupational History   Not on file  Social Needs   Financial resource strain: Not on file   Food insecurity    Worry: Not on file    Inability: Not on file   Transportation needs    Medical: Not on file    Non-medical: Not on file  Tobacco Use   Smoking status: Current Some Day Smoker    Packs/day: 0.50    Years: 39.00    Pack years: 19.50    Types: Cigarettes   Smokeless tobacco: Never Used   Tobacco comment: pt is still smoking  Substance and Sexual Activity   Alcohol use: Not Currently   Drug use: No   Sexual activity: Not on file  Lifestyle   Physical activity    Days per week: Not on file    Minutes per session: Not on file   Stress: Not on file  Relationships   Social connections    Talks on phone: Not on file    Gets together: Not on file    Attends religious service: Not on file    Active member of club  or organization: Not on file    Attends meetings of clubs or organizations: Not on file    Relationship status: Not on file  Other Topics Concern   Not on file  Social History Narrative   Not on file     Review of Systems  Constitutional: Negative.   Gastrointestinal: Negative.   Genitourinary: Negative.       Physical Exam No direct physical exam was performed  Vital Signs: There were no vitals taken for this visit.  Imaging: Mr Abdomen Wwo Contrast  Result Date: 03/02/2019 CLINICAL DATA:  Cryoablation for right renal mass. EXAM: MRI ABDOMEN WITHOUT AND WITH CONTRAST TECHNIQUE: Multiplanar multisequence MR imaging of the abdomen was performed both before and after the administration of intravenous contrast. CONTRAST:  7 cc Gadavist COMPARISON:  09/28/2018 FINDINGS: Exam is mildly motion degraded. Lower chest: Unremarkable. Hepatobiliary: No focal enhancing lesion within the liver. Gallbladder surgically absent. No intrahepatic or extrahepatic biliary dilation. Pancreas: No focal mass lesion. No dilatation of the main duct. No intraparenchymal cyst. No peripancreatic edema. No splenomegaly. Spleen:  No splenomegaly. No focal mass lesion. Adrenals/Urinary Tract: 1.5 cm left adrenal nodule is difficult to assess on T1 imaging due to motion artifact, but this is stable comparing back to 04/19/2018 and likely an adenoma. Thickening of the right adrenal gland is stable. Ablation defect noted upper pole right kidney, corresponding to the location of the mildly exophytic right upper pole renal lesion noted on prior study. As expected, this has T1 shortening on precontrast imaging compatible with proteinaceous debris/chronic blood products from ablation. While there is no central or gross enhancement in the ablation bed, delayed imaging best demonstrates some marginal enhancement along the cranial and medial ablation bed (well seen delayed postcontrast image 31 of series 21 and corresponding  subtraction image 31 of series 22). Multiple simple (Bosniak I) cysts are noted in the right kidney. There is a dominant complex, septated central cystic lesion in the interpolar region extending into the  lower pole. There is a septal component measuring up to almost 4 mm focally. Imaging features are stable and remain compatible with previous classification as Bosniak II F. Small simple cyst anterior interpolar left kidney is stable. Stomach/Bowel: Stomach is unremarkable. No gastric wall thickening. No evidence of outlet obstruction. Duodenum is normally positioned as is the ligament of Treitz. No small bowel or colonic dilatation within the visualized abdomen. Vascular/Lymphatic: No abdominal aortic aneurysm. No abdominal lymphadenopathy. Other:  No intraperitoneal free fluid. Musculoskeletal: No abnormal marrow enhancement within the visualized bony anatomy. IMPRESSION: 1. Mildly motion degraded study. 2. Status post cryoablation of the mildly exophytic right upper pole renal mass. Expected appearance of ablation bed on today's post treatment study with a small area of peripheral enhancement seen along the cranial and upper medial margin of the treatment bed. This enhancement may well be due to granulation tissue, but attention on follow-up recommended to exclude residual/recurrent disease. 3. Stable appearance of a Bosniak II F cyst in the mid/lower right kidney. 4. Simple cysts again noted in the kidneys bilaterally. Electronically Signed   By: Misty Stanley M.D.   On: 03/02/2019 14:57    Labs:  CBC: Recent Labs    07/05/18 0756 08/03/18 1357 11/22/18 1030 02/12/19 2254  WBC 7.6 4.8 3.6* 7.8  HGB 12.9* 12.9* 13.7 12.4*  HCT 41.2 41.6 44.3 39.4  PLT 100* 106* 88* 79*    COAGS: Recent Labs    11/22/18 1030  INR 1.0    BMP: Recent Labs    08/03/18 1357 09/28/18 1023 11/22/18 1030 11/25/18 0314 02/12/19 2254  NA 142  --  141 140 134*  K 4.1  --  3.9 4.2 4.0  CL 108  --  107 103  103  CO2 26  --  28 29 23   GLUCOSE 114*  --  120* 143* 121*  BUN 20  --  19 19 24*  CALCIUM 9.3  --  9.3 9.0 8.8*  CREATININE 0.98 1.30* 0.95 1.14 1.65*  GFRNONAA >60  --  >60 >60 46*  GFRAA >60  --  >60 >60 53*    LIVER FUNCTION TESTS: Recent Labs    03/08/18 1259 07/05/18 0756 08/03/18 1357  BILITOT 0.7 1.1 0.6  AST 28 30 35  ALT 26 26 39  ALKPHOS 53 46 60  PROT 8.8* 7.5 7.3  ALBUMIN 4.6 3.9 3.8    TUMOR MARKERS: No results for input(s): AFPTM, CEA, CA199, CHROMGRNA in the last 8760 hours.  Assessment and Plan:  58 year old male with right renal cell carcinoma that was treated with a CT-guided cryoablation on 11/24/2018.  Patient tolerated the procedure well and no symptoms related to the procedure.  MRI was performed yesterday and demonstrated post ablation changes at the right renal lesion.  There is indeterminate vascularity along the periphery of the lesion which could represent granulation tissue but residual or recurrent disease cannot be excluded at this time.  Went over the MRI findings with the patient's sister and the patient's caretaker.  We discussed the possibility of residual tumor based on the large size of the lesion prior to treatment.  However, I am optimistic that the enhancement on yesterday's MRI is related to granulation tissue rather than residual tumor.  As a result, we will get a follow-up MRI of the abdomen, with and without contrast, in 6 months to follow-up the post ablation changes and this area of indeterminate enhancement.  In addition, patient has multiple renal cysts which appear stable.  Again noted is a large fluid-filled structure in the right kidney lower pole which has been reported as a cyst but based on the cryoablation imaging, I suspect this is related to a chronically obstructed lower pole calyx.  Plan for follow-up MRI in 6 months.   Electronically Signed: Burman Riis 03/03/2019, 9:24 AM   I spent a total of    15 Minutes in remote   clinical consultation, greater than 50% of which was counseling/coordinating care for renal cell carcinoma.    Visit type: Audio only (telephone). Audio (no video) only due to patient's lack of internet/smartphone capability. Alternative for in-person consultation at [facility], [address] [place], [place], [place]. This visit type was conducted due to national recommendations for restrictions regarding the COVID-19 Pandemic (e.g. social distancing).  This format is felt to be most appropriate for this patient at this time.  All issues noted in this document were discussed and addressed.

## 2019-03-12 ENCOUNTER — Other Ambulatory Visit: Payer: Self-pay | Admitting: *Deleted

## 2019-03-12 DIAGNOSIS — Z20822 Contact with and (suspected) exposure to covid-19: Secondary | ICD-10-CM

## 2019-03-18 LAB — NOVEL CORONAVIRUS, NAA: SARS-CoV-2, NAA: NOT DETECTED

## 2019-03-21 ENCOUNTER — Telehealth: Payer: Self-pay | Admitting: Family Medicine

## 2019-03-21 NOTE — Telephone Encounter (Signed)
Negative covid result was given Patient expressed understanding.

## 2019-03-28 ENCOUNTER — Other Ambulatory Visit: Payer: Self-pay

## 2019-03-28 ENCOUNTER — Encounter: Payer: Self-pay | Admitting: Oncology

## 2019-03-28 ENCOUNTER — Inpatient Hospital Stay: Payer: Medicare Other | Attending: Oncology

## 2019-03-28 ENCOUNTER — Inpatient Hospital Stay (HOSPITAL_BASED_OUTPATIENT_CLINIC_OR_DEPARTMENT_OTHER): Payer: Medicare Other | Admitting: Oncology

## 2019-03-28 VITALS — BP 95/67 | HR 79 | Temp 98.6°F | Wt 188.0 lb

## 2019-03-28 DIAGNOSIS — Q909 Down syndrome, unspecified: Secondary | ICD-10-CM | POA: Diagnosis not present

## 2019-03-28 DIAGNOSIS — F17210 Nicotine dependence, cigarettes, uncomplicated: Secondary | ICD-10-CM

## 2019-03-28 DIAGNOSIS — G40909 Epilepsy, unspecified, not intractable, without status epilepticus: Secondary | ICD-10-CM | POA: Diagnosis not present

## 2019-03-28 DIAGNOSIS — J449 Chronic obstructive pulmonary disease, unspecified: Secondary | ICD-10-CM

## 2019-03-28 DIAGNOSIS — E039 Hypothyroidism, unspecified: Secondary | ICD-10-CM | POA: Diagnosis not present

## 2019-03-28 DIAGNOSIS — Z79899 Other long term (current) drug therapy: Secondary | ICD-10-CM

## 2019-03-28 DIAGNOSIS — F329 Major depressive disorder, single episode, unspecified: Secondary | ICD-10-CM | POA: Insufficient documentation

## 2019-03-28 DIAGNOSIS — Z7982 Long term (current) use of aspirin: Secondary | ICD-10-CM | POA: Diagnosis not present

## 2019-03-28 DIAGNOSIS — D696 Thrombocytopenia, unspecified: Secondary | ICD-10-CM

## 2019-03-28 DIAGNOSIS — E559 Vitamin D deficiency, unspecified: Secondary | ICD-10-CM | POA: Insufficient documentation

## 2019-03-28 DIAGNOSIS — Z8673 Personal history of transient ischemic attack (TIA), and cerebral infarction without residual deficits: Secondary | ICD-10-CM | POA: Diagnosis not present

## 2019-03-28 DIAGNOSIS — D649 Anemia, unspecified: Secondary | ICD-10-CM | POA: Insufficient documentation

## 2019-03-28 LAB — COMPREHENSIVE METABOLIC PANEL
ALT: 22 U/L (ref 0–44)
AST: 24 U/L (ref 15–41)
Albumin: 3.6 g/dL (ref 3.5–5.0)
Alkaline Phosphatase: 50 U/L (ref 38–126)
Anion gap: 3 — ABNORMAL LOW (ref 5–15)
BUN: 19 mg/dL (ref 6–20)
CO2: 26 mmol/L (ref 22–32)
Calcium: 9 mg/dL (ref 8.9–10.3)
Chloride: 110 mmol/L (ref 98–111)
Creatinine, Ser: 1.13 mg/dL (ref 0.61–1.24)
GFR calc Af Amer: >60 mL/min (ref >60)
GFR calc non Af Amer: >60 mL/min (ref >60)
Glucose, Bld: 108 mg/dL — ABNORMAL HIGH (ref 70–99)
Potassium: 3.9 mmol/L (ref 3.5–5.1)
Sodium: 139 mmol/L (ref 135–145)
Total Bilirubin: 0.5 mg/dL (ref 0.3–1.2)
Total Protein: 7.2 g/dL (ref 6.5–8.1)

## 2019-03-28 LAB — CBC WITH DIFFERENTIAL/PLATELET
Abs Immature Granulocytes: 0 10*3/uL (ref 0.00–0.07)
Basophils Absolute: 0 10*3/uL (ref 0.0–0.1)
Basophils Relative: 0 %
Eosinophils Absolute: 0.4 10*3/uL (ref 0.0–0.5)
Eosinophils Relative: 9 %
HCT: 37.2 % — ABNORMAL LOW (ref 39.0–52.0)
Hemoglobin: 12 g/dL — ABNORMAL LOW (ref 13.0–17.0)
Immature Granulocytes: 0 %
Lymphocytes Relative: 44 %
Lymphs Abs: 2.2 10*3/uL (ref 0.7–4.0)
MCH: 28.3 pg (ref 26.0–34.0)
MCHC: 32.3 g/dL (ref 30.0–36.0)
MCV: 87.7 fL (ref 80.0–100.0)
Monocytes Absolute: 0.5 10*3/uL (ref 0.1–1.0)
Monocytes Relative: 10 %
Neutro Abs: 1.9 10*3/uL (ref 1.7–7.7)
Neutrophils Relative %: 37 %
Platelets: 92 10*3/uL — ABNORMAL LOW (ref 150–400)
RBC: 4.24 MIL/uL (ref 4.22–5.81)
RDW: 12.7 % (ref 11.5–15.5)
WBC: 5 10*3/uL (ref 4.0–10.5)
nRBC: 0 % (ref 0.0–0.2)

## 2019-03-28 NOTE — Progress Notes (Signed)
[facility] follow up Visit:  Patient Care Team: [name], FNP as PCP - General (Family Medicine) Deboraha Sprang, MD as PCP - Cardiology (Cardiology)  REASON FOR VISIT Follow up for thrombocytopenia.   HISTORY OF PRESENTING ILLNESS: Mario Liu 58 y.o. male is here for evaluation of low platelet count. He has Down syndrome.  The patient lives in a group home and is accompanied by caregiver from group home. He is referred to [place] for evaluation of thrombocytopenia. He had labs on with primary care provider on December 15 2016 which showed WBC 5.3 hemoglobin 14.3 MCV 88 RDW 13.8 MPV 11.37 platelet 111 lymphocyte 46.69% absolute lymphocytes 2.5 normal is BMPnormal LFT. It was noted that hepatitis C antibody reactive. Patient was seen by [facility] for workupof her positive hepatitis C antibody Further tests including HCV RNA by PCR was negative.  Hep B core antibody negative, Hb surface antibody nonreactive,A antibody positive,Hep A IgM negative. So that patient did not really have hepatitis C, additional ultrasound and fibrocystic was canceled by GI.Marland Kitchen Patient reports feeling well, good energy level, good appetite, denies any weight loss fatigue, fever or chills. Patient is a poor historian. He told me that he used to drink liquor but haven't drink lately. In September 2018, patient had a stroke, acute ischemic left MCA stroke. His in getting physical therapy. Denies any easy bruising or bleeding. He appears to be less duct if compared to prior to the stroke. Able to have simple conversation with me.Denies any recent infection, bleeding, surgery. He has some dysphagia and speech intelligibility which slowly improving with the physical therapy and speech therapy according to the group home staff.  admitted from 07/05/2018 to 07/07/2018 due to after the mental status. He had acute cystitis urine culture grow strep  agalactiae.  Patient was treated with IV antibiotics  and switched to oral Augmentin. Acute kidney injury/dehydration resolved after hydration.    INTERVAL HISTORY Patient presents to clinic for all follow-up of thrombocytopenia.  Patient has a history of chronic cognitive impairment. Not able to report any history. He denies any pain today.  He is eating snacks Lives in group home.    Patient was recently  Review of Systems  Unable to perform ROS: Other (Chronic cognitive impairment.)    MEDICAL HISTORY: Past Medical History:  Diagnosis Date  . Acute ischemic left MCA stroke ([place])   . Altered mental status 03/09/2018  . Baker's cyst of knee, left 2018  . Bilateral renal cysts   . COPD (chronic obstructive pulmonary disease) ([place])   . DDD (degenerative disc disease), lumbar   . Down's syndrome   . GERD (gastroesophageal reflux disease)   . Hepatitis C   . Hepatitis C antibody test positive 05/16/2017  . Hx of cholecystectomy   . Hypothyroidism   . Kidney cysts   . Lumbar spondylosis   . Major depressive disorder    Was treated at facility in [place] [facility] years ago  . PFO with atrial septal aneurysm   . PONV (postoperative nausea and vomiting)   . Recurrent falls 03/01/2018  . Right kidney mass   . Seizure disorder ([place]) 11/24/2017  . Thrombocytopenia ([place])   . Tobacco abuse   . Vitamin D deficiency     SURGICAL HISTORY: Past Surgical History:  Procedure Laterality Date  . IR RADIOLOGIST EVAL & MGMT  10/13/2018  . IR RADIOLOGIST EVAL & MGMT  03/03/2019  . LAPAROSCOPIC CHOLECYSTECTOMY    .  RADIOLOGY WITH ANESTHESIA Right 11/24/2018   Procedure: CT WITH ANESTHESIA RENAL CRYOABLATION WITH BIOPSY;  Surgeon: Markus Daft, MD;  Location: [facility];  Service: Anesthesiology;  Laterality: Right;    SOCIAL HISTORY: Social History   Socioeconomic History  . Marital status: Single    Spouse name: Not on file  . Number of children: Not on file  . Years of education: Not on file  . Highest education level: Not on file   Occupational History  . Not on file  Social Needs  . Financial resource strain: Not on file  . Food insecurity    Worry: Not on file    Inability: Not on file  . Transportation needs    Medical: Not on file    Non-medical: Not on file  Tobacco Use  . Smoking status: Current Some Day Smoker    Packs/day: 0.50    Years: 39.00    Pack years: 19.50    Types: Cigarettes  . Smokeless tobacco: Never Used  . Tobacco comment: pt is still smoking  Substance and Sexual Activity  . Alcohol use: Not Currently  . Drug use: No  . Sexual activity: Not on file  Lifestyle  . Physical activity    Days per week: Not on file    Minutes per session: Not on file  . Stress: Not on file  Relationships  . Social Herbalist on phone: Not on file    Gets together: Not on file    Attends religious service: Not on file    Active member of club or organization: Not on file    Attends meetings of clubs or organizations: Not on file    Relationship status: Not on file  . Intimate partner violence    Fear of current or ex partner: Not on file    Emotionally abused: Not on file    Physically abused: Not on file    Forced sexual activity: Not on file  Other Topics Concern  . Not on file  Social History Narrative  . Not on file    FAMILY HISTORY Family History  Adopted: Yes  Problem Relation Age of Onset  . Diabetes Maternal Grandmother   . Heart attack Maternal Aunt   . Prostate cancer Neg Hx   . Bladder Cancer Neg Hx   . Kidney cancer Neg Hx     ALLERGIES:  has No Known Allergies.  MEDICATIONS:  Current Outpatient Medications  Medication Sig Dispense Refill  . acetaminophen (TYLENOL) 325 MG tablet Take 650 mg by mouth every 6 (six) hours as needed for mild pain or fever.    Marland Kitchen albuterol (PROVENTIL HFA;VENTOLIN HFA) 108 (90 Base) MCG/ACT inhaler Inhale 1-2 puffs into the lungs every 4 (four) hours as needed for wheezing or shortness of breath.     Marland Kitchen alendronate (FOSAMAX) 70 MG  tablet Take 70 mg by mouth once a week. Fridays    . alum & mag hydroxide-simeth (GERI-LANTA) 200-200-20 MG/5ML suspension Take 15 mLs by mouth every 6 (six) hours as needed for indigestion or heartburn.    Marland Kitchen aspirin EC 81 MG tablet Take 1 tablet (81 mg total) by mouth daily. 90 tablet 3  . atorvastatin (LIPITOR) 40 MG tablet Take 40 mg by mouth at bedtime.     . benztropine (COGENTIN) 1 MG tablet Take 1 mg by mouth at bedtime.    Marland Kitchen buPROPion (WELLBUTRIN XL) 150 MG 24 hr tablet Take 150 mg by mouth daily.     Marland Kitchen  calcium carbonate (OS-CAL) 600 MG TABS tablet Take 600 mg by mouth daily with breakfast.     . cholecalciferol (VITAMIN D) 1000 units tablet Take 1,000 Units by mouth daily.    . citalopram (CELEXA) 40 MG tablet Take 40 mg by mouth daily.     . clonazePAM (KLONOPIN) 0.5 MG tablet Take 0.5 mg by mouth 3 (three) times daily as needed (for agitation).    . clopidogrel (PLAVIX) 75 MG tablet Take 75 mg daily by mouth.    . Dextromethorphan-guaiFENesin (ROBAFEN DM) 10-100 MG/5ML liquid Take 5 mLs by mouth every 12 (twelve) hours as needed (for cough).    . dimenhyDRINATE (DRAMAMINE) 50 MG tablet Take 50 mg by mouth every 8 (eight) hours as needed for nausea.    Marland Kitchen donepezil (ARICEPT) 5 MG tablet Take 5 mg by mouth at bedtime.    . fluticasone (FLONASE) 50 MCG/ACT nasal spray Place 2 sprays into both nostrils daily.     . food thickener (THICK IT) POWD Take 1 g by mouth as needed (for meals).    . Glycopyrrolate-Formoterol (BEVESPI AEROSPHERE) 9-4.8 MCG/ACT AERO Inhale 2 puffs into the lungs 2 (two) times daily.    Marland Kitchen guaiFENesin (ROBITUSSIN) 100 MG/5ML liquid Take 200 mg by mouth 3 (three) times daily as needed for cough.    . lactose free nutrition (BOOST) LIQD Take 237 mLs by mouth 2 (two) times daily between meals.    Marland Kitchen loperamide (IMODIUM) 2 MG capsule Take 2-4 mg by mouth as needed for diarrhea or loose stools.    Marland Kitchen loratadine (CLARITIN) 10 MG tablet Take 10 mg by mouth daily.    . magnesium  hydroxide (MILK OF MAGNESIA) 400 MG/5ML suspension Take 15 mLs by mouth daily as needed for mild constipation.    . phenol (CHLORASEPTIC) 1.4 % LIQD Use as directed 1 spray in the mouth or throat as needed for throat irritation / pain.    . polyethylene glycol (MIRALAX / GLYCOLAX) packet Take 17 g by mouth daily as needed for mild constipation or moderate constipation.     . risperidone (RISPERDAL) 4 MG tablet Take 4 mg by mouth at bedtime.     . traZODone (DESYREL) 50 MG tablet Take 1 tablet (50 mg total) by mouth at bedtime.     Current Facility-Administered Medications  Medication Dose Route Frequency Provider Last Rate Last Dose  . lacosamide (VIMPAT) tablet 200 mg  200 mg Oral BID Garvin Fila, MD        PHYSICAL EXAMINATION: ECOG PERFORMANCE STATUS: 1 - Symptomatic but completely ambulatory  Vital sign see chart.  Physical Exam  Constitutional: No distress.  HENT:  Head: Normocephalic and atraumatic.  Nose: Nose normal.  Mouth/Throat: Oropharynx is clear and moist. No oropharyngeal exudate.  Eyes: Pupils are equal, round, and reactive to light. EOM are normal. No scleral icterus.  Neck: Normal range of motion. Neck supple.  Cardiovascular: Normal rate and regular rhythm.  No murmur heard. Pulmonary/Chest: Effort normal. No respiratory distress. He has no rales. He exhibits no tenderness.  Abdominal: Soft. He exhibits no distension. There is no abdominal tenderness.  Musculoskeletal: Normal range of motion.        General: No edema.  Neurological: He is alert. No cranial nerve deficit. He exhibits normal muscle tone. Coordination normal.  Follow simple commands.  At baseline mental status.  Skin: Skin is warm and dry. He is not diaphoretic. No erythema.  Psychiatric: Affect normal.  Nursing note and vitals reviewed.  LABORATORY DATA: I have personally reviewed the data as listed: He had labs on with primary care provider on December 15 2016 which showed WBC 5.3 hemoglobin 14.3  MCV 88 RDW 13.8 MPV 11.37 platelet 111 lymphocyte 46.69% absolute lymphocytes 2.5 normal is BMPnormal LFT. It was noted that hepatitis C antibody reactive. Patient was seen by [facility] for workupof her positive hepatitis C antibody Further tests including HCV RNA by PCR was negative.  Hep B core antibody negative, Hb surface antibody nonreactive,A antibody positive,Hep A IgM negative. CBC    Component Value Date/Time   WBC 5.0 03/28/2019 1350   RBC 4.24 03/28/2019 1350   HGB 12.0 (L) 03/28/2019 1350   HGB 13.6 04/07/2018 1320   HCT 37.2 (L) 03/28/2019 1350   HCT 42.6 04/07/2018 1320   PLT 92 (L) 03/28/2019 1350   PLT 121 (L) 04/07/2018 1320   MCV 87.7 03/28/2019 1350   MCV 86 04/07/2018 1320   MCH 28.3 03/28/2019 1350   MCHC 32.3 03/28/2019 1350   RDW 12.7 03/28/2019 1350   RDW 12.2 (L) 04/07/2018 1320   LYMPHSABS 2.2 03/28/2019 1350   LYMPHSABS 2.3 04/07/2018 1320   MONOABS 0.5 03/28/2019 1350   EOSABS 0.4 03/28/2019 1350   EOSABS 0.4 04/07/2018 1320   BASOSABS 0.0 03/28/2019 1350   BASOSABS 0.0 04/07/2018 1320   CMP Latest Ref Rng & Units 03/28/2019 02/12/2019 11/25/2018  Glucose 70 - 99 mg/dL 108(H) 121(H) 143(H)  BUN 6 - 20 mg/dL 19 24(H) 19  Creatinine 0.61 - 1.24 mg/dL 1.13 1.65(H) 1.14  Sodium 135 - 145 mmol/L 139 134(L) 140  Potassium 3.5 - 5.1 mmol/L 3.9 4.0 4.2  Chloride 98 - 111 mmol/L 110 103 103  CO2 22 - 32 mmol/L 26 23 29   Calcium 8.9 - 10.3 mg/dL 9.0 8.8(L) 9.0  Total Protein 6.5 - 8.1 g/dL 7.2 - -  Total Bilirubin 0.3 - 1.2 mg/dL 0.5 - -  Alkaline Phos 38 - 126 U/L 50 - -  AST 15 - 41 U/L 24 - -  ALT 0 - 44 U/L 22 - -    Peripheral lab flow cytometry No monoclonal B cell population is detected. kappa:lambda ratio 1.8  There is no loss of, or aberrant expression of, the pan T cell  antigens to  suggest a neoplastic T cell process.  CD4:CD8 ratio 2.5  No circulating blasts are detected. There is no immunophenotypic  evidence  of abnormal  myeloid maturation.  Analysis of the leukocyte population shows: granulocytes 50%,  monocytes  10%, lymphocytes 40%, blasts <0.5%, B cells 8%, T cells 30%, NK cells  2%.    RADIOGRAPHIC STUDIES: I have personally reviewed the radiological images as listed and agree with the findings in the report US abdomen 04/23/2017 FINDINGS: Gallbladder: Surgically absent.Common bile duct: Diameter: 3 mm. No intrahepatic, common hepatic, common bile duct dilatation.Liver: No focal lesion identified. Within normal limits inparench ymal echogenicity. IVC: No abnormality visualized in areas which can be interrogated.Portions of the infrahepatic inferior vena cava are obscured by gas. Pancreas: Pancreas essentially completely obscured by gas. Spleen: Size and appearance within normal limits. Spleen measures 9.1 x 10.0 x 3.9 cm with a measured splenic volume of 186 cubic cm. Right Kidney: Length: 10.8 cm. Echogenicity within normal limits. No hydronephrosis visualized. There is a mildly complex cystic structure in the lower pole the right kidney measuring 4.4 x 2.3 x 2.8 cm, similar to prior study. There is a nearby second mildly complex cystic  structure toward the mid right kidney measuring 1.4 x1.4 x 1.7 cm, essentially stable from prior study. There is a cyst in the midportion of the kidney measuring 2.7 x 2.4 x 2.7 cm. No newrenal lesions evident. Left Kidney: Length: 11.5 cm. Echogenicity within normal limits. No hydronephrosis visualized. There is a simple cyst in the mid left kidney measuring 0.9 x 1.0 x 1.1 cm. Abdominal aorta: No aneurysm visualized in areas which can be interrogated. Portions of the aorta are obscured by gas. Other findings: No demonstrable ascites.  IMPRESSION:1.  No liver or splenic lesions.2.  Gallbladder absent.3. Stable mildly complex cystic areas in the right kidney. No new renal lesion identified on the right. Small simple cyst mid left kidney.4. Pancreas essentially  completely obscured by gas. Portions of inferior vena cava and aorta obscured by gas. Visualized portions ofthese structures appear unremarkable.    ASSESSMENT/PLAN 1. Thrombocytopenia ([place])   2. Normocytic anemia    #Labs are reviewed and discussed with patient.  Summary of this encounter and the recommendation sent to group home. Thrombocytopenia has been stable. Anemia, hemoglobin has been stable. Previously underwent extensive work-up. 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 Since the platelet counts has been stable. Recommend continue monitor q. 6 months . Follow-up in clinic in 6 months.  All questions were answered. The patient knows to call the clinic with any problems, questions or concerns. Total face to face encounter time for this patient visit was 15 min. >50% of the time was  spent in counseling and coordination of care.    Earlie Server, MD, PhD Hematology Oncology [facility] Pager- [phone] 03/28/2019

## 2019-04-16 IMAGING — CT CT HEAD CODE STROKE
3 series · 15 of 45 positions shown, 18 images · non-contrast
Comparison: None.

CLINICAL DATA: Code stroke.  Aphasia.

EXAM:
CT HEAD WITHOUT CONTRAST
TECHNIQUE: Contiguous axial images were obtained from the base of the skull
through the vertex without intravenous contrast.

[Series 2: head wo · axial · 0.39mm/px · z∈[+362,+477]mm · 9 of 28 slices shown, 12 images]
[im 3/28  brain]
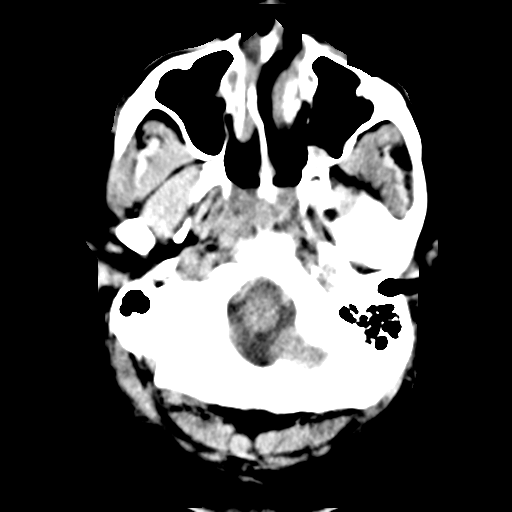
[im 3/28  bone]
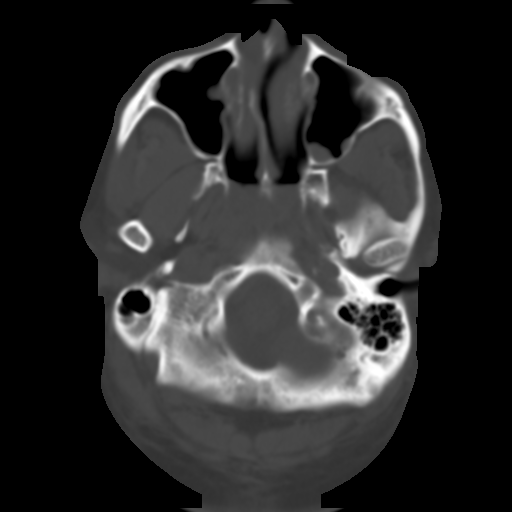
[im 6/28  brain]
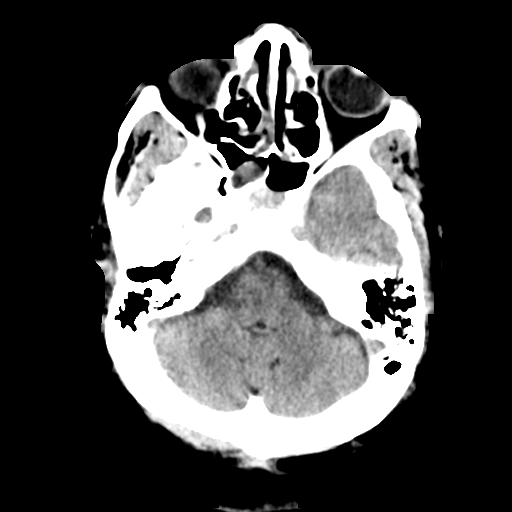
[im 9/28  brain]
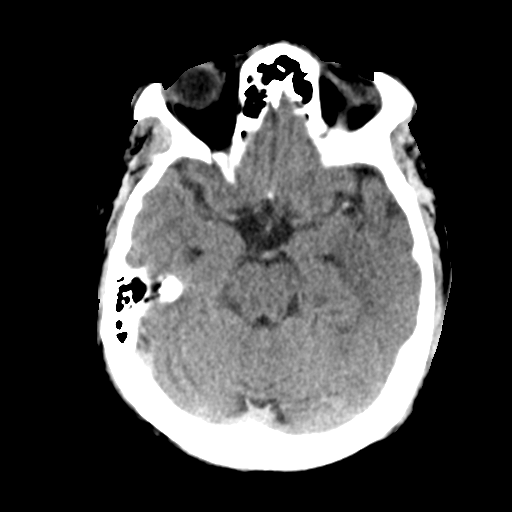
[im 12/28  brain]
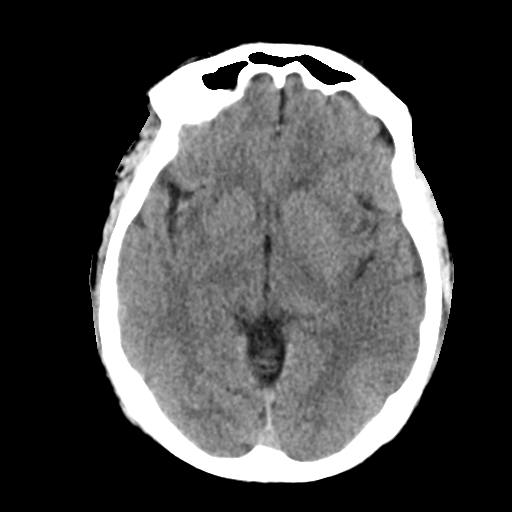
[im 15/28  brain]
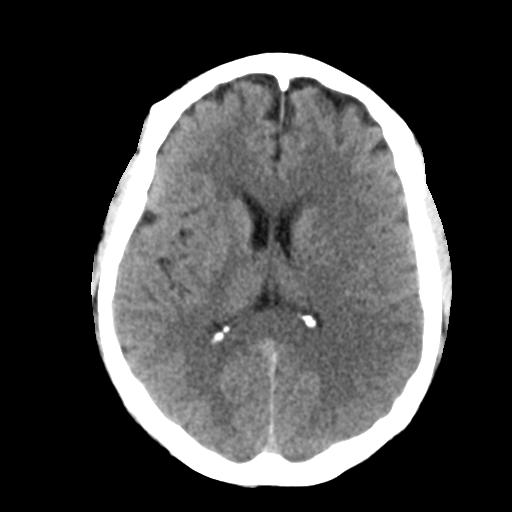
[im 15/28  bone]
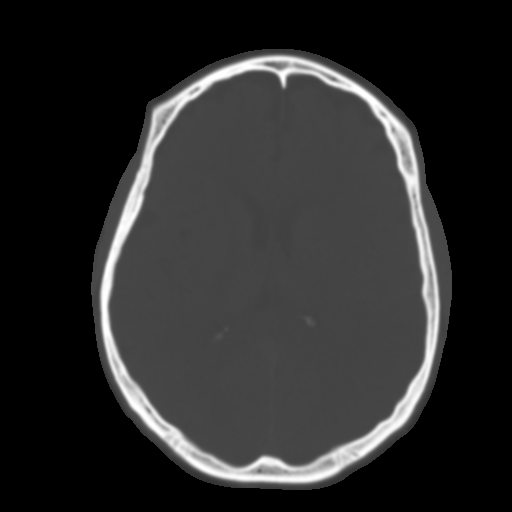
[im 17/28  brain]
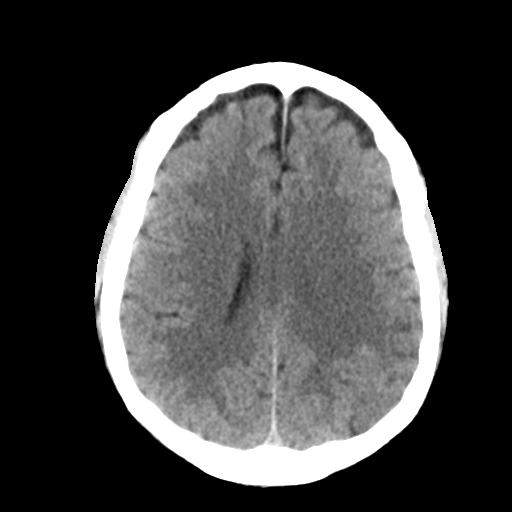
[im 20/28  brain]
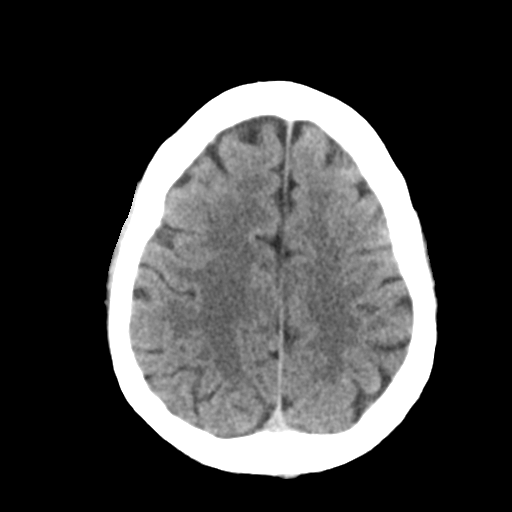
[im 23/28  brain]
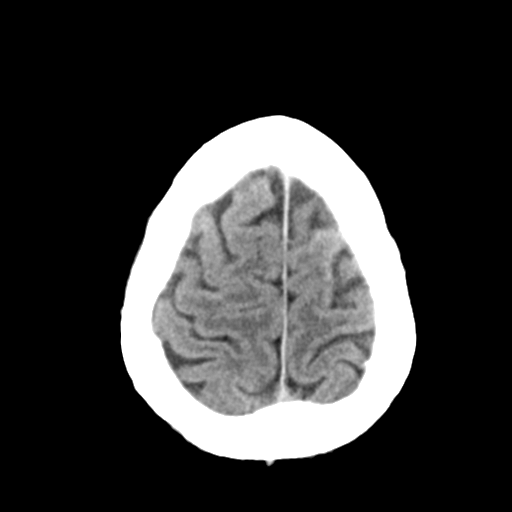
[im 26/28  brain]
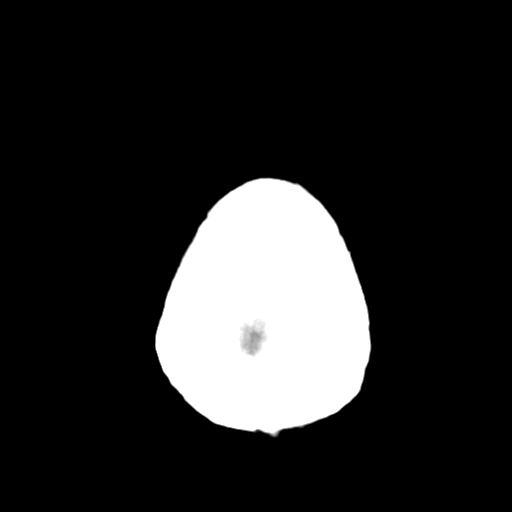
[im 26/28  bone]
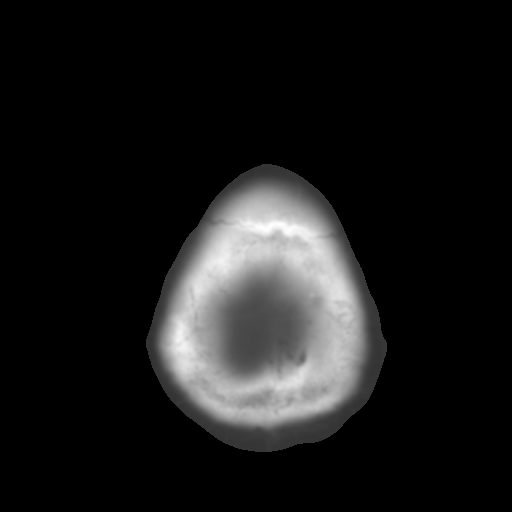

[Series 4: coronal soft tissue · coronal · 0.28mm/px · 3 of 61 slices shown]
[im 21/61  brain]
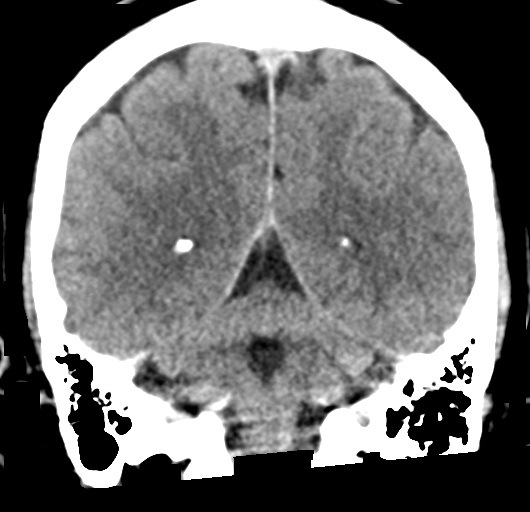
[im 27/61  brain]
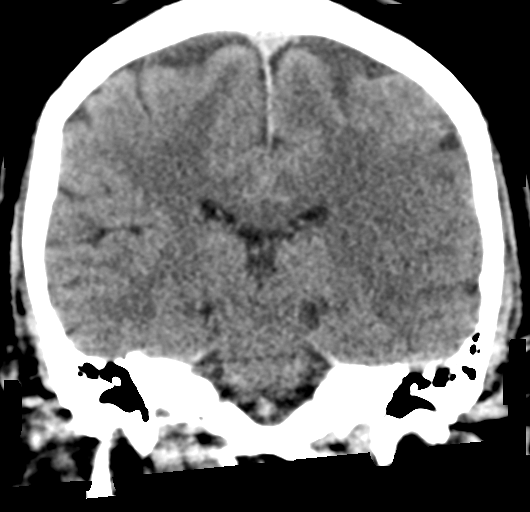
[im 34/61  brain]
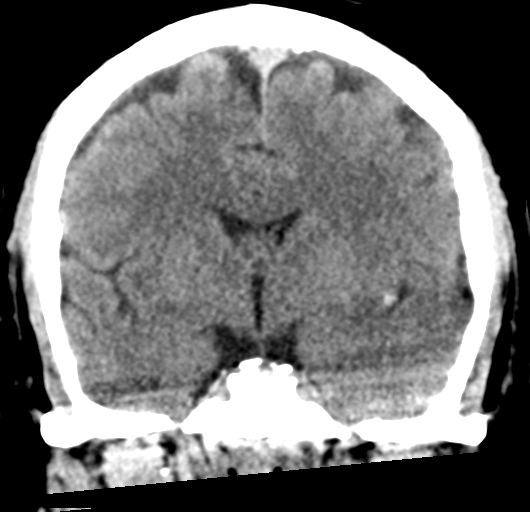

[Series 5: sagittal soft tissue · sagittal · 0.28mm/px · 3 of 50 slices shown]
[im 17/50  brain]
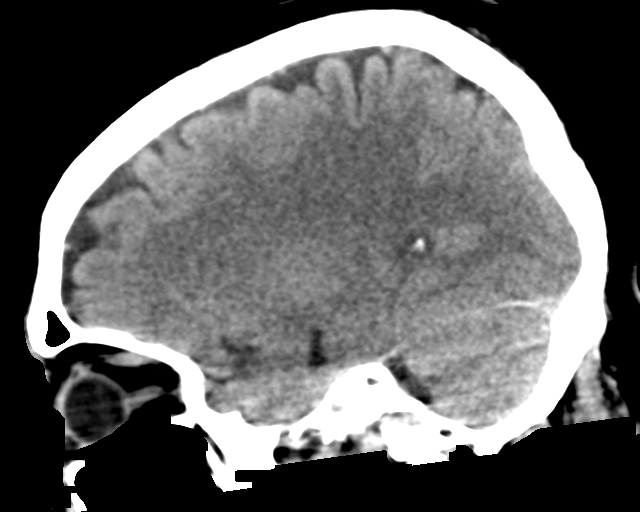
[im 25/50  brain]
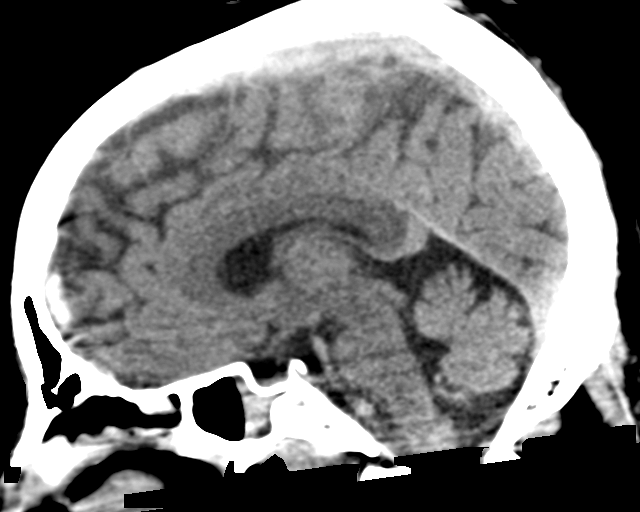
[im 33/50  brain]
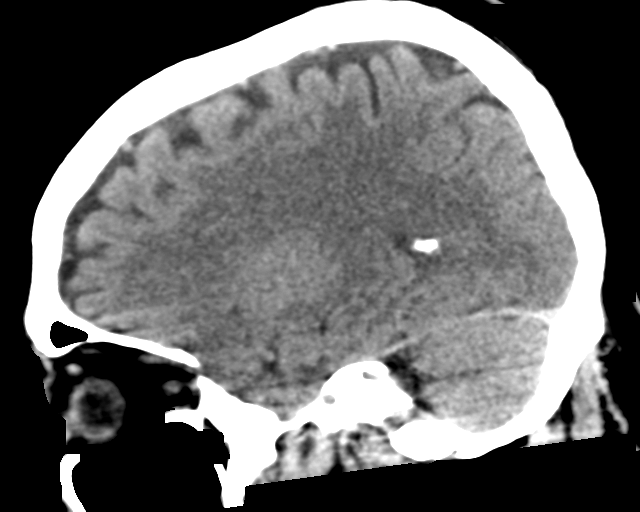

[15 of 45 positions shown; findings below may reference images not displayed]

FINDINGS: Brain: There is cortical and white matter hypoattenuation consistent
with acute infarction involving the left temporal lobe, left
parietal lobe, and left insula. No acute intracranial hemorrhage,
mass, midline shift, or extra-axial fluid collection is present. The
ventricles and sulci are normal. Periventricular white matter
hypoattenuation is nonspecific but may reflect very mild chronic
small vessel ischemic disease.

Vascular: 4 mm hyperdense focus in the left sylvian fissure
consistent with an acute MCA branch vessel embolus. Questionable
small left MCA embolus more proximally near the expected region of
the bifurcation.

Skull: No fracture or focal osseous lesion.

Sinuses/Orbits: Moderate volume fluid in the left sphenoid sinus.
Right sphenoid sinus mucous retention cyst. Partially visualized
left maxillary sinus mucous retention cyst. Mild mucosal thickening
elsewhere in the ethmoid and maxillary sinuses bilaterally. Clear
mastoid air cells. Unremarkable orbits.

Other: None.

ASPECTS ([place] Stroke Program Early CT Score)

- Ganglionic level infarction (caudate, lentiform nuclei, internal
capsule, insula, M1-M3 cortex): 4 (insula, M2, and M3 involvement)

- Supraganglionic infarction (M4-M6 cortex): 2 (M6 involvement)

Total score (0-10 with 10 being normal): 6
IMPRESSION: 1. Moderate-sized acute left MCA infarct. Embolus involving a left
MCA branch vessel in the sylvian fissure. Possible additional
embolus more proximally near the MCA bifurcation.
2. ASPECTS is 6.
3. No acute intracranial hemorrhage.
These results were called by telephone at the time of interpretation
on 05/14/2017 at [DATE] to Dr. TAWNYA MASSO , who verbally
acknowledged these results.

## 2019-06-27 ENCOUNTER — Encounter: Payer: Self-pay | Admitting: Emergency Medicine

## 2019-06-27 ENCOUNTER — Emergency Department
Admission: EM | Admit: 2019-06-27 | Discharge: 2019-06-27 | Disposition: A | Discharge status: Home / Self Care | Payer: Medicare Other | Attending: Student | Admitting: Student

## 2019-06-27 ENCOUNTER — Emergency Department: Payer: Medicare Other

## 2019-06-27 ENCOUNTER — Other Ambulatory Visit: Payer: Self-pay

## 2019-06-27 DIAGNOSIS — Z7982 Long term (current) use of aspirin: Secondary | ICD-10-CM | POA: Diagnosis not present

## 2019-06-27 DIAGNOSIS — E039 Hypothyroidism, unspecified: Secondary | ICD-10-CM | POA: Diagnosis not present

## 2019-06-27 DIAGNOSIS — R531 Weakness: Secondary | ICD-10-CM | POA: Insufficient documentation

## 2019-06-27 DIAGNOSIS — F17210 Nicotine dependence, cigarettes, uncomplicated: Secondary | ICD-10-CM | POA: Insufficient documentation

## 2019-06-27 DIAGNOSIS — J449 Chronic obstructive pulmonary disease, unspecified: Secondary | ICD-10-CM | POA: Insufficient documentation

## 2019-06-27 DIAGNOSIS — Z7902 Long term (current) use of antithrombotics/antiplatelets: Secondary | ICD-10-CM | POA: Diagnosis not present

## 2019-06-27 LAB — URINALYSIS, COMPLETE (UACMP) WITH MICROSCOPIC
Bacteria, UA: NONE SEEN
Bilirubin Urine: NEGATIVE
Glucose, UA: NEGATIVE mg/dL
Hgb urine dipstick: NEGATIVE
Ketones, ur: NEGATIVE mg/dL
Leukocytes,Ua: NEGATIVE
Nitrite: NEGATIVE
Protein, ur: NEGATIVE mg/dL
Specific Gravity, Urine: 1.025 (ref 1.005–1.030)
pH: 6 (ref 5.0–8.0)

## 2019-06-27 LAB — COMPREHENSIVE METABOLIC PANEL
ALT: 24 U/L (ref 0–44)
AST: 26 U/L (ref 15–41)
Albumin: 3.5 g/dL (ref 3.5–5.0)
Alkaline Phosphatase: 48 U/L (ref 38–126)
Anion gap: 4 — ABNORMAL LOW (ref 5–15)
BUN: 16 mg/dL (ref 6–20)
CO2: 25 mmol/L (ref 22–32)
Calcium: 9.2 mg/dL (ref 8.9–10.3)
Chloride: 110 mmol/L (ref 98–111)
Creatinine, Ser: 0.96 mg/dL (ref 0.61–1.24)
GFR calc Af Amer: >60 mL/min (ref >60)
GFR calc non Af Amer: >60 mL/min (ref >60)
Glucose, Bld: 105 mg/dL — ABNORMAL HIGH (ref 70–99)
Potassium: 4.5 mmol/L (ref 3.5–5.1)
Sodium: 139 mmol/L (ref 135–145)
Total Bilirubin: 0.7 mg/dL (ref 0.3–1.2)
Total Protein: 6.6 g/dL (ref 6.5–8.1)

## 2019-06-27 LAB — CBC WITH DIFFERENTIAL/PLATELET
Abs Immature Granulocytes: 0.01 10*3/uL (ref 0.00–0.07)
Basophils Absolute: 0 10*3/uL (ref 0.0–0.1)
Basophils Relative: 1 %
Eosinophils Absolute: 0.2 10*3/uL (ref 0.0–0.5)
Eosinophils Relative: 5 %
HCT: 40.5 % (ref 39.0–52.0)
Hemoglobin: 12.5 g/dL — ABNORMAL LOW (ref 13.0–17.0)
Immature Granulocytes: 0 %
Lymphocytes Relative: 39 %
Lymphs Abs: 1.6 10*3/uL (ref 0.7–4.0)
MCH: 28.1 pg (ref 26.0–34.0)
MCHC: 30.9 g/dL (ref 30.0–36.0)
MCV: 91 fL (ref 80.0–100.0)
Monocytes Absolute: 0.4 10*3/uL (ref 0.1–1.0)
Monocytes Relative: 10 %
Neutro Abs: 1.9 10*3/uL (ref 1.7–7.7)
Neutrophils Relative %: 45 %
Platelets: 96 10*3/uL — ABNORMAL LOW (ref 150–400)
RBC: 4.45 MIL/uL (ref 4.22–5.81)
RDW: 12.2 % (ref 11.5–15.5)
WBC: 4.1 10*3/uL (ref 4.0–10.5)
nRBC: 0 % (ref 0.0–0.2)

## 2019-06-27 NOTE — ED Notes (Signed)
Lab called saying recollect blood was hemolyzed.  Lab states will send someone to recollect.

## 2019-06-27 NOTE — ED Provider Notes (Signed)
[facility] Emergency Department Provider Note  ____________________________________________   None    (approximate)  I have reviewed the triage vital signs and the nursing notes.  History  Chief Complaint Weakness    HPI Mario Liu is a 58 y.o. male with history of Down's syndrome, left MCA stroke, resultant seizures (no longer on AED), hepatitis C who presents from his group home for concern for generalized weakness above baseline. Per his caregiver, this morning she noticed he was having trouble eating and feeding himself which is out of the ordinary. When he was walking she noticed he was seeming to be dragging his feet (she clarifies this was BOTH feet, triage note had only specified the R). No known trauma or injuries. Caregiver did notice he seemed to have urinated on himself and wonders if he might have a UTI. No known seizure activity. On arrival to the ED the patient himself has no complaints.    Past Medical Hx Past Medical History:  Diagnosis Date  . Acute ischemic left MCA stroke ([place])   . Altered mental status 03/09/2018  . Baker's cyst of knee, left 2018  . Bilateral renal cysts   . COPD (chronic obstructive pulmonary disease) ([place])   . DDD (degenerative disc disease), lumbar   . Down's syndrome   . GERD (gastroesophageal reflux disease)   . Hepatitis C   . Hepatitis C antibody test positive 05/16/2017  . Hx of cholecystectomy   . Hypothyroidism   . Kidney cysts   . Lumbar spondylosis   . Major depressive disorder    Was treated at facility in [place] ([facility]) years ago  . PFO with atrial septal aneurysm   . PONV (postoperative nausea and vomiting)   . Recurrent falls 03/01/2018  . Right kidney mass   . Seizure disorder ([place]) 11/24/2017  . Thrombocytopenia ([place])   . Tobacco abuse   . Vitamin D deficiency     Problem List Patient Active Problem List   Diagnosis Date Noted  . Renal lesion 11/24/2018  . Preoperative  cardiovascular examination 10/27/2018  . Hyperlipidemia 10/27/2018  . AMS (altered mental status) 07/07/2018  . Altered mental status 03/09/2018  . Recurrent falls 03/01/2018  . Seizure disorder ([place]) 11/24/2017  . Major depressive disorder   . Hepatitis C antibody test positive 05/16/2017  . Acute ischemic left MCA stroke ([place])   . Down's syndrome   . PFO with atrial septal aneurysm     Past Surgical Hx Past Surgical History:  Procedure Laterality Date  . IR RADIOLOGIST EVAL & MGMT  10/13/2018  . IR RADIOLOGIST EVAL & MGMT  03/03/2019  . LAPAROSCOPIC CHOLECYSTECTOMY    . RADIOLOGY WITH ANESTHESIA Right 11/24/2018   Procedure: CT WITH ANESTHESIA RENAL CRYOABLATION WITH BIOPSY;  Surgeon: Markus Daft, MD;  Location: [facility] ORS;  Service: Anesthesiology;  Laterality: Right;    Medications Prior to Admission medications   Medication Sig Start Date End Date Taking? Authorizing Provider  acetaminophen (TYLENOL) 325 MG tablet Take 650 mg by mouth every 6 (six) hours as needed for mild pain or fever.    [provider]  albuterol (PROVENTIL HFA;VENTOLIN HFA) 108 (90 Base) MCG/ACT inhaler Inhale 1-2 puffs into the lungs every 4 (four) hours as needed for wheezing or shortness of breath.     [provider]  alendronate (FOSAMAX) 70 MG tablet Take 70 mg by mouth once a week. Fridays    [provider]  alum & mag hydroxide-simeth (  GERI-LANTA) 200-200-20 MG/5ML suspension Take 15 mLs by mouth every 6 (six) hours as needed for indigestion or heartburn.    [provider]  aspirin EC 81 MG tablet Take 1 tablet (81 mg total) by mouth daily. 10/27/18   Revankar, Reita Cliche, MD  atorvastatin (LIPITOR) 40 MG tablet Take 40 mg by mouth at bedtime.     [provider]  benztropine (COGENTIN) 1 MG tablet Take 1 mg by mouth at bedtime.    [provider]  buPROPion (WELLBUTRIN XL) 150 MG 24 hr tablet Take 150 mg by mouth daily.     [provider]   calcium carbonate (OS-CAL) 600 MG TABS tablet Take 600 mg by mouth daily with breakfast.     [provider]  cholecalciferol (VITAMIN D) 1000 units tablet Take 1,000 Units by mouth daily.    [provider]  citalopram (CELEXA) 40 MG tablet Take 40 mg by mouth daily.     [provider]  clonazePAM (KLONOPIN) 0.5 MG tablet Take 0.5 mg by mouth 3 (three) times daily as needed (for agitation).    [provider]  clopidogrel (PLAVIX) 75 MG tablet Take 75 mg daily by mouth.    [provider]  Dextromethorphan-guaiFENesin (ROBAFEN DM) 10-100 MG/5ML liquid Take 5 mLs by mouth every 12 (twelve) hours as needed (for cough).    [provider]  dimenhyDRINATE (DRAMAMINE) 50 MG tablet Take 50 mg by mouth every 8 (eight) hours as needed for nausea.    [provider]  donepezil (ARICEPT) 5 MG tablet Take 5 mg by mouth at bedtime.    [provider]  fluticasone (FLONASE) 50 MCG/ACT nasal spray Place 2 sprays into both nostrils daily.     [provider]  food thickener (THICK IT) POWD Take 1 g by mouth as needed (for meals).    [provider]  Glycopyrrolate-Formoterol (BEVESPI AEROSPHERE) 9-4.8 MCG/ACT AERO Inhale 2 puffs into the lungs 2 (two) times daily.    [provider]  guaiFENesin (ROBITUSSIN) 100 MG/5ML liquid Take 200 mg by mouth 3 (three) times daily as needed for cough.    [provider]  lactose free nutrition (BOOST) LIQD Take 237 mLs by mouth 2 (two) times daily between meals.    [provider]  loperamide (IMODIUM) 2 MG capsule Take 2-4 mg by mouth as needed for diarrhea or loose stools.    [provider]  loratadine (CLARITIN) 10 MG tablet Take 10 mg by mouth daily.    [provider]  magnesium hydroxide (MILK OF MAGNESIA) 400 MG/5ML suspension Take 15 mLs by mouth daily as needed for mild constipation.    [provider]  phenol  (CHLORASEPTIC) 1.4 % LIQD Use as directed 1 spray in the mouth or throat as needed for throat irritation / pain.    [provider]  polyethylene glycol (MIRALAX / GLYCOLAX) packet Take 17 g by mouth daily as needed for mild constipation or moderate constipation.     [provider]  risperidone (RISPERDAL) 4 MG tablet Take 4 mg by mouth at bedtime.     [provider]  traZODone (DESYREL) 50 MG tablet Take 1 tablet (50 mg total) by mouth at bedtime. 03/11/18   Loletha Grayer, MD    Allergies Patient has no known allergies.  Family Hx Family History  Adopted: Yes  Problem Relation Age of Onset  . Diabetes Maternal Grandmother   . Heart attack Maternal Aunt   .  Prostate cancer Neg Hx   . Bladder Cancer Neg Hx   . Kidney cancer Neg Hx     Social Hx Social History   Tobacco Use  . Smoking status: Current Some Day Smoker    Packs/day: 0.50    Years: 39.00    Pack years: 19.50    Types: Cigarettes  . Smokeless tobacco: Never Used  . Tobacco comment: pt is still smoking  Substance Use Topics  . Alcohol use: Not Currently  . Drug use: No     Review of Systems  Constitutional: Negative for fever, chills. Eyes: Negative for visual changes. ENT: Negative for sore throat. Cardiovascular: Negative for chest pain. Respiratory: Negative for shortness of breath. Gastrointestinal: Negative for nausea, vomiting.  Genitourinary: Negative for dysuria. Musculoskeletal: Negative for leg swelling. Skin: Negative for rash. Neurological: Negative for for headaches.   Physical Exam  Vital Signs: ED Triage Vitals  Enc Vitals Group     BP 06/27/19 0927 107/74     Pulse Rate 06/27/19 0927 81     Resp 06/27/19 0927 16     Temp 06/27/19 0927 98.1 F (36.7 C)     Temp Source 06/27/19 0927 Oral     SpO2 06/27/19 0927 99 %     Weight --      Height --      Head Circumference --      Peak Flow --      Pain Score 06/27/19 0932 0     Pain Loc --      Pain  Edu? --      Excl. in [place]? --     Constitutional: Alert and oriented. Pleasant and cooperative.  Head: Normocephalic. Atraumatic. Face symmetric.  Eyes: Conjunctivae clear. Sclera anicteric. Nose: No congestion. No rhinorrhea. Mouth/Throat: Mucous membranes are moist. Tongue midline.  Neck: No stridor.   Cardiovascular: Normal rate.  Extremities well perfused. Respiratory: Normal respiratory effort.  Lungs CTAB. Gastrointestinal: Soft. Non-tender. Non-distended.  Musculoskeletal: No lower extremity edema. No deformities. Neurologic:  Mildly garbled speech, which he does have at baseline from his stroke. Equal and symmetric strength x 4. SILT x 4.  Skin: Skin is warm, dry and intact. No rash noted. Psychiatric: Mood and affect are appropriate for situation. Pleasant and interactive.  EKG  Personally reviewed.   Rate: 86 Rhythm: sinus Axis: normal Intervals: WNL No acute ischemic changes No STEMI    Radiology  CT: IMPRESSION:  1. Stable chronic encephalomalacia involving the left temporal and parietal lobes.  2. No acute intracranial abnormality or significant interval change.    Procedures  Procedure(s) performed (including critical care):  Procedures   Initial Impression / Assessment and Plan / ED Course  58 y.o. male who presents to the ED with concern for increased weakness from his baseline  Ddx: UTI, electrolyte abnormality, recurrent CVA, seizure (though none witnessed, but perhaps etiology of his urinating on himself and then generalized weakness)  Plan: labs, urine, CT  Work up unremarkable - labs w/o actionable derangements. No UTI. CT w/o acute changes. He has eaten and tolerated PO here and continues to feel well. As such will plan for discharge home, advised PCP and Neurology outpatient follow up.     Final Clinical Impression(s) / ED Diagnosis  Final diagnoses:  Weakness       Note:  This document was prepared using Dragon voice  recognition software and may include unintentional dictation errors.   Lilia Pro., MD 06/27/19 [phone]

## 2019-06-27 NOTE — ED Notes (Signed)
Pt did not sign d/t having legal guardian.  This RN spoke with [name], legal guardian, over the phone about discharge instructions and answered all questions.

## 2019-06-27 NOTE — ED Notes (Signed)
Per lab, blood sent down hemolyzed. Attempt by this RN to [place] and send unsuccessful. Tiffany RN attempted as well. Les, Medic at bedside to attempt IV and blood. Pt cold, warm blankets given.

## 2019-06-27 NOTE — ED Notes (Signed)
Care Handoff: Report given to Leonides Schanz, RN

## 2019-06-27 NOTE — Discharge Instructions (Signed)
Thank you for letting us take care of you in the emergency department today.   Please continue to take any regular, prescribed medications.   Please follow up with: - Your Neurology doctor - Your primary care doctor to review your ER visit and follow up on your symptoms.   Please return to the ER for any new or worsening symptoms.

## 2019-06-27 NOTE — ED Notes (Signed)
Lab at bedside

## 2019-06-27 NOTE — ED Triage Notes (Signed)
Pt here via EMS from group home with c/o slurred speech-worsening from baseline that began Saturday, pt noted to be dragging his right foot as he walked yesterday, today, generalized weakness. Pt oriented to name, and place. No distress noted. Grips equal and strong bilaterally. Pt did feel this RN touching arms and legs bilaterally. Denies pain.

## 2019-06-28 ENCOUNTER — Telehealth: Payer: Self-pay | Admitting: Adult Health

## 2019-06-28 NOTE — Telephone Encounter (Signed)
NP [email or web address] has called asking for a 6 month f/u for pt The email link is being sent to care worker at facility where pt lives.  NP Anderson Malta gave Mario Liu [phone] [email or web address] The e-mail was sent

## 2019-07-11 ENCOUNTER — Telehealth (INDEPENDENT_AMBULATORY_CARE_PROVIDER_SITE_OTHER): Payer: Medicare Other | Admitting: Adult Health

## 2019-07-11 ENCOUNTER — Encounter: Payer: Self-pay | Admitting: *Deleted

## 2019-07-11 ENCOUNTER — Telehealth: Payer: Self-pay | Admitting: Adult Health

## 2019-07-11 ENCOUNTER — Other Ambulatory Visit: Payer: Self-pay

## 2019-07-11 DIAGNOSIS — R4182 Altered mental status, unspecified: Secondary | ICD-10-CM

## 2019-07-11 DIAGNOSIS — I10 Essential (primary) hypertension: Secondary | ICD-10-CM | POA: Diagnosis not present

## 2019-07-11 DIAGNOSIS — G40909 Epilepsy, unspecified, not intractable, without status epilepticus: Secondary | ICD-10-CM

## 2019-07-11 DIAGNOSIS — R29818 Other symptoms and signs involving the nervous system: Secondary | ICD-10-CM

## 2019-07-11 DIAGNOSIS — Z8673 Personal history of transient ischemic attack (TIA), and cerebral infarction without residual deficits: Secondary | ICD-10-CM | POA: Diagnosis not present

## 2019-07-11 DIAGNOSIS — E785 Hyperlipidemia, unspecified: Secondary | ICD-10-CM

## 2019-07-11 NOTE — Progress Notes (Signed)
[facility] [address] [place]. [place] [address] [phone]       FOLLOW-UP NOTE  [name] Date of Birth:  1961/03/04 Medical Record Number:  [id number]   Virtual Visit via Video Note  I connected with Mario Liu on 07/11/19 at  9:45 AM EDT by a video enabled telemedicine application located at [facility] and verified that I am speaking with the correct person using two identifiers who was located at their own home accompanied by group home staff, Mario Liu.     Visit scheduled by Mario Hum, RN who discussed the limitations of evaluation and management by telemedicine and the availability of in person appointments. The patient expressed understanding and agreed to proceed.   HPI:  Stroke admission 04/2017: Mario Liu is a 57yo male with PMH significant for depression, Down's syndrome (currently living in a group home), COPD, and tobacco use who presents with aphasia since this morning at around 7am. History obtained from group home caregivers. Patient was in his usual state of health last night before he went to bed around 10pm. He was woken this morning and got up to get dressed. He came down to the kitchen and his caregiver noted that he was holding his underwear and a shirt in his hands, was not talking, and kept looking to his left side. No gait abnormalities. Caregivers state that he has not complained of any chest pain, palpitations, trouble breathing, fevers, or other symptoms recently. Patient has never had a stroke before. They called EMS and he was brought to [facility]. Head CT at [facility] demonstrated hypodensity in L MCA territory, consistent with infarct. He was transferred to [facility] for further evaluation and potential intervention. Patient was not administered IV t-PA secondary to unclear time of onset. CT angiogram and perfusion were obtained which showed a small mismatch but given patient's poor baseline functioning he was not considered a  candidate for endovascular treatment. MRI scan of the brain showed a large left MCA infarct with small petechial hemorrhagic transformation. 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 Lower extremity venous Dopplers were negative for DVT. Transthoracic echo showed normal ejection fraction. Bubble study was positive for small PFO/atrial septal defect. LDL cholesterol was elevated to 80 mg percent and hemoglobin A1c was 5.9. It was decided not to consider further workup with a TEE and loop recorder given his poor functional baseline he is not a candidate for PFO closure on anticoagulation. Patient was started on Lipitor and aspirin.   Initial visit 08/17/2017 PS: he has done well. His speech is still impaired and slurred but the can be understood with some difficulty. He is able to return almost back to his previous baseline. He can swallow well but at times chokes if he eats too quickly. His blood pressure seems to be well controlled and today it is [id number]. Is tolerating Lipitor without muscle aches and pains. He has no complaints today.  11/24/2017 visit PS :Patient is seen today for follow-up after his last visit 3 months ago. He is accompanied by his caregiver from the group home. Patient is unable to provide history. I have reviewed notes which accompany him and also I  called and spoke to a nurse at the group home where he stays. Patient apparently was visiting family during Christmas time and had a bunch of seizures. Detailed description is not available. Patient was initially started on Keppra but for some reason was switched to Vimpat. He is currently on 200 mg twice daily  and apparently tolerating it well and has had no witnessed seizures in the group home. He is otherwise in good health with no neurological changes. His tolerating aspirin without any bruising or bleeding. His blood pressure seems well controlled. He has not had any recurrent stroke or TIA symptoms. Patient has not had any brain imaging  or EEG study done for seizures.  Update 05/27/2018: Patient is being seen today for scheduled follow-up visit and is accompanied by group home caregiver.  Since previous appointment, he was seen at [facility] on 03/08/2018 with concerns of altered mental status.  He was seen previously on 03/01/2018 with similar complaints and was diagnosed with a UTI at that time.  UTI negative on 03/08/2018 admission.  MRI head reviewed and was negative for acute abnormality.  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  Patient was discharged back to group home in stable condition.  Patient states overall he has been doing well.  He has not had additional altered mental status episodes since most recent hospitalization.  On the hospitalization of 03/08/2018, Vimpat was discontinued for unclear reasons -possibly due to potential side effects of increased fatigue and altered mental status.  Since thisdiscontinuation, patient has not had any seizure like activity.  He has been more active in his group home.  Continues to take Plavix and Lipitor without side effects for secondary stroke prevention.  He was seen by psychiatry who restarted him on his psychiatric medications.  Denies new or worsening stroke/TIA symptoms or seizure-like activity.  Update via virtual visit 07/11/2019: Mario Liu is a 58 year old male who is being seen today for follow-up.  He is accompanied by his group home caregiver, Mario Liu.  Since prior visit, he has been transition to a different group home in 09/2018.  Since prior visit, he has had multiple ED admissions for generalized weakness and AMS with all work-up unremarkable.  Most recent ED admission on 06/27/2019 due to concerns of generalized weakness and urinating on himself with work-up unremarkable.  Per ED  note, differential diagnosis possible unwitnessed seizure with incontinence and postictal lethargy but no medication changes.  Recommended to follow-up with neurology outpatient.  Prior history of seizures on Vimpat but was discontinued after AMS hospitalization in 02/2018 due to possible side effects.  Per discussion with caregiver, she states he was "out of it" with increased confusion, decreased cognition and lethargy for 2 to 3 days and then resolved spontaneously.  Cognition/memory has been stable per caregiver with occasional incontinence but caregiver believes this is typically due to waiting too long to go to the bathroom.  He will occasionally need help with daily functioning such as getting dressed but for the most part independent.  He continues to ambulate without any recent falls.  She denies any worsening of his cognition or memory.  Denies any medication changes.  Currently on bupropion 150 mg daily, Celexa 40 mg daily, Klonopin 0.5 mg daily as needed for anxiety, Aricept 5 mg nightly, trazodone 50 mg nightly and risperidone 4 mg nightly.  She denies any witnessed seizure activity or seizure type symptoms.  Continues on Plavix and Lipitor for secondary stroke prevention without side effects.  Continues to follow with psychiatry regularly.     ROS:   14 system review of systems is positive for see HPI and all other systems negative  PMH:  Past Medical History:  Diagnosis Date   Acute ischemic left MCA stroke (HCC)    Altered mental status 03/09/2018   Baker's cyst of knee, left 2018   Bilateral renal cysts    COPD (chronic obstructive pulmonary disease) (HCC)    DDD (degenerative disc disease), lumbar    Down's syndrome    GERD (gastroesophageal reflux disease)    Hepatitis C    Hepatitis C antibody test positive 05/16/2017   Hx of cholecystectomy    Hypothyroidism    Kidney cysts    Lumbar spondylosis    Major depressive disorder    Was treated at facility in  [place] ([facility] years ago   PFO with atrial septal aneurysm    PONV (postoperative nausea and vomiting)    Recurrent falls 03/01/2018   Right kidney mass    Seizure disorder ([place]) 11/24/2017   Thrombocytopenia (HCC)    Tobacco abuse    Vitamin D deficiency     Social History:  Social History   Socioeconomic History   Marital status: Single    Spouse name: Not on file   Number of children: Not on file   Years of education: Not on file   Highest education level: Not on file  Occupational History   Not on file  Social Needs   Financial resource strain: Not on file   Food insecurity    Worry: Not on file    Inability: Not on file   Transportation needs    Medical: Not on file    Non-medical: Not on file  Tobacco Use   Smoking status: Current Some Day Smoker    Packs/day: 0.50    Years: 39.00    Pack years: 19.50    Types: Cigarettes   Smokeless tobacco: Never Used   Tobacco comment: pt is still smoking  Substance and Sexual Activity   Alcohol use: Not Currently   Drug use: No   Sexual activity: Not on file  Lifestyle   Physical activity    Days per week: Not on file    Minutes per session: Not on file   Stress: Not on file  Relationships   Social connections    Talks on phone: Not on file    Gets together: Not on file    Attends religious service: Not on file    Active member of club or organization: Not on file    Attends meetings of clubs or organizations: Not on file    Relationship status: Not on file   Intimate partner violence    Fear of current or ex partner: Not on file    Emotionally abused: Not on file    Physically abused: Not on file    Forced sexual activity: Not on file  Other Topics Concern   Not on file  Social History Narrative   Not on file    Medications:   Current Outpatient Medications on File Prior to Visit  Medication Sig Dispense Refill   albuterol (PROVENTIL HFA;VENTOLIN HFA) 108 (90 Base) MCG/ACT  inhaler Inhale 1-2 puffs into the lungs every 4 (four) hours as needed for wheezing or shortness of breath.      alendronate (FOSAMAX) 70 MG tablet Take 70 mg by mouth once a week. Fridays     atorvastatin (LIPITOR) 40 MG tablet Take 40 mg by mouth at bedtime.      benztropine (COGENTIN) 1 MG tablet Take 1 mg by mouth at bedtime.  buPROPion (WELLBUTRIN XL) 150 MG 24 hr tablet Take 150 mg by mouth daily.      clopidogrel (PLAVIX) 75 MG tablet Take 75 mg daily by mouth.     cyanocobalamin (,VITAMIN B-12,) 1000 MCG/ML injection Inject 1,000 mcg into the muscle every 30 (thirty) days.     donepezil (ARICEPT) 5 MG tablet Take 5 mg by mouth at bedtime.     Glycopyrrolate-Formoterol (BEVESPI AEROSPHERE) 9-4.8 MCG/ACT AERO Inhale 2 puffs into the lungs 2 (two) times daily.     loratadine (CLARITIN) 10 MG tablet Take 10 mg by mouth daily.     traZODone (DESYREL) 50 MG tablet Take 1 tablet (50 mg total) by mouth at bedtime. (Patient taking differently: Take 150 mg by mouth at bedtime. )     Current Facility-Administered Medications on File Prior to Visit  Medication Dose Route Frequency Provider Last Rate Last Dose   lacosamide (VIMPAT) tablet 200 mg  200 mg Oral BID Garvin Fila, MD        Allergies:  No Known Allergies  Physical Exam *Limited exam due to visit type*  General: Obese middle-aged pleasant African-American male, seated, in no evident distress  Neurologic Exam Mental Status: Awake and fully alert.  Majority of history provided by group home staff, Mario Liu.  Diminished attention, registration and recall. Speech is slurred but can be understood with some difficulty. Can comprehend midline and one-step commands only. Attention span, concentration and fund of knowledge poor. Mood and affect appropriate and cooperative during the assessment.  Cranial Nerves:  Extraocular movements full without nystagmus. Hearing intact to voice. Facial sensation intact. Face, tongue, palate  moves normally and symmetrically.  Motor: No evidence of weakness per drift assessment Sensory.: intact to light touch Coordination: Rapid alternating movements normal in all extremities. Finger-to-nose and heel-to-shin performed accurately bilaterally. Gait and Station: Deferred Reflexes: UTA     IMAGING/LABS:  CT HEAD WO CONTRAST 11/30/2018 IMPRESSION: 1. Stable prior infarct involving portions of the left temporal and parietal lobes. Slight periventricular small vessel disease. No evident acute infarct. No mass or hemorrhage evident. 2.  Inferior right frontal scalp hematoma.  No evident fracture. 3.  Multiple foci of paranasal sinus disease. 4.  Stable slight arterial vascular calcification.     ASSESSMENT: 58 year old African-American male with history of Down syndrome, cryptogenic left MCA infarct 04/2017, HLD, HTN and seizures who is being seen today for follow-up and transient episodes of AMS with generalized weakness.   PLAN: -Repeat EEG to assess for potential seizure activity -MRI w/wo contrast to assess for underlying etiology causing transient mental status changes -Will hold off on initiating AED at this time especially as he had prior episode of AMS while on Vimpat therapy (discontinued after 02/2018 ED admission).  No witnessed seizure activity.  Advised caregiver to notify office with any additional episodes or witnessed seizure activity. -Discussed with caregiver possibly medication related with polypharmacy.  Advised to make note of when these episodes occur, activity the day prior, any medication changes were received as needed medication and total duration.  He will continue to follow with psychiatry as scheduled -Continue clopidogrel 75 mg daily  and Lipitor for secondary stroke prevention -F/u with PCP regarding your HLD and HTN management -Advised to continue to stay active and maintain a healthy diet -continue to monitor BP at home -Maintain strict control  of hypertension with blood pressure goal below 130/90, diabetes with hemoglobin A1c goal below 6.5% and cholesterol with LDL cholesterol (bad cholesterol) goal below 70  mg/dL. I also advised the patient to eat a healthy diet with plenty of whole grains, cereals, fruits and vegetables, exercise regularly and maintain ideal body weight.  Follow-up in 3 months for in-office visit or call earlier if needed  Greater than 50% of time during this 25 minute nonface-to-face visit was spent on counseling,explanation of diagnosis of stroke, seizures cognitive impairment, planning of further management, discussion with patient and family and coordination of care  Frann Rider, [facility]  [facility] [address] [place], [place] [address]  Phone [phone] Fax [phone] Note: This document was prepared with digital dictation and possible smart phrase technology. Any transcriptional errors that result from this process are unintentional.

## 2019-07-11 NOTE — Telephone Encounter (Signed)
Medicare/medicaid order sent to GI. No auth they will reach out to the patient to schedule.  °

## 2019-07-12 ENCOUNTER — Telehealth: Payer: Self-pay | Admitting: Adult Health

## 2019-07-12 ENCOUNTER — Encounter: Payer: Self-pay | Admitting: Adult Health

## 2019-07-12 NOTE — Progress Notes (Signed)
I agree with the above plan 

## 2019-07-12 NOTE — Telephone Encounter (Signed)
I called patient regarding scheduling an EEG as Mario Billow NP ordered. I was unable to reach patient or leave a voicemail. I contacted patient's sister (guardian) and asked if she was able to schedule the appointment for patient. She states she lives in [place] and patient is in group home so she is not able to schedule this for patient. I will send patient a letter requesting he or caregiver call back to schedule.

## 2019-08-02 ENCOUNTER — Ambulatory Visit
Admission: RE | Admit: 2019-08-02 | Discharge: 2019-08-02 | Disposition: A | Discharge status: Home / Self Care | Payer: Medicare Other | Source: Ambulatory Visit | Attending: Adult Health | Admitting: Adult Health

## 2019-08-02 ENCOUNTER — Other Ambulatory Visit: Payer: Self-pay

## 2019-08-02 DIAGNOSIS — G40909 Epilepsy, unspecified, not intractable, without status epilepticus: Secondary | ICD-10-CM | POA: Diagnosis not present

## 2019-08-02 MED ORDER — GADOBENATE DIMEGLUMINE 529 MG/ML IV SOLN
17.0000 mL | Freq: Once | INTRAVENOUS | Status: AC | PRN
  Administered 2019-08-02: 17 mL via INTRAVENOUS

## 2019-08-04 ENCOUNTER — Telehealth: Payer: Self-pay | Admitting: *Deleted

## 2019-08-04 NOTE — Telephone Encounter (Signed)
Spoke with guardian [name] and informed her that his recent MRI did not show any acute findings. She verbalized understanding, appreciation.

## 2019-09-23 ENCOUNTER — Other Ambulatory Visit: Payer: Self-pay

## 2019-09-26 ENCOUNTER — Other Ambulatory Visit: Payer: Self-pay

## 2019-09-26 ENCOUNTER — Encounter: Payer: Self-pay | Admitting: Oncology

## 2019-09-26 ENCOUNTER — Inpatient Hospital Stay (HOSPITAL_BASED_OUTPATIENT_CLINIC_OR_DEPARTMENT_OTHER): Payer: Medicare Other | Admitting: Oncology

## 2019-09-26 ENCOUNTER — Inpatient Hospital Stay: Payer: Medicare Other | Attending: Oncology

## 2019-09-26 VITALS — BP 108/78 | HR 65 | Temp 98.6°F | Resp 16 | Wt 197.7 lb

## 2019-09-26 DIAGNOSIS — Z79899 Other long term (current) drug therapy: Secondary | ICD-10-CM | POA: Insufficient documentation

## 2019-09-26 DIAGNOSIS — Z8673 Personal history of transient ischemic attack (TIA), and cerebral infarction without residual deficits: Secondary | ICD-10-CM | POA: Diagnosis not present

## 2019-09-26 DIAGNOSIS — F329 Major depressive disorder, single episode, unspecified: Secondary | ICD-10-CM | POA: Diagnosis not present

## 2019-09-26 DIAGNOSIS — J449 Chronic obstructive pulmonary disease, unspecified: Secondary | ICD-10-CM | POA: Insufficient documentation

## 2019-09-26 DIAGNOSIS — G40909 Epilepsy, unspecified, not intractable, without status epilepticus: Secondary | ICD-10-CM | POA: Diagnosis not present

## 2019-09-26 DIAGNOSIS — Q909 Down syndrome, unspecified: Secondary | ICD-10-CM | POA: Insufficient documentation

## 2019-09-26 DIAGNOSIS — F17210 Nicotine dependence, cigarettes, uncomplicated: Secondary | ICD-10-CM | POA: Insufficient documentation

## 2019-09-26 DIAGNOSIS — Z7982 Long term (current) use of aspirin: Secondary | ICD-10-CM | POA: Diagnosis not present

## 2019-09-26 DIAGNOSIS — D696 Thrombocytopenia, unspecified: Secondary | ICD-10-CM | POA: Insufficient documentation

## 2019-09-26 DIAGNOSIS — E039 Hypothyroidism, unspecified: Secondary | ICD-10-CM | POA: Diagnosis not present

## 2019-09-26 DIAGNOSIS — D649 Anemia, unspecified: Secondary | ICD-10-CM

## 2019-09-26 LAB — COMPREHENSIVE METABOLIC PANEL
ALT: 28 U/L (ref 0–44)
AST: 24 U/L (ref 15–41)
Albumin: 3.5 g/dL (ref 3.5–5.0)
Alkaline Phosphatase: 44 U/L (ref 38–126)
Anion gap: 8 (ref 5–15)
BUN: 17 mg/dL (ref 6–20)
CO2: 25 mmol/L (ref 22–32)
Calcium: 9.2 mg/dL (ref 8.9–10.3)
Chloride: 104 mmol/L (ref 98–111)
Creatinine, Ser: 1.14 mg/dL (ref 0.61–1.24)
GFR calc Af Amer: >60 mL/min (ref >60)
GFR calc non Af Amer: >60 mL/min (ref >60)
Glucose, Bld: 135 mg/dL — ABNORMAL HIGH (ref 70–99)
Potassium: 3.7 mmol/L (ref 3.5–5.1)
Sodium: 137 mmol/L (ref 135–145)
Total Bilirubin: 0.5 mg/dL (ref 0.3–1.2)
Total Protein: 6.9 g/dL (ref 6.5–8.1)

## 2019-09-26 LAB — CBC WITH DIFFERENTIAL/PLATELET
Abs Immature Granulocytes: 0.01 10*3/uL (ref 0.00–0.07)
Basophils Absolute: 0 10*3/uL (ref 0.0–0.1)
Basophils Relative: 1 %
Eosinophils Absolute: 0.3 10*3/uL (ref 0.0–0.5)
Eosinophils Relative: 7 %
HCT: 40.6 % (ref 39.0–52.0)
Hemoglobin: 12.6 g/dL — ABNORMAL LOW (ref 13.0–17.0)
Immature Granulocytes: 0 %
Lymphocytes Relative: 45 %
Lymphs Abs: 1.7 10*3/uL (ref 0.7–4.0)
MCH: 28.4 pg (ref 26.0–34.0)
MCHC: 31 g/dL (ref 30.0–36.0)
MCV: 91.4 fL (ref 80.0–100.0)
Monocytes Absolute: 0.4 10*3/uL (ref 0.1–1.0)
Monocytes Relative: 9 %
Neutro Abs: 1.4 10*3/uL — ABNORMAL LOW (ref 1.7–7.7)
Neutrophils Relative %: 38 %
Platelets: 88 10*3/uL — ABNORMAL LOW (ref 150–400)
RBC: 4.44 MIL/uL (ref 4.22–5.81)
RDW: 12 % (ref 11.5–15.5)
WBC: 3.7 10*3/uL — ABNORMAL LOW (ref 4.0–10.5)
nRBC: 0 % (ref 0.0–0.2)

## 2019-09-26 NOTE — Progress Notes (Signed)
[facility] follow up Visit:  Patient Care Team: [name], FNP as PCP - General (Family Medicine) Deboraha Sprang, MD as PCP - Cardiology (Cardiology)  REASON FOR VISIT Follow up for thrombocytopenia.   HISTORY OF PRESENTING ILLNESS: Mario Liu 59 y.o. male is here for evaluation of low platelet count. He has Down syndrome.  The patient lives in a group home and is accompanied by caregiver from group home. He is referred to [place] for evaluation of thrombocytopenia. He had labs on with primary care provider on December 15 2016 which showed WBC 5.3 hemoglobin 14.3 MCV 88 RDW 13.8 MPV 11.37 platelet 111 lymphocyte 46.69% absolute lymphocytes 2.5 normal is BMPnormal LFT. It was noted that hepatitis C antibody reactive. Patient was seen by [facility] for workupof her positive hepatitis C antibody Further tests including HCV RNA by PCR was negative.  Hep B core antibody negative, Hb surface antibody nonreactive,A antibody positive,Hep A IgM negative. So that patient did not really have hepatitis C, additional ultrasound and fibrocystic was canceled by GI.Marland Kitchen Patient reports feeling well, good energy level, good appetite, denies any weight loss fatigue, fever or chills. Patient is a poor historian. He told me that he used to drink liquor but haven't drink lately. In September 2018, patient had a stroke, acute ischemic left MCA stroke. His in getting physical therapy. Denies any easy bruising or bleeding. He appears to be less duct if compared to prior to the stroke. Able to have simple conversation with me.Denies any recent infection, bleeding, surgery. He has some dysphagia and speech intelligibility which slowly improving with the physical therapy and speech therapy according to the group home staff.  admitted from 07/05/2018 to 07/07/2018 due to after the mental status. He had acute cystitis urine culture grow strep  agalactiae.  Patient was treated with IV antibiotics  and switched to oral Augmentin. Acute kidney injury/dehydration resolved after hydration.    INTERVAL HISTORY Patient presents to clinic for all follow-up of thrombocytopenia.  Patient has a history of chronic cognitive impairment. Patient is not able to provide history. He denies any pain today.  No reports of bleeding. He was accompanied by staff from his group home.    Review of Systems  Unable to perform ROS: Other (Chronic cognitive impairment.)    MEDICAL HISTORY: Past Medical History:  Diagnosis Date  . Acute ischemic left MCA stroke ([place])   . Altered mental status 03/09/2018  . Baker's cyst of knee, left 2018  . Bilateral renal cysts   . COPD (chronic obstructive pulmonary disease) ([place])   . DDD (degenerative disc disease), lumbar   . Down's syndrome   . GERD (gastroesophageal reflux disease)   . Hepatitis C   . Hepatitis C antibody test positive 05/16/2017  . Hx of cholecystectomy   . Hypothyroidism   . Kidney cysts   . Lumbar spondylosis   . Major depressive disorder    Was treated at facility in [place] [facility] years ago  . PFO with atrial septal aneurysm   . PONV (postoperative nausea and vomiting)   . Recurrent falls 03/01/2018  . Right kidney mass   . Seizure disorder ([place]) 11/24/2017  . Thrombocytopenia ([place])   . Tobacco abuse   . Vitamin D deficiency     SURGICAL HISTORY: Past Surgical History:  Procedure Laterality Date  . IR RADIOLOGIST EVAL & MGMT  10/13/2018  . IR RADIOLOGIST EVAL & MGMT  03/03/2019  . LAPAROSCOPIC  CHOLECYSTECTOMY    . RADIOLOGY WITH ANESTHESIA Right 11/24/2018   Procedure: CT WITH ANESTHESIA RENAL CRYOABLATION WITH BIOPSY;  Surgeon: Markus Daft, MD;  Location: [facility];  Service: Anesthesiology;  Laterality: Right;    SOCIAL HISTORY: Social History   Socioeconomic History  . Marital status: Single    Spouse name: Not on file  . Number of children: Not on file  . Years of education: Not on file  . Highest education level:  Not on file  Occupational History  . Not on file  Tobacco Use  . Smoking status: Current Some Day Smoker    Packs/day: 0.50    Years: 39.00    Pack years: 19.50    Types: Cigarettes  . Smokeless tobacco: Never Used  . Tobacco comment: pt is still smoking  Substance and Sexual Activity  . Alcohol use: Not Currently  . Drug use: No  . Sexual activity: Not on file  Other Topics Concern  . Not on file  Social History Narrative  . Not on file   Social Determinants of Health   Financial Resource Strain:   . Difficulty of Paying Living Expenses: Not on file  Food Insecurity:   . Worried About Charity fundraiser in the Last Year: Not on file  . Ran Out of Food in the Last Year: Not on file  Transportation Needs:   . Lack of Transportation (Medical): Not on file  . Lack of Transportation (Non-Medical): Not on file  Physical Activity:   . Days of Exercise per Week: Not on file  . Minutes of Exercise per Session: Not on file  Stress:   . Feeling of Stress : Not on file  Social Connections:   . Frequency of Communication with Friends and Family: Not on file  . Frequency of Social Gatherings with Friends and Family: Not on file  . Attends Religious Services: Not on file  . Active Member of Clubs or Organizations: Not on file  . Attends Archivist Meetings: Not on file  . Marital Status: Not on file  Intimate Partner Violence:   . Fear of Current or Ex-Partner: Not on file  . Emotionally Abused: Not on file  . Physically Abused: Not on file  . Sexually Abused: Not on file    FAMILY HISTORY Family History  Adopted: Yes  Problem Relation Age of Onset  . Diabetes Maternal Grandmother   . Heart attack Maternal Aunt   . Prostate cancer Neg Hx   . Bladder Cancer Neg Hx   . Kidney cancer Neg Hx     ALLERGIES:  has No Known Allergies.  MEDICATIONS:  Current Outpatient Medications  Medication Sig Dispense Refill  . acetaminophen (TYLENOL) 325 MG tablet Take 650 mg  by mouth every 6 (six) hours as needed.    Marland Kitchen albuterol (PROVENTIL HFA;VENTOLIN HFA) 108 (90 Base) MCG/ACT inhaler Inhale 1-2 puffs into the lungs every 4 (four) hours as needed for wheezing or shortness of breath.     Marland Kitchen alendronate (FOSAMAX) 70 MG tablet Take 70 mg by mouth once a week. Fridays    . benztropine (COGENTIN) 1 MG tablet Take 1 mg by mouth at bedtime.    Marland Kitchen buPROPion (WELLBUTRIN XL) 150 MG 24 hr tablet Take 150 mg by mouth daily.     . Calcium Carb-Cholecalciferol (CALCIUM 600-D PO) Take by mouth 2 (two) times daily.    . cholecalciferol (VITAMIN D3) 25 MCG (1000 UT) tablet Take 1,000 Units by mouth daily.    Marland Kitchen  citalopram (CELEXA) 40 MG tablet Take 40 mg by mouth daily.    . clonazePAM (KLONOPIN) 0.5 MG tablet Take 0.5 mg by mouth daily as needed for anxiety (at bedtime).    . clopidogrel (PLAVIX) 75 MG tablet Take 75 mg daily by mouth.    . diphenhydrAMINE HCl (BENADRYL ALLERGY PO) Take by mouth as needed.    . donepezil (ARICEPT) 5 MG tablet Take 5 mg by mouth at bedtime.    . fluticasone (FLONASE) 50 MCG/ACT nasal spray Place 1 spray into both nostrils daily.    . food thickener (THICK IT) POWD Take by mouth as needed.    . Glycopyrrolate-Formoterol (BEVESPI AEROSPHERE) 9-4.8 MCG/ACT AERO Inhale 2 puffs into the lungs 2 (two) times daily.    . Loperamide HCl (IMODIUM PO) Take by mouth as needed.    . loratadine (CLARITIN) 10 MG tablet Take 10 mg by mouth daily.    . magnesium hydroxide (MILK OF MAGNESIA) 400 MG/5ML suspension Take by mouth daily as needed for mild constipation.    . Nutritional Supplements (BOOST GLUCOSE CONTROL) LIQD Take by mouth 2 (two) times daily.    . risperidone (RISPERDAL) 4 MG tablet Take 4 mg by mouth at bedtime.    . traZODone (DESYREL) 50 MG tablet Take 1 tablet (50 mg total) by mouth at bedtime.    Marland Kitchen aspirin EC 81 MG tablet Take 81 mg by mouth daily.    Marland Kitchen atorvastatin (LIPITOR) 40 MG tablet Take 40 mg by mouth at bedtime.      Current  Facility-Administered Medications  Medication Dose Route Frequency Provider Last Rate Last Admin  . lacosamide (VIMPAT) tablet 200 mg  200 mg Oral BID Garvin Fila, MD        PHYSICAL EXAMINATION: ECOG PERFORMANCE STATUS: 1 - Symptomatic but completely ambulatory  Vital sign see chart.  Physical Exam  Constitutional: He is oriented to person, place, and time. No distress.  HENT:  Head: Normocephalic and atraumatic.  Mouth/Throat: No oropharyngeal exudate.  Eyes: Pupils are equal, round, and reactive to light. EOM are normal. No scleral icterus.  Cardiovascular: Normal rate and regular rhythm.  No murmur heard. Pulmonary/Chest: Effort normal. No respiratory distress. He has no rales. He exhibits no tenderness.  Abdominal: Soft. He exhibits no distension. There is no abdominal tenderness.  Musculoskeletal:        General: No edema. Normal range of motion.     Cervical back: Normal range of motion and neck supple.  Neurological: He is alert and oriented to person, place, and time. No cranial nerve deficit. He exhibits normal muscle tone. Coordination normal.  Follow simple commands.  At baseline mental status.  Skin: Skin is warm and dry. He is not diaphoretic. No erythema.  Psychiatric: Affect normal.  Nursing note and vitals reviewed.  LABORATORY DATA: I have personally reviewed the data as listed: He had labs on with primary care provider on December 15 2016 which showed WBC 5.3 hemoglobin 14.3 MCV 88 RDW 13.8 MPV 11.37 platelet 111 lymphocyte 46.69% absolute lymphocytes 2.5 normal is BMPnormal LFT. It was noted that hepatitis C antibody reactive. Patient was seen by [facility] for workupof her positive hepatitis C antibody Further tests including HCV RNA by PCR was negative.  Hep B core antibody negative, Hb surface antibody nonreactive,A antibody positive,Hep A IgM negative. CBC    Component Value Date/Time   WBC 3.7 (L) 09/26/2019 1012   RBC 4.44 09/26/2019 1012   HGB  12.6 (  L) 09/26/2019 1012   HGB 13.6 04/07/2018 1320   HCT 40.6 09/26/2019 1012   HCT 42.6 04/07/2018 1320   PLT 88 (L) 09/26/2019 1012   PLT 121 (L) 04/07/2018 1320   MCV 91.4 09/26/2019 1012   MCV 86 04/07/2018 1320   MCH 28.4 09/26/2019 1012   MCHC 31.0 09/26/2019 1012   RDW 12.0 09/26/2019 1012   RDW 12.2 (L) 04/07/2018 1320   LYMPHSABS 1.7 09/26/2019 1012   LYMPHSABS 2.3 04/07/2018 1320   MONOABS 0.4 09/26/2019 1012   EOSABS 0.3 09/26/2019 1012   EOSABS 0.4 04/07/2018 1320   BASOSABS 0.0 09/26/2019 1012   BASOSABS 0.0 04/07/2018 1320   CMP Latest Ref Rng & Units 09/26/2019 06/27/2019 03/28/2019  Glucose 70 - 99 mg/dL 135(H) 105(H) 108(H)  BUN 6 - 20 mg/dL 17 16 19   Creatinine 0.61 - 1.24 mg/dL 1.14 0.96 1.13  Sodium 135 - 145 mmol/L 137 139 139  Potassium 3.5 - 5.1 mmol/L 3.7 4.5 3.9  Chloride 98 - 111 mmol/L 104 110 110  CO2 22 - 32 mmol/L 25 25 26   Calcium 8.9 - 10.3 mg/dL 9.2 9.2 9.0  Total Protein 6.5 - 8.1 g/dL 6.9 6.6 7.2  Total Bilirubin 0.3 - 1.2 mg/dL 0.5 0.7 0.5  Alkaline Phos 38 - 126 U/L 44 48 50  AST 15 - 41 U/L 24 26 24   ALT 0 - 44 U/L 28 24 22     04/21/2019.  Peripheral lab flow cytometry No monoclonal B cell population is detected. kappa:lambda ratio 1.8  There is no loss of, or aberrant expression of, the pan T cell  antigens to  suggest a neoplastic T cell process.  CD4:CD8 ratio 2.5  No circulating blasts are detected. There is no immunophenotypic  evidence  of abnormal myeloid maturation.  Analysis of the leukocyte population shows: granulocytes 50%,  monocytes  10%, lymphocytes 40%, blasts <0.5%, B cells 8%, T cells 30%, NK cells  2%.    RADIOGRAPHIC STUDIES: I have personally reviewed the radiological images as listed and agree with the findings in the report MR BRAIN W WO CONTRAST  Result Date: 08/03/2019 [facility] NEUROIMAG[facility] REPORT STUDY DATE: 08/02/19 PATIENT NAME: Marcia Rodas DOB: Apr 14, 1961 MRN: [id number]  ORDERING CLINICIAN: Frann Rider, NP CLINICAL HISTORY: 59 year old male with confusion. EXAM: MRI brain (with and without) TECHNIQUE: MRI of the brain with and without contrast was obtained utilizing 5 mm axial slices with T1, T2, T2 flair, SWI and diffusion weighted views.  T1 sagittal, T2 coronal and postcontrast views in the axial and coronal plane were obtained. CONTRAST: 3ml multihance COMPARISON: 06/27/19 CT IMAGING SITE: [facility] [address] (1.5 Tesla MRI)  FINDINGS: No abnormal lesions are seen on diffusion-weighted views to suggest acute ischemia. The cortical sulci, fissures and cisterns are normal in size and appearance. Lateral, third and fourth ventricle are normal in size and appearance. No extra-axial fluid collections are seen. No evidence of mass effect or midline shift.  Chronic left parietal and left temporal ischemic infarction with encephalomalacia and gliosis. Mild periventricular and subcortical chronic small vessel ischemic disease. No abnormal lesions are seen on post contrast views.  On sagittal views the posterior fossa, pituitary gland and corpus callosum are unremarkable. No evidence of intracranial hemorrhage on SWI views. The orbits and their contents, paranasal sinuses and calvarium are mucous thickening in the sphenoid, ethmoid and maxillary sinuses.  Intracranial flow voids are present.   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 - No acute findings. INTERPRETING PHYSICIAN: Penni Bombard, MD Certified in Neurology, Neurophysiology and Neuroimaging [facility] [address] [place], [place] [address] [phone]      ASSESSMENT/PLAN 1. Thrombocytopenia ([place])   2. Normocytic anemia    Labs are reviewed and discussed with patient. Thrombocytopenia has been stable. Platelet count is at 88,000, close to his  baseline. Chronic anemia, hemoglobin 12.6, stable. Patient has had extensive work-up. Likely ITP, cannot rule out underlying bone marrow disorders. Given that his counts has been stable over the past 2 years, multiple comorbidities, recommend continue observation every 6 months. Follow-up in clinic in 6 months.  All questions were answered. The patient knows to call the clinic with any problems, questions or concerns.    Earlie Server, MD, PhD Hematology Oncology [facility] Pager- [phone] 09/26/2019

## 2019-09-26 NOTE — Progress Notes (Signed)
Patient here with cardgiver from [facility], does not offer any problems today.

## 2019-09-27 ENCOUNTER — Other Ambulatory Visit: Payer: Self-pay | Admitting: Diagnostic Radiology

## 2019-09-27 DIAGNOSIS — N2889 Other specified disorders of kidney and ureter: Secondary | ICD-10-CM

## 2019-10-10 ENCOUNTER — Other Ambulatory Visit: Payer: Self-pay

## 2019-10-10 ENCOUNTER — Encounter: Payer: Self-pay | Admitting: Adult Health

## 2019-10-10 ENCOUNTER — Ambulatory Visit (INDEPENDENT_AMBULATORY_CARE_PROVIDER_SITE_OTHER): Payer: Medicare Other | Admitting: Adult Health

## 2019-10-10 VITALS — BP 122/72 | HR 92 | Temp 97.6°F | Ht 65.0 in | Wt 204.4 lb

## 2019-10-10 DIAGNOSIS — I10 Essential (primary) hypertension: Secondary | ICD-10-CM | POA: Diagnosis not present

## 2019-10-10 DIAGNOSIS — Z8673 Personal history of transient ischemic attack (TIA), and cerebral infarction without residual deficits: Secondary | ICD-10-CM | POA: Diagnosis not present

## 2019-10-10 DIAGNOSIS — I639 Cerebral infarction, unspecified: Secondary | ICD-10-CM | POA: Diagnosis not present

## 2019-10-10 DIAGNOSIS — E785 Hyperlipidemia, unspecified: Secondary | ICD-10-CM | POA: Diagnosis not present

## 2019-10-10 DIAGNOSIS — F99 Mental disorder, not otherwise specified: Secondary | ICD-10-CM

## 2019-10-10 DIAGNOSIS — R4189 Other symptoms and signs involving cognitive functions and awareness: Secondary | ICD-10-CM

## 2019-10-10 DIAGNOSIS — R569 Unspecified convulsions: Secondary | ICD-10-CM

## 2019-10-10 NOTE — Progress Notes (Signed)
[facility] [address] [place]. [place] [address] [phone]       FOLLOW-UP NOTE  [name] Date of Birth:  1960-12-26 Medical Record Number:  [id number]   Virtual Visit via Video Note  I connected with Mario Liu on 10/10/19 at 11:15 AM EST by a video enabled telemedicine application located at [facility] and verified that I am speaking with the correct person using two identifiers who was located at their own home accompanied by group home staff, Mario Liu.     Visit scheduled by Mario Hum, RN who discussed the limitations of evaluation and management by telemedicine and the availability of in person appointments. The patient expressed understanding and agreed to proceed.   HPI:  Stroke admission 04/2017: Mario Liu is a 59yo male with PMH significant for depression, Down's syndrome (currently living in a group home), COPD, and tobacco use who presents with aphasia since this morning at around 7am. History obtained from group home caregivers. Patient was in his usual state of health last night before he went to bed around 10pm. He was woken this morning and got up to get dressed. He came down to the kitchen and his caregiver noted that he was holding his underwear and a shirt in his hands, was not talking, and kept looking to his left side. No gait abnormalities. Caregivers state that he has not complained of any chest pain, palpitations, trouble breathing, fevers, or other symptoms recently. Patient has never had a stroke before. They called EMS and he was brought to [facility]. Head CT at [facility] demonstrated hypodensity in L MCA territory, consistent with infarct. He was transferred to [facility] for further evaluation and potential intervention. Patient was not administered IV t-PA secondary to unclear time of onset. CT angiogram and perfusion were obtained which showed a small mismatch but given patient's poor baseline functioning he was not considered a  candidate for endovascular treatment. MRI scan of the brain showed a large left MCA infarct with small petechial hemorrhagic transformation. 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 Lower extremity venous Dopplers were negative for DVT. Transthoracic echo showed normal ejection fraction. Bubble study was positive for small PFO/atrial septal defect. LDL cholesterol was elevated to 80 mg percent and hemoglobin A1c was 5.9. It was decided not to consider further workup with a TEE and loop recorder given his poor functional baseline he is not a candidate for PFO closure on anticoagulation. Patient was started on Lipitor and aspirin.   Initial visit 08/17/2017 PS: he has done well. His speech is still impaired and slurred but the can be understood with some difficulty. He is able to return almost back to his previous baseline. He can swallow well but at times chokes if he eats too quickly. His blood pressure seems to be well controlled and today it is [id number]. Is tolerating Lipitor without muscle aches and pains. He has no complaints today.  11/24/2017 visit PS :Patient is seen today for follow-up after his last visit 3 months ago. He is accompanied by his caregiver from the group home. Patient is unable to provide history. I have reviewed notes which accompany him and also I  called and spoke to a nurse at the group home where he stays. Patient apparently was visiting family during Christmas time and had a bunch of seizures. Detailed description is not available. Patient was initially started on Keppra but for some reason was switched to Vimpat. He is currently on 200 mg twice daily and  apparently tolerating it well and has had no witnessed seizures in the group home. He is otherwise in good health with no neurological changes. His tolerating aspirin without any bruising or bleeding. His blood pressure seems well controlled. He has not had any recurrent stroke or TIA symptoms. Patient has not had any brain imaging  or EEG study done for seizures.  Update 05/27/2018: Patient is being seen today for scheduled follow-up visit and is accompanied by group home caregiver.  Since previous appointment, he was seen at [facility] on 03/08/2018 with concerns of altered mental status.  He was seen previously on 03/01/2018 with similar complaints and was diagnosed with a UTI at that time.  UTI negative on 03/08/2018 admission.  MRI head reviewed and was negative for acute abnormality.  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  Patient was discharged back to group home in stable condition.  Patient states overall he has been doing well.  He has not had additional altered mental status episodes since most recent hospitalization.  On the hospitalization of 03/08/2018, Vimpat was discontinued for unclear reasons -possibly due to potential side effects of increased fatigue and altered mental status.  Since thisdiscontinuation, patient has not had any seizure like activity.  He has been more active in his group home.  Continues to take Plavix and Lipitor without side effects for secondary stroke prevention.  He was seen by psychiatry who restarted him on his psychiatric medications.  Denies new or worsening stroke/TIA symptoms or seizure-like activity.  Update via virtual visit 07/11/2019: Mario Liu is a 59 year old male who is being seen today for follow-up.  He is accompanied by his group home caregiver, Mario Liu.  Since prior visit, he has been transition to a different group home in 09/2018.  Since prior visit, he has had multiple ED admissions for generalized weakness and AMS with all work-up unremarkable.  Most recent ED admission on 06/27/2019 due to concerns of generalized weakness and urinating on himself with work-up unremarkable.  Per ED  note, differential diagnosis possible unwitnessed seizure with incontinence and postictal lethargy but no medication changes.  Recommended to follow-up with neurology outpatient.  Prior history of seizures on Vimpat but was discontinued after AMS hospitalization in 02/2018 due to possible side effects.  Per discussion with caregiver, she states he was "out of it" with increased confusion, decreased cognition and lethargy for 2 to 3 days and then resolved spontaneously.  Cognition/memory has been stable per caregiver with occasional incontinence but caregiver believes this is typically due to waiting too long to go to the bathroom.  He will occasionally need help with daily functioning such as getting dressed but for the most part independent.  He continues to ambulate without any recent falls.  She denies any worsening of his cognition or memory.  Denies any medication changes.  Currently on bupropion 150 mg daily, Celexa 40 mg daily, Klonopin 0.5 mg daily as needed for anxiety, Aricept 5 mg nightly, trazodone 50 mg nightly and risperidone 4 mg nightly.  She denies any witnessed seizure activity or seizure type symptoms.  Continues on Plavix and Lipitor for secondary stroke prevention without side effects.  Continues to follow with psychiatry regularly.  Update 10/10/2019: Mr. Stiteler is a 59 year old male who is being seen today for 80-month follow-up regarding history of stroke, seizures and  transient altered mental status accompanied by group home transporter/caregiver.  Residual stroke deficits of dysarthria and reported dysphagia has been stable.  Due to concerns with episodes of altered mental status EEG and MRI w/wo ordered.  MRI w/wo on 08/02/2019 showed chronic left parietal and left temporal ischemic infarction but no acute abnormalities.  EEG has not been completed at this time due to difficulty contacting group home.  Caregiver does report having an episode last week of having difficulty with ambulation  and standing and had to use wheelchair to bring patient in to see his PCP.  Per caregiver, questionable UTI and was started on antibiotics and increase hydration.  Cognitive impairment has been stable without worsening.  He continues to follow with Dr. Rosine Liu psychiatry with ongoing use of multiple psychiatric medications.  Continues on Plavix and atorvastatin for secondary stroke prevention without side effects.  Blood pressure today 122/72.  No further concerns at this time.      ROS:   14 system review of systems is positive for see HPI and all other systems negative   PMH:  Past Medical History:  Diagnosis Date  . Acute ischemic left MCA stroke ([place])   . Altered mental status 03/09/2018  . Baker's cyst of knee, left 2018  . Bilateral renal cysts   . COPD (chronic obstructive pulmonary disease) ([place])   . DDD (degenerative disc disease), lumbar   . Down's syndrome   . GERD (gastroesophageal reflux disease)   . Hepatitis C   . Hepatitis C antibody test positive 05/16/2017  . Hx of cholecystectomy   . Hypothyroidism   . Kidney cysts   . Lumbar spondylosis   . Major depressive disorder    Was treated at facility in [place] ([facility]) years ago  . PFO with atrial septal aneurysm   . PONV (postoperative nausea and vomiting)   . Recurrent falls 03/01/2018  . Right kidney mass   . Seizure disorder ([place]) 11/24/2017  . Thrombocytopenia ([place])   . Tobacco abuse   . Vitamin D deficiency     Social History:  Social History   Socioeconomic History  . Marital status: Single    Spouse name: Not on file  . Number of children: Not on file  . Years of education: Not on file  . Highest education level: Not on file  Occupational History  . Not on file  Tobacco Use  . Smoking status: Current Some Day Smoker    Packs/day: 0.50    Years: 39.00    Pack years: 19.50    Types: Cigarettes  . Smokeless tobacco: Never Used  . Tobacco comment: pt is still smoking  Substance and Sexual  Activity  . Alcohol use: Not Currently  . Drug use: No  . Sexual activity: Not on file  Other Topics Concern  . Not on file  Social History Narrative  . Not on file   Social Determinants of Health   Financial Resource Strain:   . Difficulty of Paying Living Expenses: Not on file  Food Insecurity:   . Worried About Charity fundraiser in the Last Year: Not on file  . Ran Out of Food in the Last Year: Not on file  Transportation Needs:   . Lack of Transportation (Medical): Not on file  . Lack of Transportation (Non-Medical): Not on file  Physical Activity:   . Days of Exercise per Week: Not on file  . Minutes of Exercise per Session: Not on file  Stress:   .  Feeling of Stress : Not on file  Social Connections:   . Frequency of Communication with Friends and Family: Not on file  . Frequency of Social Gatherings with Friends and Family: Not on file  . Attends Religious Services: Not on file  . Active Member of Clubs or Organizations: Not on file  . Attends Archivist Meetings: Not on file  . Marital Status: Not on file  Intimate Partner Violence:   . Fear of Current or Ex-Partner: Not on file  . Emotionally Abused: Not on file  . Physically Abused: Not on file  . Sexually Abused: Not on file    Medications:   Current Outpatient Medications on File Prior to Visit  Medication Sig Dispense Refill  . acetaminophen (TYLENOL) 325 MG tablet Take 650 mg by mouth every 6 (six) hours as needed.    Marland Kitchen albuterol (PROVENTIL HFA;VENTOLIN HFA) 108 (90 Base) MCG/ACT inhaler Inhale 1-2 puffs into the lungs every 4 (four) hours as needed for wheezing or shortness of breath.     Marland Kitchen alendronate (FOSAMAX) 70 MG tablet Take 70 mg by mouth once a week. Fridays    . aspirin EC 81 MG tablet Take 81 mg by mouth daily.    Marland Kitchen atorvastatin (LIPITOR) 40 MG tablet Take 40 mg by mouth at bedtime.     . benztropine (COGENTIN) 1 MG tablet Take 1 mg by mouth at bedtime.    Marland Kitchen buPROPion (WELLBUTRIN XL)  150 MG 24 hr tablet Take 150 mg by mouth daily.     . Calcium Carb-Cholecalciferol (CALCIUM 600-D PO) Take by mouth 2 (two) times daily.    . cholecalciferol (VITAMIN D3) 25 MCG (1000 UT) tablet Take 1,000 Units by mouth daily.    . citalopram (CELEXA) 40 MG tablet Take 40 mg by mouth daily.    . clonazePAM (KLONOPIN) 0.5 MG tablet Take 0.5 mg by mouth daily as needed for anxiety (at bedtime).    . clopidogrel (PLAVIX) 75 MG tablet Take 75 mg daily by mouth.    . diphenhydrAMINE HCl (BENADRYL ALLERGY PO) Take by mouth as needed.    . donepezil (ARICEPT) 5 MG tablet Take 5 mg by mouth at bedtime.    . fluticasone (FLONASE) 50 MCG/ACT nasal spray Place 1 spray into both nostrils daily.    . food thickener (THICK IT) POWD Take by mouth as needed.    . Glycopyrrolate-Formoterol (BEVESPI AEROSPHERE) 9-4.8 MCG/ACT AERO Inhale 2 puffs into the lungs 2 (two) times daily.    . Loperamide HCl (IMODIUM PO) Take by mouth as needed.    . loratadine (CLARITIN) 10 MG tablet Take 10 mg by mouth daily.    . magnesium hydroxide (MILK OF MAGNESIA) 400 MG/5ML suspension Take by mouth daily as needed for mild constipation.    . Nutritional Supplements (BOOST GLUCOSE CONTROL) LIQD Take by mouth 2 (two) times daily.    . risperidone (RISPERDAL) 4 MG tablet Take 4 mg by mouth at bedtime.    . traZODone (DESYREL) 50 MG tablet Take 1 tablet (50 mg total) by mouth at bedtime.     Current Facility-Administered Medications on File Prior to Visit  Medication Dose Route Frequency Provider Last Rate Last Admin  . lacosamide (VIMPAT) tablet 200 mg  200 mg Oral BID Mario Fila, MD        Allergies:  No Known Allergies  Physical Exam Today's Vitals   10/10/19 1107  BP: 122/72  Pulse: 92  Temp: 97.6 F (36.4  C)  TempSrc: Oral  Weight: 204 lb 6.4 oz (92.7 kg)  Height: 5\' 5"  (1.651 m)   Body mass index is 34.01 kg/m.  General: well developed, well nourished,  pleasant middle-aged African-American male, seated,  in no evident distress Head: head normocephalic and atraumatic.   Neck: supple with no carotid or supraclavicular bruits Cardiovascular: regular rate and rhythm, no murmurs Musculoskeletal: no deformity Skin:  no rash/petichiae Vascular:  Normal pulses all extremities   Neurologic Exam Mental Status: Awake and fully alert.   Moderate dysarthria.  DIsoriented to place and time but oriented to self. Attention span, concentration and fund of knowledge poor.  Is able to follow simple instructions without great difficulty.  Mood and affect appropriate.  Cranial Nerves: Pupils equal, briskly reactive to light. Extraocular movements full without nystagmus. Visual fields full to confrontation. Hearing intact. Facial sensation intact. Face, tongue, palate moves normally and symmetrically.  Motor: Normal bulk and tone. Normal strength in all tested extremity muscles. Sensory.: intact to touch , pinprick , position and vibratory sensation.  Coordination: Rapid alternating movements normal in all extremities. Finger-to-nose and heel-to-shin performed accurately bilaterally. Gait and Station: Arises from chair without difficulty. Stance is normal. Gait demonstrates normal stride length and balance Reflexes: 1+ and symmetric. Toes downgoing.       IMAGING/LABS:  MR BRAIN W WO CONTRAST 08/02/2019 IMPRESSION:  MRI brain (with and without) demonstrating: - Chronic left parietal and left temporal ischemic infarction with encephalomalacia and gliosis. - No acute findings.  CT HEAD WO CONTRAST 11/30/2018 IMPRESSION: 1. Stable prior infarct involving portions of the left temporal and parietal lobes. Slight periventricular small vessel disease. No evident acute infarct. No mass or hemorrhage evident. 2.  Inferior right frontal scalp hematoma.  No evident fracture. 3.  Multiple foci of paranasal sinus disease. 4.  Stable slight arterial vascular calcification.     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  He has had multiple ED evaluations and hospitalizations due to transient altered mental status and transient weakness secondary to unknown etiology.  Occasionally will be diagnosed with UTI and placed on antibiotics.  Of note, was diagnosed with UTI last week by PCP complex antibiotics after episode of increased weakness and ambulation difficulty.  Repeat MRI unremarkable.  AED discontinued during 02/2018 admission for unclear reasons but has not had any witnessed seizure activity.    PLAN: - EEG ordered at prior visit but has not been scheduled at this time.  Advised to schedule after today's visit and possibly consider placing patient back on AED if indicated -Discussion with caregiver regarding etiology with continued fluctuation of mental status and weakness episodes.  Could potentially be due to cognitive impairment, functional decline, seizures, recurrent UTIs as well as psych related such as behavioral -Advised to continue to follow with psychiatry for ongoing management -Continue clopidogrel 75 mg daily  and Lipitor for secondary stroke prevention -F/u with PCP regarding your HLD and HTN management -Advised to continue to stay active and maintain a healthy diet -continue to monitor BP at home -Maintain strict control of hypertension with blood pressure goal below 130/90, diabetes with hemoglobin A1c goal below 6.5% and cholesterol with LDL cholesterol (bad cholesterol) goal below 70 mg/dL. I also advised the patient to eat a healthy diet with plenty of whole grains, cereals, fruits and vegetables, exercise regularly and maintain ideal body weight.  Follow-up in 3 months for in-office visit or call earlier if needed   Greater than 50% of time during this 30 minute visit was spent on  counseling,explanation of diagnosis of stroke, seizures, cognitive impairment, planning of further management, discussion with patient and family and coordination of care  Mario Liu, [facility]  [facility] [address] [place], [place] [address]  Phone [phone] Fax [phone] Note: This document was prepared with digital dictation and possible smart phrase technology. Any transcriptional errors that result from this process are unintentional.

## 2019-10-10 NOTE — Patient Instructions (Signed)
Continue clopidogrel 75 mg daily  and lipitor  for secondary stroke prevention  Continue to follow up with PCP regarding cholesterol and blood pressure management   Please schedule EEG when you leave today to look for possible seizure activity (ordered at prior visit)  Continue to monitor blood pressure at home  Maintain strict control of hypertension with blood pressure goal below 130/90, diabetes with hemoglobin A1c goal below 6.5% and cholesterol with LDL cholesterol (bad cholesterol) goal below 70 mg/dL. I also advised the patient to eat a healthy diet with plenty of whole grains, cereals, fruits and vegetables, exercise regularly and maintain ideal body weight.  Followup in the future with me in 3 months or call earlier if needed       Thank you for coming to see [place] at [facility]. I hope we have been able to provide you high quality care today.  You may receive a patient satisfaction survey over the next few weeks. We would appreciate your feedback and comments so that we may continue to improve ourselves and the health of our patients.

## 2019-10-11 NOTE — Progress Notes (Signed)
I agree with the above plan 

## 2019-10-24 ENCOUNTER — Other Ambulatory Visit: Payer: Medicare Other

## 2019-10-24 ENCOUNTER — Other Ambulatory Visit: Payer: Self-pay

## 2019-10-24 ENCOUNTER — Encounter: Payer: Self-pay | Admitting: Adult Health

## 2019-10-26 ENCOUNTER — Ambulatory Visit: Payer: Medicare Other

## 2019-11-01 ENCOUNTER — Telehealth: Payer: Medicare Other

## 2019-11-07 ENCOUNTER — Other Ambulatory Visit: Payer: Self-pay

## 2019-11-07 ENCOUNTER — Other Ambulatory Visit (INDEPENDENT_AMBULATORY_CARE_PROVIDER_SITE_OTHER): Payer: Medicare Other | Admitting: Neurology

## 2019-11-07 DIAGNOSIS — G40909 Epilepsy, unspecified, not intractable, without status epilepticus: Secondary | ICD-10-CM

## 2019-11-07 DIAGNOSIS — R4182 Altered mental status, unspecified: Secondary | ICD-10-CM

## 2019-11-08 ENCOUNTER — Ambulatory Visit
Admission: RE | Admit: 2019-11-08 | Discharge: 2019-11-08 | Disposition: A | Discharge status: Home / Self Care | Payer: Medicare Other | Source: Ambulatory Visit | Attending: Diagnostic Radiology | Admitting: Diagnostic Radiology

## 2019-11-08 ENCOUNTER — Other Ambulatory Visit: Payer: Self-pay

## 2019-11-08 DIAGNOSIS — N2889 Other specified disorders of kidney and ureter: Secondary | ICD-10-CM | POA: Diagnosis present

## 2019-11-08 MED ORDER — GADOBUTROL 1 MMOL/ML IV SOLN
9.0000 mL | Freq: Once | INTRAVENOUS | Status: AC | PRN
  Administered 2019-11-08: 9 mL via INTRAVENOUS

## 2019-11-16 ENCOUNTER — Ambulatory Visit
Admission: RE | Admit: 2019-11-16 | Discharge: 2019-11-16 | Disposition: A | Discharge status: Home / Self Care | Payer: Medicare Other | Source: Ambulatory Visit | Attending: Diagnostic Radiology | Admitting: Diagnostic Radiology

## 2019-11-16 ENCOUNTER — Encounter: Payer: Self-pay | Admitting: *Deleted

## 2019-11-16 ENCOUNTER — Other Ambulatory Visit: Payer: Self-pay

## 2019-11-16 DIAGNOSIS — N2889 Other specified disorders of kidney and ureter: Secondary | ICD-10-CM

## 2019-11-16 HISTORY — PX: IR RADIOLOGIST EVAL & MGMT: IMG5224

## 2019-11-16 NOTE — Progress Notes (Signed)
Chief Complaint: Patient was consulted remotely today (TeleHealth) for follow-up renal cryoablation  Referring Physician(s): Hollice Espy  History of Present Illness: Mario Liu is a 59 y.o. male with history of right papillary renal cell carcinoma that was treated with CT-guided cryoablation on 11/24/2018.  Follow-up was performed with 2 separate phone calls.  The first phone call was made to the patient's facility caretaker, Rise Paganini.  The second phone call was made with the patient's sister who is his legal guardian.  According to the caretaker at his facility, the patient has no current issues.  She is not aware of any urinary problems or hematuria or dysuria.  The patient has Down syndrome and limited verbal capacity to discuss these matters over the telephone.  I reviewed the recent MRI results with the patient's sister on the telephone.  Fortunately, the recent MRI does not show any residual or recurrent neoplasm from the right renal cryoablation.  Patient continues to have renal cyst and persistent large fluid collection in the right kidney lower pole compatible with a chronically obstructed collecting system.  Past Medical History:  Diagnosis Date  . Acute ischemic left MCA stroke ([place])   . Altered mental status 03/09/2018  . Baker's cyst of knee, left 2018  . Bilateral renal cysts   . COPD (chronic obstructive pulmonary disease) ([place])   . DDD (degenerative disc disease), lumbar   . Down's syndrome   . GERD (gastroesophageal reflux disease)   . Hepatitis C   . Hepatitis C antibody test positive 05/16/2017  . Hx of cholecystectomy   . Hypothyroidism   . Kidney cysts   . Lumbar spondylosis   . Major depressive disorder    Was treated at facility [place] [place] years ago  . PFO with atrial septal aneurysm   . PONV (postoperative nausea and vomiting)   . Recurrent falls 03/01/2018  . Right kidney mass   . Seizure disorder ([place]) 11/24/2017  . Thrombocytopenia ([place])     . Tobacco abuse   . Vitamin D deficiency     Past Surgical History:  Procedure Laterality Date  . IR RADIOLOGIST EVAL & MGMT  10/13/2018  . IR RADIOLOGIST EVAL & MGMT  03/03/2019  . LAPAROSCOPIC CHOLECYSTECTOMY    . RADIOLOGY WITH ANESTHESIA Right 11/24/2018   Procedure: CT WITH ANESTHESIA RENAL CRYOABLATION WITH BIOPSY;  Surgeon: Markus Daft, MD;  Location: [facility];  Service: Anesthesiology;  Laterality: Right;    Allergies: Patient has no known allergies.  Medications: Prior to Admission medications   Medication Sig Start Date End Date Taking? Authorizing Provider  acetaminophen (TYLENOL) 325 MG tablet Take 650 mg by mouth every 6 (six) hours as needed.    [provider]  albuterol (PROVENTIL HFA;VENTOLIN HFA) 108 (90 Base) MCG/ACT inhaler Inhale 1-2 puffs into the lungs every 4 (four) hours as needed for wheezing or shortness of breath.     [provider]  alendronate (FOSAMAX) 70 MG tablet Take 70 mg by mouth once a week. Fridays    [provider]  atorvastatin (LIPITOR) 40 MG tablet Take 40 mg by mouth at bedtime.     [provider]  benztropine (COGENTIN) 1 MG tablet Take 1 mg by mouth at bedtime.    [provider]  buPROPion (WELLBUTRIN XL) 150 MG 24 hr tablet Take 150 mg by mouth daily.     [provider]  Calcium Carb-Cholecalciferol (CALCIUM 600-D PO) Take by mouth 2 (two) times daily.  [provider]  cholecalciferol (VITAMIN D3) 25 MCG (1000 UT) tablet Take 1,000 Units by mouth daily.    [provider]  citalopram (CELEXA) 40 MG tablet Take 40 mg by mouth daily.    [provider]  clonazePAM (KLONOPIN) 0.5 MG tablet Take 0.5 mg by mouth daily as needed for anxiety (at bedtime).    [provider]  clopidogrel (PLAVIX) 75 MG tablet Take 75 mg daily by mouth.    [provider]  diphenhydrAMINE HCl (BENADRYL ALLERGY PO) Take by mouth as needed.    [provider]   donepezil (ARICEPT) 5 MG tablet Take 5 mg by mouth at bedtime.    [provider]  fluticasone (FLONASE) 50 MCG/ACT nasal spray Place 1 spray into both nostrils daily.    [provider]  food thickener (THICK IT) POWD Take by mouth as needed.    [provider]  Glycopyrrolate-Formoterol (BEVESPI AEROSPHERE) 9-4.8 MCG/ACT AERO Inhale 2 puffs into the lungs 2 (two) times daily.    [provider]  Loperamide HCl (IMODIUM PO) Take by mouth as needed.    [provider]  loratadine (CLARITIN) 10 MG tablet Take 10 mg by mouth daily.    [provider]  magnesium hydroxide (MILK OF MAGNESIA) 400 MG/5ML suspension Take by mouth daily as needed for mild constipation.    [provider]  Nutritional Supplements (BOOST GLUCOSE CONTROL) LIQD Take by mouth 2 (two) times daily.    [provider]  risperidone (RISPERDAL) 4 MG tablet Take 4 mg by mouth at bedtime.    [provider]  traZODone (DESYREL) 50 MG tablet Take 1 tablet (50 mg total) by mouth at bedtime. 03/11/18   Loletha Grayer, MD     Family History  Adopted: Yes  Problem Relation Age of Onset  . Diabetes Maternal Grandmother   . Heart attack Maternal Aunt   . Prostate cancer Neg Hx   . Bladder Cancer Neg Hx   . Kidney cancer Neg Hx     Social History   Socioeconomic History  . Marital status: Single    Spouse name: Not on file  . Number of children: Not on file  . Years of education: Not on file  . Highest education level: Not on file  Occupational History  . Not on file  Tobacco Use  . Smoking status: Current Some Day Smoker    Packs/day: 0.50    Years: 39.00    Pack years: 19.50    Types: Cigarettes  . Smokeless tobacco: Never Used  . Tobacco comment: pt is still smoking  Substance and Sexual Activity  . Alcohol use: Not Currently  . Drug use: No  . Sexual activity: Not on file  Other Topics Concern  . Not on file  Social History  Narrative  . Not on file   Social Determinants of Health   Financial Resource Strain:   . Difficulty of Paying Living Expenses: Not on file  Food Insecurity:   . Worried About Charity fundraiser in the Last Year: Not on file  . Ran Out of Food in the Last Year: Not on file  Transportation Needs:   . Lack of Transportation (Medical): Not on file  . Lack of Transportation (Non-Medical): Not on file  Physical Activity:   . Days of Exercise per Week: Not on file  . Minutes of Exercise per Session: Not on file  Stress:   . Feeling of Stress :  Not on file  Social Connections:   . Frequency of Communication with Friends and Family: Not on file  . Frequency of Social Gatherings with Friends and Family: Not on file  . Attends Religious Services: Not on file  . Active Member of Clubs or Organizations: Not on file  . Attends Archivist Meetings: Not on file  . Marital Status: Not on file    \  Physical Exam No direct physical exam was performed  Vital Signs: There were no vitals taken for this visit.  Imaging: MR ABDOMEN WWO CONTRAST  Addendum Date: 11/16/2019   ADDENDUM REPORT: 11/16/2019 15:02 ADDENDUM: After reviewing ablation images from 11/24/2018 the Bosniak 2 F appears to represent interpolar and lower pole calices with dilation adjacent to a cortical cyst and a renal sinus cyst. The significance of these findings is uncertain and perhaps slightly worsened progressive over time. Follow-up excretory imaging and or hematuria study could potentially be for performed for further assessment this would give both parenchymal enhancement around the collecting system elements and excretory phase assessment. Findings were discussed with Dr. Anselm Pancoast radiology via telephone at time of dictation of this addendum. Electronically Signed   By: Zetta Bills M.D.   On: 11/16/2019 15:02   Result Date: 11/16/2019 CLINICAL DATA:  Right renal mass, area on right kidney with history of right  renal cryoablation for papillary renal cell carcinoma EXAM: MRI ABDOMEN WITHOUT AND WITH CONTRAST TECHNIQUE: Multiplanar multisequence MR imaging of the abdomen was performed both before and after the administration of intravenous contrast. CONTRAST:  28mL GADAVIST GADOBUTROL 1 MMOL/ML IV SOLN COMPARISON:  Multiple prior studies, most recent comparison evaluation from March 02, 2019 FINDINGS: Lower chest: Incidental imaging of the lung bases is unremarkable by MRI with limited assessment is. Hepatobiliary: No signs of suspicious focal hepatic lesion. No biliary ductal dilation. Pancreas: No mass, inflammatory changes, or other parenchymal abnormality identified. Spleen: Normal appearance of the spleen, no signs of focal, suspicious splenic lesion. Adrenals/Urinary Tract: Stable nodularity of the right adrenal gland. Left adrenal adenoma unchanged since 2019. Ablation zone in posterior upper pole of the medial portion of the right kidney above the hilar lip measuring 2.2 x 1.9 cm previously 2.5 x 2.4 cm. There is surrounding post ablation change with a rim of fibrosis. Within the ablated lesion there is intrinsic T1 hyperintensity with some late rim enhancement also likely representing fibrosis. Large renal sinus cyst displacing vessels and collecting system elements in the lower and interpolar right kidney shows node definite interval change in is within 1-2 mm of previous size at approximately 5.5 x 5.3 cm in the axial plane. This measures 8.4 cm Tate 0.5 cm in the craniocaudal plane and 8.3 cm on the prior exam with enlargement slowly over time. The focal area of "septal thickening" is not appreciated on today's study and there are likely vascular and collecting system elements which pass through and/or around this cystic area. When compared to previous imaging studies this has enlarged, particularly as compared to study of 04/19/2018 where there is calcification which can be seen extending through this lesion where  it measured approximately 5.5 cm in greatest craniocaudal extent and 4.9 x 4.7 cm in greatest axial dimension. Simple cysts seen elsewhere in the kidneys are unchanged as well. No signs of hydronephrosis. Stomach/Bowel: Bowel is normal to the extent visualized. Limited assessment on MRI. Vascular/Lymphatic: Visualized vascular structures are unremarkable. No signs of adenopathy in the retroperitoneum. Other:  None. Musculoskeletal: No acute bone finding  or destructive bone lesion to the extent visualized. IMPRESSION: 1. Post ablation zone in posterior upper pole of the right kidney with surrounding post ablation change. No evidence for residual or recurrent neoplasm. 2. Stable left adrenal adenoma. Also likely with adenomas associated with the right adrenal gland. Attention on follow-up. 3. Postcontrast images less motion degraded on today's study showing no signs of enhancement in the ablation zone. Also with decreased size. 4. Cysts in the bilateral kidneys with Bosniak 2 F lesion in the renal sinus and lower pole the right kidney. This is enlarged slowly over time and motion degradation on previous studies as made assessment difficult. Vascular and collecting system elements are splayed about the lesion and discrete septal thickening is not appreciated on today's study though coronal images on today's exam are limited by respiratory motion. Could consider CT follow-up renal protocol with additional excretory phase imaging to assess vascular and collecting system relationships and determine whether there is true septal thickening and enhancement. Electronically Signed: By: Zetta Bills M.D. On: 11/09/2019 08:46   EEG adult        [facility] [place]. [place] [address] [phone]      Electroencephalogram Procedure Note [name] Date of Birth:  Nov 12, 1960 Medical Record Number:  [id number] Indications: Diagnostic Date of Procedure : 11/07/2019 Medications: none Clinical  history : 59 year old male being evaluated for seizures Technical Description This study was performed using 17 channel digital electroencephalographic recording equipment. International 10-20 electrode placement was used. The record was obtained with the patient awake, drowsy and asleep.  The record is of good technical quality for purposes of interpretation. Activation Procedures:  hyperventilation and photic stimulation . EEG Description Awake: Alpha Activity: The waking state record contains a well-defined bi-occipital alpha rhythm of  moderate amplitude with a dominant frequency of 8 Hz. Reactivity is present. No paroxsymal activity, spikes, or sharp waves are noted. Technical component of study is adequate. EKG tracing shows regular sinus rhythm Length of this recording is 26 minutes and 25 seconds Sleep: With drowsiness, there is attenuation of the background alpha activity. As the patient enters into light sleep, vertex waves and symmetrical spindles are noted. K complexes are noted in sleep. Transition to the waking state is unremarkable. Result of Activation Procedures: Hyperventilation: Hyperventilation for three minutes fails to activate the recording. Photo Stimulation: Photic stimulation failed to activated the recording. Summary Normal electroencephalogram, awake, asleep and with activation procedures. There are no focal lateralizing or epileptiform features.    Labs:  CBC: Recent Labs    02/12/19 2254 03/28/19 1350 06/27/19 0958 09/26/19 1012  WBC 7.8 5.0 4.1 3.7*  HGB 12.4* 12.0* 12.5* 12.6*  HCT 39.4 37.2* 40.5 40.6  PLT 79* 92* 96* 88*    COAGS: Recent Labs    11/22/18 1030  INR 1.0    BMP: Recent Labs    02/12/19 2254 03/28/19 1350 06/27/19 1237 09/26/19 1012  NA 134* 139 139 137  K 4.0 3.9 4.5 3.7  CL 103 110 110 104  CO2 23 26 25 25   GLUCOSE 121* 108* 105* 135*  BUN 24* 19 16 17   CALCIUM 8.8* 9.0 9.2 9.2  CREATININE 1.65* 1.13 0.96 1.14  GFRNONAA 46* >60  >60 >60  GFRAA 53* >60 >60 >60    LIVER FUNCTION TESTS: Recent Labs    03/28/19 1350 06/27/19 1237 09/26/19 1012  BILITOT 0.5 0.7 0.5  AST 24 26 24   ALT 22 24 28   ALKPHOS 50 48  44  PROT 7.2 6.6 6.9  ALBUMIN 3.6 3.5 3.5    TUMOR MARKERS: No results for input(s): AFPTM, CEA, CA199, CHROMGRNA in the last 8760 hours.  Assessment and Plan:  59 year old male with a right renal cell carcinoma that was treated with cryoablation on 11/24/2018.  No evidence for recurrent or residual disease on the recent MRI from 11/08/2019.  Discussed surveillance with the patient's sister.  We will plan for follow-up MRI of the kidneys in 1 year for surveillance of the renal cell carcinoma.  Patient has bilateral renal cysts including a large complex fluid collection involving the mid and inferior right kidney.  This has been described as a 94F Bosniak lesion on the MRI images but this collection fills with contrast on my ablation study.  I discussed with Dr. 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  He is planning to make an addendum to the recent MRI.  However, this obstructed lower pole collecting system appears to be slowly enlarging in size.  This area measures 8.5 cm in the craniocaudal dimension on the recent examination and measured 5.5 cm in the craniocaudal dimension on a CT from 04/19/2018.  Will discuss with Dr. Erlene Quan of urology about further evaluation and management of this obstructed collecting system.    Electronically Signed: Burman Riis 11/16/2019, 3:37 PM   I spent a total of    10 Minutes in remote  clinical consultation, greater than 50% of which was counseling/coordinating care for renal cell carcinoma and follow-up cryoablation.    Visit type: Audio only (telephone). Audio (no video) only due to patient's mental capacity and social  situation.. Alternative for in-person consultation at [facility], [address] [place], [place], [place]. This visit type was conducted due to national recommendations for restrictions regarding the COVID-19 Pandemic (e.g. social distancing).  This format is felt to be most appropriate for this patient at this time.  All issues noted in this document were discussed and addressed.  Patient ID: Mario Liu, male   DOB: Jul 13, 1961, 59 y.o.   MRN: [id number]

## 2019-11-16 NOTE — Progress Notes (Signed)
Kindly inform the patient that EEG study was normal

## 2019-11-17 ENCOUNTER — Telehealth: Payer: Self-pay | Admitting: *Deleted

## 2019-11-17 ENCOUNTER — Telehealth: Payer: Self-pay

## 2019-11-17 NOTE — Telephone Encounter (Addendum)
Attempted to call to arrange a follow up with Dr. Erlene Quan. Unable to reach guardian-was told to call back on Monday.

## 2019-11-17 NOTE — Telephone Encounter (Signed)
I reached out to the pt's sister Lerry Liner ( ok per dpr) and advised of results. She verbalized understanding.    Garvin Fila, MD  11/16/2019 7:12 PM EST    Kindly inform the patient that EEG study was normal

## 2019-12-21 ENCOUNTER — Other Ambulatory Visit: Payer: Self-pay

## 2019-12-21 ENCOUNTER — Telehealth (INDEPENDENT_AMBULATORY_CARE_PROVIDER_SITE_OTHER): Payer: Medicare Other | Admitting: Urology

## 2019-12-21 NOTE — Progress Notes (Signed)
12/21/19  Health care proxy not available. Need to reschedule for a 3 way call. Pt lives in a group home and health care proxy lives in [place].   Jamas Lav, am acting as a scribe for Dr. Hollice Espy,  I have reviewed the above documentation for accuracy and completeness, and I agree with the above.   Hollice Espy, MD

## 2020-01-03 ENCOUNTER — Telehealth (INDEPENDENT_AMBULATORY_CARE_PROVIDER_SITE_OTHER): Payer: Medicare Other | Admitting: Urology

## 2020-01-03 ENCOUNTER — Other Ambulatory Visit: Payer: Self-pay

## 2020-01-03 DIAGNOSIS — I639 Cerebral infarction, unspecified: Secondary | ICD-10-CM

## 2020-01-03 DIAGNOSIS — N1339 Other hydronephrosis: Secondary | ICD-10-CM | POA: Diagnosis not present

## 2020-01-03 DIAGNOSIS — N281 Cyst of kidney, acquired: Secondary | ICD-10-CM | POA: Diagnosis not present

## 2020-01-03 DIAGNOSIS — R569 Unspecified convulsions: Secondary | ICD-10-CM

## 2020-01-03 DIAGNOSIS — C641 Malignant neoplasm of right kidney, except renal pelvis: Secondary | ICD-10-CM | POA: Diagnosis not present

## 2020-01-03 NOTE — Progress Notes (Signed)
Visit via Telephone Note   I connected with Mario Liu on 01/03/20 at [place] EDT by telephone and verified that I am speaking with the correct person using two identifiers. Mario Liu, Mario Liu and his care taker on 3 way speaker.  Location:  Patient: home  Provider: office    I discussed the limitations, risks, security and privacy concerns of performing an evaluation and management service by telephone and the availability of in person appointments. I also discussed with the patient that there may be a patient responsible charge related to this service. The patient expressed understanding and agreed to proceed.   History of Present Illness:  Mario Liu is a 59 y.o. M with Down syndrome who returns today via telephone call for the evaluation and management of right renal mass.   MRI Abd w/wo contrast on 09/28/2018 found the following. A 3.3 x 2.8 cm enhancing mass of the right kidney upper pole that was relatively occult on prior imaging workups due to lack of contrast. 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 A 6.5 x 5.2 cm Bosniac category 49F cystic right renal hilum lesion extending into the lower pole with several internal septation, known calcification, larger than previously noted. Some calyceal dilation along upper margin. No definite nodularity.   He underwent cryoablation with bx on 11/24/18.   MRI of Abd w and w/o contrast indicates the Bosniak 2 F represents interpolar and lower pole calices with dilation adjacent to a cortical cyst and a renal sinus cysts. No evidence of recurrent neoplasm.   Referred back to [place] due to concern of complex cyst in right lower pole and is in fact a chronic obstructive lower pole collecting system that is progressive.   No issues with recurrent UTIs or pyelonephritis.   Reports of urgency. Denies back/flank pain, gross hematuria, UTI or bothersome urinary symptoms.   Creatinine has been fairly stable  at 1.1.   Observations/Objective:  He is accompanied today by his health care power of attorney (sister), a family friend, and a caretaker.   Pertinent Imaging:  ADDENDUM REPORT: 11/16/2019 15:02    ADDENDUM:  After reviewing ablation images from 11/24/2018 the Bosniak 2 F  appears to represent interpolar and lower pole calices with dilation  adjacent to a cortical cyst and a renal sinus cyst. The significance  of these findings is uncertain and perhaps slightly worsened  progressive over time. Follow-up excretory imaging and or hematuria  study could potentially be for performed for further assessment this  would give both parenchymal enhancement around the collecting system  elements and excretory phase assessment. Findings were discussed  with Dr. Anselm Pancoast radiology via telephone at time of dictation of this  addendum.      Electronically Signed  By: Zetta Bills M.D.  On: 11/16/2019 15:02   I have personally reviewed the images and agree with radiologist interpretation.   Assessment and Plan:   1. Renal mass  S/p cryoablation with bx on 11/24/18  No reoccurrence indicated by interventional radiologist    2. Right hydronephrosis/ renal cyst Recent imaging of cyst on kidney significant for obstruction in right kidney which may cause problems including pain, infection and thinning- appears to be worsening radiographically  Discussed options including active surveillance vs. aggressive surgical intervention vs. Lasix study vs CT for further diagnostic evaluation  At this time no sequela of infections or obstruction  Given his multiple medical issues, observation is likely preferred intervention  Imaging to  assess the degree of obstruction may be helpful to establish a baseline, assess split function and continue to monitor for worsening obstruction/loss of cortical function down the road.  After discussion today, the patient proxy, Ms. Laurance Flatten is most interested in  imaging.  Return in couple of months with Lasix study results     I discussed the assessment and treatment plan with the patient. The patient was provided an opportunity to ask questions and all were answered. The patient agreed with the plan and demonstrated an understanding of the instructions.    The patient was advised to call back or seek an in-person evaluation if the symptoms worsen or if the condition fails to improve as anticipated.     Mario Liu, am acting as a scribe for Dr. Hollice Liu,    I have reviewed the above documentation for accuracy and completeness, and I agree with the above.   Mario Espy, MD  I spent 30 total minutes on the day of the encounter including pre-visit review of the medical record, face-to-face time with the patient, and post visit ordering of labs/imaging/tests.  Case was discussed with Dr. Louanna Raw as well.  All imaging was personally reviewed.

## 2020-01-09 ENCOUNTER — Encounter: Payer: Self-pay | Admitting: Adult Health

## 2020-01-09 ENCOUNTER — Ambulatory Visit (INDEPENDENT_AMBULATORY_CARE_PROVIDER_SITE_OTHER): Payer: Medicare Other | Admitting: Adult Health

## 2020-01-09 VITALS — BP 113/75 | HR 92 | Ht 62.0 in | Wt 207.0 lb

## 2020-01-09 DIAGNOSIS — E785 Hyperlipidemia, unspecified: Secondary | ICD-10-CM | POA: Diagnosis not present

## 2020-01-09 DIAGNOSIS — I10 Essential (primary) hypertension: Secondary | ICD-10-CM

## 2020-01-09 DIAGNOSIS — Z8673 Personal history of transient ischemic attack (TIA), and cerebral infarction without residual deficits: Secondary | ICD-10-CM | POA: Diagnosis not present

## 2020-01-09 DIAGNOSIS — R4182 Altered mental status, unspecified: Secondary | ICD-10-CM | POA: Diagnosis not present

## 2020-01-09 NOTE — Progress Notes (Signed)
[facility] [address] [place]. [place] [address] [phone]       OFFICE FOLLOW-UP NOTE  [name] Date of Birth:  [birth year] Medical Record Number:  [id number]    Reason for visit: Stroke [facility] provider: Dr. Leonie Man    Chief complaint: Chief Complaint  Patient presents with  . Follow-up    RM 9, with group home worker [name]. Doing well.      HPI:  Today, 01/09/2020, [name] returns for follow-up accompanied by group home caregiver.  He has been stable from a neurological standpoint with residual stroke deficits of dysarthria which has been stable; denies new or worsening stroke/TIA symptoms.  Previously on Vimpat for seizure management but discontinued during admission for AMS in 02/2018 without recurrent seizure activity.  Multiple prior episodes some with ED evaluations of altered mental status with transient confusion, worsening cognition and lethargy with repeat EEG unremarkable.  Caregiver reports moving patient to a different group home approximately 3 months ago in hopes of behavioral changes/improvement which significantly improved overall mood and has not had any reoccurring altered mental status changes or concerns since that time.  Continues on clopidogrel and atorvastatin for secondary stroke prevention.  Blood pressure today 113/75.  No concerns at this time.     History provided for reference purposes only Update 10/10/2019 JM: [name] is a 59 year old male who is being seen today for 3-month follow-up regarding history of stroke, seizures and transient altered mental status accompanied by group home transporter/caregiver.  Residual stroke deficits of dysarthria and reported dysphagia has been stable.  Due to concerns with episodes of altered mental status EEG and MRI w/wo ordered.  MRI w/wo on 08/02/2019 showed chronic left parietal and left temporal ischemic infarction but no acute abnormalities.  EEG has not been completed at this time  due to difficulty contacting group home.  Caregiver does report having an episode last week of having difficulty with ambulation and standing and had to use wheelchair to bring patient in to see his PCP.  Per caregiver, questionable UTI and was started on antibiotics and increase hydration.  Cognitive impairment has been stable without worsening.  He continues to follow with Dr. Rosine Door psychiatry with ongoing use of multiple psychiatric medications.  Continues on Plavix and atorvastatin for secondary stroke prevention without side effects.  Blood pressure today 122/72.  No further concerns at this time.  Update via virtual visit 07/11/2019 JM: [name] is a 59 year old male who is being seen today for follow-up.  He is accompanied by his group home caregiver, Rise Paganini.  Since prior visit, he has been transition to a different group home in 09/2018.  Since prior visit, he has had multiple ED admissions for generalized weakness and AMS with all work-up unremarkable.  Most recent ED admission on 06/27/2019 due to concerns of generalized weakness and urinating on himself with work-up unremarkable.  Per ED note, differential diagnosis possible unwitnessed seizure with incontinence and postictal lethargy but no medication changes.  Recommended to follow-up with neurology outpatient.  Prior history of seizures on Vimpat but was discontinued after AMS hospitalization in 02/2018 due to possible side effects.  Per discussion with caregiver, she states he was "out of it" with increased confusion, decreased cognition and lethargy for 2 to 3 days and then resolved spontaneously.  Cognition/memory has been stable per caregiver with occasional incontinence but caregiver believes this is typically due to waiting too long to go to the bathroom.  He will occasionally need help  with daily functioning such as getting dressed but for the most part independent.  He continues to ambulate without any recent falls.  She denies any  worsening of his cognition or memory.  Denies any medication changes.  Currently on bupropion 150 mg daily, Celexa 40 mg daily, Klonopin 0.5 mg daily as needed for anxiety, Aricept 5 mg nightly, trazodone 50 mg nightly and risperidone 4 mg nightly.  She denies any witnessed seizure activity or seizure type symptoms.  Continues on Plavix and Lipitor for secondary stroke prevention without side effects.  Continues to follow with psychiatry regularly.  Update 05/27/2018 JM: Patient is being seen today for scheduled follow-up visit and is accompanied by group home caregiver.  Since previous appointment, he was seen at [facility] on 03/08/2018 with concerns of altered mental status.  He was seen previously on 03/01/2018 with similar complaints and was diagnosed with a UTI at that time.  UTI negative on 03/08/2018 admission.  MRI head reviewed and was negative for acute abnormality.  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  Patient was discharged back to group home in stable condition.  Patient states overall he has been doing well.  He has not had additional altered mental status episodes since most recent hospitalization.  On the hospitalization of 03/08/2018, Vimpat was discontinued for unclear reasons -possibly due to potential side effects of increased fatigue and altered mental status.  Since thisdiscontinuation, patient has not had any seizure like activity.  He has been more active in his group home.  Continues to take Plavix and Lipitor without side effects for secondary stroke prevention.  He was seen by psychiatry who restarted him on his psychiatric medications.  Denies new or worsening stroke/TIA symptoms or seizure-like activity.  11/24/2017 visit Dr. Leonie Man :Patient is seen today for follow-up after his last  visit 3 months ago. He is accompanied by his caregiver from the group home. Patient is unable to provide history. I have reviewed notes which accompany him and also I  called and spoke to a nurse at the group home where he stays. Patient apparently was visiting family during Christmas time and had a bunch of seizures. Detailed description is not available. Patient was initially started on Keppra but for some reason was switched to Vimpat. He is currently on 200 mg twice daily and apparently tolerating it well and has had no witnessed seizures in the group home. He is otherwise in good health with no neurological changes. His tolerating aspirin without any bruising or bleeding. His blood pressure seems well controlled. He has not had any recurrent stroke or TIA symptoms. Patient has not had any brain imaging or EEG study done for seizures.  Stroke admission 04/2017: Mr. Debaun is a 59yo male with PMH significant for depression, Down's syndrome (currently living in a group home), COPD, and tobacco use who presents with aphasia since this morning at around 7am. History obtained from group home caregivers. Patient was in his usual state of health last night before he went to bed around 10pm. He was woken this morning and got up to get dressed. He came down to the kitchen and his caregiver noted that he was holding his underwear and a shirt in his hands, was not talking, and kept looking to his left side. No gait abnormalities. Caregivers state that he  has not complained of any chest pain, palpitations, trouble breathing, fevers, or other symptoms recently. Patient has never had a stroke before. They called EMS and he was brought to [facility]. Head CT at [facility] demonstrated hypodensity in L MCA territory, consistent with infarct. He was transferred to [facility] for further evaluation and potential intervention. Patient was not administered IV t-PA secondary to unclear time of onset. CT angiogram and perfusion were obtained which  showed a small mismatch but given patient's poor baseline functioning he was not considered a candidate for endovascular treatment. MRI scan of the brain showed a large left MCA infarct with small petechial hemorrhagic transformation. 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 Lower extremity venous Dopplers were negative for DVT. Transthoracic echo showed normal ejection fraction. Bubble study was positive for small PFO/atrial septal defect. LDL cholesterol was elevated to 80 mg percent and hemoglobin A1c was 5.9. It was decided not to consider further workup with a TEE and loop recorder given his poor functional baseline he is not a candidate for PFO closure on anticoagulation. Patient was started on Lipitor and aspirin.     ROS:   14 system review of systems is positive for see HPI and all other systems negative   PMH:  Past Medical History:  Diagnosis Date  . Acute ischemic left MCA stroke ([place])   . Altered mental status 03/09/2018  . Baker's cyst of knee, left 2018  . Bilateral renal cysts   . COPD (chronic obstructive pulmonary disease) ([place])   . DDD (degenerative disc disease), lumbar   . Down's syndrome   . GERD (gastroesophageal reflux disease)   . Hepatitis C   . Hepatitis C antibody test positive 05/16/2017  . Hx of cholecystectomy   . Hypothyroidism   . Kidney cysts   . Lumbar spondylosis   . Major depressive disorder    Was treated at facility in [place] ([facility]) years ago  . PFO with atrial septal aneurysm   . PONV (postoperative nausea and vomiting)   . Recurrent falls 03/01/2018  . Right kidney mass   . Seizure disorder ([place]) 11/24/2017  . Thrombocytopenia ([place])   . Tobacco abuse   . Vitamin D deficiency     Social History:  Social History   Socioeconomic History  . Marital status: Single    Spouse name: Not on file  . Number of children: Not on file  . Years of education: Not on file  . Highest education level: Not on file  Occupational History  . Not  on file  Tobacco Use  . Smoking status: Current Some Day Smoker    Packs/day: 0.50    Years: 39.00    Pack years: 19.50    Types: Cigarettes  . Smokeless tobacco: Never Used  . Tobacco comment: pt is still smoking  Substance and Sexual Activity  . Alcohol use: Not Currently  . Drug use: No  . Sexual activity: Not on file  Other Topics Concern  . Not on file  Social History Narrative  . Not on file   Social Determinants of Health   Financial Resource Strain:   . Difficulty of Paying Living Expenses:   Food Insecurity:   . Worried About Charity fundraiser in the Last Year:   . Arboriculturist in the Last Year:   Transportation Needs:   . Film/video editor (Medical):   Marland Kitchen Lack of Transportation (Non-Medical):   Physical Activity:   . Days of Exercise  per Week:   . Minutes of Exercise per Session:   Stress:   . Feeling of Stress :   Social Connections:   . Frequency of Communication with Friends and Family:   . Frequency of Social Gatherings with Friends and Family:   . Attends Religious Services:   . Active Member of Clubs or Organizations:   . Attends Archivist Meetings:   Marland Kitchen Marital Status:   Intimate Partner Violence:   . Fear of Current or Ex-Partner:   . Emotionally Abused:   Marland Kitchen Physically Abused:   . Sexually Abused:     Medications:   Current Outpatient Medications on File Prior to Visit  Medication Sig Dispense Refill  . acetaminophen (TYLENOL) 325 MG tablet Take 650 mg by mouth every 6 (six) hours as needed.    Marland Kitchen albuterol (PROVENTIL HFA;VENTOLIN HFA) 108 (90 Base) MCG/ACT inhaler Inhale 1-2 puffs into the lungs every 4 (four) hours as needed for wheezing or shortness of breath.     Marland Kitchen alendronate (FOSAMAX) 70 MG tablet Take 70 mg by mouth once a week. Fridays    . atorvastatin (LIPITOR) 40 MG tablet Take 40 mg by mouth at bedtime.     . benztropine (COGENTIN) 1 MG tablet Take 1 mg by mouth at bedtime.    Marland Kitchen buPROPion (WELLBUTRIN XL) 150 MG 24  hr tablet Take 150 mg by mouth daily.     . Calcium Carb-Cholecalciferol (CALCIUM 600-D PO) Take by mouth 2 (two) times daily.    . cholecalciferol (VITAMIN D3) 25 MCG (1000 UT) tablet Take 1,000 Units by mouth daily.    . citalopram (CELEXA) 40 MG tablet Take 40 mg by mouth daily.    . clonazePAM (KLONOPIN) 0.5 MG tablet Take 0.5 mg by mouth daily as needed for anxiety (at bedtime).    . clopidogrel (PLAVIX) 75 MG tablet Take 75 mg daily by mouth.    . diphenhydrAMINE HCl (BENADRYL ALLERGY PO) Take by mouth as needed.    . donepezil (ARICEPT) 5 MG tablet Take 5 mg by mouth at bedtime.    . fluticasone (FLONASE) 50 MCG/ACT nasal spray Place 1 spray into both nostrils daily.    . food thickener (THICK IT) POWD Take by mouth as needed.    . Glycopyrrolate-Formoterol (BEVESPI AEROSPHERE) 9-4.8 MCG/ACT AERO Inhale 2 puffs into the lungs 2 (two) times daily.    . Loperamide HCl (IMODIUM PO) Take by mouth as needed.    . loratadine (CLARITIN) 10 MG tablet Take 10 mg by mouth daily.    . magnesium hydroxide (MILK OF MAGNESIA) 400 MG/5ML suspension Take by mouth daily as needed for mild constipation.    . Nutritional Supplements (BOOST GLUCOSE CONTROL) LIQD Take by mouth 2 (two) times daily.    . risperidone (RISPERDAL) 4 MG tablet Take 4 mg by mouth at bedtime.    . traZODone (DESYREL) 50 MG tablet Take 1 tablet (50 mg total) by mouth at bedtime.     Current Facility-Administered Medications on File Prior to Visit  Medication Dose Route Frequency Provider Last Rate Last Admin  . lacosamide (VIMPAT) tablet 200 mg  200 mg Oral BID Garvin Fila, MD        Allergies:  No Known Allergies     Vitals Today's Vitals   01/09/20 1300  BP: 113/75  Pulse: 92  Weight: 207 lb (93.9 kg)  Height: 5\' 2"  (1.575 m)   Body mass index is 37.86 kg/m.   Physical exam  General: well developed, well nourished, pleasant middle-aged African-American male, seated, in no evident distress Head: head  normocephalic and atraumatic.   Neck: supple with no carotid or supraclavicular bruits Cardiovascular: regular rate and rhythm, no murmurs Musculoskeletal: no deformity Skin:  no rash/petichiae Vascular:  Normal pulses all extremities   Neurologic Exam Mental Status: Awake and fully alert. Moderate dysarthria. DIsoriented to place and time but oriented to self. Attention span, concentration and fund of knowledge poor -history provided by group home caregiver.  Follow simple step commands.  Mood and affect appropriate.  Cranial Nerves: Pupils equal, briskly reactive to light. Extraocular movements full without nystagmus. Visual fields full to confrontation. Hearing intact. Facial sensation intact. Face, tongue, palate moves normally and symmetrically.  Motor: Normal bulk and tone. Normal strength in all tested extremity muscles. Sensory.: intact to touch , pinprick , position and vibratory sensation.  Coordination: Rapid alternating movements normal in all extremities. Finger-to-nose and heel-to-shin performed accurately bilaterally. Gait and Station: Arises from chair without difficulty. Stance is normal. Gait demonstrates normal stride length and balance Reflexes: 1+ and symmetric. Toes downgoing.         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  He has had multiple ED evaluations and hospitalizations due to transient altered mental status and transient weakness secondary to unknown etiology. Repeat MRI unremarkable.  AED discontinued during 02/2018 admission for unclear reasons but has not had any witnessed seizure activity.  Transient altered mental status episodes have subsided after changing group homes with significant improvement of overall mood and functioning   PLAN: -Prior fluctuation of mental status high suspicion  for behavioral related with underlying psych component.  No indication or need to restart AED as no recurrent seizure activity or symptoms -Continue clopidogrel 75 mg daily  and Lipitor for secondary stroke prevention -F/u with PCP regarding your HLD and HTN management -Advised to continue to stay active and maintain a healthy diet -continue to monitor BP at home -Maintain strict control of hypertension with blood pressure goal below 130/90, diabetes with hemoglobin A1c goal below 6.5% and cholesterol with LDL cholesterol (bad cholesterol) goal below 70 mg/dL. I also advised the patient to eat a healthy diet with plenty of whole grains, cereals, fruits and vegetables, exercise regularly and maintain ideal body weight.   Follow-up in 6 months or call earlier if needed  I spent 23 minutes of face-to-face and non-face-to-face time with patient and caregiver.  This included previsit chart review, lab review, study review, order entry, electronic health record documentation, patient education   Frann Rider, [facility]  [facility] [address] [place], [place] [address]  Phone [phone] Fax [phone] Note: This document was prepared with digital dictation and possible smart phrase technology. Any transcriptional errors that result from this process are unintentional.

## 2020-01-09 NOTE — Patient Instructions (Addendum)
Continue clopidogrel 75 mg daily  and atorvastatin for secondary stroke prevention  Continue to follow up with PCP regarding cholesterol and blood pressure management   Maintain strict control of hypertension with blood pressure goal below 130/90, diabetes with hemoglobin A1c goal below 6.5% and cholesterol with LDL cholesterol (bad cholesterol) goal below 70 mg/dL. I also advised the patient to eat a healthy diet with plenty of whole grains, cereals, fruits and vegetables, exercise regularly and maintain ideal body weight.  Followup in the future with me in 6 months or call earlier if needed        Thank you for coming to see [place] at [facility]. I hope we have been able to provide you high quality care today.  You may receive a patient satisfaction survey over the next few weeks. We would appreciate your feedback and comments so that we may continue to improve ourselves and the health of our patients.

## 2020-01-10 ENCOUNTER — Encounter: Payer: Self-pay | Admitting: Adult Health

## 2020-01-11 ENCOUNTER — Ambulatory Visit
Admission: RE | Admit: 2020-01-11 | Discharge: 2020-01-11 | Disposition: A | Discharge status: Home / Self Care | Payer: Medicare Other | Source: Ambulatory Visit | Attending: Adult Health | Admitting: Adult Health

## 2020-01-11 ENCOUNTER — Other Ambulatory Visit: Payer: Self-pay

## 2020-01-11 ENCOUNTER — Other Ambulatory Visit: Payer: Self-pay | Admitting: Adult Health

## 2020-01-11 DIAGNOSIS — R600 Localized edema: Secondary | ICD-10-CM

## 2020-01-16 NOTE — Progress Notes (Signed)
I agree with the above plan 

## 2020-01-30 ENCOUNTER — Other Ambulatory Visit: Payer: Self-pay

## 2020-01-30 ENCOUNTER — Encounter
Admission: RE | Admit: 2020-01-30 | Discharge: 2020-01-30 | Disposition: A | Discharge status: Home / Self Care | Payer: Medicare Other | Source: Ambulatory Visit | Attending: Urology | Admitting: Urology

## 2020-01-30 DIAGNOSIS — N1339 Other hydronephrosis: Secondary | ICD-10-CM | POA: Diagnosis present

## 2020-01-30 MED ORDER — TECHNETIUM TC 99M MERTIATIDE
5.0000 | Freq: Once | INTRAVENOUS | Status: AC | PRN
  Administered 2020-01-30: 5.16 via INTRAVENOUS

## 2020-01-30 MED ORDER — FUROSEMIDE 10 MG/ML IJ SOLN
46.9500 mg | Freq: Once | INTRAMUSCULAR | Status: AC
  Administered 2020-01-30: 46.95 mg via INTRAVENOUS
  Filled 2020-01-30: qty 4.7

## 2020-02-07 ENCOUNTER — Telehealth: Payer: Self-pay | Admitting: *Deleted

## 2020-02-07 NOTE — Telephone Encounter (Addendum)
Called guardian and group home [facility] to inform and scheduled virtual visit appointment, prefers telephone conference call. Mailed reminders, voiced understanding.   ----- Message from Hollice Espy, MD sent at 02/01/2020  9:10 AM EDT ----- Please arrange virtual visit follow up with patient's sister and caretaker to review results in the next few weeks.    Hollice Espy, MD

## 2020-02-29 ENCOUNTER — Telehealth (INDEPENDENT_AMBULATORY_CARE_PROVIDER_SITE_OTHER): Payer: Medicare Other | Admitting: Urology

## 2020-02-29 ENCOUNTER — Other Ambulatory Visit: Payer: Self-pay

## 2020-02-29 DIAGNOSIS — I639 Cerebral infarction, unspecified: Secondary | ICD-10-CM | POA: Diagnosis not present

## 2020-02-29 DIAGNOSIS — R569 Unspecified convulsions: Secondary | ICD-10-CM

## 2020-02-29 NOTE — Progress Notes (Signed)
Virtual Visit via Telephone Note  I connected with Mario Liu on 02/29/20 at  4:15 PM EDT by telephone and verified that I am speaking with the correct person using two identifiers. Mario Liu, Mr. Ashmead and his care taker on 3 way speaker.  We initially started using audiovisual but due to poor connectivity, we ended up transitioning to phone only.  Location: Patient: home  Provider: office    I discussed the limitations, risks, security and privacy concerns of performing an evaluation and management service by telephone and the availability of in person appointments. I also discussed with the patient that there may be a patient responsible charge related to this service. The patient expressed understanding and agreed to proceed.  History of Present Illness: Mario Liu is a 59 y.o. M  with Down syndrome who returns today via telephone call for the evaluation and management of right renal mass.   MRI Abd w/wo contrast on 09/28/2018 found the following. A 3.3 x 2.8 cm enhancing mass of the right kidney upper pole that was relatively occult on prior imaging workups due to lack of contrast. 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 A 6.5 x 5.2 cm Bosniac category 11F cystic right renal hilum lesion extending into the lower pole with several internal septation, known calcification, larger than previously noted. Some calyceal dilation along upper margin. No definite nodularity.   He underwent cryoablation with bx on 11/24/18.   MRI of Abd w and w/o contrast indicates the Bosniak 2 F represents interpolar and lower pole calices with dilation adjacent to a cortical cyst and a renal sinus cysts. No evidence of recurrent neoplasm.   Referred back to [place] due to concern of complex cyst in right lower pole and is in fact a chronic obstructive lower pole collecting system that is progressive.    NM Renal Imaging Flow w/Pharm from 01/30/20 revealed normal left  renogram. RIGHT hydronephrosis with poor clearance of tracer prior to Lasix administration. Minimal Lasix response is identified; however, patient prematurely terminated the procedure and insufficient imaging was performed following Lasix to determine adequacy of urinary outflow from the RIGHT kidney.  He is asymptomatic from this.  Is not no stones, no infection, no flank pain.  His creatinine is 1.14.  Observations/Objective: Pt is engaged and asking good questions.   Pertinent Imagings:  CLINICAL DATA:  Hydronephrosis  EXAM: NUCLEAR MEDICINE RENAL SCAN WITH DIURETIC ADMINISTRATION  TECHNIQUE: Radionuclide angiographic and sequential renal images were obtained after intravenous injection of radiopharmaceutical. Imaging was continued during slow intravenous injection of Lasix approximately 15 minutes after the start of the examination. Procedure was terminated prematurely due to movement and patient needing to void and being unable to hold urine.  RADIOPHARMACEUTICALS:  5.16 mCi Technetium-55m MAG3 IV  COMPARISON:  None  Correlation: MR abdomen 11/08/2019  FINDINGS: Flow:  Suboptimal tracer bolus.  Left renogram: Normal uptake, concentration and excretion of tracer by LEFT kidney. Rapid clearance of tracer from the LEFT kidney both before and continuing following Lasix administration. No significant residual tracer within the LEFT kidney at the conclusion of the exam. Analysis of the renogram curve demonstrates a normal time to peak activity of 3 minutes with fall to half maximum activity approximately 8 minutes  Right renogram: Delayed uptake, concentration and excretion of tracer by RIGHT kidney. RIGHT renal collecting system dilatation identified, initially as a filling defect at the central kidney but gradually filling in with contrast over the course of the  exam. Poor clearance of tracer from the kidney prior to Lasix. A minimal Lasix response is identified  but the exam was terminated prior to full assessment. Analysis of the renogram curve demonstrates a delayed a delayed time to peak activity of 20.5 minutes.  Differential:  Left kidney = 68 %  Right kidney = 32 %  T1/2 post Lasix :  Left kidney = 7.3 min  Right kidney = N/A min  IMPRESSION: Normal LEFT renogram.  RIGHT hydronephrosis with poor clearance of tracer prior to Lasix administration.  Minimal Lasix response is identified; however, patient prematurely terminated the procedure and insufficient imaging was performed following Lasix to determine adequacy of urinary outflow from the RIGHT kidney.   Electronically Signed   By: Lavonia Dana M.D.   On: 01/30/2020 11:54  I have personally reviewed the images and agree with radiologist interpretation.   Assessment and Plan:  1. Renal mass  S/p cryoablation with bx on 11/24/18  No reoccurrence indicated by interventional radiologist    2. Right hydronephrosis Recent imaging of cyst on kidney significant for obstruction in right kidney which may cause problems including pain, infection and thinning- appears to be worsening radiographically  Lasix study revealed right hydronephrosis w/ poor clearance of tracer prior to Lasix administration. Minimal Lasix response identified however pt prematurely terminated procedure and insufficient imaging was performed following Lasix. Normal left renogram.  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 aggressive surgical intervention along w/ risks and benefits  Currently has no significant sequela from this other than decreased right renal function however the chronicity of this demise is also somewhat unknown, present since at least 2016 possibly longer Discussed increased possibility of infections, worsening renal  function, and stones Given his multiple medical issues, observation is likely preferred intervention [place] Nails his guardian understands that he may continue to lose further right renal function over time, how quickly this may occur is unknown Given his medical comorbidities, would more strongly favor conservative management Current nuclear medicine scan to serve as baseline Return in 6 months w/ labs/urine  Follow Up Instructions:    I discussed the assessment and treatment plan with the patient. The patient was provided an opportunity to ask questions and all were answered. The patient agreed with the plan and demonstrated an understanding of the instructions.   The patient was advised to call back or seek an in-person evaluation if the symptoms worsen or if the condition fails to improve as anticipated.  I provided 30 minutes of non-face-to-face time during this encounter.   Jamas Lav, am acting as a scribe for Dr. Hollice Espy,  I have reviewed the above documentation for accuracy and completeness, and I agree with the above.   Hollice Espy, MD

## 2020-03-01 ENCOUNTER — Encounter: Payer: Self-pay | Admitting: Urology

## 2020-03-26 ENCOUNTER — Other Ambulatory Visit: Payer: Medicare Other

## 2020-03-26 ENCOUNTER — Ambulatory Visit: Payer: Medicare Other | Admitting: Oncology

## 2020-03-28 ENCOUNTER — Inpatient Hospital Stay: Payer: Medicare Other | Admitting: Oncology

## 2020-03-28 ENCOUNTER — Inpatient Hospital Stay: Payer: Medicare Other

## 2020-03-29 ENCOUNTER — Inpatient Hospital Stay (HOSPITAL_BASED_OUTPATIENT_CLINIC_OR_DEPARTMENT_OTHER): Payer: Medicare Other | Admitting: Oncology

## 2020-03-29 ENCOUNTER — Encounter: Payer: Self-pay | Admitting: Oncology

## 2020-03-29 ENCOUNTER — Other Ambulatory Visit: Payer: Self-pay

## 2020-03-29 ENCOUNTER — Inpatient Hospital Stay: Payer: Medicare Other | Attending: Oncology

## 2020-03-29 VITALS — BP 99/66 | HR 82 | Temp 96.2°F | Resp 18 | Wt 204.3 lb

## 2020-03-29 DIAGNOSIS — E039 Hypothyroidism, unspecified: Secondary | ICD-10-CM | POA: Diagnosis not present

## 2020-03-29 DIAGNOSIS — K219 Gastro-esophageal reflux disease without esophagitis: Secondary | ICD-10-CM | POA: Diagnosis not present

## 2020-03-29 DIAGNOSIS — Z7982 Long term (current) use of aspirin: Secondary | ICD-10-CM | POA: Diagnosis not present

## 2020-03-29 DIAGNOSIS — F329 Major depressive disorder, single episode, unspecified: Secondary | ICD-10-CM | POA: Diagnosis not present

## 2020-03-29 DIAGNOSIS — D696 Thrombocytopenia, unspecified: Secondary | ICD-10-CM

## 2020-03-29 DIAGNOSIS — G40909 Epilepsy, unspecified, not intractable, without status epilepticus: Secondary | ICD-10-CM | POA: Insufficient documentation

## 2020-03-29 DIAGNOSIS — F17210 Nicotine dependence, cigarettes, uncomplicated: Secondary | ICD-10-CM | POA: Diagnosis not present

## 2020-03-29 DIAGNOSIS — Q909 Down syndrome, unspecified: Secondary | ICD-10-CM | POA: Insufficient documentation

## 2020-03-29 DIAGNOSIS — Z79899 Other long term (current) drug therapy: Secondary | ICD-10-CM | POA: Insufficient documentation

## 2020-03-29 DIAGNOSIS — Z8249 Family history of ischemic heart disease and other diseases of the circulatory system: Secondary | ICD-10-CM | POA: Diagnosis not present

## 2020-03-29 DIAGNOSIS — Z833 Family history of diabetes mellitus: Secondary | ICD-10-CM | POA: Diagnosis not present

## 2020-03-29 DIAGNOSIS — Z8673 Personal history of transient ischemic attack (TIA), and cerebral infarction without residual deficits: Secondary | ICD-10-CM | POA: Diagnosis not present

## 2020-03-29 DIAGNOSIS — J449 Chronic obstructive pulmonary disease, unspecified: Secondary | ICD-10-CM | POA: Diagnosis not present

## 2020-03-29 LAB — COMPREHENSIVE METABOLIC PANEL
ALT: 22 U/L (ref 0–44)
AST: 25 U/L (ref 15–41)
Albumin: 3.4 g/dL — ABNORMAL LOW (ref 3.5–5.0)
Alkaline Phosphatase: 49 U/L (ref 38–126)
Anion gap: 7 (ref 5–15)
BUN: 15 mg/dL (ref 6–20)
CO2: 25 mmol/L (ref 22–32)
Calcium: 9 mg/dL (ref 8.9–10.3)
Chloride: 104 mmol/L (ref 98–111)
Creatinine, Ser: 1.26 mg/dL — ABNORMAL HIGH (ref 0.61–1.24)
GFR calc Af Amer: >60 mL/min (ref >60)
GFR calc non Af Amer: >60 mL/min (ref >60)
Glucose, Bld: 169 mg/dL — ABNORMAL HIGH (ref 70–99)
Potassium: 4.1 mmol/L (ref 3.5–5.1)
Sodium: 136 mmol/L (ref 135–145)
Total Bilirubin: 0.5 mg/dL (ref 0.3–1.2)
Total Protein: 7 g/dL (ref 6.5–8.1)

## 2020-03-29 LAB — CBC WITH DIFFERENTIAL/PLATELET
Abs Immature Granulocytes: 0 10*3/uL (ref 0.00–0.07)
Basophils Absolute: 0 10*3/uL (ref 0.0–0.1)
Basophils Relative: 1 %
Eosinophils Absolute: 0.3 10*3/uL (ref 0.0–0.5)
Eosinophils Relative: 7 %
HCT: 36.1 % — ABNORMAL LOW (ref 39.0–52.0)
Hemoglobin: 11.9 g/dL — ABNORMAL LOW (ref 13.0–17.0)
Immature Granulocytes: 0 %
Lymphocytes Relative: 48 %
Lymphs Abs: 2 10*3/uL (ref 0.7–4.0)
MCH: 28.1 pg (ref 26.0–34.0)
MCHC: 33 g/dL (ref 30.0–36.0)
MCV: 85.1 fL (ref 80.0–100.0)
Monocytes Absolute: 0.3 10*3/uL (ref 0.1–1.0)
Monocytes Relative: 8 %
Neutro Abs: 1.4 10*3/uL — ABNORMAL LOW (ref 1.7–7.7)
Neutrophils Relative %: 36 %
Platelets: 119 10*3/uL — ABNORMAL LOW (ref 150–400)
RBC: 4.24 MIL/uL (ref 4.22–5.81)
RDW: 12.4 % (ref 11.5–15.5)
WBC: 4 10*3/uL (ref 4.0–10.5)
nRBC: 0 % (ref 0.0–0.2)

## 2020-03-29 NOTE — Progress Notes (Signed)
[facility] follow up Visit:  Patient Care Team: [name], FNP as PCP - General (Family Medicine) Deboraha Sprang, MD as PCP - Cardiology (Cardiology) Earlie Server, MD as Consulting Physician (Oncology)  REASON FOR VISIT Follow up for thrombocytopenia.   HISTORY OF PRESENTING ILLNESS: Mario Liu 59 y.o. male is here for evaluation of low platelet count. He has Down syndrome.  The patient lives in a group home and is accompanied by caregiver from group home. He is referred to [place] for evaluation of thrombocytopenia. He had labs on with primary care provider on December 15 2016 which showed WBC 5.3 hemoglobin 14.3 MCV 88 RDW 13.8 MPV 11.37 platelet 111 lymphocyte 46.69% absolute lymphocytes 2.5 normal is BMPnormal LFT. It was noted that hepatitis C antibody reactive. Patient was seen by [facility] for workupof her positive hepatitis C antibody Further tests including HCV RNA by PCR was negative.  Hep B core antibody negative, Hb surface antibody nonreactive,A antibody positive,Hep A IgM negative. So that patient did not really have hepatitis C, additional ultrasound and fibrocystic was canceled by GI.Marland Kitchen Patient reports feeling well, good energy level, good appetite, denies any weight loss fatigue, fever or chills. Patient is a poor historian. He told me that he used to drink liquor but haven't drink lately. In September 2018, patient had a stroke, acute ischemic left MCA stroke. His in getting physical therapy. Denies any easy bruising or bleeding. He appears to be less duct if compared to prior to the stroke. Able to have simple conversation with me.Denies any recent infection, bleeding, surgery. He has some dysphagia and speech intelligibility which slowly improving with the physical therapy and speech therapy according to the group home staff.  admitted from 07/05/2018 to 07/07/2018 due to after the mental status. He had acute cystitis urine culture grow strep   agalactiae.  Patient was treated with IV antibiotics and switched to oral Augmentin. Acute kidney injury/dehydration resolved after hydration.    INTERVAL HISTORY Patient presents to clinic for all follow-up of thrombocytopenia.  Patient has a history of chronic cognitive impairment. Denies any pain today.  He was accompanied by staff from his group home.    Review of Systems  Unable to perform ROS: Other (Chronic cognitive impairment.)    MEDICAL HISTORY: Past Medical History:  Diagnosis Date  . Acute ischemic left MCA stroke ([place])   . Altered mental status 03/09/2018  . Baker's cyst of knee, left 2018  . Bilateral renal cysts   . COPD (chronic obstructive pulmonary disease) ([place])   . DDD (degenerative disc disease), lumbar   . Down's syndrome   . GERD (gastroesophageal reflux disease)   . Hepatitis C   . Hepatitis C antibody test positive 05/16/2017  . Hx of cholecystectomy   . Hypothyroidism   . Kidney cysts   . Lumbar spondylosis   . Major depressive disorder    Was treated at facility in [place] ([facility] years ago  . PFO with atrial septal aneurysm   . PONV (postoperative nausea and vomiting)   . Recurrent falls 03/01/2018  . Right kidney mass   . Seizure disorder ([place]) 11/24/2017  . Thrombocytopenia ([place])   . Tobacco abuse   . Vitamin D deficiency     SURGICAL HISTORY: Past Surgical History:  Procedure Laterality Date  . IR RADIOLOGIST EVAL & MGMT  10/13/2018  . IR RADIOLOGIST EVAL & MGMT  03/03/2019  . IR RADIOLOGIST EVAL & MGMT  11/16/2019  . LAPAROSCOPIC CHOLECYSTECTOMY    . RADIOLOGY WITH ANESTHESIA Right 11/24/2018   Procedure: CT WITH ANESTHESIA RENAL CRYOABLATION WITH BIOPSY;  Surgeon: Markus Daft, MD;  Location: [facility];  Service: Anesthesiology;  Laterality: Right;    SOCIAL HISTORY: Social History   Socioeconomic History  . Marital status: Single    Spouse name: Not on file  . Number of children: Not on file  . Years of education: Not on file  .  Highest education level: Not on file  Occupational History  . Not on file  Tobacco Use  . Smoking status: Current Some Day Smoker    Packs/day: 0.50    Years: 39.00    Pack years: 19.50    Types: Cigarettes  . Smokeless tobacco: Never Used  . Tobacco comment: pt is still smoking  Vaping Use  . Vaping Use: Never used  Substance and Sexual Activity  . Alcohol use: Not Currently  . Drug use: No  . Sexual activity: Not on file  Other Topics Concern  . Not on file  Social History Narrative  . Not on file   Social Determinants of Health   Financial Resource Strain:   . Difficulty of Paying Living Expenses:   Food Insecurity:   . Worried About Charity fundraiser in the Last Year:   . Arboriculturist in the Last Year:   Transportation Needs:   . Film/video editor (Medical):   Marland Kitchen Lack of Transportation (Non-Medical):   Physical Activity:   . Days of Exercise per Week:   . Minutes of Exercise per Session:   Stress:   . Feeling of Stress :   Social Connections:   . Frequency of Communication with Friends and Family:   . Frequency of Social Gatherings with Friends and Family:   . Attends Religious Services:   . Active Member of Clubs or Organizations:   . Attends Archivist Meetings:   Marland Kitchen Marital Status:   Intimate Partner Violence:   . Fear of Current or Ex-Partner:   . Emotionally Abused:   Marland Kitchen Physically Abused:   . Sexually Abused:     FAMILY HISTORY Family History  Adopted: Yes  Problem Relation Age of Onset  . Diabetes Maternal Grandmother   . Heart attack Maternal Aunt   . Prostate cancer Neg Hx   . Bladder Cancer Neg Hx   . Kidney cancer Neg Hx     ALLERGIES:  has No Known Allergies.  MEDICATIONS:  Current Outpatient Medications  Medication Sig Dispense Refill  . acetaminophen (TYLENOL) 325 MG tablet Take 650 mg by mouth every 6 (six) hours as needed.    Marland Kitchen albuterol (PROVENTIL HFA;VENTOLIN HFA) 108 (90 Base) MCG/ACT inhaler Inhale 1-2 puffs  into the lungs every 4 (four) hours as needed for wheezing or shortness of breath.     Marland Kitchen alendronate (FOSAMAX) 70 MG tablet Take 70 mg by mouth once a week. Fridays    . aspirin 81 MG EC tablet     . atorvastatin (LIPITOR) 40 MG tablet Take 40 mg by mouth at bedtime.     . benztropine (COGENTIN) 1 MG tablet Take 1 mg by mouth at bedtime.    Marland Kitchen buPROPion (WELLBUTRIN XL) 150 MG 24 hr tablet Take 150 mg by mouth daily.     . Calcium Carb-Cholecalciferol (CALCIUM 600-D PO) Take by mouth 2 (two) times daily.    . citalopram (CELEXA) 40 MG tablet Take 40 mg by mouth  daily.    . clopidogrel (PLAVIX) 75 MG tablet Take 75 mg daily by mouth.    . donepezil (ARICEPT) 5 MG tablet Take 5 mg by mouth at bedtime.    . fluticasone (FLONASE) 50 MCG/ACT nasal spray Place 1 spray into both nostrils daily.    . food thickener (THICK IT) POWD Take by mouth as needed.    . Glycopyrrolate-Formoterol (BEVESPI AEROSPHERE) 9-4.8 MCG/ACT AERO Inhale 2 puffs into the lungs 2 (two) times daily.    Marland Kitchen loratadine (CLARITIN) 10 MG tablet Take 10 mg by mouth daily.    . magnesium hydroxide (MILK OF MAGNESIA) 400 MG/5ML suspension Take by mouth daily as needed for mild constipation.    . Nutritional Supplements (BOOST GLUCOSE CONTROL) LIQD Take by mouth 2 (two) times daily.    . risperidone (RISPERDAL) 4 MG tablet Take 4 mg by mouth at bedtime.    . tamsulosin (FLOMAX) 0.4 MG CAPS capsule Take 0.4 mg by mouth.    . traZODone (DESYREL) 50 MG tablet Take 1 tablet (50 mg total) by mouth at bedtime.    . cholecalciferol (VITAMIN D3) 25 MCG (1000 UT) tablet Take 1,000 Units by mouth daily. (Patient not taking: Reported on 03/29/2020)    . clonazePAM (KLONOPIN) 0.5 MG tablet Take 0.5 mg by mouth daily as needed for anxiety (at bedtime). (Patient not taking: Reported on 03/29/2020)    . diphenhydrAMINE HCl (BENADRYL ALLERGY PO) Take by mouth as needed. (Patient not taking: Reported on 03/29/2020)    . levETIRAcetam (KEPPRA) 500 MG tablet   (Patient not taking: Reported on 03/29/2020)    . Loperamide HCl (IMODIUM PO) Take by mouth as needed. (Patient not taking: Reported on 03/29/2020)     Current Facility-Administered Medications  Medication Dose Route Frequency Provider Last Rate Last Admin  . lacosamide (VIMPAT) tablet 200 mg  200 mg Oral BID Garvin Fila, MD        PHYSICAL EXAMINATION: ECOG PERFORMANCE STATUS: 1 - Symptomatic but completely ambulatory  Today's Vitals   03/29/20 1206  BP: 99/66  Pulse: 82  Resp: 18  Temp: (!) 96.2 F (35.7 C)  Weight: 204 lb 4.8 oz (92.7 kg)  PainSc: 0-No pain   Body mass index is 37.37 kg/m.  Physical Exam Vitals and nursing note reviewed.  Constitutional:      General: He is not in acute distress.    Appearance: He is not diaphoretic.  HENT:     Head: Normocephalic and atraumatic.     Mouth/Throat:     Pharynx: No oropharyngeal exudate.  Eyes:     General: No scleral icterus.    Pupils: Pupils are equal, round, and reactive to light.  Cardiovascular:     Rate and Rhythm: Normal rate and regular rhythm.     Heart sounds: No murmur heard.   Pulmonary:     Effort: Pulmonary effort is normal. No respiratory distress.     Breath sounds: No rales.  Chest:     Chest wall: No tenderness.  Abdominal:     General: There is no distension.     Palpations: Abdomen is soft.     Tenderness: There is no abdominal tenderness.  Musculoskeletal:        General: Normal range of motion.     Cervical back: Normal range of motion and neck supple.  Skin:    General: Skin is warm and dry.     Findings: No erythema.  Neurological:     Mental Status:  He is alert and oriented to person, place, and time.     Cranial Nerves: No cranial nerve deficit.     Motor: No abnormal muscle tone.     Coordination: Coordination normal.     Comments: Follow simple commands.  At baseline mental status.  Psychiatric:        Mood and Affect: Affect normal.    LABORATORY DATA: I have personally  reviewed the data as listed: He had labs on with primary care provider on December 15 2016 which showed WBC 5.3 hemoglobin 14.3 MCV 88 RDW 13.8 MPV 11.37 platelet 111 lymphocyte 46.69% absolute lymphocytes 2.5 normal is BMPnormal LFT. It was noted that hepatitis C antibody reactive. Patient was seen by [facility] group for workupof her positive hepatitis C antibody Further tests including HCV RNA by PCR was negative.  Hep B core antibody negative, Hb surface antibody nonreactive,A antibody positive,Hep A IgM negative. CBC    Component Value Date/Time   WBC 4.0 03/29/2020 1112   RBC 4.24 03/29/2020 1112   HGB 11.9 (L) 03/29/2020 1112   HGB 13.6 04/07/2018 1320   HCT 36.1 (L) 03/29/2020 1112   HCT 42.6 04/07/2018 1320   PLT 119 (L) 03/29/2020 1112   PLT 121 (L) 04/07/2018 1320   MCV 85.1 03/29/2020 1112   MCV 86 04/07/2018 1320   MCH 28.1 03/29/2020 1112   MCHC 33.0 03/29/2020 1112   RDW 12.4 03/29/2020 1112   RDW 12.2 (L) 04/07/2018 1320   LYMPHSABS 2.0 03/29/2020 1112   LYMPHSABS 2.3 04/07/2018 1320   MONOABS 0.3 03/29/2020 1112   EOSABS 0.3 03/29/2020 1112   EOSABS 0.4 04/07/2018 1320   BASOSABS 0.0 03/29/2020 1112   BASOSABS 0.0 04/07/2018 1320   CMP Latest Ref Rng & Units 03/29/2020 09/26/2019 06/27/2019  Glucose 70 - 99 mg/dL 169(H) 135(H) 105(H)  BUN 6 - 20 mg/dL 15 17 16   Creatinine 0.61 - 1.24 mg/dL 1.26(H) 1.14 0.96  Sodium 135 - 145 mmol/L 136 137 139  Potassium 3.5 - 5.1 mmol/L 4.1 3.7 4.5  Chloride 98 - 111 mmol/L 104 104 110  CO2 22 - 32 mmol/L 25 25 25   Calcium 8.9 - 10.3 mg/dL 9.0 9.2 9.2  Total Protein 6.5 - 8.1 g/dL 7.0 6.9 6.6  Total Bilirubin 0.3 - 1.2 mg/dL 0.5 0.5 0.7  Alkaline Phos 38 - 126 U/L 49 44 48  AST 15 - 41 U/L 25 24 26   ALT 0 - 44 U/L 22 28 24     04/21/2019.  Peripheral lab flow cytometry No monoclonal B cell population is detected. kappa:lambda ratio 1.8  There is no loss of, or aberrant expression of, the pan T cell  antigens to  suggest a  neoplastic T cell process.  CD4:CD8 ratio 2.5  No circulating blasts are detected. There is no immunophenotypic  evidence  of abnormal myeloid maturation.  Analysis of the leukocyte population shows: granulocytes 50%,  monocytes  10%, lymphocytes 40%, blasts <0.5%, B cells 8%, T cells 30%, NK cells  2%.    RADIOGRAPHIC STUDIES: I have personally reviewed the radiological images as listed and agree with the findings in the report NM Renal Imaging Flow W/Pharm  Result Date: 01/30/2020 CLINICAL DATA:  Hydronephrosis EXAM: NUCLEAR MEDICINE RENAL SCAN WITH DIURETIC ADMINISTRATION TECHNIQUE: Radionuclide angiographic and sequential renal images were obtained after intravenous injection of radiopharmaceutical. Imaging was continued during slow intravenous injection of Lasix approximately 15 minutes after the start of the examination. Procedure was terminated prematurely due  to movement and patient needing to void and being unable to hold urine. RADIOPHARMACEUTICALS:  5.16 mCi Technetium-71m MAG3 IV COMPARISON:  None Correlation: MR abdomen 11/08/2019 FINDINGS: Flow:  Suboptimal tracer bolus. Left renogram: Normal uptake, concentration and excretion of tracer by LEFT kidney. Rapid clearance of tracer from the LEFT kidney both before and continuing following Lasix administration. No significant residual tracer within the LEFT kidney at the conclusion of the exam. Analysis of the renogram curve demonstrates a normal time to peak activity of 3 minutes with fall to half maximum activity approximately 8 minutes Right renogram: Delayed uptake, concentration and excretion of tracer by RIGHT kidney. RIGHT renal collecting system dilatation identified, initially as a filling defect at the central kidney but gradually filling in with contrast over the course of the exam. Poor clearance of tracer from the kidney prior to Lasix. A minimal Lasix response is identified but the exam was terminated prior to full assessment.  Analysis of the renogram curve demonstrates a delayed a delayed time to peak activity of 20.5 minutes. Differential: Left kidney = 68 % Right kidney = 32 % T1/2 post Lasix : Left kidney = 7.3 min Right kidney = N/A min IMPRESSION: Normal LEFT renogram. RIGHT hydronephrosis with poor clearance of tracer prior to Lasix administration. Minimal Lasix response is identified; however, patient prematurely terminated the procedure and insufficient imaging was performed following Lasix to determine adequacy of urinary outflow from the RIGHT kidney. Electronically Signed   By: Lavonia Dana M.D.   On: 01/30/2020 11:54   US Venous Img Lower Unilateral Right (DVT)  Result Date: 01/11/2020 CLINICAL DATA:  Pain and swelling in the right lower extremity. EXAM: RIGHT LOWER EXTREMITY VENOUS DOPPLER ULTRASOUND TECHNIQUE: Gray-scale sonography with compression, as well as color and duplex ultrasound, were performed to evaluate the deep venous system(s) from the level of the common femoral vein through the popliteal and proximal calf veins. COMPARISON:  None. FINDINGS: VENOUS Normal compressibility of the common femoral, superficial femoral, and popliteal veins, as well as the visualized calf veins. Visualized portions of profunda femoral vein and great saphenous vein unremarkable. No filling defects to suggest DVT on grayscale or color Doppler imaging. Doppler waveforms show normal direction of venous flow, normal respiratory plasticity and response to augmentation. Limited views of the contralateral common femoral vein are unremarkable. OTHER None. Limitations: none IMPRESSION: No evidence of thrombus within the right lower extremity. Electronically Signed   By: Audie Pinto M.D.   On: 01/11/2020 15:15      ASSESSMENT/PLAN 1. Thrombocytopenia ([place])    # likely ITP.  Labs are reviewed and discussed.  Stable platelet counts.  Continue observation.   Increased creatinine to 1.26.  01/30/2020 renal scan showed right  hydronephrosis with poor clearance of tracer.  Patient was seen by urology in June.  . Follow-up in clinic in 6 months.  All questions were answered. The patient knows to call the clinic with any problems, questions or concerns.    Earlie Server, MD, PhD Hematology Oncology [facility] Pager- [phone] 03/29/2020

## 2020-03-29 NOTE — Progress Notes (Signed)
Patient/caregiver denies new problems/concerns today.

## 2020-07-10 ENCOUNTER — Encounter: Payer: Self-pay | Admitting: Adult Health

## 2020-07-10 ENCOUNTER — Ambulatory Visit: Payer: Medicare Other | Admitting: Adult Health

## 2020-07-10 NOTE — Progress Notes (Deleted)
[facility] [address] [place]. [place] [address] [phone]       OFFICE FOLLOW-UP NOTE  [name] Date of Birth:  Jan 26, 1961 Medical Record Number:  [id number]    Reason for visit: Stroke [facility] provider: Dr. Leonie Man    Chief complaint: No chief complaint on file.     HPI:  Today, 07/10/2020, Mario Liu returns for 53-month follow-up accompanied by group home caregiver who provides majority of history due to chronic cognitive impairment. He has been doing well from a neurological standpoint since prior visit without new or worsening stroke/TIA symptoms with residual dysarthria and cognitive impairment which have been stable. No reoccurring seizure activity or symptoms currently off AEDs. Remains on Plavix and atorvastatin for secondary stroke prevention without side effects. Blood pressure today ***. No concerns at this time    History provided for reference purposes only Update 01/09/2020 JM: Mario Liu returns for follow-up accompanied by group home caregiver.  He has been stable from a neurological standpoint with residual stroke deficits of dysarthria which has been stable; denies new or worsening stroke/TIA symptoms.  Previously on Vimpat for seizure management but discontinued during admission for AMS in 02/2018 without recurrent seizure activity.  Multiple prior episodes some with ED evaluations of altered mental status with transient confusion, worsening cognition and lethargy with repeat EEG unremarkable.  Caregiver reports moving patient to a different group home approximately 3 months ago in hopes of behavioral changes/improvement which significantly improved overall mood and has not had any reoccurring altered mental status changes or concerns since that time.  Continues on clopidogrel and atorvastatin for secondary stroke prevention.  Blood pressure today 113/75.  No concerns at this time.  Update 10/10/2019 JM: Mario Liu is a 59 year old male who is  being seen today for 35-month follow-up regarding history of stroke, seizures and transient altered mental status accompanied by group home transporter/caregiver.  Residual stroke deficits of dysarthria and reported dysphagia has been stable.  Due to concerns with episodes of altered mental status EEG and MRI w/wo ordered.  MRI w/wo on 08/02/2019 showed chronic left parietal and left temporal ischemic infarction but no acute abnormalities.  EEG has not been completed at this time due to difficulty contacting group home.  Caregiver does report having an episode last week of having difficulty with ambulation and standing and had to use wheelchair to bring patient in to see his PCP.  Per caregiver, questionable UTI and was started on antibiotics and increase hydration.  Cognitive impairment has been stable without worsening.  He continues to follow with Dr. Rosine Door psychiatry with ongoing use of multiple psychiatric medications.  Continues on Plavix and atorvastatin for secondary stroke prevention without side effects.  Blood pressure today 122/72.  No further concerns at this time.  Update via virtual visit 07/11/2019 JM: Mario Liu is a 59 year old male who is being seen today for follow-up.  He is accompanied by his group home caregiver, Rise Paganini.  Since prior visit, he has been transition to a different group home in 09/2018.  Since prior visit, he has had multiple ED admissions for generalized weakness and AMS with all work-up unremarkable.  Most recent ED admission on 06/27/2019 due to concerns of generalized weakness and urinating on himself with work-up unremarkable.  Per ED note, differential diagnosis possible unwitnessed seizure with incontinence and postictal lethargy but no medication changes.  Recommended to follow-up with neurology outpatient.  Prior history of seizures on Vimpat but was discontinued after AMS hospitalization in 02/2018  due to possible side effects.  Per discussion with caregiver, she  states he was "out of it" with increased confusion, decreased cognition and lethargy for 2 to 3 days and then resolved spontaneously.  Cognition/memory has been stable per caregiver with occasional incontinence but caregiver believes this is typically due to waiting too long to go to the bathroom.  He will occasionally need help with daily functioning such as getting dressed but for the most part independent.  He continues to ambulate without any recent falls.  She denies any worsening of his cognition or memory.  Denies any medication changes.  Currently on bupropion 150 mg daily, Celexa 40 mg daily, Klonopin 0.5 mg daily as needed for anxiety, Aricept 5 mg nightly, trazodone 50 mg nightly and risperidone 4 mg nightly.  She denies any witnessed seizure activity or seizure type symptoms.  Continues on Plavix and Lipitor for secondary stroke prevention without side effects.  Continues to follow with psychiatry regularly.  Update 05/27/2018 JM: Patient is being seen today for scheduled follow-up visit and is accompanied by group home caregiver.  Since previous appointment, he was seen at [facility] on 03/08/2018 with concerns of altered mental status.  He was seen previously on 03/01/2018 with similar complaints and was diagnosed with a UTI at that time.  UTI negative on 03/08/2018 admission.  MRI head reviewed and was negative for acute abnormality.  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  Patient was discharged back to group home in stable condition.  Patient states overall he has been doing well.  He has not had additional altered mental status episodes since most recent hospitalization.  On the hospitalization of 03/08/2018, Vimpat was discontinued for unclear reasons -possibly due to potential side effects of  increased fatigue and altered mental status.  Since thisdiscontinuation, patient has not had any seizure like activity.  He has been more active in his group home.  Continues to take Plavix and Lipitor without side effects for secondary stroke prevention.  He was seen by psychiatry who restarted him on his psychiatric medications.  Denies new or worsening stroke/TIA symptoms or seizure-like activity.  11/24/2017 visit Dr. Leonie Man :Patient is seen today for follow-up after his last visit 3 months ago. He is accompanied by his caregiver from the group home. Patient is unable to provide history. I have reviewed notes which accompany him and also I  called and spoke to a nurse at the group home where he stays. Patient apparently was visiting family during Christmas time and had a bunch of seizures. Detailed description is not available. Patient was initially started on Keppra but for some reason was switched to Vimpat. He is currently on 200 mg twice daily and apparently tolerating it well and has had no witnessed seizures in the group home. He is otherwise in good health with no neurological changes. His tolerating aspirin without any bruising or bleeding. His blood pressure seems well controlled. He has not had any recurrent stroke or TIA symptoms. Patient has not had any brain imaging or EEG study done for seizures.  Stroke admission 04/2017: Mario Liu is a 59yo male with PMH significant for depression, Down's syndrome (currently living in a group home), COPD, and tobacco use who presents with aphasia since this morning at around 7am. History obtained from group home caregivers. Patient was  in his usual state of health last night before he went to bed around 10pm. He was woken this morning and got up to get dressed. He came down to the kitchen and his caregiver noted that he was holding his underwear and a shirt in his hands, was not talking, and kept looking to his left side. No gait abnormalities. Caregivers state  that he has not complained of any chest pain, palpitations, trouble breathing, fevers, or other symptoms recently. Patient has never had a stroke before. They called EMS and he was brought to [facility]. Head CT at [facility] demonstrated hypodensity in L MCA territory, consistent with infarct. He was transferred to [facility] for further evaluation and potential intervention. Patient was not administered IV t-PA secondary to unclear time of onset. CT angiogram and perfusion were obtained which showed a small mismatch but given patient's poor baseline functioning he was not considered a candidate for endovascular treatment. MRI scan of the brain showed a large left MCA infarct with small petechial hemorrhagic transformation. 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 Lower extremity venous Dopplers were negative for DVT. Transthoracic echo showed normal ejection fraction. Bubble study was positive for small PFO/atrial septal defect. LDL cholesterol was elevated to 80 mg percent and hemoglobin A1c was 5.9. It was decided not to consider further workup with a TEE and loop recorder given his poor functional baseline he is not a candidate for PFO closure on anticoagulation. Patient was started on Lipitor and aspirin.     ROS:   Unable to perform due to cognitive impairment   PMH:  Past Medical History:  Diagnosis Date  . Acute ischemic left MCA stroke ([place])   . Altered mental status 03/09/2018  . Baker's cyst of knee, left 2018  . Bilateral renal cysts   . COPD (chronic obstructive pulmonary disease) ([place])   . DDD (degenerative disc disease), lumbar   . Down's syndrome   . GERD (gastroesophageal reflux disease)   . Hepatitis C   . Hepatitis C antibody test positive 05/16/2017  . Hx of cholecystectomy   . Hypothyroidism   . Kidney cysts   . Lumbar spondylosis   . Major depressive disorder    Was treated at facility in [place] [facility] years ago  . PFO with atrial septal aneurysm   . PONV (postoperative nausea  and vomiting)   . Recurrent falls 03/01/2018  . Right kidney mass   . Seizure disorder ([place]) 11/24/2017  . Thrombocytopenia ([place])   . Tobacco abuse   . Vitamin D deficiency     Social History:  Social History   Socioeconomic History  . Marital status: Single    Spouse name: Not on file  . Number of children: Not on file  . Years of education: Not on file  . Highest education level: Not on file  Occupational History  . Not on file  Tobacco Use  . Smoking status: Current Some Day Smoker    Packs/day: 0.50    Years: 39.00    Pack years: 19.50    Types: Cigarettes  . Smokeless tobacco: Never Used  . Tobacco comment: pt is still smoking  Vaping Use  . Vaping Use: Never used  Substance and Sexual Activity  . Alcohol use: Not Currently  . Drug use: No  . Sexual activity: Not on file  Other Topics Concern  . Not on file  Social History Narrative  . Not on file   Social Determinants of Health   Financial  Resource Strain:   . Difficulty of Paying Living Expenses: Not on file  Food Insecurity:   . Worried About Charity fundraiser in the Last Year: Not on file  . Ran Out of Food in the Last Year: Not on file  Transportation Needs:   . Lack of Transportation (Medical): Not on file  . Lack of Transportation (Non-Medical): Not on file  Physical Activity:   . Days of Exercise per Week: Not on file  . Minutes of Exercise per Session: Not on file  Stress:   . Feeling of Stress : Not on file  Social Connections:   . Frequency of Communication with Friends and Family: Not on file  . Frequency of Social Gatherings with Friends and Family: Not on file  . Attends Religious Services: Not on file  . Active Member of Clubs or Organizations: Not on file  . Attends Archivist Meetings: Not on file  . Marital Status: Not on file  Intimate Partner Violence:   . Fear of Current or Ex-Partner: Not on file  . Emotionally Abused: Not on file  . Physically Abused: Not on file    . Sexually Abused: Not on file    Medications:   Current Outpatient Medications on File Prior to Visit  Medication Sig Dispense Refill  . acetaminophen (TYLENOL) 325 MG tablet Take 650 mg by mouth every 6 (six) hours as needed.    Marland Kitchen albuterol (PROVENTIL HFA;VENTOLIN HFA) 108 (90 Base) MCG/ACT inhaler Inhale 1-2 puffs into the lungs every 4 (four) hours as needed for wheezing or shortness of breath.     Marland Kitchen alendronate (FOSAMAX) 70 MG tablet Take 70 mg by mouth once a week. Fridays    . aspirin 81 MG EC tablet     . atorvastatin (LIPITOR) 40 MG tablet Take 40 mg by mouth at bedtime.     . benztropine (COGENTIN) 1 MG tablet Take 1 mg by mouth at bedtime.    Marland Kitchen buPROPion (WELLBUTRIN XL) 150 MG 24 hr tablet Take 150 mg by mouth daily.     . Calcium Carb-Cholecalciferol (CALCIUM 600-D PO) Take by mouth 2 (two) times daily.    . cholecalciferol (VITAMIN D3) 25 MCG (1000 UT) tablet Take 1,000 Units by mouth daily. (Patient not taking: Reported on 03/29/2020)    . citalopram (CELEXA) 40 MG tablet Take 40 mg by mouth daily.    . clonazePAM (KLONOPIN) 0.5 MG tablet Take 0.5 mg by mouth daily as needed for anxiety (at bedtime). (Patient not taking: Reported on 03/29/2020)    . clopidogrel (PLAVIX) 75 MG tablet Take 75 mg daily by mouth.    . diphenhydrAMINE HCl (BENADRYL ALLERGY PO) Take by mouth as needed. (Patient not taking: Reported on 03/29/2020)    . donepezil (ARICEPT) 5 MG tablet Take 5 mg by mouth at bedtime.    . fluticasone (FLONASE) 50 MCG/ACT nasal spray Place 1 spray into both nostrils daily.    . food thickener (THICK IT) POWD Take by mouth as needed.    . Glycopyrrolate-Formoterol (BEVESPI AEROSPHERE) 9-4.8 MCG/ACT AERO Inhale 2 puffs into the lungs 2 (two) times daily.    Marland Kitchen levETIRAcetam (KEPPRA) 500 MG tablet  (Patient not taking: Reported on 03/29/2020)    . Loperamide HCl (IMODIUM PO) Take by mouth as needed. (Patient not taking: Reported on 03/29/2020)    . loratadine (CLARITIN) 10 MG  tablet Take 10 mg by mouth daily.    . magnesium hydroxide (MILK OF MAGNESIA) 400  MG/5ML suspension Take by mouth daily as needed for mild constipation.    . Nutritional Supplements (BOOST GLUCOSE CONTROL) LIQD Take by mouth 2 (two) times daily.    . risperidone (RISPERDAL) 4 MG tablet Take 4 mg by mouth at bedtime.    . tamsulosin (FLOMAX) 0.4 MG CAPS capsule Take 0.4 mg by mouth.    . traZODone (DESYREL) 50 MG tablet Take 1 tablet (50 mg total) by mouth at bedtime.     Current Facility-Administered Medications on File Prior to Visit  Medication Dose Route Frequency Provider Last Rate Last Admin  . lacosamide (VIMPAT) tablet 200 mg  200 mg Oral BID Garvin Fila, MD        Allergies:  No Known Allergies     Vitals There were no vitals filed for this visit. There is no height or weight on file to calculate BMI.   Physical exam General: well developed, well nourished, pleasant middle-aged African-American male, seated, in no evident distress Head: head normocephalic and atraumatic.   Neck: supple with no carotid or supraclavicular bruits Cardiovascular: regular rate and rhythm, no murmurs Musculoskeletal: no deformity Skin:  no rash/petichiae Vascular:  Normal pulses all extremities   Neurologic Exam Mental Status: Awake and fully alert. Moderate dysarthria. DIsoriented to place and time but oriented to self. Attention span, concentration and fund of knowledge poor -history provided by group home caregiver.  Follow simple step commands.  Mood and affect appropriate.  Cranial Nerves: Pupils equal, briskly reactive to light. Extraocular movements full without nystagmus. Visual fields full to confrontation. Hearing intact. Facial sensation intact. Face, tongue, palate moves normally and symmetrically.  Motor: Normal bulk and tone. Normal strength in all tested extremity muscles. Sensory.: intact to touch , pinprick , position and vibratory sensation.  Coordination: Rapid alternating  movements normal in all extremities. Finger-to-nose and heel-to-shin performed accurately bilaterally. Gait and Station: Arises from chair without difficulty. Stance is normal. Gait demonstrates normal stride length and balance Reflexes: 1+ and symmetric. Toes downgoing.         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  He has had multiple ED evaluations and hospitalizations due to transient altered mental status and transient weakness secondary to unknown etiology. Repeat MRI unremarkable.  AED discontinued during 02/2018 admission for unclear reasons but has not had any witnessed seizure activity.  Transient altered mental status episodes have subsided after changing group homes with significant improvement of overall mood and functioning   -Prior fluctuation of mental status high suspicion for behavioral related with underlying psych component.  No indication or need to restart AED as no recurrent seizure activity or symptoms -Continue clopidogrel 75 mg daily  and Lipitor for secondary stroke prevention -F/u with PCP regarding your HLD and HTN management -Advised to continue to stay active and maintain a healthy diet -continue to monitor BP at home -Maintain strict control of hypertension with blood pressure goal below 130/90, diabetes with hemoglobin A1c goal below 6.5% and cholesterol with LDL cholesterol (bad cholesterol) goal below 70 mg/dL. I also advised the patient to eat a healthy diet with plenty of whole grains, cereals, fruits and vegetables, exercise regularly and maintain ideal body weight.   Follow-up in 6 months or call earlier if needed  I spent 23 minutes of face-to-face and non-face-to-face time with patient and caregiver.  This included previsit chart review, lab review,  study review, order entry,  electronic health record documentation, patient education and discussion regarding history of stroke, history of seizures, importance of managing stroke risk factors and answered all questions to patient and caregiver satisfaction   Frann Rider, AGNP-BC  [facility] [address] [place], [place] [address]  Phone [phone] Fax [phone] Note: This document was prepared with digital dictation and possible smart phrase technology. Any transcriptional errors that result from this process are unintentional.

## 2020-08-13 ENCOUNTER — Ambulatory Visit: Payer: Medicare Other | Admitting: Adult Health

## 2020-08-13 ENCOUNTER — Encounter: Payer: Self-pay | Admitting: Adult Health

## 2020-08-13 NOTE — Progress Notes (Deleted)
[facility] [address] [place]. [place] [address] [phone]       OFFICE FOLLOW-UP NOTE  [name] Date of Birth:  1960/10/09 Medical Record Number:  [id number]    Reason for visit: Stroke [facility] provider: Dr. Leonie Man    Chief complaint: No chief complaint on file.     HPI:  Today, 08/13/2020, Mario Liu returns for stroke and seizure follow-up accompanied by group home caregiver.  Stable since prior visit in 12/2019 without new or worsening stroke/TIA symptoms, seizure activity (w/o AED use) or transient altered mental status episodes.  Remains on Plavix and atorvastatin for secondary stroke prevention without side effects.  Blood pressure today ***.  No concerns at this time.      History provided for reference purposes only Update 01/09/2020 JM: Mario Liu returns for follow-up accompanied by group home caregiver.  He has been stable from a neurological standpoint with residual stroke deficits of dysarthria which has been stable; denies new or worsening stroke/TIA symptoms.  Previously on Vimpat for seizure management but discontinued during admission for AMS in 02/2018 without recurrent seizure activity.  Multiple prior episodes some with ED evaluations of altered mental status with transient confusion, worsening cognition and lethargy with repeat EEG unremarkable.  Caregiver reports moving patient to a different group home approximately 3 months ago in hopes of behavioral changes/improvement which significantly improved overall mood and has not had any reoccurring altered mental status changes or concerns since that time.  Continues on clopidogrel and atorvastatin for secondary stroke prevention.  Blood pressure today 113/75.  No concerns at this time.  Update 10/10/2019 JM: Mario Liu is a 59 year old male who is being seen today for 58-month follow-up regarding history of stroke, seizures and transient altered mental status accompanied by group home  transporter/caregiver.  Residual stroke deficits of dysarthria and reported dysphagia has been stable.  Due to concerns with episodes of altered mental status EEG and MRI w/wo ordered.  MRI w/wo on 08/02/2019 showed chronic left parietal and left temporal ischemic infarction but no acute abnormalities.  EEG has not been completed at this time due to difficulty contacting group home.  Caregiver does report having an episode last week of having difficulty with ambulation and standing and had to use wheelchair to bring patient in to see his PCP.  Per caregiver, questionable UTI and was started on antibiotics and increase hydration.  Cognitive impairment has been stable without worsening.  He continues to follow with Dr. Rosine Door psychiatry with ongoing use of multiple psychiatric medications.  Continues on Plavix and atorvastatin for secondary stroke prevention without side effects.  Blood pressure today 122/72.  No further concerns at this time.  Update via virtual visit 07/11/2019 JM: Mario Liu is a 59 year old male who is being seen today for follow-up.  He is accompanied by his group home caregiver, Rise Paganini.  Since prior visit, he has been transition to a different group home in 09/2018.  Since prior visit, he has had multiple ED admissions for generalized weakness and AMS with all work-up unremarkable.  Most recent ED admission on 06/27/2019 due to concerns of generalized weakness and urinating on himself with work-up unremarkable.  Per ED note, differential diagnosis possible unwitnessed seizure with incontinence and postictal lethargy but no medication changes.  Recommended to follow-up with neurology outpatient.  Prior history of seizures on Vimpat but was discontinued after AMS hospitalization in 02/2018 due to possible side effects.  Per discussion with caregiver, she states he was "out of  it" with increased confusion, decreased cognition and lethargy for 2 to 3 days and then resolved spontaneously.   Cognition/memory has been stable per caregiver with occasional incontinence but caregiver believes this is typically due to waiting too long to go to the bathroom.  He will occasionally need help with daily functioning such as getting dressed but for the most part independent.  He continues to ambulate without any recent falls.  She denies any worsening of his cognition or memory.  Denies any medication changes.  Currently on bupropion 150 mg daily, Celexa 40 mg daily, Klonopin 0.5 mg daily as needed for anxiety, Aricept 5 mg nightly, trazodone 50 mg nightly and risperidone 4 mg nightly.  She denies any witnessed seizure activity or seizure type symptoms.  Continues on Plavix and Lipitor for secondary stroke prevention without side effects.  Continues to follow with psychiatry regularly.  Update 05/27/2018 JM: Patient is being seen today for scheduled follow-up visit and is accompanied by group home caregiver.  Since previous appointment, he was seen at [facility] on 03/08/2018 with concerns of altered mental status.  He was seen previously on 03/01/2018 with similar complaints and was diagnosed with a UTI at that time.  UTI negative on 03/08/2018 admission.  MRI head reviewed and was negative for acute abnormality.  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  Patient was discharged back to group home in stable condition.  Patient states overall he has been doing well.  He has not had additional altered mental status episodes since most recent hospitalization.  On the hospitalization of 03/08/2018, Vimpat was discontinued for unclear reasons -possibly due to potential side effects of increased fatigue and altered mental status.  Since thisdiscontinuation, patient has not had any seizure like activity.  He has been more  active in his group home.  Continues to take Plavix and Lipitor without side effects for secondary stroke prevention.  He was seen by psychiatry who restarted him on his psychiatric medications.  Denies new or worsening stroke/TIA symptoms or seizure-like activity.  11/24/2017 visit Dr. Leonie Man :Patient is seen today for follow-up after his last visit 3 months ago. He is accompanied by his caregiver from the group home. Patient is unable to provide history. I have reviewed notes which accompany him and also I  called and spoke to a nurse at the group home where he stays. Patient apparently was visiting family during Christmas time and had a bunch of seizures. Detailed description is not available. Patient was initially started on Keppra but for some reason was switched to Vimpat. He is currently on 200 mg twice daily and apparently tolerating it well and has had no witnessed seizures in the group home. He is otherwise in good health with no neurological changes. His tolerating aspirin without any bruising or bleeding. His blood pressure seems well controlled. He has not had any recurrent stroke or TIA symptoms. Patient has not had any brain imaging or EEG study done for seizures.  Stroke admission 04/2017: Mario Liu is a 59yo male with PMH significant for depression, Down's syndrome (currently living in a group home), COPD, and tobacco use who presents with aphasia since this morning at around 7am. History obtained from group home caregivers. Patient was in his usual state of health last night before he went to bed around 10pm. He  was woken this morning and got up to get dressed. He came down to the kitchen and his caregiver noted that he was holding his underwear and a shirt in his hands, was not talking, and kept looking to his left side. No gait abnormalities. Caregivers state that he has not complained of any chest pain, palpitations, trouble breathing, fevers, or other symptoms recently. Patient has never had a  stroke before. They called EMS and he was brought to [facility]. Head CT at [facility] demonstrated hypodensity in L MCA territory, consistent with infarct. He was transferred to [facility] for further evaluation and potential intervention. Patient was not administered IV t-PA secondary to unclear time of onset. CT angiogram and perfusion were obtained which showed a small mismatch but given patient's poor baseline functioning he was not considered a candidate for endovascular treatment. MRI scan of the brain showed a large left MCA infarct with small petechial hemorrhagic transformation. 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 Lower extremity venous Dopplers were negative for DVT. Transthoracic echo showed normal ejection fraction. Bubble study was positive for small PFO/atrial septal defect. LDL cholesterol was elevated to 80 mg percent and hemoglobin A1c was 5.9. It was decided not to consider further workup with a TEE and loop recorder given his poor functional baseline he is not a candidate for PFO closure on anticoagulation. Patient was started on Lipitor and aspirin.     ROS:   14 system review of systems is positive for see HPI and all other systems negative   PMH:  Past Medical History:  Diagnosis Date  . Acute ischemic left MCA stroke ([place])   . Altered mental status 03/09/2018  . Baker's cyst of knee, left 2018  . Bilateral renal cysts   . COPD (chronic obstructive pulmonary disease) ([place])   . DDD (degenerative disc disease), lumbar   . Down's syndrome   . GERD (gastroesophageal reflux disease)   . Hepatitis C   . Hepatitis C antibody test positive 05/16/2017  . Hx of cholecystectomy   . Hypothyroidism   . Kidney cysts   . Lumbar spondylosis   . Major depressive disorder    Was treated at facility in [place] ([facility]) years ago  . PFO with atrial septal aneurysm   . PONV (postoperative nausea and vomiting)   . Recurrent falls 03/01/2018  . Right kidney mass   . Seizure disorder ([place])  11/24/2017  . Thrombocytopenia ([place])   . Tobacco abuse   . Vitamin D deficiency     Social History:  Social History   Socioeconomic History  . Marital status: Single    Spouse name: Not on file  . Number of children: Not on file  . Years of education: Not on file  . Highest education level: Not on file  Occupational History  . Not on file  Tobacco Use  . Smoking status: Current Some Day Smoker    Packs/day: 0.50    Years: 39.00    Pack years: 19.50    Types: Cigarettes  . Smokeless tobacco: Never Used  . Tobacco comment: pt is still smoking  Vaping Use  . Vaping Use: Never used  Substance and Sexual Activity  . Alcohol use: Not Currently  . Drug use: No  . Sexual activity: Not on file  Other Topics Concern  . Not on file  Social History Narrative  . Not on file   Social Determinants of Health   Financial Resource Strain:   . Difficulty of Paying  Living Expenses: Not on file  Food Insecurity:   . Worried About Charity fundraiser in the Last Year: Not on file  . Ran Out of Food in the Last Year: Not on file  Transportation Needs:   . Lack of Transportation (Medical): Not on file  . Lack of Transportation (Non-Medical): Not on file  Physical Activity:   . Days of Exercise per Week: Not on file  . Minutes of Exercise per Session: Not on file  Stress:   . Feeling of Stress : Not on file  Social Connections:   . Frequency of Communication with Friends and Family: Not on file  . Frequency of Social Gatherings with Friends and Family: Not on file  . Attends Religious Services: Not on file  . Active Member of Clubs or Organizations: Not on file  . Attends Archivist Meetings: Not on file  . Marital Status: Not on file  Intimate Partner Violence:   . Fear of Current or Ex-Partner: Not on file  . Emotionally Abused: Not on file  . Physically Abused: Not on file  . Sexually Abused: Not on file    Medications:   Current Outpatient Medications on File  Prior to Visit  Medication Sig Dispense Refill  . acetaminophen (TYLENOL) 325 MG tablet Take 650 mg by mouth every 6 (six) hours as needed.    Marland Kitchen albuterol (PROVENTIL HFA;VENTOLIN HFA) 108 (90 Base) MCG/ACT inhaler Inhale 1-2 puffs into the lungs every 4 (four) hours as needed for wheezing or shortness of breath.     Marland Kitchen alendronate (FOSAMAX) 70 MG tablet Take 70 mg by mouth once a week. Fridays    . aspirin 81 MG EC tablet     . atorvastatin (LIPITOR) 40 MG tablet Take 40 mg by mouth at bedtime.     . benztropine (COGENTIN) 1 MG tablet Take 1 mg by mouth at bedtime.    Marland Kitchen buPROPion (WELLBUTRIN XL) 150 MG 24 hr tablet Take 150 mg by mouth daily.     . Calcium Carb-Cholecalciferol (CALCIUM 600-D PO) Take by mouth 2 (two) times daily.    . cholecalciferol (VITAMIN D3) 25 MCG (1000 UT) tablet Take 1,000 Units by mouth daily. (Patient not taking: Reported on 03/29/2020)    . citalopram (CELEXA) 40 MG tablet Take 40 mg by mouth daily.    . clonazePAM (KLONOPIN) 0.5 MG tablet Take 0.5 mg by mouth daily as needed for anxiety (at bedtime). (Patient not taking: Reported on 03/29/2020)    . clopidogrel (PLAVIX) 75 MG tablet Take 75 mg daily by mouth.    . diphenhydrAMINE HCl (BENADRYL ALLERGY PO) Take by mouth as needed. (Patient not taking: Reported on 03/29/2020)    . donepezil (ARICEPT) 5 MG tablet Take 5 mg by mouth at bedtime.    . fluticasone (FLONASE) 50 MCG/ACT nasal spray Place 1 spray into both nostrils daily.    . food thickener (THICK IT) POWD Take by mouth as needed.    . Glycopyrrolate-Formoterol (BEVESPI AEROSPHERE) 9-4.8 MCG/ACT AERO Inhale 2 puffs into the lungs 2 (two) times daily.    Marland Kitchen levETIRAcetam (KEPPRA) 500 MG tablet  (Patient not taking: Reported on 03/29/2020)    . Loperamide HCl (IMODIUM PO) Take by mouth as needed. (Patient not taking: Reported on 03/29/2020)    . loratadine (CLARITIN) 10 MG tablet Take 10 mg by mouth daily.    . magnesium hydroxide (MILK OF MAGNESIA) 400 MG/5ML  suspension Take by mouth daily as needed for  mild constipation.    . Nutritional Supplements (BOOST GLUCOSE CONTROL) LIQD Take by mouth 2 (two) times daily.    . risperidone (RISPERDAL) 4 MG tablet Take 4 mg by mouth at bedtime.    . tamsulosin (FLOMAX) 0.4 MG CAPS capsule Take 0.4 mg by mouth.    . traZODone (DESYREL) 50 MG tablet Take 1 tablet (50 mg total) by mouth at bedtime.     Current Facility-Administered Medications on File Prior to Visit  Medication Dose Route Frequency Provider Last Rate Last Admin  . lacosamide (VIMPAT) tablet 200 mg  200 mg Oral BID Garvin Fila, MD        Allergies:  No Known Allergies     Vitals There were no vitals filed for this visit. There is no height or weight on file to calculate BMI.   Physical exam General: well developed, well nourished, pleasant middle-aged African-American male, seated, in no evident distress Head: head normocephalic and atraumatic.   Neck: supple with no carotid or supraclavicular bruits Cardiovascular: regular rate and rhythm, no murmurs Musculoskeletal: no deformity Skin:  no rash/petichiae Vascular:  Normal pulses all extremities   Neurologic Exam Mental Status: Awake and fully alert. Moderate dysarthria. DIsoriented to place and time but oriented to self. Attention span, concentration and fund of knowledge poor -history provided by group home caregiver.  Follow simple step commands.  Mood and affect appropriate.  Cranial Nerves: Pupils equal, briskly reactive to light. Extraocular movements full without nystagmus. Visual fields full to confrontation. Hearing intact. Facial sensation intact. Face, tongue, palate moves normally and symmetrically.  Motor: Normal bulk and tone. Normal strength in all tested extremity muscles. Sensory.: intact to touch , pinprick , position and vibratory sensation.  Coordination: Rapid alternating movements normal in all extremities. Finger-to-nose and heel-to-shin performed accurately  bilaterally. Gait and Station: Arises from chair without difficulty. Stance is normal. Gait demonstrates normal stride length and balance Reflexes: 1+ and symmetric. Toes downgoing.         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  He has had multiple ED evaluations and hospitalizations due to transient altered mental status and transient weakness secondary to unknown etiology. Repeat MRI unremarkable.  AED discontinued during 02/2018 admission for unclear reasons but has not had any witnessed seizure activity.  Transient altered mental status episodes have subsided after changing group homes with significant improvement of overall mood and functioning     1. Hx L MCA stroke : Residual deficit: Cognitive impairment and dysarthria -stable. Continue clopidogrel 75 mg daily  and atorvastatin for secondary stroke prevention. Close PCP f/u for aggressive stroke risk factor management with HTN with BP goal <130/90, and HLD with LDL goal <70.  2. Seizures: No recurrence without AD use     Follow-up in 6 months or call earlier if needed  I spent 23 minutes of face-to-face and non-face-to-face time with patient and caregiver.  This included previsit chart review, lab review, study review, order entry, electronic health record documentation, patient education   Frann Rider, [place]  [facility] [address] [place], [place] [address]  Phone [phone] Fax [phone] Note: This document was prepared with digital dictation and possible smart phrase technology. Any transcriptional errors that result from this process are unintentional.

## 2020-08-28 ENCOUNTER — Other Ambulatory Visit: Payer: Self-pay

## 2020-08-28 ENCOUNTER — Encounter: Payer: Self-pay | Admitting: Urology

## 2020-08-28 ENCOUNTER — Ambulatory Visit (INDEPENDENT_AMBULATORY_CARE_PROVIDER_SITE_OTHER): Payer: Medicare Other | Admitting: Urology

## 2020-08-28 VITALS — BP 116/74 | HR 92 | Wt 208.0 lb

## 2020-08-28 DIAGNOSIS — C641 Malignant neoplasm of right kidney, except renal pelvis: Secondary | ICD-10-CM | POA: Diagnosis not present

## 2020-08-28 DIAGNOSIS — N2889 Other specified disorders of kidney and ureter: Secondary | ICD-10-CM

## 2020-08-28 DIAGNOSIS — N1339 Other hydronephrosis: Secondary | ICD-10-CM

## 2020-08-28 DIAGNOSIS — R569 Unspecified convulsions: Secondary | ICD-10-CM | POA: Diagnosis not present

## 2020-08-28 DIAGNOSIS — I639 Cerebral infarction, unspecified: Secondary | ICD-10-CM

## 2020-08-28 DIAGNOSIS — N390 Urinary tract infection, site not specified: Secondary | ICD-10-CM | POA: Diagnosis not present

## 2020-08-28 DIAGNOSIS — N281 Cyst of kidney, acquired: Secondary | ICD-10-CM | POA: Diagnosis not present

## 2020-08-28 LAB — BLADDER SCAN AMB NON-IMAGING: Scan Result: 47

## 2020-08-28 NOTE — Progress Notes (Signed)
08/28/2020 2:07 PM   [name] 1960-09-30 [id number]  Referring provider: [name], [place],  [place] [address]  Chief Complaint  Patient presents with  . Follow-up    HPI: Complex 59 year old male with history of Down syndrome who returns today to the office in person.  He is accompanied by a caretaker from his facility.  MRI Abd w/wo contrast on 09/28/2018 found the following. A 3.3 x 2.8 cm enhancing mass of the right kidney upper pole that was relatively occult on prior imaging workups due to lack of contrast. 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 A 6.5 x 5.2 cm Bosniac category 22F cystic right renal hilum lesion extending into the lower pole with several internal septation, known calcification, larger than previously noted. Some calyceal dilation along upper margin. No definite nodularity.   He underwent cryoablation with bx on 11/24/18.   MRI of Abd w and w/o contrast indicates the Bosniak 2 F represents interpolar and lower pole calices with dilation adjacent to a cortical cyst and a renal sinus cysts. No evidence of recurrent neoplasm.   Referred back to [place] due to concern of complex cyst in right lower pole and is in fact a chronic obstructive lower pole collecting system that is progressive.   NM Renal Imaging Flow w/Pharm from 01/30/20 revealed normal left renogram. RIGHT hydronephrosis with poor clearance of tracer prior to Lasix administration. Minimal Lasix response is identified; however, patient prematurely terminated the procedure and insufficient imaging was performed following Lasix to determine adequacy of urinary outflow from the RIGHT kidney.  After lengthy discussion, we elected conservative management.  He denies any flank pain or urinary issues.  No recent labs.  His urinalysis does appear to be grossly positive today but did not leave [place] a sample adequate for culture.  His caretaker  today reports that he was treated for UTI back in August.  This was with Macrobid.  PMH: Past Medical History:  Diagnosis Date  . Acute ischemic left MCA stroke ([place])   . Altered mental status 03/09/2018  . Baker's cyst of knee, left 2018  . Bilateral renal cysts   . COPD (chronic obstructive pulmonary disease) ([place])   . DDD (degenerative disc disease), lumbar   . Down's syndrome   . GERD (gastroesophageal reflux disease)   . Hepatitis C   . Hepatitis C antibody test positive 05/16/2017  . Hx of cholecystectomy   . Hypothyroidism   . Kidney cysts   . Lumbar spondylosis   . Major depressive disorder    Was treated at facility in [place] [facility] years ago  . PFO with atrial septal aneurysm   . PONV (postoperative nausea and vomiting)   . Recurrent falls 03/01/2018  . Right kidney mass   . Seizure disorder ([place]) 11/24/2017  . Thrombocytopenia ([place])   . Tobacco abuse   . Vitamin D deficiency     Surgical History: Past Surgical History:  Procedure Laterality Date  . IR RADIOLOGIST EVAL & MGMT  10/13/2018  . IR RADIOLOGIST EVAL & MGMT  03/03/2019  . IR RADIOLOGIST EVAL & MGMT  11/16/2019  . LAPAROSCOPIC CHOLECYSTECTOMY    . RADIOLOGY WITH ANESTHESIA Right 11/24/2018   Procedure: CT WITH ANESTHESIA RENAL CRYOABLATION WITH BIOPSY;  Surgeon: Markus Daft, MD;  Location: [facility];  Service: Anesthesiology;  Laterality: Right;    Home Medications:  Allergies as of 08/28/2020   No Known Allergies  Medication List       Accurate as of August 28, 2020  2:07 PM. If you have any questions, ask your nurse or doctor.        acetaminophen 325 MG tablet Commonly known as: TYLENOL Take 650 mg by mouth every 6 (six) hours as needed.   albuterol 108 (90 Base) MCG/ACT inhaler Commonly known as: VENTOLIN HFA Inhale 1-2 puffs into the lungs every 4 (four) hours as needed for wheezing or shortness of breath.   alendronate 70 MG tablet Commonly known as: FOSAMAX Take 70 mg by mouth once  a week. Fridays   aspirin 81 MG EC tablet   atorvastatin 40 MG tablet Commonly known as: LIPITOR Take 40 mg by mouth at bedtime.   BENADRYL ALLERGY PO Take by mouth as needed.   benztropine 1 MG tablet Commonly known as: COGENTIN Take 1 mg by mouth at bedtime.   Boost Glucose Control Liqd Take by mouth 2 (two) times daily.   buPROPion 150 MG 24 hr tablet Commonly known as: WELLBUTRIN XL Take 150 mg by mouth daily.   CALCIUM 600-D PO Take by mouth 2 (two) times daily.   cholecalciferol 25 MCG (1000 UNIT) tablet Commonly known as: VITAMIN D3 Take 1,000 Units by mouth daily.   citalopram 40 MG tablet Commonly known as: CELEXA Take 40 mg by mouth daily.   clonazePAM 0.5 MG tablet Commonly known as: KLONOPIN Take 0.5 mg by mouth daily as needed for anxiety (at bedtime).   clopidogrel 75 MG tablet Commonly known as: PLAVIX Take 75 mg daily by mouth.   donepezil 5 MG tablet Commonly known as: ARICEPT Take 5 mg by mouth at bedtime.   fluticasone 50 MCG/ACT nasal spray Commonly known as: FLONASE Place 1 spray into both nostrils daily.   food thickener Powd Commonly known as: THICK IT Take by mouth as needed.   Glycopyrrolate-Formoterol 9-4.8 MCG/ACT Aero Inhale 2 puffs into the lungs 2 (two) times daily.   IMODIUM PO Take by mouth as needed.   levETIRAcetam 500 MG tablet Commonly known as: KEPPRA   loratadine 10 MG tablet Commonly known as: CLARITIN Take 10 mg by mouth daily.   magnesium hydroxide 400 MG/5ML suspension Commonly known as: MILK OF MAGNESIA Take by mouth daily as needed for mild constipation.   risperidone 4 MG tablet Commonly known as: RISPERDAL Take 4 mg by mouth at bedtime.   tamsulosin 0.4 MG Caps capsule Commonly known as: FLOMAX Take 0.4 mg by mouth.   traZODone 50 MG tablet Commonly known as: DESYREL Take 1 tablet (50 mg total) by mouth at bedtime.       Allergies: No Known Allergies  Family History: Family History   Adopted: Yes  Problem Relation Age of Onset  . Diabetes Maternal Grandmother   . Heart attack Maternal Aunt   . Prostate cancer Neg Hx   . Bladder Cancer Neg Hx   . Kidney cancer Neg Hx     Social History:  reports that he has been smoking cigarettes. He has a 19.50 pack-year smoking history. He has never used smokeless tobacco. He reports previous alcohol use. He reports that he does not use drugs.   Physical Exam: BP 116/74   Pulse 92   Wt 208 lb (94.3 kg)   BMI 38.04 kg/m   Constitutional:  Alert and oriented, No acute distress.  Pleasant, accompanied by caretaker today. HEENT: [place] AT, moist mucus membranes.  Trachea midline, no masses. Cardiovascular: No clubbing, cyanosis, or  edema. Skin: No rashes, bruises or suspicious lesions. Neurologic: Grossly intact, no focal deficits, moving all 4 extremities. Psychiatric: Normal mood and affect.  Laboratory Data: Lab Results  Component Value Date   WBC 4.0 03/29/2020   HGB 11.9 (L) 03/29/2020   HCT 36.1 (L) 03/29/2020   MCV 85.1 03/29/2020   PLT 119 (L) 03/29/2020    Lab Results  Component Value Date   CREATININE 1.26 (H) 03/29/2020    Lab Results  Component Value Date   HGBA1C 5.9 (H) 05/15/2017   Results for orders placed or performed in visit on 08/28/20  Microscopic Examination   Urine  Result Value Ref Range   WBC, UA >30 (A) 0 - 5 /hpf   RBC 3-10 (A) 0 - 2 /hpf   Epithelial Cells (non renal) 0-10 0 - 10 /hpf   Bacteria, UA Moderate (A) None seen/Few  Urinalysis, Complete  Result Value Ref Range   Specific Gravity, UA >1.030 (H) 1.005 - 1.030   pH, UA 7.0 5.0 - 7.5   Color, UA Yellow Yellow   Appearance Ur Cloudy (A) Clear   Leukocytes,UA 2+ (A) Negative   Protein,UA 1+ (A) Negative/Trace   Glucose, UA Negative Negative   Ketones, UA Negative Negative   RBC, UA 2+ (A) Negative   Bilirubin, UA Negative Negative   Urobilinogen, Ur 0.2 0.2 - 1.0 mg/dL   Nitrite, UA Positive (A) Negative   Microscopic  Examination See below:   Basic metabolic panel  Result Value Ref Range   Glucose 130 (H) 65 - 99 mg/dL   BUN 13 6 - 24 mg/dL   Creatinine, Ser 1.07 0.76 - 1.27 mg/dL   GFR calc non Af Amer 77 >59 mL/min/1.73   GFR calc Af Amer 89 >59 mL/min/1.73   BUN/Creatinine Ratio 12 9 - 20   Sodium 140 134 - 144 mmol/L   Potassium 4.3 3.5 - 5.2 mmol/L   Chloride 105 96 - 106 mmol/L   CO2 24 20 - 29 mmol/L   Calcium 9.4 8.7 - 10.2 mg/dL  BLADDER SCAN AMB NON-IMAGING  Result Value Ref Range   Scan Result 47 ml      Assessment & Plan:    1. Renal cell carcinoma of right kidney [facility]) S/p cryoablation in 2020, most recent cross-sectional imaging in 11/2019  Consider annual imaging, will discuss with health care proxy  - Urinalysis, Complete - Basic metabolic panel - BLADDER SCAN AMB NON-IMAGING  2. Acquired complex renal cyst As per previous lengthy discussion on 02/2020, is elected conservative management for this obstruction  Recheck creatinine today to assess overall function  Consider repeat imaging in March/2022 as above  Asymptomatic, denies any flank pain  3. Other hydronephrosis As above  4. Acute UTI Urine frequent positive today, not enough urine for culture  He denies any symptoms, caretaker has agreed to bring urine for urine culture tomorrow so that we can treat this if he becomes symptomatic   Tentatively schedule f/u march 2022 with MRI  Hollice Espy, MD  [place] [address] [place], [place] [address] [phone]

## 2020-08-29 ENCOUNTER — Other Ambulatory Visit: Payer: Self-pay | Admitting: *Deleted

## 2020-08-29 ENCOUNTER — Other Ambulatory Visit: Payer: Medicare Other

## 2020-08-29 DIAGNOSIS — C641 Malignant neoplasm of right kidney, except renal pelvis: Secondary | ICD-10-CM

## 2020-08-29 LAB — URINALYSIS, COMPLETE
Bilirubin, UA: NEGATIVE
Glucose, UA: NEGATIVE
Ketones, UA: NEGATIVE
Nitrite, UA: POSITIVE — AB
Specific Gravity, UA: >1.03 — ABNORMAL HIGH (ref 1.005–1.030)
Urobilinogen, Ur: 0.2 mg/dL (ref 0.2–1.0)
pH, UA: 7 (ref 5.0–7.5)

## 2020-08-29 LAB — BASIC METABOLIC PANEL
BUN/Creatinine Ratio: 12 (ref 9–20)
BUN: 13 mg/dL (ref 6–24)
CO2: 24 mmol/L (ref 20–29)
Calcium: 9.4 mg/dL (ref 8.7–10.2)
Chloride: 105 mmol/L (ref 96–106)
Creatinine, Ser: 1.07 mg/dL (ref 0.76–1.27)
GFR calc Af Amer: 89 mL/min/{1.73_m2} (ref >59)
GFR calc non Af Amer: 77 mL/min/{1.73_m2} (ref >59)
Glucose: 130 mg/dL — ABNORMAL HIGH (ref 65–99)
Potassium: 4.3 mmol/L (ref 3.5–5.2)
Sodium: 140 mmol/L (ref 134–144)

## 2020-08-29 LAB — MICROSCOPIC EXAMINATION: WBC, UA: >30 /hpf — AB (ref 0–5)

## 2020-08-30 ENCOUNTER — Telehealth: Payer: Self-pay | Admitting: *Deleted

## 2020-08-30 DIAGNOSIS — C641 Malignant neoplasm of right kidney, except renal pelvis: Secondary | ICD-10-CM

## 2020-08-30 NOTE — Telephone Encounter (Addendum)
[name] returned call, instructed per recommendations. Voiced understanding and in agreement. MRI ordered and follow up scheduled.     ----- Message from Hollice Espy, MD sent at 08/30/2020  9:38 AM EST ----- This patient was supposed to have his caretaker drop off a urine culture which does not appear to have been done.  Please reach out to the healthcare facility and have them bring this urine by.  Additionally, if he can reach out to h healthcare proxy and let her know that his labs including kidney function is stable.  Please let her know that I had like to repeat the MRI to monitor his ablation site from his renal cell carcinoma as well as compared to previous at the 1 year mark.  Please order the MRI of the abdomen with and without for March and have him return.    If she has questions, please let me know and I can speak with her personally.    Hollice Espy, MD

## 2020-08-30 NOTE — Telephone Encounter (Addendum)
Patient's legal guardian Mario Liu contacted, unable to speak will call back to review further.  ----- Message from Hollice Espy, MD sent at 08/30/2020  9:38 AM EST ----- This patient was supposed to have his caretaker drop off a urine culture which does not appear to have been done.  Please reach out to the healthcare facility and have them bring this urine by.  Additionally, if he can reach out to h healthcare proxy and let her know that his labs including kidney function is stable.  Please let her know that I had like to repeat the MRI to monitor his ablation site from his renal cell carcinoma as well as compared to previous at the 1 year mark.  Please order the MRI of the abdomen with and without for March and have him return.    If she has questions, please let me know and I can speak with her personally.    Hollice Espy, MD

## 2020-09-06 LAB — CULTURE, URINE COMPREHENSIVE

## 2020-09-10 ENCOUNTER — Telehealth: Payer: Self-pay | Admitting: *Deleted

## 2020-09-10 MED ORDER — AMOXICILLIN-POT CLAVULANATE 875-125 MG PO TABS: 1.0000 | ORAL_TABLET | Freq: Two times a day (BID) | ORAL | 0 refills | Status: DC

## 2020-09-10 NOTE — Telephone Encounter (Addendum)
Contacted caregiver at group home -Meriam Sprague and IT sales professional. Patient has been having more frequency and urgency. Per Marlena Clipper treat with Augmentin BID 14days. Advised and aware.  ----- Message from Carman Ching, PA-C sent at 09/10/2020  2:54 PM EST ----- Please contact his caregiver and ask if he's developed any symptoms of infection. Otherwise, will not treat. ----- Message ----- From: Sarita Bottom, CMA Sent: 09/10/2020  11:22 AM EST To: Carman Ching, PA-C   ----- Message ----- From: Nell Range Lab Results In Sent: 09/01/2020   9:36 AM EST To: Vanna Scotland, MD

## 2020-09-28 ENCOUNTER — Telehealth: Payer: Self-pay | Admitting: Oncology

## 2020-09-28 NOTE — Telephone Encounter (Signed)
09/28/2020 App[facility] moved out per Dr. Tasia Catchings due to inclement weather. Spoke w/ caregiver @ pts group home, and she confirmed the change  srw

## 2020-10-01 ENCOUNTER — Inpatient Hospital Stay: Payer: Medicare Other

## 2020-10-01 ENCOUNTER — Inpatient Hospital Stay: Payer: Medicare Other | Admitting: Oncology

## 2020-10-16 ENCOUNTER — Encounter: Payer: Self-pay | Admitting: Oncology

## 2020-10-16 ENCOUNTER — Inpatient Hospital Stay (HOSPITAL_BASED_OUTPATIENT_CLINIC_OR_DEPARTMENT_OTHER): Payer: Medicare Other | Admitting: Oncology

## 2020-10-16 ENCOUNTER — Inpatient Hospital Stay: Payer: Medicare Other | Attending: Oncology

## 2020-10-16 VITALS — BP 107/78 | HR 81 | Temp 96.2°F | Resp 18 | Wt 200.9 lb

## 2020-10-16 DIAGNOSIS — Z8249 Family history of ischemic heart disease and other diseases of the circulatory system: Secondary | ICD-10-CM | POA: Insufficient documentation

## 2020-10-16 DIAGNOSIS — E039 Hypothyroidism, unspecified: Secondary | ICD-10-CM | POA: Diagnosis not present

## 2020-10-16 DIAGNOSIS — Z833 Family history of diabetes mellitus: Secondary | ICD-10-CM | POA: Diagnosis not present

## 2020-10-16 DIAGNOSIS — G40909 Epilepsy, unspecified, not intractable, without status epilepticus: Secondary | ICD-10-CM | POA: Diagnosis not present

## 2020-10-16 DIAGNOSIS — D649 Anemia, unspecified: Secondary | ICD-10-CM

## 2020-10-16 DIAGNOSIS — F17210 Nicotine dependence, cigarettes, uncomplicated: Secondary | ICD-10-CM | POA: Insufficient documentation

## 2020-10-16 DIAGNOSIS — Z8673 Personal history of transient ischemic attack (TIA), and cerebral infarction without residual deficits: Secondary | ICD-10-CM | POA: Diagnosis not present

## 2020-10-16 DIAGNOSIS — K219 Gastro-esophageal reflux disease without esophagitis: Secondary | ICD-10-CM | POA: Diagnosis not present

## 2020-10-16 DIAGNOSIS — Q909 Down syndrome, unspecified: Secondary | ICD-10-CM | POA: Diagnosis not present

## 2020-10-16 DIAGNOSIS — Z79899 Other long term (current) drug therapy: Secondary | ICD-10-CM | POA: Insufficient documentation

## 2020-10-16 DIAGNOSIS — D696 Thrombocytopenia, unspecified: Secondary | ICD-10-CM

## 2020-10-16 DIAGNOSIS — Z7982 Long term (current) use of aspirin: Secondary | ICD-10-CM | POA: Diagnosis not present

## 2020-10-16 DIAGNOSIS — J449 Chronic obstructive pulmonary disease, unspecified: Secondary | ICD-10-CM | POA: Insufficient documentation

## 2020-10-16 LAB — CBC WITH DIFFERENTIAL/PLATELET
Abs Immature Granulocytes: 0 K/uL (ref 0.00–0.07)
Basophils Absolute: 0 K/uL (ref 0.0–0.1)
Basophils Relative: 1 %
Eosinophils Absolute: 0.1 K/uL (ref 0.0–0.5)
Eosinophils Relative: 3 %
HCT: 37 % — ABNORMAL LOW (ref 39.0–52.0)
Hemoglobin: 12.3 g/dL — ABNORMAL LOW (ref 13.0–17.0)
Immature Granulocytes: 0 %
Lymphocytes Relative: 41 %
Lymphs Abs: 1.6 K/uL (ref 0.7–4.0)
MCH: 28.8 pg (ref 26.0–34.0)
MCHC: 33.2 g/dL (ref 30.0–36.0)
MCV: 86.7 fL (ref 80.0–100.0)
Monocytes Absolute: 0.4 K/uL (ref 0.1–1.0)
Monocytes Relative: 11 %
Neutro Abs: 1.8 K/uL (ref 1.7–7.7)
Neutrophils Relative %: 44 %
Platelets: 148 K/uL — ABNORMAL LOW (ref 150–400)
RBC: 4.27 MIL/uL (ref 4.22–5.81)
RDW: 12.1 % (ref 11.5–15.5)
WBC: 4 K/uL (ref 4.0–10.5)
nRBC: 0 % (ref 0.0–0.2)

## 2020-10-16 LAB — COMPREHENSIVE METABOLIC PANEL WITH GFR
ALT: 14 U/L (ref 0–44)
AST: 22 U/L (ref 15–41)
Albumin: 3.4 g/dL — ABNORMAL LOW (ref 3.5–5.0)
Alkaline Phosphatase: 43 U/L (ref 38–126)
Anion gap: 7 (ref 5–15)
BUN: 13 mg/dL (ref 6–20)
CO2: 29 mmol/L (ref 22–32)
Calcium: 9.4 mg/dL (ref 8.9–10.3)
Chloride: 104 mmol/L (ref 98–111)
Creatinine, Ser: 1.26 mg/dL — ABNORMAL HIGH (ref 0.61–1.24)
GFR, Estimated: >60 mL/min
Glucose, Bld: 129 mg/dL — ABNORMAL HIGH (ref 70–99)
Potassium: 3.9 mmol/L (ref 3.5–5.1)
Sodium: 140 mmol/L (ref 135–145)
Total Bilirubin: 0.4 mg/dL (ref 0.3–1.2)
Total Protein: 7.2 g/dL (ref 6.5–8.1)

## 2020-10-16 NOTE — Progress Notes (Signed)
[facility] follow up Visit:  Patient Care Team: [name], FNP as PCP - General (Family Medicine) Deboraha Sprang, MD as PCP - Cardiology (Cardiology) Earlie Server, MD as Consulting Physician (Oncology)  REASON FOR VISIT Follow up for thrombocytopenia.   HISTORY OF PRESENTING ILLNESS: Mario Liu 60 y.o. male is here for evaluation of low platelet count. He has Down syndrome.  The patient lives in a group home and is accompanied by caregiver from group home. He is referred to [place] for evaluation of thrombocytopenia. He had labs on with primary care provider on December 15 2016 which showed WBC 5.3 hemoglobin 14.3 MCV 88 RDW 13.8 MPV 11.37 platelet 111 lymphocyte 46.69% absolute lymphocytes 2.5 normal is BMPnormal LFT. It was noted that hepatitis C antibody reactive. Patient was seen by [place] group for workupof her positive hepatitis C antibody Further tests including HCV RNA by PCR was negative.  Hep B core antibody negative, Hb surface antibody nonreactive,A antibody positive,Hep A IgM negative. So that patient did not really have hepatitis C, additional ultrasound and fibrocystic was canceled by GI.Marland Kitchen Patient reports feeling well, good energy level, good appetite, denies any weight loss fatigue, fever or chills. Patient is a poor historian. He told me that he used to drink liquor but haven't drink lately. In September 2018, patient had a stroke, acute ischemic left MCA stroke. His in getting physical therapy. Denies any easy bruising or bleeding. He appears to be less duct if compared to prior to the stroke. Able to have simple conversation with me.Denies any recent infection, bleeding, surgery. He has some dysphagia and speech intelligibility which slowly improving with the physical therapy and speech therapy according to the group home staff.  admitted from 07/05/2018 to 07/07/2018 due to after the mental status. He had acute cystitis urine culture grow strep   agalactiae.  Patient was treated with IV antibiotics and switched to oral Augmentin. Acute kidney injury/dehydration resolved after hydration.    INTERVAL HISTORY Patient presents to clinic for all follow-up of thrombocytopenia.  Patient has a history of chronic cognitive impairment.  Patient was accompanied by staff from his group home.  Patient denies any pain today. Per facility staff, patient has a good appetite and eats well.   Review of Systems  Unable to perform ROS: Other (Chronic cognitive impairment.)    MEDICAL HISTORY: Past Medical History:  Diagnosis Date  . Acute ischemic left MCA stroke ([place])   . Altered mental status 03/09/2018  . Baker's cyst of knee, left 2018  . Bilateral renal cysts   . COPD (chronic obstructive pulmonary disease) ([place])   . DDD (degenerative disc disease), lumbar   . Down's syndrome   . GERD (gastroesophageal reflux disease)   . Hepatitis C   . Hepatitis C antibody test positive 05/16/2017  . Hx of cholecystectomy   . Hypothyroidism   . Kidney cysts   . Lumbar spondylosis   . Major depressive disorder    Was treated at facility in [place] [facility] years ago  . PFO with atrial septal aneurysm   . PONV (postoperative nausea and vomiting)   . Recurrent falls 03/01/2018  . Right kidney mass   . Seizure disorder ([place]) 11/24/2017  . Thrombocytopenia ([place])   . Tobacco abuse   . Vitamin D deficiency     SURGICAL HISTORY: Past Surgical History:  Procedure Laterality Date  . IR RADIOLOGIST EVAL & MGMT  10/13/2018  . IR RADIOLOGIST EVAL &  MGMT  03/03/2019  . IR RADIOLOGIST EVAL & MGMT  11/16/2019  . LAPAROSCOPIC CHOLECYSTECTOMY    . RADIOLOGY WITH ANESTHESIA Right 11/24/2018   Procedure: CT WITH ANESTHESIA RENAL CRYOABLATION WITH BIOPSY;  Surgeon: Markus Daft, MD;  Location: [facility];  Service: Anesthesiology;  Laterality: Right;    SOCIAL HISTORY: Social History   Socioeconomic History  . Marital status: Single    Spouse name: Not on file   . Number of children: Not on file  . Years of education: Not on file  . Highest education level: Not on file  Occupational History  . Not on file  Tobacco Use  . Smoking status: Current Some Day Smoker    Packs/day: 0.50    Years: 39.00    Pack years: 19.50    Types: Cigarettes  . Smokeless tobacco: Never Used  . Tobacco comment: pt is still smoking  Vaping Use  . Vaping Use: Never used  Substance and Sexual Activity  . Alcohol use: Not Currently  . Drug use: No  . Sexual activity: Not on file  Other Topics Concern  . Not on file  Social History Narrative  . Not on file   Social Determinants of Health   Financial Resource Strain: Not on file  Food Insecurity: Not on file  Transportation Needs: Not on file  Physical Activity: Not on file  Stress: Not on file  Social Connections: Not on file  Intimate Partner Violence: Not on file    FAMILY HISTORY Family History  Adopted: Yes  Problem Relation Age of Onset  . Diabetes Maternal Grandmother   . Heart attack Maternal Aunt   . Prostate cancer Neg Hx   . Bladder Cancer Neg Hx   . Kidney cancer Neg Hx     ALLERGIES:  has No Known Allergies.  MEDICATIONS:  Current Outpatient Medications  Medication Sig Dispense Refill  . alendronate (FOSAMAX) 70 MG tablet Take 70 mg by mouth once a week. Fridays    . aspirin 81 MG EC tablet     . atorvastatin (LIPITOR) 40 MG tablet Take 40 mg by mouth at bedtime.     . benztropine (COGENTIN) 1 MG tablet Take 1 mg by mouth at bedtime.    Marland Kitchen buPROPion (WELLBUTRIN XL) 150 MG 24 hr tablet Take 150 mg by mouth daily.     . Calcium Carb-Cholecalciferol (CALCIUM 600-D PO) Take by mouth 2 (two) times daily.    . citalopram (CELEXA) 40 MG tablet Take 40 mg by mouth daily.    . clopidogrel (PLAVIX) 75 MG tablet Take 75 mg daily by mouth.    . donepezil (ARICEPT) 5 MG tablet Take 5 mg by mouth at bedtime.    . fluticasone (FLONASE) 50 MCG/ACT nasal spray Place 1 spray into both nostrils  daily.    . Glycopyrrolate-Formoterol 9-4.8 MCG/ACT AERO Inhale 2 puffs into the lungs 2 (two) times daily.    Marland Kitchen loratadine (CLARITIN) 10 MG tablet Take 10 mg by mouth daily.    . risperidone (RISPERDAL) 4 MG tablet Take 4 mg by mouth at bedtime.    . tamsulosin (FLOMAX) 0.4 MG CAPS capsule Take 0.4 mg by mouth.    . traZODone (DESYREL) 50 MG tablet Take 1 tablet (50 mg total) by mouth at bedtime.    Marland Kitchen acetaminophen (TYLENOL) 325 MG tablet Take 650 mg by mouth every 6 (six) hours as needed. (Patient not taking: Reported on 10/16/2020)    . albuterol (PROVENTIL HFA;VENTOLIN HFA) 108 (90  Base) MCG/ACT inhaler Inhale 1-2 puffs into the lungs every 4 (four) hours as needed for wheezing or shortness of breath.  (Patient not taking: Reported on 10/16/2020)    . amoxicillin-clavulanate (AUGMENTIN) 875-125 MG tablet Take 1 tablet by mouth every 12 (twelve) hours. (Patient not taking: Reported on 10/16/2020) 14 tablet 0  . cholecalciferol (VITAMIN D3) 25 MCG (1000 UT) tablet Take 1,000 Units by mouth daily. (Patient not taking: Reported on 10/16/2020)    . clonazePAM (KLONOPIN) 0.5 MG tablet Take 0.5 mg by mouth daily as needed for anxiety (at bedtime). (Patient not taking: Reported on 10/16/2020)    . diphenhydrAMINE HCl (BENADRYL ALLERGY PO) Take by mouth as needed. (Patient not taking: Reported on 10/16/2020)    . food thickener (THICK IT) POWD Take by mouth as needed. (Patient not taking: Reported on 10/16/2020)    . levETIRAcetam (KEPPRA) 500 MG tablet  (Patient not taking: Reported on 10/16/2020)    . Loperamide HCl (IMODIUM PO) Take by mouth as needed. (Patient not taking: Reported on 10/16/2020)    . magnesium hydroxide (MILK OF MAGNESIA) 400 MG/5ML suspension Take by mouth daily as needed for mild constipation. (Patient not taking: Reported on 10/16/2020)    . Nutritional Supplements (BOOST GLUCOSE CONTROL) LIQD Take by mouth 2 (two) times daily. (Patient not taking: Reported on 10/16/2020)     Current  Facility-Administered Medications  Medication Dose Route Frequency Provider Last Rate Last Admin  . lacosamide (VIMPAT) tablet 200 mg  200 mg Oral BID Garvin Fila, MD        PHYSICAL EXAMINATION: ECOG PERFORMANCE STATUS: 1 - Symptomatic but completely ambulatory  Today's Vitals   10/16/20 1042  BP: 107/78  Pulse: 81  Resp: 18  Temp: (!) 96.2 F (35.7 C)  Weight: 200 lb 14.4 oz (91.1 kg)  PainSc: 0-No pain   Body mass index is 36.75 kg/m.  Physical Exam Vitals and nursing note reviewed.  Constitutional:      General: He is not in acute distress.    Appearance: He is not diaphoretic.  HENT:     Head: Normocephalic and atraumatic.     Mouth/Throat:     Pharynx: No oropharyngeal exudate.  Eyes:     General: No scleral icterus.    Pupils: Pupils are equal, round, and reactive to light.  Cardiovascular:     Rate and Rhythm: Normal rate and regular rhythm.     Heart sounds: No murmur heard.   Pulmonary:     Effort: Pulmonary effort is normal. No respiratory distress.     Breath sounds: No rales.  Chest:     Chest wall: No tenderness.  Abdominal:     General: There is no distension.     Palpations: Abdomen is soft.     Tenderness: There is no abdominal tenderness.  Musculoskeletal:        General: Normal range of motion.     Cervical back: Normal range of motion and neck supple.  Skin:    General: Skin is warm and dry.     Findings: No erythema.  Neurological:     Mental Status: He is alert and oriented to person, place, and time.     Cranial Nerves: No cranial nerve deficit.     Motor: No abnormal muscle tone.     Coordination: Coordination normal.     Comments: Follow simple commands.  At baseline mental status.  Psychiatric:        Mood and Affect: Affect normal.  LABORATORY DATA: I have personally reviewed the data as listed: He had labs on with primary care provider on December 15 2016 which showed WBC 5.3 hemoglobin 14.3 MCV 88 RDW 13.8 MPV 11.37  platelet 111 lymphocyte 46.69% absolute lymphocytes 2.5 normal is BMPnormal LFT. It was noted that hepatitis C antibody reactive. Patient was seen by [place] group for workupof her positive hepatitis C antibody Further tests including HCV RNA by PCR was negative.  Hep B core antibody negative, Hb surface antibody nonreactive,A antibody positive,Hep A IgM negative. CBC    Component Value Date/Time   WBC 4.0 10/16/2020 1005   RBC 4.27 10/16/2020 1005   HGB 12.3 (L) 10/16/2020 1005   HGB 13.6 04/07/2018 1320   HCT 37.0 (L) 10/16/2020 1005   HCT 42.6 04/07/2018 1320   PLT 148 (L) 10/16/2020 1005   PLT 121 (L) 04/07/2018 1320   MCV 86.7 10/16/2020 1005   MCV 86 04/07/2018 1320   MCH 28.8 10/16/2020 1005   MCHC 33.2 10/16/2020 1005   RDW 12.1 10/16/2020 1005   RDW 12.2 (L) 04/07/2018 1320   LYMPHSABS 1.6 10/16/2020 1005   LYMPHSABS 2.3 04/07/2018 1320   MONOABS 0.4 10/16/2020 1005   EOSABS 0.1 10/16/2020 1005   EOSABS 0.4 04/07/2018 1320   BASOSABS 0.0 10/16/2020 1005   BASOSABS 0.0 04/07/2018 1320   CMP Latest Ref Rng & Units 10/16/2020 08/28/2020 03/29/2020  Glucose 70 - 99 mg/dL 129(H) 130(H) 169(H)  BUN 6 - 20 mg/dL 13 13 15   Creatinine 0.61 - 1.24 mg/dL 1.26(H) 1.07 1.26(H)  Sodium 135 - 145 mmol/L 140 140 136  Potassium 3.5 - 5.1 mmol/L 3.9 4.3 4.1  Chloride 98 - 111 mmol/L 104 105 104  CO2 22 - 32 mmol/L 29 24 25   Calcium 8.9 - 10.3 mg/dL 9.4 9.4 9.0  Total Protein 6.5 - 8.1 g/dL 7.2 - 7.0  Total Bilirubin 0.3 - 1.2 mg/dL 0.4 - 0.5  Alkaline Phos 38 - 126 U/L 43 - 49  AST 15 - 41 U/L 22 - 25  ALT 0 - 44 U/L 14 - 22    04/21/2019.  Peripheral lab flow cytometry No monoclonal B cell population is detected. kappa:lambda ratio 1.8  There is no loss of, or aberrant expression of, the pan T cell  antigens to  suggest a neoplastic T cell process.  CD4:CD8 ratio 2.5  No circulating blasts are detected. There is no immunophenotypic  evidence  of abnormal myeloid  maturation.  Analysis of the leukocyte population shows: granulocytes 50%,  monocytes  10%, lymphocytes 40%, blasts <0.5%, B cells 8%, T cells 30%, NK cells  2%.    RADIOGRAPHIC STUDIES: I have personally reviewed the radiological images as listed and agree with the findings in the report No results found.    ASSESSMENT/PLAN 1. Thrombocytopenia ([place])   2. Normocytic anemia    # likely ITP.  Labs reviewed and discussed with patient and group home staff. Stable platelet counts.  Platelet count has improved.   No intervention needed.  Continue observation.  #Normocytic anemia, stable hemoglobin.  Monitor. Increased creatinine to 1.26.  Stable. 01/30/2020 renal scan showed right hydronephrosis with poor clearance of tracer.  Patient was seen by urology . Follow-up in clinic in 1 year. All questions were answered. The patient knows to call the clinic with any problems, questions or concerns.    Earlie Server, MD, PhD Hematology Oncology [facility] Pager- [phone] 10/16/2020

## 2020-10-16 NOTE — Progress Notes (Signed)
Patient here for follow up. Pt here with caregiver today

## 2020-10-22 ENCOUNTER — Encounter: Payer: Self-pay | Admitting: Adult Health

## 2020-10-22 ENCOUNTER — Ambulatory Visit (INDEPENDENT_AMBULATORY_CARE_PROVIDER_SITE_OTHER): Payer: Medicare Other | Admitting: Adult Health

## 2020-10-22 VITALS — BP 124/81 | HR 87 | Ht 62.0 in | Wt 203.0 lb

## 2020-10-22 DIAGNOSIS — G40909 Epilepsy, unspecified, not intractable, without status epilepticus: Secondary | ICD-10-CM

## 2020-10-22 DIAGNOSIS — Z8673 Personal history of transient ischemic attack (TIA), and cerebral infarction without residual deficits: Secondary | ICD-10-CM | POA: Diagnosis not present

## 2020-10-22 NOTE — Patient Instructions (Addendum)
keppra placed on med list in 02/2020 (unknown provider or facility) -taken off from med list as no indication or need at this time.  Please call office with any concerns of possible seizure type activity or symptoms  Continue clopidogrel 75 mg daily  and Atorvastatin  for secondary stroke prevention (Aspirin on med list-removed as no indication for continuation in addition to Plavix)  Continue to follow up with PCP regarding cholesterol and blood pressure management -Ensure routine follow-up with PCP for lab work including lipid panel (cholesterol levels) Maintain strict control of hypertension with blood pressure goal below 130/90 and cholesterol with LDL cholesterol (bad cholesterol) goal below 70 mg/dL.     Follow-up in 1 year or call earlier if needed     Thank you for coming to see [place] at [facility]. I hope we have been able to provide you high quality care today.  You may receive a patient satisfaction survey over the next few weeks. We would appreciate your feedback and comments so that we may continue to improve ourselves and the health of our patients.

## 2020-10-22 NOTE — Progress Notes (Signed)
[facility] [address] [place]. [place] [address] [phone]       OFFICE FOLLOW-UP NOTE  [name] Date of Birth:  [birth year] Medical Record Number:  [id number]    Reason for visit: Stroke [facility] provider: Dr. Leonie Man    Chief complaint: Chief Complaint  Patient presents with  . Follow-up    Tx room with [name] (caregiver from his group home). Six month follow up. No new concerns.      HPI:  Today, 10/22/2020, [name] returns for stroke follow-up after prior visit 10 months ago accompanied by group home caregiver, [name] who helps facilitate history due to cognitive impairment with history of Down syndrome. Stable from stroke standpoint without new stroke/TIA symptoms with residual dysarthria which has been stable. Ongoing compliance with Plavix and atorvastatin without side effects.  He is also on aspirin 81 mg daily although unknown reason.  Blood pressure today stable at 124/81. No reoccurring seizure activity -Keppra 500 mg daily placed on medication list 02/2020 (unable to verify provider) but Keppra previously switched to Vimpat back in 11/2017 and Vimpat completely discontinued in 05/2018 due to side effect concerns.  He has not been on any AEDs since that time nor is keppra on facility medication list -this was removed from medication list.  No concerns at this time.    History provided for reference purposes only Update 12/20/2019 JM:[name] returns for follow-up accompanied by group home caregiver.  He has been stable from a neurological standpoint with residual stroke deficits of dysarthria which has been stable; denies new or worsening stroke/TIA symptoms.  Previously on Vimpat for seizure management but discontinued during admission for AMS in 02/2018 without recurrent seizure activity.  Multiple prior episodes some with ED evaluations of altered mental status with transient confusion, worsening cognition and lethargy with repeat EEG  unremarkable.  Caregiver reports moving patient to a different group home approximately 3 months ago in hopes of behavioral changes/improvement which significantly improved overall mood and has not had any reoccurring altered mental status changes or concerns since that time.  Continues on clopidogrel and atorvastatin for secondary stroke prevention.  Blood pressure today 113/75.  No concerns at this time.  Update 10/10/2019 JM: [name] is a 60 year old male who is being seen today for 23-month follow-up regarding history of stroke, seizures and transient altered mental status accompanied by group home transporter/caregiver.  Residual stroke deficits of dysarthria and reported dysphagia has been stable.  Due to concerns with episodes of altered mental status EEG and MRI w/wo ordered.  MRI w/wo on 08/02/2019 showed chronic left parietal and left temporal ischemic infarction but no acute abnormalities.  EEG has not been completed at this time due to difficulty contacting group home.  Caregiver does report having an episode last week of having difficulty with ambulation and standing and had to use wheelchair to bring patient in to see his PCP.  Per caregiver, questionable UTI and was started on antibiotics and increase hydration.  Cognitive impairment has been stable without worsening.  He continues to follow with Dr. Rosine Door psychiatry with ongoing use of multiple psychiatric medications.  Continues on Plavix and atorvastatin for secondary stroke prevention without side effects.  Blood pressure today 122/72.  No further concerns at this time.  Update via virtual visit 07/11/2019 JM: [name] is a 60 year old male who is being seen today for follow-up.  He is accompanied by his group home caregiver, Rise Paganini.  Since prior visit, he has been transition to  a different group home in 09/2018.  Since prior visit, he has had multiple ED admissions for generalized weakness and AMS with all work-up unremarkable.  Most recent  ED admission on 06/27/2019 due to concerns of generalized weakness and urinating on himself with work-up unremarkable.  Per ED note, differential diagnosis possible unwitnessed seizure with incontinence and postictal lethargy but no medication changes.  Recommended to follow-up with neurology outpatient.  Prior history of seizures on Vimpat but was discontinued after AMS hospitalization in 02/2018 due to possible side effects.  Per discussion with caregiver, she states he was "out of it" with increased confusion, decreased cognition and lethargy for 2 to 3 days and then resolved spontaneously.  Cognition/memory has been stable per caregiver with occasional incontinence but caregiver believes this is typically due to waiting too long to go to the bathroom.  He will occasionally need help with daily functioning such as getting dressed but for the most part independent.  He continues to ambulate without any recent falls.  She denies any worsening of his cognition or memory.  Denies any medication changes.  Currently on bupropion 150 mg daily, Celexa 40 mg daily, Klonopin 0.5 mg daily as needed for anxiety, Aricept 5 mg nightly, trazodone 50 mg nightly and risperidone 4 mg nightly.  She denies any witnessed seizure activity or seizure type symptoms.  Continues on Plavix and Lipitor for secondary stroke prevention without side effects.  Continues to follow with psychiatry regularly.  Update 05/27/2018 JM: Patient is being seen today for scheduled follow-up visit and is accompanied by group home caregiver.  Since previous appointment, he was seen at [facility] on 03/08/2018 with concerns of altered mental status.  He was seen previously on 03/01/2018 with similar complaints and was diagnosed with a UTI at that time.  UTI negative on 03/08/2018 admission.  MRI head reviewed and was negative for acute abnormality.  It was recommended for patient to follow with psychiatry for possible restart of his psychiatric  medications along with possible early development of dementia due to Down syndrome which could be contributing to his altered mental status.  Patient was discharged back to group home in stable condition.  Patient states overall he has been doing well.  He has not had additional altered mental status episodes since most recent hospitalization.  On the hospitalization of 03/08/2018, Vimpat was discontinued for unclear reasons -possibly due to potential side effects of increased fatigue and altered mental status.  Since thisdiscontinuation, patient has not had any seizure like activity.  He has been more active in his group home.  Continues to take Plavix and Lipitor without side effects for secondary stroke prevention.  He was seen by psychiatry who restarted him on his psychiatric medications.  Denies new or worsening stroke/TIA symptoms or seizure-like activity.  11/24/2017 visit Dr. Leonie Man :Patient is seen today for follow-up after his last visit 3 months ago. He is accompanied by his caregiver from the group home. Patient is unable to provide history. I have reviewed notes which accompany him and also I  called and spoke to a nurse at the group home where he stays. Patient apparently was visiting family during Christmas time and had a bunch of seizures. Detailed description is not available. Patient was initially started on Keppra but for some reason was switched to Vimpat. He is currently on 200 mg twice daily and apparently tolerating it well and has had no witnessed seizures in the group home. He is otherwise in good health with  no neurological changes. His tolerating aspirin without any bruising or bleeding. His blood pressure seems well controlled. He has not had any recurrent stroke or TIA symptoms. Patient has not had any brain imaging or EEG study done for seizures.  Stroke admission 04/2017: Mr. Kropf is a 60yo male with PMH significant for depression, Down's syndrome (currently living in a group home),  COPD, and tobacco use who presents with aphasia since this morning at around 7am. History obtained from group home caregivers. Patient was in his usual state of health last night before he went to bed around 10pm. He was woken this morning and got up to get dressed. He came down to the kitchen and his caregiver noted that he was holding his underwear and a shirt in his hands, was not talking, and kept looking to his left side. No gait abnormalities. Caregivers state that he has not complained of any chest pain, palpitations, trouble breathing, fevers, or other symptoms recently. Patient has never had a stroke before. They called EMS and he was [facility]. Head CT at [facility] demonstrated hypodensity in L MCA territory, consistent with infarct. He was transferred to [facility] for further evaluation and potential intervention. Patient was not administered IV t-PA secondary to unclear time of onset. CT angiogram and perfusion were obtained which showed a small mismatch but given patient's poor baseline functioning he was not considered a candidate for endovascular treatment. MRI scan of the brain showed a large left MCA infarct with small petechial hemorrhagic transformation. 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 Lower extremity venous Dopplers were negative for DVT. Transthoracic echo showed normal ejection fraction. Bubble study was positive for small PFO/atrial septal defect. LDL cholesterol was elevated to 80 mg percent and hemoglobin A1c was 5.9. It was decided not to consider further workup with a TEE and loop recorder given his poor functional baseline he is not a candidate for PFO closure on anticoagulation. Patient was started on Lipitor and aspirin.     ROS:   14 system review of systems is positive for see HPI and all other systems negative   PMH:  Past Medical History:  Diagnosis Date  . Acute ischemic left MCA stroke ([place])   . Altered mental status 03/09/2018  . Baker's cyst of knee, left  2018  . Bilateral renal cysts   . COPD (chronic obstructive pulmonary disease) ([place])   . DDD (degenerative disc disease), lumbar   . Down's syndrome   . GERD (gastroesophageal reflux disease)   . Hepatitis C   . Hepatitis C antibody test positive 05/16/2017  . Hx of cholecystectomy   . Hypothyroidism   . Kidney cysts   . Lumbar spondylosis   . Major depressive disorder    Was treated at facility in [place] ([facility]) years ago  . PFO with atrial septal aneurysm   . PONV (postoperative nausea and vomiting)   . Recurrent falls 03/01/2018  . Right kidney mass   . Seizure disorder ([place]) 11/24/2017  . Thrombocytopenia ([place])   . Tobacco abuse   . Vitamin D deficiency     Social History:  Social History   Socioeconomic History  . Marital status: Single    Spouse name: Not on file  . Number of children: Not on file  . Years of education: Not on file  . Highest education level: Not on file  Occupational History  . Not on file  Tobacco Use  . Smoking status: Current Some Day Smoker  Packs/day: 0.50    Years: 39.00    Pack years: 19.50    Types: Cigarettes  . Smokeless tobacco: Never Used  . Tobacco comment: pt is still smoking  Vaping Use  . Vaping Use: Never used  Substance and Sexual Activity  . Alcohol use: Not Currently  . Drug use: No  . Sexual activity: Not on file  Other Topics Concern  . Not on file  Social History Narrative  . Not on file   Social Determinants of Health   Financial Resource Strain: Not on file  Food Insecurity: Not on file  Transportation Needs: Not on file  Physical Activity: Not on file  Stress: Not on file  Social Connections: Not on file  Intimate Partner Violence: Not on file    Medications:   Current Outpatient Medications on File Prior to Visit  Medication Sig Dispense Refill  . acetaminophen (TYLENOL) 325 MG tablet Take 650 mg by mouth every 6 (six) hours as needed.    Marland Kitchen albuterol (PROVENTIL HFA;VENTOLIN HFA) 108 (90 Base)  MCG/ACT inhaler Inhale 1-2 puffs into the lungs every 4 (four) hours as needed for wheezing or shortness of breath.    Marland Kitchen alendronate (FOSAMAX) 70 MG tablet Take 70 mg by mouth once a week. Fridays    . amoxicillin-clavulanate (AUGMENTIN) 875-125 MG tablet Take 1 tablet by mouth every 12 (twelve) hours. 14 tablet 0  . aspirin 81 MG EC tablet     . atorvastatin (LIPITOR) 40 MG tablet Take 40 mg by mouth at bedtime.     . benztropine (COGENTIN) 1 MG tablet Take 1 mg by mouth at bedtime.    Marland Kitchen buPROPion (WELLBUTRIN XL) 150 MG 24 hr tablet Take 150 mg by mouth daily.     . Calcium Carb-Cholecalciferol (CALCIUM 600-D PO) Take by mouth 2 (two) times daily.    . cholecalciferol (VITAMIN D3) 25 MCG (1000 UT) tablet Take 1,000 Units by mouth daily.    . citalopram (CELEXA) 40 MG tablet Take 40 mg by mouth daily.    . clonazePAM (KLONOPIN) 0.5 MG tablet Take 0.5 mg by mouth daily as needed for anxiety (at bedtime).    . clopidogrel (PLAVIX) 75 MG tablet Take 75 mg daily by mouth.    . diphenhydrAMINE HCl (BENADRYL ALLERGY PO) Take by mouth as needed.    . donepezil (ARICEPT) 5 MG tablet Take 5 mg by mouth at bedtime.    . fluticasone (FLONASE) 50 MCG/ACT nasal spray Place 1 spray into both nostrils daily.    . food thickener (THICK IT) POWD Take by mouth as needed.    . Glycopyrrolate-Formoterol 9-4.8 MCG/ACT AERO Inhale 2 puffs into the lungs 2 (two) times daily.    Marland Kitchen levETIRAcetam (KEPPRA) 500 MG tablet     . Loperamide HCl (IMODIUM PO) Take by mouth as needed.    . loratadine (CLARITIN) 10 MG tablet Take 10 mg by mouth daily.    . magnesium hydroxide (MILK OF MAGNESIA) 400 MG/5ML suspension Take by mouth daily as needed for mild constipation.    . Nutritional Supplements (BOOST GLUCOSE CONTROL) LIQD Take by mouth 2 (two) times daily.    . risperidone (RISPERDAL) 4 MG tablet Take 4 mg by mouth at bedtime.    . tamsulosin (FLOMAX) 0.4 MG CAPS capsule Take 0.4 mg by mouth.    . traZODone (DESYREL) 50 MG  tablet Take 1 tablet (50 mg total) by mouth at bedtime.     Current Facility-Administered Medications on File  Prior to Visit  Medication Dose Route Frequency Provider Last Rate Last Admin  . lacosamide (VIMPAT) tablet 200 mg  200 mg Oral BID Garvin Fila, MD        Allergies:  No Known Allergies     Vitals Today's Vitals   10/22/20 0933  BP: 124/81  Pulse: 87  Weight: 203 lb (92.1 kg)  Height: 5\' 2"  (1.575 m)   Body mass index is 37.13 kg/m.   Physical exam General: well developed, well nourished, pleasant middle-aged African-American male, seated, in no evident distress Head: head normocephalic and atraumatic.   Neck: supple with no carotid or supraclavicular bruits Cardiovascular: regular rate and rhythm, no murmurs Musculoskeletal: no deformity Skin:  no rash/petichiae Vascular:  Normal pulses all extremities   Neurologic Exam Mental Status: Awake and fully alert. Moderate dysarthria. DIsoriented to place and time but oriented to self. Attention span, concentration and fund of knowledge poor -history provided by group home caregiver.  Follow simple step commands.  Mood and affect appropriate.  Cranial Nerves: Pupils equal, briskly reactive to light. Extraocular movements full without nystagmus. Visual fields full to confrontation. Hearing intact. Facial sensation intact. Face, tongue, palate moves normally and symmetrically.  Motor: Normal bulk and tone. Normal strength in all tested extremity muscles. Sensory.: intact to touch , pinprick , position and vibratory sensation.  Coordination: Rapid alternating movements normal in all extremities. Finger-to-nose and heel-to-shin performed accurately bilaterally. Gait and Station: Arises from chair without difficulty. Stance is normal. Gait demonstrates normal stride length and balance without use of assistive device Reflexes: 1+ and symmetric. Toes downgoing.         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  He had multiple ED evaluations and hospitalizations in 2018-2019 due to transient altered mental status and transient weakness secondary to unknown etiology. Repeat MRI unremarkable.  AED discontinued during 02/2018 admission for unclear reasons (possibly consider medication side effect) but has not had any reoccurring seizure activity since that time.  Transient altered mental status episodes have subsided after changing group homes with significant improvement of overall mood and functioning   PLAN: -Continue clopidogrel 75 mg daily  and Lipitor for secondary stroke prevention -Discontinue aspirin as no clear indication for DAPT and not recommended from a stroke prevention standpoint -Advised to continue to monitor for any seizure activity and to call office with any concerns -no indication for AED at this time as he has not had any recurrent seizures since d/c'ing AED in 2019 -F/u with PCP regarding your HLD and HTN management -recent lab work by PCP and request to ensure lipid panel being monitored which was discussed with facility caregiver (unable to view labs via epic) -Discussed secondary stroke prevention measures and importance of maintaining strict control of hypertension with blood pressure goal below 130/90 and cholesterol with LDL cholesterol (bad cholesterol) goal below 70 mg/dL.     Follow-up in 1 year or call earlier if needed   CC:  [facility] provider: Dr. Lyn Hollingshead, Jordan Likes, FNP     I spent 25 minutes of face-to-face and non-face-to-face time with patient and caregiver.  This included previsit chart review, lab review, study review, order entry, electronic health record documentation, patient education and discussion regarding prior history of stroke, hx of seizures, importance of managing stroke risk factors and answered all  other questions to patient and caregiver  satisfaction   Frann Rider, AGNP-BC  [facility] [address] [place], [place] [address]  Phone [phone] Fax [phone] Note: This document was prepared with digital dictation and possible smart phrase technology. Any transcriptional errors that result from this process are unintentional.

## 2020-10-23 NOTE — Progress Notes (Signed)
I agree with the above plan 

## 2020-11-16 ENCOUNTER — Other Ambulatory Visit: Payer: Self-pay | Admitting: Diagnostic Radiology

## 2020-11-16 DIAGNOSIS — N2889 Other specified disorders of kidney and ureter: Secondary | ICD-10-CM

## 2020-12-05 ENCOUNTER — Ambulatory Visit: Payer: Medicare Other | Admitting: Urology

## 2020-12-24 ENCOUNTER — Ambulatory Visit
Admission: RE | Admit: 2020-12-24 | Discharge: 2020-12-24 | Disposition: A | Discharge status: Home / Self Care | Payer: Medicare Other | Source: Ambulatory Visit | Attending: Diagnostic Radiology | Admitting: Diagnostic Radiology

## 2020-12-24 ENCOUNTER — Other Ambulatory Visit: Payer: Self-pay

## 2020-12-24 DIAGNOSIS — C641 Malignant neoplasm of right kidney, except renal pelvis: Secondary | ICD-10-CM | POA: Insufficient documentation

## 2020-12-24 MED ORDER — GADOBUTROL 1 MMOL/ML IV SOLN
7.5000 mL | Freq: Once | INTRAVENOUS | Status: AC | PRN
  Administered 2020-12-24: 7.5 mL via INTRAVENOUS

## 2020-12-25 NOTE — Progress Notes (Signed)
12/26/2020  3:10 PM   Mario Liu 1960/12/25 [id number]  Referring provider: Remi Liu, Mario Liu,  Mario Liu [id number] Chief Complaint  Patient presents with  . Follow-up     HPI: Mario Liu is a 61 y.o. male with a history of Down syndrome who is accompanied by a caretaker from his facility, got verbal permission to follow up with him today. He presents for a 3 month follow up regarding MRI of abdomen with and without contrast results.  Patient denies any kidney pain or tenderness, burning, UTIs, or dysuria. Caretaker advised that she has not noticed him having any urinary symptoms.  Patient's legal guardian Mrs. Mario Liu was called to give update about MRI results and the patient's status and future care plan.   Impression of MRI: 1. Stable post ablation changes in the upper pole of the right  kidney without findings to suggest locally recurrent  2. Multiple Bosniak class 1 cysts and large Bosniak class 2 renal  sinus cyst in the lower pole of the right kidney, similar to prior  examination.  3. Small left adrenal adenoma.     PMH: Past Medical History:  Diagnosis Date  . Acute ischemic left MCA stroke ([place])   . Altered mental status 03/09/2018  . Baker's cyst of knee, left 2018  . Bilateral renal cysts   . COPD (chronic obstructive pulmonary disease) ([place])   . DDD (degenerative disc disease), lumbar   . Down's syndrome   . GERD (gastroesophageal reflux disease)   . Hepatitis C   . Hepatitis C antibody test positive 05/16/2017  . Hx of cholecystectomy   . Hypothyroidism   . Kidney cysts   . Lumbar spondylosis   . Major depressive disorder    Was treated at facility in [place] ([facility] years ago  . PFO with atrial septal aneurysm   . PONV (postoperative nausea and vomiting)   . Recurrent falls 03/01/2018  . Right kidney mass   . Seizure disorder ([place]) 11/24/2017  . Thrombocytopenia ([place])   . Tobacco abuse   . Vitamin D deficiency      Surgical History: Past Surgical History:  Procedure Laterality Date  . IR RADIOLOGIST EVAL & MGMT  10/13/2018  . IR RADIOLOGIST EVAL & MGMT  03/03/2019  . IR RADIOLOGIST EVAL & MGMT  11/16/2019  . LAPAROSCOPIC CHOLECYSTECTOMY    . RADIOLOGY WITH ANESTHESIA Right 11/24/2018   Procedure: CT WITH ANESTHESIA RENAL CRYOABLATION WITH BIOPSY;  Surgeon: Markus Daft, MD;  Location: [facility];  Service: Anesthesiology;  Laterality: Right;    Home Medications:  Allergies as of 12/26/2020   No Known Allergies     Medication List       Accurate as of December 26, 2020  3:10 PM. If you have any questions, ask your nurse or doctor.        STOP taking these medications   amoxicillin-clavulanate 875-125 MG tablet Commonly known as: AUGMENTIN Stopped by: Hollice Espy, MD     TAKE these medications   acetaminophen 325 MG tablet Commonly known as: TYLENOL Take 650 mg by mouth every 6 (six) hours as needed.   albuterol 108 (90 Base) MCG/ACT inhaler Commonly known as: VENTOLIN HFA Inhale 1-2 puffs into the lungs every 4 (four) hours as needed for wheezing or shortness of breath.   alendronate 70 MG tablet Commonly known as: FOSAMAX Take 70 mg by mouth once a week. Fridays   atorvastatin 40 MG tablet Commonly known as:  LIPITOR Take 40 mg by mouth at bedtime.   BENADRYL ALLERGY PO Take by mouth as needed.   benztropine 1 MG tablet Commonly known as: COGENTIN Take 1 mg by mouth at bedtime.   Boost Glucose Control Liqd Take by mouth 2 (two) times daily.   buPROPion 150 MG 24 hr tablet Commonly known as: WELLBUTRIN XL Take 150 mg by mouth daily.   CALCIUM 600-D PO Take by mouth 2 (two) times daily.   cholecalciferol 25 MCG (1000 UNIT) tablet Commonly known as: VITAMIN D3 Take 1,000 Units by mouth daily.   citalopram 40 MG tablet Commonly known as: CELEXA Take 40 mg by mouth daily.   clonazePAM 0.5 MG tablet Commonly known as: KLONOPIN Take 0.5 mg by mouth daily as needed for  anxiety (at bedtime).   clopidogrel 75 MG tablet Commonly known as: PLAVIX Take 75 mg daily by mouth.   donepezil 5 MG tablet Commonly known as: ARICEPT Take 5 mg by mouth at bedtime.   fluticasone 50 MCG/ACT nasal spray Commonly known as: FLONASE Place 1 spray into both nostrils daily.   food thickener Powd Commonly known as: THICK IT Take by mouth as needed.   Glycopyrrolate-Formoterol 9-4.8 MCG/ACT Aero Inhale 2 puffs into the lungs 2 (two) times daily.   IMODIUM PO Take by mouth as needed.   loratadine 10 MG tablet Commonly known as: CLARITIN Take 10 mg by mouth daily.   magnesium hydroxide 400 MG/5ML suspension Commonly known as: MILK OF MAGNESIA Take by mouth daily as needed for mild constipation.   risperidone 4 MG tablet Commonly known as: RISPERDAL Take 4 mg by mouth at bedtime.   tamsulosin 0.4 MG Caps capsule Commonly known as: FLOMAX Take 0.4 mg by mouth.   traZODone 50 MG tablet Commonly known as: DESYREL Take 1 tablet (50 mg total) by mouth at bedtime.       Allergies: No Known Allergies  Family History: Family History  Adopted: Yes  Problem Relation Age of Onset  . Diabetes Maternal Grandmother   . Heart attack Maternal Aunt   . Prostate cancer Neg Hx   . Bladder Cancer Neg Hx   . Kidney cancer Neg Hx     Social History:   reports that he has been smoking cigarettes. He has a 19.50 pack-year smoking history. He has never used smokeless tobacco. He reports previous alcohol use. He reports that he does not use drugs.  ROS: Pertinent ROS in HPI.  Physical Exam: BP 106/73   Pulse 88   Ht 5\' 2"  (1.575 m)   Wt 203 lb (92.1 kg)   BMI 37.13 kg/m   Constitutional:  Alert and oriented, No acute distress. HEENT: [name] AT, moist mucus membranes.  Trachea midline, no masses. Cardiovascular: No clubbing, cyanosis, or edema. Respiratory: Normal respiratory effort, no increased work of breathing. Skin: No rashes, bruises or suspicious  lesions. Neurologic: Grossly intact, no focal deficits, moving all 4 extremities. Psychiatric: Normal mood and affect.  Laboratory Data:  BMP from 08/28/2020  Component Ref Range & Units 4 mo ago  (08/28/20) 9 mo ago  (03/29/20) 1 yr ago  (09/26/19) 1 yr ago  (06/27/19) 1 yr ago  (03/28/19) 1 yr ago  (02/12/19) 2 yr ago  (11/25/18)  Glucose 65 - 99 mg/dL 130High  169High R, CM  135High R  105High R  108High R  121High R  143High R   BUN 6 - 24 mg/dL 13  15 R  17 R  16  R  19 R  24High R  19 R   Creatinine, Ser 0.76 - 1.27 mg/dL 1.07  1.26High R  1.14 R  0.96 R  1.13 R  1.65High R  1.14 R   GFR calc non Af Amer >59 mL/min/1.73 77  >60 R  >60 R  >60 R  >60 R  46Low R  >60 R   GFR calc Af Amer >59 mL/min/1.73 89  >60 R  >60 R  >60 R  >60 R  53Low R  >60 R  Pertinent Imaging:  MR Abdomen W Wo Contrast [[id number]] Resulted: 12/25/20 1008  Order Status: Completed Updated: 12/25/20 1010  Narrative:    CLINICAL DATA: 60 year old male with history of renal cell  carcinoma status post ablation in the upper pole of the right  kidney. Follow-up study.   EXAM:  MRI ABDOMEN WITHOUT AND WITH CONTRAST   TECHNIQUE:  Multiplanar multisequence MR imaging of the abdomen was performed  both before and after the administration of intravenous contrast.   CONTRAST: 7.31mL GADAVIST GADOBUTROL 1 MMOL/ML IV SOLN   COMPARISON: Abdominal MRI 11/08/2019.   FINDINGS:  Lower chest: Unremarkable.   Hepatobiliary: No suspicious cystic or solid hepatic lesions. No  intra or extrahepatic biliary ductal dilatation. Status post  cholecystectomy.   Pancreas: No pancreatic mass. No pancreatic ductal dilatation. No  pancreatic or peripancreatic fluid collections or inflammatory  changes.   Spleen: Unremarkable.   Adrenals/Urinary Tract: In the medial aspect of the upper pole of  the right kidney there is again a well-defined area which is  heterogeneous in signal intensity on T1  and T2 weighted images, but  generally T1 hypointense and T2 hypointense, without significant  internal enhancement on post gadolinium imaging, compatible with a  post ablation defect. Several other T1 hypointense, T2 hyperintense,  nonenhancing lesions are noted in both kidneys, compatible with  simple cysts, largest of which is in the lateral aspect of the  interpolar region of the right kidney measuring 3.3 cm in diameter  (axial image 22 of series 4). Prominent renal sinus cyst in the  lower pole of the right kidney also noted measuring 2.9 x 4.9 x 2.2  cm, with some thin internal septations with low levels of internal  enhancements, but no definite mural nodularity or solid component.  No hydroureteronephrosis in the visualized portions of the abdomen.  Right adrenal gland is normal in appearance. 1.3 cm left adrenal  nodule which demonstrates loss of signal intensity on out of phase  dual echo images, diagnostic of a benign adrenal adenoma.   Stomach/Bowel: Visualized portions are unremarkable.   Vascular/Lymphatic: No aneurysm identified in the visualized  abdominal vasculature. No lymphadenopathy noted in the abdomen.   Other: No significant volume of ascites noted within the visualized  portions of the peritoneal cavity.   Musculoskeletal: No aggressive appearing osseous lesions are noted  in the visualized portions of the skeleton.   IMPRESSION:  1. Stable post ablation changes in the upper pole of the right  kidney without findings to suggest locally recurrent  2. Multiple Bosniak class 1 cysts and large Bosniak class 2 renal  sinus cyst in the lower pole of the right kidney, similar to prior  examination.  3. Small left adrenal adenoma.    Electronically Signed  By: Vinnie Langton M.D.  On: 12/25/2020 10:08      I have personally reviewed the images and agree with radiologist interpretation.  No evidence of recurrent mass with stable obstruction  from sinus  cyst on the right.   Assessment & Plan:     1. Renal cell carcinoma S/p cryoablation in 2020  Will continue annual MRI surveilance, and recheck creatinine   Monitor renal function  2. Acquired complex renal cyst Previously conducted lengthy discussion, and elected a conservative management plan for obstruction  Causing some degree of obstruction, overall kidney looks stable no recurrent infections  Asymptomatic, denies any flank pain  Creatinine remained stable, asymptomatic   Previous split renal function from Lasix renogram for baseline, if his hydronephrosis progresses, his renal function worsens or he starts to have recurrent infections or pain, may consider repeating  Discussed this plan with his healthcare proxy by phone today, she is agreeable this plan   3. Hydronephrosis As above, secondary to #2   Follow Up: Follow up in a year with MRI w and wo for renal mass   I, Ardyth Gal, am acting as a scribe for Dr. Hollice Espy.   I have reviewed the above documentation for accuracy and completeness, and I agree with the above.   Hollice Espy, MD    [facility] [address] [place], [place] [address] [phone]

## 2020-12-26 ENCOUNTER — Ambulatory Visit: Payer: Medicare Other | Admitting: Urology

## 2020-12-26 ENCOUNTER — Ambulatory Visit (INDEPENDENT_AMBULATORY_CARE_PROVIDER_SITE_OTHER): Payer: Medicare Other | Admitting: Urology

## 2020-12-26 ENCOUNTER — Encounter: Payer: Self-pay | Admitting: Urology

## 2020-12-26 ENCOUNTER — Other Ambulatory Visit: Payer: Self-pay

## 2020-12-26 DIAGNOSIS — N2889 Other specified disorders of kidney and ureter: Secondary | ICD-10-CM | POA: Diagnosis not present

## 2021-01-02 ENCOUNTER — Other Ambulatory Visit: Payer: Self-pay

## 2021-01-02 ENCOUNTER — Ambulatory Visit
Admission: RE | Admit: 2021-01-02 | Discharge: 2021-01-02 | Disposition: A | Discharge status: Home / Self Care | Payer: Medicare Other | Source: Ambulatory Visit | Attending: Diagnostic Radiology | Admitting: Diagnostic Radiology

## 2021-01-02 DIAGNOSIS — N2889 Other specified disorders of kidney and ureter: Secondary | ICD-10-CM

## 2021-01-02 NOTE — Progress Notes (Signed)
Patient ID: Niccolo Burggraf, male   DOB: 10/31/1960, 60 y.o.   MRN: [id number] Spoke with patient's sister, [name], today.  She is the legal guardian for Mr. Miler.  She recently had a virtual visit with Dr. Hollice Espy in Urology.  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  MRI from 12/24/20 shows expected changes from the ablation and no evidence for recurrent disease.  Dr. Erlene Quan is planning on 1 year follow-up.  I agree with 1 year surveillance.  Due to patient's Down syndrome and social living situation, it will probably be easier to have the patient and sister just follow up with Dr. Erlene Quan and IR can follow in the periphery and be available if additional intervention is needed.

## 2021-02-22 ENCOUNTER — Ambulatory Visit
Admission: RE | Admit: 2021-02-22 | Discharge: 2021-02-22 | Disposition: A | Discharge status: Home / Self Care | Payer: Medicare Other | Source: Ambulatory Visit | Attending: Adult Health | Admitting: Adult Health

## 2021-02-22 ENCOUNTER — Other Ambulatory Visit: Payer: Self-pay

## 2021-02-22 ENCOUNTER — Ambulatory Visit
Admission: RE | Admit: 2021-02-22 | Discharge: 2021-02-22 | Disposition: A | Discharge status: Home / Self Care | Payer: Medicare Other | Attending: Internal Medicine | Admitting: Internal Medicine

## 2021-02-22 ENCOUNTER — Other Ambulatory Visit: Payer: Self-pay | Admitting: Adult Health

## 2021-02-22 DIAGNOSIS — A159 Respiratory tuberculosis unspecified: Secondary | ICD-10-CM

## 2021-04-29 ENCOUNTER — Other Ambulatory Visit: Payer: Self-pay

## 2021-04-29 ENCOUNTER — Ambulatory Visit
Admission: RE | Admit: 2021-04-29 | Discharge: 2021-04-29 | Disposition: A | Discharge status: Home / Self Care | Payer: Medicare Other | Source: Ambulatory Visit | Attending: Urology | Admitting: Urology

## 2021-04-29 DIAGNOSIS — N2889 Other specified disorders of kidney and ureter: Secondary | ICD-10-CM | POA: Insufficient documentation

## 2021-04-29 MED ORDER — GADOBUTROL 1 MMOL/ML IV SOLN
9.0000 mL | Freq: Once | INTRAVENOUS | Status: AC | PRN
  Administered 2021-04-29: 9 mL via INTRAVENOUS

## 2021-05-01 ENCOUNTER — Telehealth: Payer: Self-pay

## 2021-05-01 NOTE — Telephone Encounter (Signed)
-----   Message from Hollice Espy, MD sent at 04/30/2021 10:48 AM EDT ----- MR looks stable, great news  Hollice Espy, MD

## 2021-10-09 ENCOUNTER — Other Ambulatory Visit: Payer: Self-pay | Admitting: *Deleted

## 2021-10-09 DIAGNOSIS — D649 Anemia, unspecified: Secondary | ICD-10-CM

## 2021-10-09 DIAGNOSIS — D696 Thrombocytopenia, unspecified: Secondary | ICD-10-CM

## 2021-10-16 ENCOUNTER — Other Ambulatory Visit: Payer: Medicare Other

## 2021-10-16 ENCOUNTER — Ambulatory Visit: Payer: Medicare Other | Admitting: Oncology

## 2021-10-17 ENCOUNTER — Encounter: Payer: Self-pay | Admitting: Nurse Practitioner

## 2021-10-17 ENCOUNTER — Inpatient Hospital Stay: Payer: Medicare Other | Attending: Oncology

## 2021-10-17 ENCOUNTER — Inpatient Hospital Stay (HOSPITAL_BASED_OUTPATIENT_CLINIC_OR_DEPARTMENT_OTHER): Payer: Medicare Other | Admitting: Nurse Practitioner

## 2021-10-17 ENCOUNTER — Other Ambulatory Visit: Payer: Self-pay

## 2021-10-17 VITALS — BP 109/83 | HR 83 | Temp 96.1°F | Resp 16 | Wt 201.0 lb

## 2021-10-17 DIAGNOSIS — R7989 Other specified abnormal findings of blood chemistry: Secondary | ICD-10-CM | POA: Insufficient documentation

## 2021-10-17 DIAGNOSIS — Q909 Down syndrome, unspecified: Secondary | ICD-10-CM | POA: Diagnosis not present

## 2021-10-17 DIAGNOSIS — F17210 Nicotine dependence, cigarettes, uncomplicated: Secondary | ICD-10-CM | POA: Insufficient documentation

## 2021-10-17 DIAGNOSIS — D649 Anemia, unspecified: Secondary | ICD-10-CM

## 2021-10-17 DIAGNOSIS — D696 Thrombocytopenia, unspecified: Secondary | ICD-10-CM | POA: Diagnosis not present

## 2021-10-17 LAB — CBC WITH DIFFERENTIAL/PLATELET
Abs Immature Granulocytes: 0.01 10*3/uL (ref 0.00–0.07)
Basophils Absolute: 0 10*3/uL (ref 0.0–0.1)
Basophils Relative: 1 %
Eosinophils Absolute: 0.3 10*3/uL (ref 0.0–0.5)
Eosinophils Relative: 6 %
HCT: 44.5 % (ref 39.0–52.0)
Hemoglobin: 14.2 g/dL (ref 13.0–17.0)
Immature Granulocytes: 0 %
Lymphocytes Relative: 37 %
Lymphs Abs: 1.9 10*3/uL (ref 0.7–4.0)
MCH: 27.6 pg (ref 26.0–34.0)
MCHC: 31.9 g/dL (ref 30.0–36.0)
MCV: 86.4 fL (ref 80.0–100.0)
Monocytes Absolute: 0.4 10*3/uL (ref 0.1–1.0)
Monocytes Relative: 8 %
Neutro Abs: 2.5 10*3/uL (ref 1.7–7.7)
Neutrophils Relative %: 48 %
Platelets: 125 10*3/uL — ABNORMAL LOW (ref 150–400)
RBC: 5.15 MIL/uL (ref 4.22–5.81)
RDW: 12.4 % (ref 11.5–15.5)
WBC: 5.2 10*3/uL (ref 4.0–10.5)
nRBC: 0 % (ref 0.0–0.2)

## 2021-10-17 LAB — COMPREHENSIVE METABOLIC PANEL
ALT: 20 U/L (ref 0–44)
AST: 21 U/L (ref 15–41)
Albumin: 3.7 g/dL (ref 3.5–5.0)
Alkaline Phosphatase: 49 U/L (ref 38–126)
Anion gap: 4 — ABNORMAL LOW (ref 5–15)
BUN: 16 mg/dL (ref 6–20)
CO2: 27 mmol/L (ref 22–32)
Calcium: 8.8 mg/dL — ABNORMAL LOW (ref 8.9–10.3)
Chloride: 105 mmol/L (ref 98–111)
Creatinine, Ser: 1.37 mg/dL — ABNORMAL HIGH (ref 0.61–1.24)
GFR, Estimated: 60 mL/min — ABNORMAL LOW (ref >60)
Glucose, Bld: 112 mg/dL — ABNORMAL HIGH (ref 70–99)
Potassium: 3.7 mmol/L (ref 3.5–5.1)
Sodium: 136 mmol/L (ref 135–145)
Total Bilirubin: 0.4 mg/dL (ref 0.3–1.2)
Total Protein: 7.4 g/dL (ref 6.5–8.1)

## 2021-10-17 NOTE — Progress Notes (Signed)
[facility]  Progress Note  Patient Care Team: [name], FNP as PCP - General (Family Medicine) Deboraha Sprang, MD as PCP - Cardiology (Cardiology) Earlie Server, MD as Consulting Physician (Oncology)  REASON FOR VISIT Follow up for thrombocytopenia   HISTORY OF PRESENTING ILLNESS: Mario Liu 61 y.o. male is here for evaluation of low platelet count. He has Down syndrome.   He had labs on with primary care provider on December 15, 2016 which showed WBC 5.3 hemoglobin 14.3 MCV 88 RDW 13.8 MPV 11.37 platelet 111 lymphocyte 46.69% absolute lymphocytes 2.5. BMP normal. normal LFT. It was noted that hepatitis C antibody reactive.  Patient was seen by [facility] for workupof her positive hepatitis C antibody Further tests including HCV RNA by PCR was negative.  Hep B core antibody negative, Hb surface antibody nonreactive,A antibody positive,Hep A IgM negative. So that patient did not really have hepatitis C, additional ultrasound and fibrocystic was canceled by GI.  Patient reports feeling well, good energy level, good appetite, denies any weight loss fatigue, fever or chills. Patient is a poor historian. He told me that he used to drink liquor but haven't drink lately.  In September 2018, patient had a stroke, acute ischemic left MCA stroke. His in getting physical therapy. Denies any easy bruising or bleeding. He appears to be less duct if compared to prior to the stroke. Able to have simple conversation with me.Denies any recent infection, bleeding, surgery. He has some dysphagia and speech intelligibility which slowly improving with the physical therapy and speech therapy according to the group home staff.  Admitted from 07/05/2018 to 07/07/2018 due to after the mental status. He had acute cystitis urine culture grow strep agalactiae.  Patient was treated with IV antibiotics and switched to oral Augmentin. Acute kidney injury/dehydration resolved after  hydration.  04/21/2019.  Peripheral lab flow cytometry No monoclonal B cell population is detected. kappa:lambda ratio 1.8  There is no loss of, or aberrant expression of, the pan T cell antigens to suggest a neoplastic T cell process. CD4:CD8 ratio 2.5. No circulating blasts are detected. There is no immunophenotypic evidence of abnormal myeloid maturation. Analysis of the leukocyte population shows: granulocytes 50%, monocytes 10%, lymphocytes 40%, blasts <0.5%, B cells 8%, T cells 30%, NK cells 2%.     INTERVAL HISTORY: Patient presents to clinic for all follow-up of thrombocytopenia. He has history of chronic cognitive impairment & downs syndrome. He is accompanied by staff from group home. I received permission to evaluate him today. By his report he feels well and denies complaints. Caregiver reports urinary urgency at times. No abnormal bleeding or bruising.   Review of Systems  Unable to perform ROS: Other (Chronic cognitive impairment.)   MEDICAL HISTORY: Past Medical History:  Diagnosis Date   Acute ischemic left MCA stroke ([facility])    Altered mental status 03/09/2018   Baker's cyst of knee, left 2018   Bilateral renal cysts    COPD (chronic obstructive pulmonary disease) ([facility])    DDD (degenerative disc disease), lumbar    Down's syndrome    GERD (gastroesophageal reflux disease)    Hepatitis C    Hepatitis C antibody test positive 05/16/2017   Hx of cholecystectomy    Hypothyroidism    Kidney cysts    Lumbar spondylosis    Major depressive disorder    Was treated at facility in [place] ([facility] years ago   PFO with atrial septal aneurysm  PONV (postoperative nausea and vomiting)    Recurrent falls 03/01/2018   Right kidney mass    Seizure disorder ([place]) 11/24/2017   Thrombocytopenia ([place])    Tobacco abuse    Vitamin D deficiency     SURGICAL HISTORY: Past Surgical History:  Procedure Laterality Date   IR RADIOLOGIST EVAL & MGMT  10/13/2018   IR RADIOLOGIST EVAL &  MGMT  03/03/2019   IR RADIOLOGIST EVAL & MGMT  11/16/2019   LAPAROSCOPIC CHOLECYSTECTOMY     RADIOLOGY WITH ANESTHESIA Right 11/24/2018   Procedure: CT WITH ANESTHESIA RENAL CRYOABLATION WITH BIOPSY;  Surgeon: Markus Daft, MD;  Location: [facility];  Service: Anesthesiology;  Laterality: Right;    SOCIAL HISTORY: Social History   Socioeconomic History   Marital status: Single    Spouse name: Not on file   Number of children: Not on file   Years of education: Not on file   Highest education level: Not on file  Occupational History   Not on file  Tobacco Use   Smoking status: Some Days    Packs/day: 0.50    Years: 39.00    Pack years: 19.50    Types: Cigarettes   Smokeless tobacco: Never   Tobacco comments:    pt is still smoking  Vaping Use   Vaping Use: Never used  Substance and Sexual Activity   Alcohol use: Not Currently   Drug use: No   Sexual activity: Not on file  Other Topics Concern   Not on file  Social History Narrative   Not on file   Social Determinants of Health   Financial Resource Strain: Not on file  Food Insecurity: Not on file  Transportation Needs: Not on file  Physical Activity: Not on file  Stress: Not on file  Social Connections: Not on file  Intimate Partner Violence: Not on file    FAMILY HISTORY Family History  Adopted: Yes  Problem Relation Age of Onset   Diabetes Maternal Grandmother    Heart attack Maternal Aunt    Prostate cancer Neg Hx    Bladder Cancer Neg Hx    Kidney cancer Neg Hx     ALLERGIES:  has No Known Allergies.  MEDICATIONS:  Current Outpatient Medications  Medication Sig Dispense Refill   acetaminophen (TYLENOL) 325 MG tablet Take 650 mg by mouth every 6 (six) hours as needed.     alendronate (FOSAMAX) 70 MG tablet Take 70 mg by mouth once a week. Fridays     aspirin EC 81 MG tablet Take 81 mg by mouth daily. Swallow whole.     atorvastatin (LIPITOR) 40 MG tablet Take 40 mg by mouth at bedtime.      benztropine  (COGENTIN) 1 MG tablet Take 1 mg by mouth at bedtime.     buPROPion (WELLBUTRIN XL) 150 MG 24 hr tablet Take 150 mg by mouth daily.      Calcium Carb-Cholecalciferol (CALCIUM 600-D PO) Take by mouth 2 (two) times daily.     cholecalciferol (VITAMIN D3) 25 MCG (1000 UT) tablet Take 1,000 Units by mouth daily.     citalopram (CELEXA) 40 MG tablet Take 40 mg by mouth daily.     clopidogrel (PLAVIX) 75 MG tablet Take 75 mg daily by mouth.     donepezil (ARICEPT) 5 MG tablet Take 5 mg by mouth at bedtime.     fluticasone (FLONASE) 50 MCG/ACT nasal spray Place 1 spray into both nostrils daily.     Glycopyrrolate-Formoterol 9-4.8 MCG/ACT AERO  Inhale 2 puffs into the lungs 2 (two) times daily.     loratadine (CLARITIN) 10 MG tablet Take 10 mg by mouth daily.     risperidone (RISPERDAL) 4 MG tablet Take 4 mg by mouth at bedtime.     tamsulosin (FLOMAX) 0.4 MG CAPS capsule Take 0.4 mg by mouth.     traZODone (DESYREL) 50 MG tablet Take 1 tablet (50 mg total) by mouth at bedtime.     albuterol (PROVENTIL HFA;VENTOLIN HFA) 108 (90 Base) MCG/ACT inhaler Inhale 1-2 puffs into the lungs every 4 (four) hours as needed for wheezing or shortness of breath. (Patient not taking: Reported on 10/17/2021)     clonazePAM (KLONOPIN) 0.5 MG tablet Take 0.5 mg by mouth daily as needed for anxiety (at bedtime). (Patient not taking: Reported on 10/17/2021)     diphenhydrAMINE HCl (BENADRYL ALLERGY PO) Take by mouth as needed. (Patient not taking: Reported on 10/17/2021)     Loperamide HCl (IMODIUM PO) Take by mouth as needed. (Patient not taking: Reported on 10/17/2021)     magnesium hydroxide (MILK OF MAGNESIA) 400 MG/5ML suspension Take by mouth daily as needed for mild constipation. (Patient not taking: Reported on 10/17/2021)     No current facility-administered medications for this visit.    PHYSICAL EXAMINATION: ECOG PERFORMANCE STATUS: 1 - Symptomatic but completely ambulatory  Today's Vitals   10/17/21 0956 10/17/21 1445   BP:  109/83  Pulse:  83  Resp:  16  Temp:  (!) 96.1 F (35.6 C)  TempSrc:  Tympanic  Weight:  201 lb (91.2 kg)  PainSc: 0-No pain 0-No pain   Body mass index is 36.76 kg/m.  Physical Exam Constitutional:      Appearance: He is obese. He is not ill-appearing.  Pulmonary:     Effort: Pulmonary effort is normal.  Musculoskeletal:     Comments: Ambulatory w/o aids  Skin:    Coloration: Skin is not pale.  Neurological:     Mental Status: He is alert. Mental status is at baseline.  Psychiatric:        Mood and Affect: Mood normal.        Behavior: Behavior normal.   LABORATORY DATA: I have personally reviewed the data as listed:  CBC    Component Value Date/Time   WBC 5.2 10/17/2021 0931   RBC 5.15 10/17/2021 0931   HGB 14.2 10/17/2021 0931   HGB 13.6 04/07/2018 1320   HCT 44.5 10/17/2021 0931   HCT 42.6 04/07/2018 1320   PLT 125 (L) 10/17/2021 0931   PLT 121 (L) 04/07/2018 1320   MCV 86.4 10/17/2021 0931   MCV 86 04/07/2018 1320   MCH 27.6 10/17/2021 0931   MCHC 31.9 10/17/2021 0931   RDW 12.4 10/17/2021 0931   RDW 12.2 (L) 04/07/2018 1320   LYMPHSABS 1.9 10/17/2021 0931   LYMPHSABS 2.3 04/07/2018 1320   MONOABS 0.4 10/17/2021 0931   EOSABS 0.3 10/17/2021 0931   EOSABS 0.4 04/07/2018 1320   BASOSABS 0.0 10/17/2021 0931   BASOSABS 0.0 04/07/2018 1320   CMP Latest Ref Rng & Units 10/17/2021 10/16/2020 08/28/2020  Glucose 70 - 99 mg/dL 112(H) 129(H) 130(H)  BUN 6 - 20 mg/dL 16 13 13   Creatinine 0.61 - 1.24 mg/dL 1.37(H) 1.26(H) 1.07  Sodium 135 - 145 mmol/L 136 140 140  Potassium 3.5 - 5.1 mmol/L 3.7 3.9 4.3  Chloride 98 - 111 mmol/L 105 104 105  CO2 22 - 32 mmol/L 27 29 24   Calcium 8.9 -  10.3 mg/dL 8.8(L) 9.4 9.4  Total Protein 6.5 - 8.1 g/dL 7.4 7.2 -  Total Bilirubin 0.3 - 1.2 mg/dL 0.4 0.4 -  Alkaline Phos 38 - 126 U/L 49 43 -  AST 15 - 41 U/L 21 22 -  ALT 0 - 44 U/L 20 14 -     RADIOGRAPHIC STUDIES: I have personally reviewed the radiological images  as listed and agree with the findings in the report No results found.    ASSESSMENT/PLAN 1. Thrombocytopenia ([place])   2. Normocytic anemia   3. 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 Labs reviewed with patient and group home staff member, Mario Liu. Also called his guardian/sister, Mario Liu and reviewed visit information and findings. Plt count is 125 today. Stable. Clinically, asymptomatic.   # Normocytic anemia- stable. Hemoglobin is normal today. Monitor.   # Elevated Creatinine- kidney function has worsened. I recommended that he see Dr. Erlene Quan who has been following him for hx of RCC s/p cryoblation in 2020.   # Urinary Urgency- chronic, possibly slightly worse per caregiver. Recommend he follow up with urology.    1 year - labs (cbc, cmp) and see Dr. Tasia Catchings  All questions were answered. The patient knows to call the clinic with any problems, questions or concerns.  Beckey Rutter, DNP, AGNP-C [facility] [phone] (clinic) 10/17/2021  CC: Threasa Alpha, NP & Dr. Erlene Quan

## 2021-10-17 NOTE — Progress Notes (Signed)
Pt and caregiver from group home in for follow up.  RN reviewed med list, caregiver states pt is taking plavix and aspirin 81 mg dailly. Pt denies any concerns today.

## 2021-10-22 ENCOUNTER — Telehealth: Payer: Self-pay

## 2021-10-22 ENCOUNTER — Encounter: Payer: Self-pay | Admitting: Adult Health

## 2021-10-22 ENCOUNTER — Ambulatory Visit (INDEPENDENT_AMBULATORY_CARE_PROVIDER_SITE_OTHER): Payer: Medicare Other | Admitting: Adult Health

## 2021-10-22 VITALS — BP 108/74 | HR 91 | Ht 66.0 in | Wt 203.0 lb

## 2021-10-22 DIAGNOSIS — Z8673 Personal history of transient ischemic attack (TIA), and cerebral infarction without residual deficits: Secondary | ICD-10-CM | POA: Diagnosis not present

## 2021-10-22 DIAGNOSIS — G40909 Epilepsy, unspecified, not intractable, without status epilepticus: Secondary | ICD-10-CM | POA: Diagnosis not present

## 2021-10-22 NOTE — Progress Notes (Signed)
[facility] [address] [place]. [place] [address] [phone]       OFFICE FOLLOW-UP NOTE  Mario Liu Date of Birth:  04/12/1961 Medical Record Number:  [id number]    Reason for visit: Stroke [facility] provider: Dr. Leonie Man    Chief complaint: Chief Complaint  Patient presents with   Follow-up    RM 3 with facility staff Mario Liu Pt is well and stable, no sz or no new concerns since last visit.       HPI:  Update 10/22/2021 JM: Returns for yearly stroke and seizure follow-up.  Accompanied by group home staff, Mario Liu. overall stable without new stroke/TIA symptoms or seizure activity.  Post stroke chronic residual dysarthria stable.  Compliant on Plavix and atorvastatin without side effects but per [facility] review, he has also continued on aspirin which was requested to be d/c'd at prior visit.  Blood pressure today 108/74. Routinely monitored at group home which has been stable. Routinely follows with PCP for repeat labs which have been stable (unable to view via epic).  Remains off AED. Of note, currently on multiple mood disorder medications including bupropion, Celexa, clonazepam, risperidone and trazodone. Also on Aricept for cognitive impairment. No new concerns at this time.     History provided for reference purposes only Update 10/22/2020 JM: Mario Liu returns for stroke follow-up after prior visit 10 months ago accompanied by group home caregiver, Mario Liu who helps facilitate history due to cognitive impairment with history of Down syndrome. Stable from stroke standpoint without new stroke/TIA symptoms with residual dysarthria which has been stable. Ongoing compliance with Plavix and atorvastatin without side effects.  He is also on aspirin 81 mg daily although unknown reason.  Blood pressure today stable at 124/81. No reoccurring seizure activity -Keppra 500 mg daily placed on medication list 02/2020 (unable to verify provider) but Keppra previously switched to  Vimpat back in 11/2017 and Vimpat completely discontinued in 05/2018 due to side effect concerns.  He has not been on any AEDs since that time nor is keppra on facility medication list -this was removed from medication list.  No concerns at this time.  Update 12/20/2019 JM:Mario Liu returns for follow-up accompanied by group home caregiver.  He has been stable from a neurological standpoint with residual stroke deficits of dysarthria which has been stable; denies new or worsening stroke/TIA symptoms.  Previously on Vimpat for seizure management but discontinued during admission for AMS in 02/2018 without recurrent seizure activity.  Multiple prior episodes some with ED evaluations of altered mental status with transient confusion, worsening cognition and lethargy with repeat EEG unremarkable.  Caregiver reports moving patient to a different group home approximately 3 months ago in hopes of behavioral changes/improvement which significantly improved overall mood and has not had any reoccurring altered mental status changes or concerns since that time.  Continues on clopidogrel and atorvastatin for secondary stroke prevention.  Blood pressure today 113/75.  No concerns at this time.  Update 10/10/2019 JM: Mario Liu is a 61 year old male who is being seen today for 31-month follow-up regarding history of stroke, seizures and transient altered mental status accompanied by group home transporter/caregiver.  Residual stroke deficits of dysarthria and reported dysphagia has been stable.  Due to concerns with episodes of altered mental status EEG and MRI w/wo ordered.  MRI w/wo on 08/02/2019 showed chronic left parietal and left temporal ischemic infarction but no acute abnormalities.  EEG has not been completed at this time due to difficulty contacting group home.  Caregiver does report having an episode last week of having difficulty with ambulation and standing and had to use wheelchair to bring patient in to see his PCP.   Per caregiver, questionable UTI and was started on antibiotics and increase hydration.  Cognitive impairment has been stable without worsening.  He continues to follow with Dr. Rosine Door psychiatry with ongoing use of multiple psychiatric medications.  Continues on Plavix and atorvastatin for secondary stroke prevention without side effects.  Blood pressure today 122/72.  No further concerns at this time.  Update via virtual visit 07/11/2019 JM: Mario Liu is a 61 year old male who is being seen today for follow-up.  He is accompanied by his group home caregiver, Rise Paganini.  Since prior visit, he has been transition to a different group home in 09/2018.  Since prior visit, he has had multiple ED admissions for generalized weakness and AMS with all work-up unremarkable.  Most recent ED admission on 06/27/2019 due to concerns of generalized weakness and urinating on himself with work-up unremarkable.  Per ED note, differential diagnosis possible unwitnessed seizure with incontinence and postictal lethargy but no medication changes.  Recommended to follow-up with neurology outpatient.  Prior history of seizures on Vimpat but was discontinued after AMS hospitalization in 02/2018 due to possible side effects.  Per discussion with caregiver, she states he was "out of it" with increased confusion, decreased cognition and lethargy for 2 to 3 days and then resolved spontaneously.  Cognition/memory has been stable per caregiver with occasional incontinence but caregiver believes this is typically due to waiting too long to go to the bathroom.  He will occasionally need help with daily functioning such as getting dressed but for the most part independent.  He continues to ambulate without any recent falls.  She denies any worsening of his cognition or memory.  Denies any medication changes.  Currently on bupropion 150 mg daily, Celexa 40 mg daily, Klonopin 0.5 mg daily as needed for anxiety, Aricept 5 mg nightly, trazodone 50 mg  nightly and risperidone 4 mg nightly.  She denies any witnessed seizure activity or seizure type symptoms.  Continues on Plavix and Lipitor for secondary stroke prevention without side effects.  Continues to follow with psychiatry regularly.  Update 05/27/2018 JM: Patient is being seen today for scheduled follow-up visit and is accompanied by group home caregiver.  Since previous appointment, he was seen at [facility] on 03/08/2018 with concerns of altered mental status.  He was seen previously on 03/01/2018 with similar complaints and was diagnosed with a UTI at that time.  UTI negative on 03/08/2018 admission.  MRI head reviewed and was negative for acute abnormality.  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  Patient was discharged back to group home in stable condition.  Patient states overall he has been doing well.  He has not had additional altered mental status episodes since most recent hospitalization.  On the hospitalization of 03/08/2018, Vimpat was discontinued for unclear reasons -possibly due to potential side effects of increased fatigue and altered mental status.  Since thisdiscontinuation, patient has not had any seizure like activity.  He has been more active in his group home.  Continues to take Plavix and Lipitor without side effects for secondary stroke prevention.  He was seen by psychiatry who restarted him on his psychiatric medications.  Denies  new or worsening stroke/TIA symptoms or seizure-like activity.  11/24/2017 visit Dr. Leonie Man :Patient is seen today for follow-up after his last visit 3 months ago. He is accompanied by his caregiver from the group home. Patient is unable to provide history. I have reviewed notes which accompany him and also I  called and spoke to a nurse at the group  home where he stays. Patient apparently was visiting family during Christmas time and had a bunch of seizures. Detailed description is not available. Patient was initially started on Keppra but for some reason was switched to Vimpat. He is currently on 200 mg twice daily and apparently tolerating it well and has had no witnessed seizures in the group home. He is otherwise in good health with no neurological changes. His tolerating aspirin without any bruising or bleeding. His blood pressure seems well controlled. He has not had any recurrent stroke or TIA symptoms. Patient has not had any brain imaging or EEG study done for seizures.  Stroke admission 04/2017: Mr. Reddy is a 61yo male with PMH significant for depression, Down's syndrome (currently living in a group home), COPD, and tobacco use who presents with aphasia since this morning at around 7am. History obtained from group home caregivers. Patient was in his usual state of health last night before he went to bed around 10pm. He was woken this morning and got up to get dressed. He came down to the kitchen and his caregiver noted that he was holding his underwear and a shirt in his hands, was not talking, and kept looking to his left side. No gait abnormalities. Caregivers state that he has not complained of any chest pain, palpitations, trouble breathing, fevers, or other symptoms recently. Patient has never had a stroke before. They called EMS and he was brought to [facility]. Head CT at [facility] demonstrated hypodensity in L MCA territory, consistent with infarct. He was transferred to [facility] for further evaluation and potential intervention. Patient was not administered IV t-PA secondary to unclear time of onset. CT angiogram and perfusion were obtained which showed a small mismatch but given patient's poor baseline functioning he was not considered a candidate for endovascular treatment. MRI scan of the brain showed a large left MCA infarct with small petechial  hemorrhagic transformation. 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 Lower extremity venous Dopplers were negative for DVT. Transthoracic echo showed normal ejection fraction. Bubble study was positive for small PFO/atrial septal defect. LDL cholesterol was elevated to 80 mg percent and hemoglobin A1c was 5.9. It was decided not to consider further workup with a TEE and loop recorder given his poor functional baseline he is not a candidate for PFO closure on anticoagulation. Patient was started on Lipitor and aspirin.     ROS:   14 system review of systems is positive for see HPI and all other systems negative   PMH:  Past Medical History:  Diagnosis Date   Acute ischemic left MCA stroke (HCC)    Altered mental status 03/09/2018   Baker's cyst of knee, left 2018   Bilateral renal cysts    COPD (chronic obstructive pulmonary disease) (HCC)    DDD (degenerative disc disease), lumbar    Down's syndrome    GERD (gastroesophageal reflux disease)    Hepatitis C    Hepatitis C antibody test positive 05/16/2017   Hx of cholecystectomy    Hypothyroidism    Kidney cysts    Lumbar spondylosis    Major depressive disorder  Was treated at facility in [place] ([facility]) years ago   PFO with atrial septal aneurysm    PONV (postoperative nausea and vomiting)    Recurrent falls 03/01/2018   Right kidney mass    Seizure disorder ([place]) 11/24/2017   Thrombocytopenia (HCC)    Tobacco abuse    Vitamin D deficiency     Social History:  Social History   Socioeconomic History   Marital status: Single    Spouse name: Not on file   Number of children: Not on file   Years of education: Not on file   Highest education level: Not on file  Occupational History   Not on file  Tobacco Use   Smoking status: Some Days    Packs/day: 0.50    Years: 39.00    Pack years: 19.50    Types: Cigarettes   Smokeless tobacco: Never   Tobacco comments:    pt is still smoking  Vaping Use   Vaping Use:  Never used  Substance and Sexual Activity   Alcohol use: Not Currently   Drug use: No   Sexual activity: Not on file  Other Topics Concern   Not on file  Social History Narrative   Not on file   Social Determinants of Health   Financial Resource Strain: Not on file  Food Insecurity: Not on file  Transportation Needs: Not on file  Physical Activity: Not on file  Stress: Not on file  Social Connections: Not on file  Intimate Partner Violence: Not on file    Medications:   Current Outpatient Medications on File Prior to Visit  Medication Sig Dispense Refill   acetaminophen (TYLENOL) 325 MG tablet Take 650 mg by mouth every 6 (six) hours as needed.     albuterol (PROVENTIL HFA;VENTOLIN HFA) 108 (90 Base) MCG/ACT inhaler Inhale 1-2 puffs into the lungs every 4 (four) hours as needed for wheezing or shortness of breath.     alendronate (FOSAMAX) 70 MG tablet Take 70 mg by mouth once a week. Fridays     aspirin EC 81 MG tablet Take 81 mg by mouth daily. Swallow whole.     atorvastatin (LIPITOR) 40 MG tablet Take 40 mg by mouth at bedtime.      benztropine (COGENTIN) 1 MG tablet Take 1 mg by mouth at bedtime.     buPROPion (WELLBUTRIN XL) 150 MG 24 hr tablet Take 150 mg by mouth daily.      Calcium Carb-Cholecalciferol (CALCIUM 600-D PO) Take by mouth 2 (two) times daily.     cholecalciferol (VITAMIN D3) 25 MCG (1000 UT) tablet Take 1,000 Units by mouth daily.     citalopram (CELEXA) 40 MG tablet Take 40 mg by mouth daily.     clonazePAM (KLONOPIN) 0.5 MG tablet Take 0.5 mg by mouth daily as needed for anxiety (at bedtime).     clopidogrel (PLAVIX) 75 MG tablet Take 75 mg daily by mouth.     diphenhydrAMINE HCl (BENADRYL ALLERGY PO) Take by mouth as needed.     donepezil (ARICEPT) 5 MG tablet Take 5 mg by mouth at bedtime.     fluticasone (FLONASE) 50 MCG/ACT nasal spray Place 1 spray into both nostrils daily.     Glycopyrrolate-Formoterol 9-4.8 MCG/ACT AERO Inhale 2 puffs into the  lungs 2 (two) times daily.     Loperamide HCl (IMODIUM PO) Take by mouth as needed.     loratadine (CLARITIN) 10 MG tablet Take 10 mg by mouth daily.  magnesium hydroxide (MILK OF MAGNESIA) 400 MG/5ML suspension Take by mouth daily as needed for mild constipation.     risperidone (RISPERDAL) 4 MG tablet Take 4 mg by mouth at bedtime.     tamsulosin (FLOMAX) 0.4 MG CAPS capsule Take 0.4 mg by mouth.     traZODone (DESYREL) 50 MG tablet Take 1 tablet (50 mg total) by mouth at bedtime.     No current facility-administered medications on file prior to visit.    Allergies:  No Known Allergies     Vitals Today's Vitals   10/22/21 1026  BP: 108/74  Pulse: 91  Weight: 203 lb (92.1 kg)  Height: 5\' 6"  (1.676 m)    Body mass index is 32.77 kg/m.   Physical exam General: well developed, well nourished, pleasant middle-aged African-American male, seated, in no evident distress Head: head normocephalic and atraumatic.   Neck: supple with no carotid or supraclavicular bruits Cardiovascular: regular rate and rhythm, no murmurs Musculoskeletal: no deformity Skin:  no rash/petichiae Vascular:  Normal pulses all extremities   Neurologic Exam Mental Status: Awake and fully alert. Moderate dysarthria. DIsoriented to place and time but oriented to self. Attention span, concentration and fund of knowledge poor -history provided by group home caregiver.  Follows simple step commands.  Mood and affect appropriate.  Cranial Nerves: Pupils equal, briskly reactive to light. Extraocular movements full without nystagmus. Visual fields full to confrontation. Hearing intact. Facial sensation intact. Face, tongue, palate moves normally and symmetrically.  Motor: Normal bulk and tone. Normal strength in all tested extremity muscles. Sensory.: intact to touch , pinprick , position and vibratory sensation.  Coordination: Rapid alternating movements normal in all extremities. Finger-to-nose and heel-to-shin  performed accurately bilaterally. Gait and Station: Arises from chair without difficulty. Stance is normal. Gait demonstrates normal stride length and mild imbalance without use of assistive device Reflexes: 1+ and symmetric. Toes downgoing.         ASSESSMENT: 62 year old African-American male with history of Down syndrome, cryptogenic left MCA infarct 04/2017 with residual cognitive impairment and dysarthria, HLD, HTN and seizures (no longer on AEDs).  He had multiple ED evaluations and hospitalizations in 2018-2019 due to transient altered mental status and transient weakness secondary to unknown etiology. Repeat MRI unremarkable.  AED discontinued during 02/2018 admission for unclear reasons (possibly consider medication side effect) but has not had any reoccurring seizure activity since that time.  Transient altered mental status episodes have subsided after changing group homes with significant improvement of overall mood and functioning   PLAN: -Continue clopidogrel 75 mg daily  and Lipitor for secondary stroke prevention -again, advised to stop aspirin as prolonged DAPT not indicated - will reach out to [facility] to also notify them of this change  -Advised to continue to monitor for any seizure activity and to call office with any concerns -no indication for AED at this time as he has not had any recurrent seizures since d/c'ing AED in 2019 -Discussed secondary stroke prevention measures and importance of maintaining strict control of hypertension with blood pressure goal below 130/90 and cholesterol with LDL cholesterol (bad cholesterol) goal below 70 mg/dL.    Overall stable, no further recommendations. Follow up as needed   CC:  [name], FNP    I spent 26 minutes of face-to-face and non-face-to-face time with patient and caregiver.  This included previsit chart review, lab review, study review, electronic health record documentation, patient and caregiver  education and discussion regarding prior history of  stroke, hx of seizures, importance of managing stroke risk factors and answered all other questions to patient and caregiver satisfaction  Frann Rider, AGNP-BC  [facility] [address] [place], [place] [address]  Phone [phone] Fax [phone] Note: This document was prepared with digital dictation and possible smart phrase technology. Any transcriptional errors that result from this process are unintentional.

## 2021-10-22 NOTE — Telephone Encounter (Signed)
I called [facility] and spoke with Melissa and provided the d/c order for the Asprin. She verbalized understanding and will make note of this change in their system.

## 2021-10-22 NOTE — Telephone Encounter (Signed)
-----   Message from Frann Rider, NP sent at 10/22/2021 11:00 AM EST ----- Can you please contact [facility] to advise the discontinuing of aspirin. Thank you!

## 2021-10-22 NOTE — Patient Instructions (Addendum)
Your Plan:  Continue current treatment plan - ensure close PCP follow up for aggressive stroke risk factor management and monitoring  Please ensure you stop taking aspirin 81mg  daily as no longer needed - no other changes   Continue to monitor for possible seizures, okay to remain off antiseizure medication    Overall stable, no further recommendations. Follow up as needed     Thank you for coming to see [place] at [facility]. I hope we have been able to provide you high quality care today.  You may receive a patient satisfaction survey over the next few weeks. We would appreciate your feedback and comments so that we may continue to improve ourselves and the health of our patients.

## 2021-12-30 NOTE — Progress Notes (Incomplete)
? ?12/30/21 ?7:40 PM  ? ?Mallie Mussel Creeden ?[birth year] ?[id number] ? ?Referring provider:  ?[name], FNP ?[facility] ?[place],  [place] [address] ?No chief complaint on file. ? ? ? ? ?HPI: ?Mario Liu is a 61 y.o.male ? ? ? ? ? ?PMH: ?Past Medical History:  ?Diagnosis Date  ? Acute ischemic left MCA stroke ([place])   ? Altered mental status 03/09/2018  ? Baker's cyst of knee, left 2018  ? Bilateral renal cysts   ? COPD (chronic obstructive pulmonary disease) ([place])   ? DDD (degenerative disc disease), lumbar   ? Down's syndrome   ? GERD (gastroesophageal reflux disease)   ? Hepatitis C   ? Hepatitis C antibody test positive 05/16/2017  ? Hx of cholecystectomy   ? Hypothyroidism   ? Kidney cysts   ? Lumbar spondylosis   ? Major depressive disorder   ? Was treated at facility in [place] ([facility]) years ago  ? PFO with atrial septal aneurysm   ? PONV (postoperative nausea and vomiting)   ? Recurrent falls 03/01/2018  ? Right kidney mass   ? Seizure disorder ([place]) 11/24/2017  ? Thrombocytopenia ([place])   ? Tobacco abuse   ? Vitamin D deficiency   ? ? ?Surgical History: ?Past Surgical History:  ?Procedure Laterality Date  ? IR RADIOLOGIST EVAL & MGMT  10/13/2018  ? IR RADIOLOGIST EVAL & MGMT  03/03/2019  ? IR RADIOLOGIST EVAL & MGMT  11/16/2019  ? LAPAROSCOPIC CHOLECYSTECTOMY    ? RADIOLOGY WITH ANESTHESIA Right 11/24/2018  ? Procedure: CT WITH ANESTHESIA RENAL CRYOABLATION WITH BIOPSY;  Surgeon: Markus Daft, MD;  Location: [facility];  Service: Anesthesiology;  Laterality: Right;  ? ? ?Home Medications:  ?Allergies as of 01/01/2022   ?No Known Allergies ?  ? ?  ?Medication List  ?  ? ?  ? Accurate as of December 30, 2021  7:40 PM. If you have any questions, ask your nurse or doctor.  ?  ?  ? ?  ? ?acetaminophen 325 MG tablet ?Commonly known as: TYLENOL ?Take 650 mg by mouth every 6 (six) hours as needed. ?  ?albuterol 108 (90 Base) MCG/ACT inhaler ?Commonly known as: VENTOLIN HFA ?Inhale 1-2 puffs into the lungs every 4 (four) hours as  needed for wheezing or shortness of breath. ?  ?alendronate 70 MG tablet ?Commonly known as: FOSAMAX ?Take 70 mg by mouth once a week. Fridays ?  ?atorvastatin 40 MG tablet ?Commonly known as: LIPITOR ?Take 40 mg by mouth at bedtime. ?  ?BENADRYL ALLERGY PO ?Take by mouth as needed. ?  ?benztropine 1 MG tablet ?Commonly known as: COGENTIN ?Take 1 mg by mouth at bedtime. ?  ?buPROPion 150 MG 24 hr tablet ?Commonly known as: WELLBUTRIN XL ?Take 150 mg by mouth daily. ?  ?CALCIUM 600-D PO ?Take by mouth 2 (two) times daily. ?  ?cholecalciferol 25 MCG (1000 UNIT) tablet ?Commonly known as: VITAMIN D3 ?Take 1,000 Units by mouth daily. ?  ?citalopram 40 MG tablet ?Commonly known as: CELEXA ?Take 40 mg by mouth daily. ?  ?clonazePAM 0.5 MG tablet ?Commonly known as: KLONOPIN ?Take 0.5 mg by mouth daily as needed for anxiety (at bedtime). ?  ?clopidogrel 75 MG tablet ?Commonly known as: PLAVIX ?Take 75 mg daily by mouth. ?  ?donepezil 5 MG tablet ?Commonly known as: ARICEPT ?Take 5 mg by mouth at bedtime. ?  ?fluticasone 50 MCG/ACT nasal spray ?Commonly known as: FLONASE ?Place 1 spray into both nostrils daily. ?  ?  Glycopyrrolate-Formoterol 9-4.8 MCG/ACT Aero ?Inhale 2 puffs into the lungs 2 (two) times daily. ?  ?IMODIUM PO ?Take by mouth as needed. ?  ?loratadine 10 MG tablet ?Commonly known as: CLARITIN ?Take 10 mg by mouth daily. ?  ?magnesium hydroxide 400 MG/5ML suspension ?Commonly known as: MILK OF MAGNESIA ?Take by mouth daily as needed for mild constipation. ?  ?risperidone 4 MG tablet ?Commonly known as: RISPERDAL ?Take 4 mg by mouth at bedtime. ?  ?tamsulosin 0.4 MG Caps capsule ?Commonly known as: FLOMAX ?Take 0.4 mg by mouth. ?  ?traZODone 50 MG tablet ?Commonly known as: DESYREL ?Take 1 tablet (50 mg total) by mouth at bedtime. ?  ? ?  ? ? ?Allergies:  ?No Known Allergies ? ?Family History: ?Family History  ?Adopted: Yes  ?Problem Relation Age of Onset  ? Diabetes Maternal Grandmother   ? Heart attack  Maternal Aunt   ? Prostate cancer Neg Hx   ? Bladder Cancer Neg Hx   ? Kidney cancer Neg Hx   ? ? ?Social History:  reports that he has been smoking cigarettes. He has a 19.50 pack-year smoking history. He has never used smokeless tobacco. He reports that he does not currently use alcohol. He reports that he does not use drugs. ? ? ?Physical Exam: ?There were no vitals taken for this visit.  ?Constitutional:  Alert and oriented, No acute distress. ?HEENT: [place] AT, moist mucus membranes.  Trachea midline, no masses. ?Cardiovascular: No clubbing, cyanosis, or edema. ?Respiratory: Normal respiratory effort, no increased work of breathing. ?Skin: No rashes, bruises or suspicious lesions. ?Neurologic: Grossly intact, no focal deficits, moving all 4 extremities. ?Psychiatric: Normal mood and affect. ? ?Laboratory Data: ? ?Lab Results  ?Component Value Date  ? CREATININE 1.37 (H) 10/17/2021  ? ?Lab Results  ?Component Value Date  ? HGBA1C 5.9 (H) 05/15/2017  ? ? ?Urinalysis ? ? ?Pertinent Imaging: ? ? ? ?Assessment & Plan:   ? ? ?No follow-ups on file. ? ?I,Kailey Littlejohn,acting as a scribe for Hollice Espy, MD.,have documented all relevant documentation on the behalf of Hollice Espy, MD,as directed by  Hollice Espy, MD while in the presence of Hollice Espy, MD. ? ? ?[facility] ?[address] ?[place], [place] [address] ?[phone] ? ?

## 2021-12-31 ENCOUNTER — Other Ambulatory Visit: Payer: Self-pay | Admitting: *Deleted

## 2021-12-31 DIAGNOSIS — C641 Malignant neoplasm of right kidney, except renal pelvis: Secondary | ICD-10-CM

## 2022-01-01 ENCOUNTER — Ambulatory Visit: Payer: Self-pay | Admitting: Urology

## 2022-02-21 ENCOUNTER — Ambulatory Visit
Admission: RE | Admit: 2022-02-21 | Discharge: 2022-02-21 | Disposition: A | Discharge status: Home / Self Care | Payer: Medicare Other | Source: Ambulatory Visit | Attending: Adult Health | Admitting: Adult Health

## 2022-02-21 ENCOUNTER — Other Ambulatory Visit: Payer: Self-pay | Admitting: Adult Health

## 2022-02-21 DIAGNOSIS — R2241 Localized swelling, mass and lump, right lower limb: Secondary | ICD-10-CM | POA: Diagnosis present

## 2022-04-29 ENCOUNTER — Ambulatory Visit: Payer: Medicaid Other | Admitting: Urology

## 2022-04-29 ENCOUNTER — Ambulatory Visit
Admission: RE | Admit: 2022-04-29 | Discharge: 2022-04-29 | Disposition: A | Discharge status: Home / Self Care | Payer: Medicare Other | Source: Ambulatory Visit | Attending: Urology | Admitting: Urology

## 2022-04-29 DIAGNOSIS — C641 Malignant neoplasm of right kidney, except renal pelvis: Secondary | ICD-10-CM | POA: Insufficient documentation

## 2022-04-29 MED ORDER — GADOBUTROL 1 MMOL/ML IV SOLN
9.0000 mL | Freq: Once | INTRAVENOUS | Status: AC | PRN
  Administered 2022-04-29: 9 mL via INTRAVENOUS

## 2022-05-07 ENCOUNTER — Encounter: Payer: Self-pay | Admitting: Urology

## 2022-05-07 ENCOUNTER — Ambulatory Visit (INDEPENDENT_AMBULATORY_CARE_PROVIDER_SITE_OTHER): Payer: Medicare Other | Admitting: Urology

## 2022-05-07 VITALS — BP 118/72 | HR 91 | Ht 66.0 in | Wt 203.0 lb

## 2022-05-07 DIAGNOSIS — N281 Cyst of kidney, acquired: Secondary | ICD-10-CM | POA: Diagnosis not present

## 2022-05-07 DIAGNOSIS — Z85528 Personal history of other malignant neoplasm of kidney: Secondary | ICD-10-CM | POA: Diagnosis not present

## 2022-05-07 DIAGNOSIS — N1330 Unspecified hydronephrosis: Secondary | ICD-10-CM | POA: Diagnosis not present

## 2022-05-07 DIAGNOSIS — C641 Malignant neoplasm of right kidney, except renal pelvis: Secondary | ICD-10-CM

## 2022-05-07 NOTE — Progress Notes (Signed)
05/07/22 12:11 PM   Mario Liu 20-Jul-1961 [id number]  Referring provider:  [name], [place],  [place] [address] Chief Complaint  Patient presents with   Follow-up      HPI: Mario Liu is a 61 y.o.male With the personal history of Down syndrome, acquired complex renal cyst hydronephrosis, and renal cell carcinoma, who presents today for follow up with MRI.   He is status post cryoablation for his renal cell carcinoma in 2020.  He was last seen in clinic on 12/26/2020 At the time he underwent an MRI that showed stable post ablation changes in the upper pole of the right kidney without finding suggest local recurrence and there was multiple Bosniak class one cyst and large Bosniak Class 2 renal sinus cyst  in the lower pole of the right kidney similar to prior examination.And there was a small left adrenal adenom  MRI on 04/29/2022 for surveillance of his history of renal cell carcinoma visualized stable cryoablation site in the upper pole of the right kidney with no growth or enhancement. Stable bilateral Bosniak 1 renal cyst and no adenopathy.  He is being followed by nephrology.   He is doing well today and has some urinary frequency he drinks a lot of water. Patient denies any modifying or aggravating factors.  Patient denies any gross hematuria, dysuria or suprapubic/flank pain.  Patient denies any fevers, chills, nausea or vomiting.     PMH: Past Medical History:  Diagnosis Date   Acute ischemic left MCA stroke ([facility])    Altered mental status 03/09/2018   Baker's cyst of knee, left 2018   Bilateral renal cysts    COPD (chronic obstructive pulmonary disease) ([facility])    DDD (degenerative disc disease), lumbar    Down's syndrome    GERD (gastroesophageal reflux disease)    Hepatitis C    Hepatitis C antibody test positive 05/16/2017   Hx of cholecystectomy    Hypothyroidism    Kidney cysts    Lumbar spondylosis    Major depressive disorder     Was treated at facility in [place] ([facility]) years ago   PFO with atrial septal aneurysm    PONV (postoperative nausea and vomiting)    Recurrent falls 03/01/2018   Right kidney mass    Seizure disorder ([place]) 11/24/2017   Thrombocytopenia ([place])    Tobacco abuse    Vitamin D deficiency     Surgical History: Past Surgical History:  Procedure Laterality Date   IR RADIOLOGIST EVAL & MGMT  10/13/2018   IR RADIOLOGIST EVAL & MGMT  03/03/2019   IR RADIOLOGIST EVAL & MGMT  11/16/2019   LAPAROSCOPIC CHOLECYSTECTOMY     RADIOLOGY WITH ANESTHESIA Right 11/24/2018   Procedure: CT WITH ANESTHESIA RENAL CRYOABLATION WITH BIOPSY;  Surgeon: Markus Daft, MD;  Location: [facility];  Service: Anesthesiology;  Laterality: Right;    Home Medications:  Allergies as of 05/07/2022   No Known Allergies      Medication List        Accurate as of May 07, 2022 12:11 PM. If you have any questions, ask your nurse or doctor.          acetaminophen 325 MG tablet Commonly known as: TYLENOL Take 650 mg by mouth every 6 (six) hours as needed.   albuterol 108 (90 Base) MCG/ACT inhaler Commonly known as: VENTOLIN HFA Inhale 1-2 puffs into the lungs every 4 (four) hours as needed for wheezing or shortness of breath.  alendronate 70 MG tablet Commonly known as: FOSAMAX Take 70 mg by mouth once a week. Fridays   atorvastatin 40 MG tablet Commonly known as: LIPITOR Take 40 mg by mouth at bedtime.   BENADRYL ALLERGY PO Take by mouth as needed.   benztropine 1 MG tablet Commonly known as: COGENTIN Take 1 mg by mouth at bedtime.   buPROPion 150 MG 24 hr tablet Commonly known as: WELLBUTRIN XL Take 150 mg by mouth daily.   CALCIUM 600-D PO Take by mouth 2 (two) times daily.   cholecalciferol 25 MCG (1000 UNIT) tablet Commonly known as: VITAMIN D3 Take 1,000 Units by mouth daily.   citalopram 40 MG tablet Commonly known as: CELEXA Take 40 mg by mouth daily.   clonazePAM 0.5 MG  tablet Commonly known as: KLONOPIN Take 0.5 mg by mouth daily as needed for anxiety (at bedtime).   clopidogrel 75 MG tablet Commonly known as: PLAVIX Take 75 mg daily by mouth.   donepezil 5 MG tablet Commonly known as: ARICEPT Take 5 mg by mouth at bedtime.   fluticasone 50 MCG/ACT nasal spray Commonly known as: FLONASE Place 1 spray into both nostrils daily.   Glycopyrrolate-Formoterol 9-4.8 MCG/ACT Aero Inhale 2 puffs into the lungs 2 (two) times daily.   IMODIUM PO Take by mouth as needed.   loratadine 10 MG tablet Commonly known as: CLARITIN Take 10 mg by mouth daily.   magnesium hydroxide 400 MG/5ML suspension Commonly known as: MILK OF MAGNESIA Take by mouth daily as needed for mild constipation.   risperidone 4 MG tablet Commonly known as: RISPERDAL Take 4 mg by mouth at bedtime.   tamsulosin 0.4 MG Caps capsule Commonly known as: FLOMAX Take 0.4 mg by mouth.   traZODone 50 MG tablet Commonly known as: DESYREL Take 1 tablet (50 mg total) by mouth at bedtime.        Allergies:  No Known Allergies  Family History: Family History  Adopted: Yes  Problem Relation Age of Onset   Diabetes Maternal Grandmother    Heart attack Maternal Aunt    Prostate cancer Neg Hx    Bladder Cancer Neg Hx    Kidney cancer Neg Hx     Social History:  reports that he has been smoking cigarettes. He has a 19.50 pack-year smoking history. He has never used smokeless tobacco. He reports that he does not currently use alcohol. He reports that he does not use drugs.   Physical Exam: BP 118/72   Pulse 91   Ht '5\' 6"'$  (1.676 m)   Wt 203 lb (92.1 kg)   BMI 32.77 kg/m   Constitutional:  Alert and oriented, No acute distress.  Accompanied by caretaker today. HEENT: [place] AT, moist mucus membranes.  Trachea midline, no masses. Cardiovascular: No clubbing, cyanosis, or edema. Respiratory: Normal respiratory effort, no increased work of breathing. Skin: No rashes, bruises or  suspicious lesions. Neurologic: Grossly intact, no focal deficits, moving all 4 extremities. Psychiatric: Normal mood and affect.  Laboratory Data:  Lab Results  Component Value Date   CREATININE 1.37 (H) 10/17/2021    Pertinent Imaging: CLINICAL DATA:  Prior cryoablation to the RIGHT kidney.   EXAM: MRI ABDOMEN WITHOUT AND WITH CONTRAST   TECHNIQUE: Multiplanar multisequence MR imaging of the abdomen was performed both before and after the administration of intravenous contrast.   CONTRAST:  67m GADAVIST GADOBUTROL 1 MMOL/ML IV SOLN   COMPARISON:  MRI 04/29/2021   FINDINGS: Lower chest:  Lung bases are clear.  Hepatobiliary: No focal hepatic lesion.  No biliary duct dilatation.   Pancreas: Normal pancreatic parenchymal intensity. No ductal dilatation or inflammation.   Spleen: Normal spleen.   Adrenals/urinary tract: Adrenal glands normal.   Rounded post ablation defect deep to the upper pole of the RIGHT kidney is again demonstrated measuring 2.8 by 3.1 cm (image 15/series 6) compared to 2.8 by 2.2 cm comparison on MRI for no interval change in size. postcontrast T1 weighted imaging demonstrates no enhancing elements at the treatment site (image 177/series 13).   There are additional nonenhancing Bosniak 1 renal cysts of the RIGHT kidney. No enhancing lesion present in the RIGHT kidney. similar Bosniak 1 renal cyst of the LEFT kidney   Stomach/Bowel: Stomach and limited of the small bowel is unremarkable   Vascular/Lymphatic: Abdominal aortic normal caliber. No retroperitoneal periportal lymphadenopathy.   Musculoskeletal: No aggressive osseous lesion   IMPRESSION: 1. Stable cryoablation site in the upper pole of the RIGHT kidney. No growth or enhancement. 2. Stable bilateral Bosniak 1 renal cysts. 3. No adenopathy.     Electronically Signed   By: Suzy Bouchard M.D.   On: 05/01/2022 08:41  I have personally reviewed the images and agree with  radiologist interpretation.    Assessment & Plan:    1. Renal cell carcinoma - S/p cryoablation in 2020  -Will continue annual MRI surveilance, and recheck creatinine   - Monitor renal function  2.  Hydronephrosis/parapelvic cyst -Stable unchanged -No sequela remains asymptomatic and this will continue to follow on serial imaging  F/u 1 year with MRI  Conley Rolls as a scribe for Hollice Espy, MD.,have documented all relevant documentation on the behalf of Hollice Espy, MD,as directed by  Hollice Espy, MD while in the presence of Hollice Espy, [place] [address] [place], [place] [address] [phone] \

## 2022-09-15 DEATH — deceased

## 2022-10-20 ENCOUNTER — Ambulatory Visit: Payer: Medicare Other | Admitting: Oncology

## 2022-10-20 ENCOUNTER — Other Ambulatory Visit: Payer: Medicare Other

## 2023-05-05 ENCOUNTER — Ambulatory Visit: Payer: Medicaid Other | Admitting: Urology

## 2023-05-08 ENCOUNTER — Ambulatory Visit: Payer: Medicaid Other | Admitting: Urology
